# Patient Record
Sex: Female | Born: 1955 | Race: White | Hispanic: No | Marital: Married | Smoking: Never smoker
Health system: Southern US, Community
[De-identification: ages and names within clinical notes are randomized; demographics above are authoritative.]

## PROBLEM LIST (undated history)

## (undated) DIAGNOSIS — D333 Benign neoplasm of cranial nerves: Secondary | ICD-10-CM

## (undated) DIAGNOSIS — K219 Gastro-esophageal reflux disease without esophagitis: Secondary | ICD-10-CM

## (undated) DIAGNOSIS — F32A Depression, unspecified: Secondary | ICD-10-CM

## (undated) DIAGNOSIS — K5900 Constipation, unspecified: Secondary | ICD-10-CM

## (undated) DIAGNOSIS — I10 Essential (primary) hypertension: Secondary | ICD-10-CM

## (undated) DIAGNOSIS — F329 Major depressive disorder, single episode, unspecified: Secondary | ICD-10-CM

## (undated) DIAGNOSIS — R1310 Dysphagia, unspecified: Secondary | ICD-10-CM

## (undated) DIAGNOSIS — T7840XA Allergy, unspecified, initial encounter: Secondary | ICD-10-CM

## (undated) DIAGNOSIS — N979 Female infertility, unspecified: Secondary | ICD-10-CM

## (undated) DIAGNOSIS — D352 Benign neoplasm of pituitary gland: Secondary | ICD-10-CM

## (undated) DIAGNOSIS — M549 Dorsalgia, unspecified: Secondary | ICD-10-CM

## (undated) DIAGNOSIS — E785 Hyperlipidemia, unspecified: Secondary | ICD-10-CM

## (undated) DIAGNOSIS — E559 Vitamin D deficiency, unspecified: Secondary | ICD-10-CM

## (undated) DIAGNOSIS — F419 Anxiety disorder, unspecified: Secondary | ICD-10-CM

## (undated) DIAGNOSIS — M2550 Pain in unspecified joint: Secondary | ICD-10-CM

## (undated) HISTORY — DX: Hyperlipidemia, unspecified: E78.5

## (undated) HISTORY — DX: Essential (primary) hypertension: I10

## (undated) HISTORY — DX: Depression, unspecified: F32.A

## (undated) HISTORY — DX: Gastro-esophageal reflux disease without esophagitis: K21.9

## (undated) HISTORY — DX: Dysphagia, unspecified: R13.10

## (undated) HISTORY — DX: Major depressive disorder, single episode, unspecified: F32.9

## (undated) HISTORY — DX: Benign neoplasm of cranial nerves: D33.3

## (undated) HISTORY — DX: Female infertility, unspecified: N97.9

## (undated) HISTORY — DX: Anxiety disorder, unspecified: F41.9

## (undated) HISTORY — DX: Dorsalgia, unspecified: M54.9

## (undated) HISTORY — DX: Constipation, unspecified: K59.00

## (undated) HISTORY — DX: Allergy, unspecified, initial encounter: T78.40XA

## (undated) HISTORY — DX: Pain in unspecified joint: M25.50

## (undated) HISTORY — DX: Vitamin D deficiency, unspecified: E55.9

## (undated) HISTORY — DX: Benign neoplasm of pituitary gland: D35.2

---

## 1983-11-17 HISTORY — PX: BRAIN SURGERY: SHX531

## 2003-05-02 ENCOUNTER — Other Ambulatory Visit: Admission: RE | Admit: 2003-05-02 | Discharge: 2003-05-02 | Payer: Self-pay | Admitting: Family Medicine

## 2003-05-04 ENCOUNTER — Encounter: Payer: Self-pay | Admitting: Family Medicine

## 2003-05-04 ENCOUNTER — Encounter: Admission: RE | Admit: 2003-05-04 | Discharge: 2003-05-04 | Payer: Self-pay | Admitting: Family Medicine

## 2003-05-29 ENCOUNTER — Encounter: Payer: Self-pay | Admitting: Family Medicine

## 2003-05-29 ENCOUNTER — Ambulatory Visit (HOSPITAL_COMMUNITY): Admission: RE | Admit: 2003-05-29 | Discharge: 2003-05-29 | Payer: Self-pay | Admitting: Family Medicine

## 2005-03-23 ENCOUNTER — Ambulatory Visit: Payer: Self-pay | Admitting: Family Medicine

## 2005-03-25 ENCOUNTER — Ambulatory Visit: Payer: Self-pay | Admitting: Family Medicine

## 2005-04-03 ENCOUNTER — Ambulatory Visit: Payer: Self-pay | Admitting: *Deleted

## 2005-04-03 ENCOUNTER — Observation Stay (HOSPITAL_COMMUNITY): Admission: AD | Admit: 2005-04-03 | Discharge: 2005-04-04 | Payer: Self-pay | Admitting: Family Medicine

## 2005-04-03 ENCOUNTER — Ambulatory Visit: Payer: Self-pay | Admitting: Internal Medicine

## 2005-04-03 ENCOUNTER — Ambulatory Visit: Payer: Self-pay | Admitting: Family Medicine

## 2005-04-07 ENCOUNTER — Emergency Department (HOSPITAL_COMMUNITY): Admission: EM | Admit: 2005-04-07 | Discharge: 2005-04-07 | Payer: Self-pay | Admitting: Emergency Medicine

## 2005-04-09 ENCOUNTER — Ambulatory Visit: Payer: Self-pay

## 2005-04-14 ENCOUNTER — Ambulatory Visit: Payer: Self-pay | Admitting: Family Medicine

## 2005-04-28 ENCOUNTER — Ambulatory Visit: Payer: Self-pay

## 2005-07-29 ENCOUNTER — Ambulatory Visit: Payer: Self-pay | Admitting: Family Medicine

## 2005-10-26 ENCOUNTER — Ambulatory Visit: Payer: Self-pay | Admitting: Family Medicine

## 2005-12-23 ENCOUNTER — Ambulatory Visit: Payer: Self-pay | Admitting: Family Medicine

## 2006-07-20 ENCOUNTER — Ambulatory Visit: Payer: Self-pay | Admitting: Family Medicine

## 2007-03-08 DIAGNOSIS — D352 Benign neoplasm of pituitary gland: Secondary | ICD-10-CM

## 2007-03-08 DIAGNOSIS — I10 Essential (primary) hypertension: Secondary | ICD-10-CM | POA: Insufficient documentation

## 2007-03-08 DIAGNOSIS — D353 Benign neoplasm of craniopharyngeal duct: Secondary | ICD-10-CM

## 2007-05-11 ENCOUNTER — Ambulatory Visit: Payer: Self-pay | Admitting: Family Medicine

## 2007-05-11 ENCOUNTER — Ambulatory Visit: Payer: Self-pay | Admitting: Internal Medicine

## 2007-05-11 DIAGNOSIS — R109 Unspecified abdominal pain: Secondary | ICD-10-CM | POA: Insufficient documentation

## 2007-05-13 ENCOUNTER — Encounter (INDEPENDENT_AMBULATORY_CARE_PROVIDER_SITE_OTHER): Payer: Self-pay | Admitting: *Deleted

## 2007-05-13 LAB — CONVERTED CEMR LAB
ALT: 16 units/L (ref 0–35)
AST: 19 units/L (ref 0–37)
Basophils Relative: 0.1 % (ref 0.0–1.0)
Bilirubin, Direct: 0.1 mg/dL (ref 0.0–0.3)
CO2: 29 meq/L (ref 19–32)
Calcium: 9 mg/dL (ref 8.4–10.5)
Chloride: 100 meq/L (ref 96–112)
Eosinophils Relative: 1.2 % (ref 0.0–5.0)
GFR calc non Af Amer: 70 mL/min
Glucose, Bld: 86 mg/dL (ref 70–99)
Platelets: 221 10*3/uL (ref 150–400)
RBC: 4.45 M/uL (ref 3.87–5.11)
Total Protein: 6.5 g/dL (ref 6.0–8.3)
WBC: 5.5 10*3/uL (ref 4.5–10.5)

## 2007-06-07 ENCOUNTER — Telehealth: Payer: Self-pay | Admitting: Family Medicine

## 2007-12-12 ENCOUNTER — Ambulatory Visit: Payer: Self-pay | Admitting: Internal Medicine

## 2007-12-13 ENCOUNTER — Encounter: Payer: Self-pay | Admitting: Internal Medicine

## 2007-12-16 ENCOUNTER — Telehealth (INDEPENDENT_AMBULATORY_CARE_PROVIDER_SITE_OTHER): Payer: Self-pay | Admitting: *Deleted

## 2007-12-16 LAB — CONVERTED CEMR LAB
BUN: 12 mg/dL (ref 6–23)
Basophils Absolute: 0 10*3/uL (ref 0.0–0.1)
Creatinine, Ser: 0.9 mg/dL (ref 0.4–1.2)
Eosinophils Absolute: 0 10*3/uL (ref 0.0–0.6)
GFR calc Af Amer: 84 mL/min
GFR calc non Af Amer: 70 mL/min
Hemoglobin: 11.9 g/dL — ABNORMAL LOW (ref 12.0–15.0)
Iron: 52 ug/dL (ref 42–145)
MCHC: 32.2 g/dL (ref 30.0–36.0)
MCV: 78 fL (ref 78.0–100.0)
Monocytes Absolute: 0.4 10*3/uL (ref 0.2–0.7)
Monocytes Relative: 5.1 % (ref 3.0–11.0)
Potassium: 3.4 meq/L — ABNORMAL LOW (ref 3.5–5.1)
RDW: 14.3 % (ref 11.5–14.6)
Transferrin: 253.9 mg/dL (ref >=212.0)

## 2007-12-29 ENCOUNTER — Ambulatory Visit: Payer: Self-pay | Admitting: Family Medicine

## 2008-01-02 ENCOUNTER — Encounter (INDEPENDENT_AMBULATORY_CARE_PROVIDER_SITE_OTHER): Payer: Self-pay | Admitting: *Deleted

## 2008-01-02 LAB — CONVERTED CEMR LAB
CO2: 32 meq/L (ref 19–32)
Chloride: 101 meq/L (ref 96–112)
Creatinine, Ser: 0.8 mg/dL (ref 0.4–1.2)
Glucose, Bld: 93 mg/dL (ref 70–99)
Potassium: 4.1 meq/L (ref 3.5–5.1)
Sodium: 139 meq/L (ref 135–145)

## 2008-01-12 ENCOUNTER — Ambulatory Visit: Payer: Self-pay | Admitting: Family Medicine

## 2008-01-19 ENCOUNTER — Telehealth (INDEPENDENT_AMBULATORY_CARE_PROVIDER_SITE_OTHER): Payer: Self-pay | Admitting: *Deleted

## 2008-01-19 LAB — CONVERTED CEMR LAB
CO2: 30 meq/L (ref 19–32)
Chloride: 103 meq/L (ref 96–112)
Creatinine, Ser: 0.8 mg/dL (ref 0.4–1.2)
Potassium: 3.4 meq/L — ABNORMAL LOW (ref 3.5–5.1)
Sodium: 138 meq/L (ref 135–145)

## 2008-03-21 ENCOUNTER — Encounter: Payer: Self-pay | Admitting: Family Medicine

## 2008-06-13 ENCOUNTER — Ambulatory Visit: Payer: Self-pay | Admitting: Family Medicine

## 2008-06-13 DIAGNOSIS — H1045 Other chronic allergic conjunctivitis: Secondary | ICD-10-CM | POA: Insufficient documentation

## 2008-06-14 ENCOUNTER — Encounter (INDEPENDENT_AMBULATORY_CARE_PROVIDER_SITE_OTHER): Payer: Self-pay | Admitting: *Deleted

## 2008-06-14 LAB — CONVERTED CEMR LAB
BUN: 14 mg/dL (ref 6–23)
CO2: 30 meq/L (ref 19–32)
Calcium: 9.5 mg/dL (ref 8.4–10.5)
GFR calc Af Amer: 84 mL/min
Glucose, Bld: 91 mg/dL (ref 70–99)

## 2009-06-17 ENCOUNTER — Telehealth (INDEPENDENT_AMBULATORY_CARE_PROVIDER_SITE_OTHER): Payer: Self-pay | Admitting: *Deleted

## 2009-08-13 ENCOUNTER — Ambulatory Visit: Payer: Self-pay | Admitting: Family Medicine

## 2009-08-13 DIAGNOSIS — R42 Dizziness and giddiness: Secondary | ICD-10-CM

## 2009-08-13 DIAGNOSIS — J069 Acute upper respiratory infection, unspecified: Secondary | ICD-10-CM | POA: Insufficient documentation

## 2009-08-13 DIAGNOSIS — L723 Sebaceous cyst: Secondary | ICD-10-CM

## 2009-08-14 ENCOUNTER — Telehealth (INDEPENDENT_AMBULATORY_CARE_PROVIDER_SITE_OTHER): Payer: Self-pay | Admitting: *Deleted

## 2009-08-15 ENCOUNTER — Encounter: Admission: RE | Admit: 2009-08-15 | Discharge: 2009-08-15 | Payer: Self-pay | Admitting: Family Medicine

## 2009-08-19 ENCOUNTER — Encounter: Payer: Self-pay | Admitting: Family Medicine

## 2009-08-19 ENCOUNTER — Telehealth (INDEPENDENT_AMBULATORY_CARE_PROVIDER_SITE_OTHER): Payer: Self-pay | Admitting: *Deleted

## 2009-08-29 ENCOUNTER — Encounter: Payer: Self-pay | Admitting: Family Medicine

## 2009-09-17 ENCOUNTER — Encounter: Payer: Self-pay | Admitting: Family Medicine

## 2009-09-26 ENCOUNTER — Encounter: Payer: Self-pay | Admitting: Family Medicine

## 2009-10-15 ENCOUNTER — Encounter: Payer: Self-pay | Admitting: Family Medicine

## 2010-06-19 ENCOUNTER — Telehealth: Payer: Self-pay | Admitting: Family Medicine

## 2010-06-19 ENCOUNTER — Encounter: Payer: Self-pay | Admitting: Family Medicine

## 2010-06-25 ENCOUNTER — Telehealth (INDEPENDENT_AMBULATORY_CARE_PROVIDER_SITE_OTHER): Payer: Self-pay | Admitting: *Deleted

## 2010-06-25 ENCOUNTER — Ambulatory Visit: Payer: Self-pay | Admitting: Family Medicine

## 2010-06-25 DIAGNOSIS — M549 Dorsalgia, unspecified: Secondary | ICD-10-CM | POA: Insufficient documentation

## 2010-07-16 ENCOUNTER — Ambulatory Visit: Payer: Self-pay | Admitting: Family Medicine

## 2010-07-16 DIAGNOSIS — H6500 Acute serous otitis media, unspecified ear: Secondary | ICD-10-CM | POA: Insufficient documentation

## 2010-07-25 ENCOUNTER — Encounter: Payer: Self-pay | Admitting: Family Medicine

## 2010-08-13 ENCOUNTER — Emergency Department (HOSPITAL_COMMUNITY): Admission: EM | Admit: 2010-08-13 | Discharge: 2010-08-13 | Payer: Self-pay | Admitting: Emergency Medicine

## 2010-08-13 ENCOUNTER — Telehealth: Payer: Self-pay | Admitting: Family Medicine

## 2010-10-27 ENCOUNTER — Encounter: Payer: Self-pay | Admitting: Family Medicine

## 2010-12-14 LAB — CONVERTED CEMR LAB
ALT: 14 units/L (ref 0–35)
AST: 16 units/L (ref 0–37)
Alkaline Phosphatase: 61 units/L (ref 39–117)
BUN: 17 mg/dL (ref 6–23)
Basophils Absolute: 0 10*3/uL (ref 0.0–0.1)
Bilirubin, Direct: 0 mg/dL (ref 0.0–0.3)
Calcium: 9.3 mg/dL (ref 8.4–10.5)
Eosinophils Relative: 1.2 % (ref 0.0–5.0)
Folate: 10.2 ng/mL
GFR calc non Af Amer: 79.2 mL/min (ref >60)
Glucose, Bld: 105 mg/dL — ABNORMAL HIGH (ref 70–99)
HCT: 36.5 % (ref 36.0–46.0)
HDL: 35.1 mg/dL — ABNORMAL LOW (ref >39.00)
Lymphocytes Relative: 20.1 % (ref 12.0–46.0)
Monocytes Relative: 4.9 % (ref 3.0–12.0)
Platelets: 188 10*3/uL (ref 150.0–400.0)
Potassium: 3.2 meq/L — ABNORMAL LOW (ref 3.5–5.1)
RDW: 14.1 % (ref 11.5–14.6)
Sodium: 143 meq/L (ref 135–145)
Total Bilirubin: 0.5 mg/dL (ref 0.3–1.2)
Triglycerides: 285 mg/dL — ABNORMAL HIGH (ref 0.0–149.0)
VLDL: 57 mg/dL — ABNORMAL HIGH (ref 0.0–40.0)
Vitamin B-12: 218 pg/mL (ref 211–911)
WBC: 7.2 10*3/uL (ref 4.5–10.5)

## 2010-12-16 NOTE — Progress Notes (Signed)
Summary: REFILL REQUEST  Phone Note Refill Request Message from:  Pharmacy on June 25, 2010 9:51 AM  Refills Requested: Medication #1:  ULTRAM 50 MG TABS 1 by mouth q 6 hrn as needed.   Dosage confirmed as above?Dosage Confirmed   Supply Requested: 1 month   Last Refilled: 06/19/[facility] [address]  Next Appointment Scheduled: 07/01/10 Initial call taken by: Lavell Islam,  June 25, 2010 9:52 AM  Follow-up for Phone Call        LAST FILLED 06-19-10 #20, LAST ov 08-13-09................Marland KitchenFelecia Deloach CMA  June 25, 2010 12:15 PM   Additional Follow-up for Phone Call Additional follow up Details #1::        last refilled on 8/4 Additional Follow-up by: Loreen Freud DO,  June 25, 2010 12:19 PM    Additional Follow-up for Phone Call Additional follow up Details #2::    PER DR LOWNE PT WILL NEED TO BE SEEN FOR ANY ADDITIONAL REFILLS OF MED. PT AWARE.............Marland KitchenFelecia Deloach CMA  June 25, 2010 1:41 PM

## 2010-12-16 NOTE — Assessment & Plan Note (Signed)
Summary: BACK PAIN   Vital Signs:  Patient profile:   55 year old female Weight:      197 pounds Pulse rate:   84 / minute BP sitting:   130 / 86  (left arm)  Vitals Entered By: Almeta Monas CMA Duncan Dull) (June 25, 2010 2:59 PM) CC: pt c/o 7/10 sharp lbp that radiates down the (r) leg x1week   History of Present Illness: 55 yo woman here today for LBP.  sxs started 1 week ago.  'it feels like i have a herniated disc.  like somebody is taking my R leg and trying to rip it out of my hip joint'.  had MRI through chiropracter last week.  MRI results now in- L4/L5, S1/S2 degenerative changes w/ some disc involvement (pt forgets exact wording and did not bring report).  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  has appt w/ neurosurg pending.  pain is radiating down R buttock and upper thigh.  Current Medications (verified): 1)  Bromocriptine Mesylate 2.5 Mg Tabs (Bromocriptine Mesylate) .[name].. 1 By Mouth Qd 2)  Hydrochlorothiazide 25 Mg  Tabs (Hydrochlorothiazide) .[name].. 1 Once Daily 3)  Pepcid Otc .[name].. 1 Qd 4)  Lisinopril 10 Mg  Tabs (Lisinopril) .... 1/2  By Mouth Once Daily 5)  Klor-Con 20 Meq Pack (Potassium Chloride) .[name].. 1 By Mouth Once Daily 6)  Ultram 50 Mg Tabs (Tramadol Hcl) .[name].. 1 By Mouth Q 6 Hrn As Needed  Allergies (verified): No Known Drug Allergies  Review of Systems      See HPI  Physical Exam  General:  obviously uncomfortable Msk:  + TTP over lumbar spine good flexion, difficulty w/ extension (-) SLR bilaterally Extremities:  no C/C/E Neurologic:  shuffling, ataxic gait.   Impression & Recommendations:  Problem # 1:  BACK PAIN (ICD-724.5) Assessment New  pt's pain is likely disc related.  MRI done but no results available.  continue Ultram, add prednisone to decrease inflammation.  pt has upcoming appt w/ neurosurg.  offered vicodin, pt declined.  reviewed supportive care and red flags that should prompt return.  Pt  expresses understanding and is in agreement w/ this plan. Her updated medication list for this problem includes:    Ultram 50 Mg Tabs (Tramadol hcl) .[name]... 1 by mouth q 6 hrn as needed  Orders: Prescription Created Electronically [phone])  Complete Medication List: 1)  Bromocriptine Mesylate 2.5 Mg Tabs (Bromocriptine mesylate) .[name].. 1 by mouth qd 2)  Hydrochlorothiazide 25 Mg Tabs (Hydrochlorothiazide) .[name].. 1 once daily 3)  Pepcid Otc  .[name].. 1 qd 4)  Lisinopril 10 Mg Tabs (Lisinopril) .... 1/2  by mouth once daily 5)  Klor-con 20 Meq Pack (Potassium chloride) .[name].. 1 by mouth once daily 6)  Ultram 50 Mg Tabs (Tramadol hcl) .[name].. 1 by mouth q 6 hrn as needed 7)  Prednisone (pak) 10 Mg Tabs (Prednisone) .... Take as directed.  12 day pack  Patient Instructions: 1)  Please have a copy of the MRI and neurosurgery reports sent so we can follow along 2)  Take the Prednisone as directed- take w/ food to avoid upset stomach.  Avoid any additional ibuprofen, motrin, aleve.  You can add tylenol as needed. 3)  Use the tramadol as needed. 4)  Call with any questions or concerns 5)  Hang in there!! Prescriptions: ULTRAM 50 MG TABS (TRAMADOL HCL) 1 by mouth q 6 hrn as needed  #45 x 1   Entered and Authorized  by:   Neena Rhymes MD   Signed by:   Neena Rhymes MD on 06/25/2010   Method used:   [facility] to        [facility] [phone]* (retail)       [address]       [place], [place]  [address]       Ph: [phone] or [phone]       Fax: [phone]   RxID:   [id number] PREDNISONE (PAK) 10 MG TABS (PREDNISONE) take as directed.  12 day pack  #1 pack x 0   Entered and Authorized by:   Neena Rhymes MD   Signed by:   Neena Rhymes MD on 06/25/2010   Method used:   Electronically to        [facility] [phone]* (retail)       [address]       [place], [place]  [address]       Ph: [phone] or [phone]       Fax: [phone]   RxID:   [id number]

## 2010-12-16 NOTE — Progress Notes (Signed)
Summary: FYI-concerned about stroke-going to er  Phone Note Call from Patient   Caller: Patient Summary of Call: Spoke w/ patient husband says that she woke up this morning w/ L side facial weakness and that he was concerned about either Bell's Palsy or mini stroke informed husband that wife needs to go to emergency room asap to be evaluated to r/o stroke he agreed and will take patient to emergency room. Initial call taken by: Doristine Devoid CMA,  August 13, 2010 9:09 AM  Follow-up for Phone Call        please check on patient later today  Follow-up by: Loreen Freud DO,  August 13, 2010 11:32 AM  Additional Follow-up for Phone Call Additional follow up Details #1::        spoke w/ patient says she is doing fine she went to emergency room and they r/o stroke so Bell's palsy was what they suspect...Marland KitchenMarland KitchenDoristine Devoid CMA  August 13, 2010 4:40 PM

## 2010-12-16 NOTE — Assessment & Plan Note (Signed)
Summary: hearing loss/cbs   Vital Signs:  Patient profile:   55 year old female Height:      64 inches Weight:      194.4 pounds BMI:     33.49 Temp:     99.2 degrees F oral Pulse rate:   84 / minute Pulse rhythm:   regular BP sitting:   140 / 88  (left arm)  Vitals Entered By: Almeta Monas CMA Duncan Dull) (July 16, 2010 11:04 AM) CC: c/o ringing and hearing loss in the right ear   History of Present Illness: Pt here c/o decreased hearing in L ear.  It started in May and she thought it was related to congestion but it has not gotten better.  She still c/o congestion in L nostril and scratchy throat.  Pt was given pred pack for back pain and it helped a lot but when school started pain acted up again.  She has appointment with N/S next week but would like to repeat pred to see if it helps.    Current Medications (verified): 1)  Bromocriptine Mesylate 2.5 Mg Tabs (Bromocriptine Mesylate) .[name].. 1 By Mouth Qd 2)  Hydrochlorothiazide 25 Mg  Tabs (Hydrochlorothiazide) .[name].. 1 Once Daily 3)  Pepcid Otc .[name].. 1 Qd 4)  Lisinopril 10 Mg  Tabs (Lisinopril) .... 1/2  By Mouth Once Daily 5)  Klor-Con 20 Meq Pack (Potassium Chloride) .[name].. 1 By Mouth Once Daily 6)  Ultram 50 Mg Tabs (Tramadol Hcl) .[name].. 1 By Mouth Q 6 Hrn As Needed 7)  Prednisone (Pak) 10 Mg Tabs (Prednisone) .... Take As Directed.  12 Day Pack  Allergies (verified): No Known Drug Allergies  Physical Exam  General:  Well-developed,well-nourished,in no acute distress; alert,appropriate and cooperative throughout examination Ears:  L ear--+ fluid, dull TM Nose:  External nasal examination shows no deformity or inflammation. Nasal mucosa are pink and moist without lesions or exudates. Mouth:  pharyngeal erythema and postnasal drip.   Neck:  No deformities, masses, or tenderness noted. Psych:  Cognition and judgment appear intact. Alert and cooperative with normal attention span and concentration. No apparent delusions, illusions,  hallucinations   Impression & Recommendations:  Problem # 1:  OTITIS MEDIA, SEROUS, ACUTE, LEFT (ICD-381.01) prednisone taper veramyst  claritin refer to ENT if no better  Problem # 2:  BACK PAIN (ICD-724.5) repeat prednisone taper f/u neurosurgery Her updated medication list for this problem includes:    Ultram 50 Mg Tabs (Tramadol hcl) .[name]... 1 by mouth q 6 hrn as needed  Complete Medication List: 1)  Bromocriptine Mesylate 2.5 Mg Tabs (Bromocriptine mesylate) .[name].. 1 by mouth qd 2)  Hydrochlorothiazide 25 Mg Tabs (Hydrochlorothiazide) .[name].. 1 once daily 3)  Pepcid Otc  .[name].. 1 qd 4)  Lisinopril 10 Mg Tabs (Lisinopril) .... 1/2  by mouth once daily 5)  Klor-con 20 Meq Pack (Potassium chloride) .[name].. 1 by mouth once daily 6)  Ultram 50 Mg Tabs (Tramadol hcl) .[name].. 1 by mouth q 6 hrn as needed 7)  Prednisone (pak) 10 Mg Tabs (Prednisone) .... Take as directed.  12 day pack 8)  Veramyst 27.5 Mcg/spray Susp (Fluticasone furoate) .... 2 sprays each nostril once daily 9)  Claritin 10 Mg Tabs (Loratadine) .[name].. 1 by mouth once daily as needed  Patient Instructions: 1)  use veramyst and claritin for at least 2 weeks---call us if symptoms do not resolve and we will refer for ENT evaluation Prescriptions: CLARITIN 10 MG TABS (LORATADINE) 1 by mouth once daily as needed  #  30 x 5   Entered and Authorized by:   Loreen Freud DO   Signed by:   Loreen Freud DO on 07/16/2010   Method used:   Electronically to        [facility] [phone]* (retail)       [address]       [place], [place]  [address]       Ph: [phone] or [phone]       Fax: [phone]   RxID:   [id number] PREDNISONE (PAK) 10 MG TABS (PREDNISONE) take as directed.  12 day pack  #1 pack x 0   Entered and Authorized by:   Loreen Freud DO   Signed by:   Loreen Freud DO on 07/16/2010   Method used:   Electronically to        [facility] [phone]* (retail)       [address]       [place], [place]  [address]       Ph:  [phone] or [phone]       Fax: [phone]   RxID:   [id number]

## 2010-12-16 NOTE — Progress Notes (Signed)
Summary: RX request--back pain  Phone Note Call from Patient Call back at [phone]   Summary of Call: Patient called stating that she is seeing a chiropractor and was sent for an MRI and could not complete it b/c she could not lay still due to pain. Patient r/s the MRI to 2pm today and would like [facility]. Please advise. Initial call taken by: Lucious Groves CMA,  June 19, 2010 8:49 AM  Follow-up for Phone Call        ultram 50 mg 1 by mouth every 6 hours as needed  #20 Follow-up by: Loreen Freud DO,  June 19, 2010 10:52 AM  Additional Follow-up for Phone Call Additional follow up Details #1::        Patient notified. Additional Follow-up by: Lucious Groves CMA,  June 19, 2010 11:53 AM    New/Updated Medications: ULTRAM 50 MG TABS (TRAMADOL HCL) 1 by mouth q 6 hrn as needed Prescriptions: ULTRAM 50 MG TABS (TRAMADOL HCL) 1 by mouth q 6 hrn as needed  #20 x 0   Entered by:   Lucious Groves CMA   Authorized by:   Loreen Freud DO   Signed by:   Lucious Groves CMA on 06/19/2010   Method used:   Telephoned to ...       [facility] [phone]* (retail)       [address]       [place], [place]  [address]       Ph: [phone] or [phone]       Fax: [phone]   RxID:   [id number] ULTRAM 50 MG TABS (TRAMADOL HCL) 1 by mouth q 6 hrn as needed  #20 x 0   Entered by:   Lucious Groves CMA   Authorized by:   Loreen Freud DO   Signed by:   Lucious Groves CMA on 06/19/2010   Method used:   Historical   RxID:   [id number]

## 2010-12-16 NOTE — Letter (Signed)
Summary: [facility]  [facility]   Imported By: Lanelle Bal 08/08/2010 14:21:36  _____________________________________________________________________  External Attachment:    Type:   Image     Comment:   External Document

## 2010-12-18 NOTE — Letter (Signed)
Summary: Primary Care Appointment Letter  [facility]  [address] [place], [place] [address]   Phone: [phone]  Fax: [phone]       10/27/2010 MRN: [id number]  [facility] [address] [place], [place]  [address]       Dear Ms. Noon,   Your Primary Care Physician Loreen Freud DO has indicated that:    ___X____it is time to schedule an appointment for you annual physical.    _______you missed your appointment on______ and need to call and          reschedule.    __X_____you need to have lab work done/Fasting.    _______you need to schedule an appointment discuss lab or test results.    _______you need to call to reschedule your appointment that is                       scheduled on _________.     Please call our office as soon as possible. Our phone number is [phone]          _________. Please press option 1. Our office is open 8a-12noon and 1p-5p, Monday through Friday.     Thank you,    [facility] Scheduler

## 2011-02-10 ENCOUNTER — Encounter: Payer: Self-pay | Admitting: Family Medicine

## 2011-02-12 ENCOUNTER — Ambulatory Visit (INDEPENDENT_AMBULATORY_CARE_PROVIDER_SITE_OTHER): Payer: Medicare Other | Admitting: Family Medicine

## 2011-02-12 ENCOUNTER — Encounter: Payer: Self-pay | Admitting: Family Medicine

## 2011-02-12 DIAGNOSIS — F341 Dysthymic disorder: Secondary | ICD-10-CM

## 2011-02-12 DIAGNOSIS — H9192 Unspecified hearing loss, left ear: Secondary | ICD-10-CM | POA: Insufficient documentation

## 2011-02-12 DIAGNOSIS — R21 Rash and other nonspecific skin eruption: Secondary | ICD-10-CM

## 2011-02-12 DIAGNOSIS — H9190 Unspecified hearing loss, unspecified ear: Secondary | ICD-10-CM

## 2011-02-12 DIAGNOSIS — F418 Other specified anxiety disorders: Secondary | ICD-10-CM | POA: Insufficient documentation

## 2011-02-12 MED ORDER — BROMOCRIPTINE MESYLATE 2.5 MG PO TABS: 2.5000 mg | ORAL_TABLET | Freq: Every day | ORAL | Status: DC

## 2011-02-12 MED ORDER — HYDROCHLOROTHIAZIDE 25 MG PO TABS: 25.0000 mg | ORAL_TABLET | Freq: Every day | ORAL | Status: DC

## 2011-02-12 MED ORDER — LISINOPRIL 10 MG PO TABS: 5.0000 mg | ORAL_TABLET | ORAL | Status: DC

## 2011-02-12 NOTE — Assessment & Plan Note (Signed)
Pt obviously under a lot of stress.  Tearful throughout most of visit.  Not interested in starting meds but open to idea of counseling.  Admits this has been a problem for 'years' but has never talked about it w/ anyone.  States 'it's getting harder to ignore'.  List of counseling providers and #s given.  Strongly encouraged her to call for appt

## 2011-02-12 NOTE — Assessment & Plan Note (Signed)
Appearance consistent w/ erythema nodosum but areas are not painful.  As the 'spots' are not bothersome to pt will not treat at this time.  Reviewed supportive care and red flags that should prompt return.  Pt expressed understanding and is in agreement w/ plan.[name]

## 2011-02-12 NOTE — Progress Notes (Signed)
  Subjective:    Patient ID: Kristin Delgado, female    DOB: [birth year], 55 y.o.   MRN: [id number]  HPI Hearing loss- L sided, first noticed last summer.  Initially thought she had water in her ear but sxs never improved.  There was free hearing test offered at [facility] on 1/31 which showed marked hearing loss on L- w/ hand written note indicating they were unable to get true response at low frequency.  Has been using ear buds but has to turn volume the 'whole way up' and often hears 'static and squeaks'.  Red bumps- forearms and lower legs.  First appeared 2-3 weeks ago.  Not painful/itching.  Will 'come and go'.  No one at home w/ similar sxs.  No lesions on trunk.  'can you have hives if they don't itch?'  No recent travel or exposure to new or different materials.  Nothing makes sxs better or worse.  Depression/anxiety- pt tearful in office today, reports most of her stress stems from financial issues 'which i can't do anything about'.  Reports this has been going on 'for years'.  Had ruptured disc in Aug 2011- has had difficult time w/ this.  Has never talked about depression previously.  Not open to idea of meds.  Open to idea of counseling.   Review of Systems For ROS see HPI     Objective:   Physical Exam  Constitutional: She appears well-developed and well-nourished.  HENT:  Head: Normocephalic and atraumatic.  Right Ear: Tympanic membrane and external ear normal.  Left Ear: Tympanic membrane and external ear normal. Decreased hearing is noted.  Nose: Nose normal.  Mouth/Throat: Oropharynx is clear and moist.  Eyes: Conjunctivae and EOM are normal. Pupils are equal, round, and reactive to light.  Neck: Normal range of motion. Neck supple.  Lymphadenopathy:    She has no cervical adenopathy.  Skin: Skin is warm and dry.       Faintly erythematous areas on forearms and lower legs bilaterally.  ? Nodularity.  Some blanching but not completely.  Non tender, non pruritic.    Psychiatric: Her speech is normal. Thought content normal. Her mood appears anxious. She is withdrawn. Cognition and memory are normal. She exhibits a depressed mood.       tearful          Assessment & Plan:

## 2011-02-12 NOTE — Patient Instructions (Signed)
Please schedule your complete physical w/ Dr Laury Axon in the next 6-12 weeks (do not eat before this appt) Someone will call you with your Audiology appt The red spots on your skin may be stress related- since they aren't bothering you we won't treat them, but call if they worsen or start to be symptomatic Consider calling and starting counseling- this would help the depression and anxiety Call with any questions or concerns Hang in there!

## 2011-02-12 NOTE — Assessment & Plan Note (Signed)
Given results obtained at [facility] agree that pt needs formal/extension audiologic testing.  Will refer.

## 2011-03-09 ENCOUNTER — Other Ambulatory Visit: Payer: Self-pay | Admitting: Otolaryngology

## 2011-03-12 ENCOUNTER — Ambulatory Visit
Admission: RE | Admit: 2011-03-12 | Discharge: 2011-03-12 | Disposition: A | Discharge status: Home / Self Care | Payer: Medicare Other | Source: Ambulatory Visit | Attending: Otolaryngology | Admitting: Otolaryngology

## 2011-03-12 MED ORDER — GADOBENATE DIMEGLUMINE 529 MG/ML IV SOLN
20.0000 mL | Freq: Once | INTRAVENOUS | Status: AC | PRN
  Administered 2011-03-12: 20 mL via INTRAVENOUS

## 2011-04-03 NOTE — H&P (Signed)
NAME:  Kristin Delgado, Kristin Delgado NO.:  [id number]   MEDICAL RECORD NO.:  [id number]          PATIENT TYPE:  INP   LOCATION:  4730                         FACILITY:  [facility]   PHYSICIAN:  Lelon Perla, M.D.  DATE OF BIRTH:  01-02-1956   DATE OF ADMISSION:  04/03/2005  DATE OF DISCHARGE:                                HISTORY & PHYSICAL   ADMITTING DIAGNOSES:  1.  Chest pain.  2.  Uncontrolled hypertension.  3.  Rule out myocardial infarction.   Patient is a 55 year old white female who presented to the office with  atypical chest pain and left arm and neck numbness since this morning.  States she was checking her blood pressure at home and has been very high.  She also complains of nausea.  No shortness of breath.  No diaphoresis.  Now  in the office pain seems to have subsided and she has had no headache or  mental status changes.   PAST MEDICAL HISTORY:  1.  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  2.  Hypertension that was diagnosed in May of this year.   FAMILY HISTORY:  Denies.   SOCIAL HISTORY:  Denies any smoking or alcohol.  She is married.   ALLERGIES:  No known drug allergies.   MEDICATIONS:  Only taking Parlodel 2.5 mg and HCTZ 25 mg once a day that was  started on Mar 26, 2005.   PHYSICAL EXAMINATION:  VITAL SIGNS:  Blood pressure left arm 200/100, right  arm 190/110, pulse 86, respirations 16.  She is afebrile.  GENERAL:  She is awake, alert, oriented, in no acute distress, but very  anxious.  HEENT:  Head is normocephalic, atraumatic.  Eyes:  Pupils are equal, round,  and reactive to light.  Tympanic membranes are intact.  Oropharynx is clear.  NECK:  Supple.  No JVD or bruits.  HEART:  Positive S1, S2 with a 1-2/6 systolic ejection murmur.  LUNGS:  Clear to auscultation bilaterally.  No rales, rhonchi, or wheezing.  ABDOMEN:  Soft, nontender.  EXTREMITIES:  No edema.  NEUROLOGIC:  No focal deficits.  Cranial nerves II-XII are  intact.  EKG  showed no acute changes and sinus rhythm.  Patient had echocardiogram May 15, 2003 showed normal LV function, mild AI, MR, and TR.   ASSESSMENT/PLAN:  1.  Chest pain.  Uncontrolled hypertension.  Rule out myocardial infarction.      Patient is being admitted for 24-hour      observation to rule out myocardial infarction.  Will check CPK x3.  Add      Toprol XL 50 mg a day and consult cardiology.  2.  History of pituitary tumor.  Patient is on Parlodel.  Will check a      prolactin level while she is in hospital.      YRL/MEDQ  D:  04/03/2005  T:  04/03/2005  Job:  [id number]

## 2011-04-03 NOTE — Consult Note (Signed)
NAME:  Kristin Delgado, Kristin Delgado NO.:  [id number]   MEDICAL RECORD NO.:  [id number]          PATIENT TYPE:  INP   LOCATION:  4730                         FACILITY:  [facility]   PHYSICIAN:  Rollene Rotunda, M.D.   DATE OF BIRTH:  03-24-1956   DATE OF CONSULTATION:  DATE OF DISCHARGE:                                   CONSULTATION   Audio too short to transcribe (less than 5 seconds)      JH/MEDQ  D:  04/03/2005  T:  04/03/2005  Job:  [id number]

## 2011-04-03 NOTE — Discharge Summary (Signed)
NAME:  Kristin Delgado, Kristin Delgado                ACCOUNT NO.:  [id number]   MEDICAL RECORD NO.:  [id number]          PATIENT TYPE:  INP   LOCATION:  4730                         FACILITY:  [facility]   PHYSICIAN:  Bruce Rexene Edison. Swords, M.D. [facility] OF BIRTH:  [birth year]   DATE OF ADMISSION:  04/03/2005  DATE OF DISCHARGE:  04/04/2005                                 DISCHARGE SUMMARY   DISCHARGE DIAGNOSES:  1.  Atypical chest pain.  2.  Pituitary tumor (__________).  3.  Hypertension.   DISCHARGE MEDICATIONS:  1.  _______________2.5 mg p.o. daily.  2.  Hydrochlorothiazide 25 mg p.o. daily.   CONDITION ON DISCHARGE:  Improved.   FOLLOWUP:  He will see Dr. Laury Axon in one to two weeks.  She will be scheduled  for a stress Myoview.   HOSPITAL COURSE:  The patient was admitted to the hospitalist service with  atypical chest pain.  The patient was seen in consultation by cardiology.  All of her cardiac enzymes were negative, including a troponin on the day of  discharge.  The patient was seen by cardiologist who recommended a stress  Myoview that will be done as an outpatient.       BHS/MEDQ  D:  04/04/2005  T:  04/04/2005  Job:  [id number]   cc:   Salvadore Farber, M.D. [facility]  [address]  [address]  [place]  [place] [address]   Loreen Freud, M.D.

## 2011-04-03 NOTE — Consult Note (Signed)
NAME:  Kristin Delgado, BIN NO.:  [id number]   MEDICAL RECORD NO.:  [id number]          PATIENT TYPE:  INP   LOCATION:  4730                         FACILITY:  [facility]   PHYSICIAN:  Rollene Rotunda, M.D.   DATE OF BIRTH:  [birth year]   DATE OF CONSULTATION:  DATE OF DISCHARGE:                                   CONSULTATION   DATE OF CONSULTATION:  Apr 03, 2005.   REASON FOR CONSULTATION:  Evaluate patient with neck discomfort.   HISTORY OF PRESENT ILLNESS:  The patient is a pleasant 55 year old without  history of coronary disease.  She does report a stress perfusion study a  couple of years ago, which she reports as normal.  She has recently  diagnosed hypertension.  Today, after running some errands, she was lying on  the couch.  She developed some discomfort in her throat that she thought  might have been a sore throat.  However, she then developed some tingling in  her jaw.  This was moderate and annoying.  She said she felt like a lump  in her throat.  She did have some bilateral shoulder aching, but says this  is a chronic problem.  She also had some left elbow discomfort, but again  says this is a chronic problem.  Her face felt hot.  She had some numbness  in the palm of her hand.  She presented to Dr. Laury Axon after a couple of hours  of this.  She was noted to have a blood pressure of 200/100 and was now  admitted.   The patient reports that she is active doing household chores,z so she does  not exercise.  With it, she denies any chest discomfort, neck discomfort,  arm discomfort, activity, nausea, vomiting, or excessive diaphoresis.  She  does not have any palpitations, presyncope, or syncope.  She denies any PND  or orthopnea.   PAST MEDICAL HISTORY:  1.  Hypertension recently diagnosed.  2.  Pituitary adenoma.   PAST SURGICAL HISTORY:  Resection of adenoma.   ALLERGIES:  None.   MEDICATIONS:  1.  Hydrochlorothiazide 20 mg daily.  2.  Parlodel 2.5 mg  daily taken with Tums.   SOCIAL HISTORY:  The patient lives in [place] with her husband.  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  She has three children.  She has never smoked cigarettes and  does not drink alcohol.   FAMILY HISTORY:  Noncontributory for early coronary artery disease.   REVIEW OF SYSTEMS:  Positive for mild lower extremity swelling,  gastroesophageal reflux symptoms.  Otherwise, negative for all other  systems.   PHYSICAL EXAMINATION:  GENERAL:  The patient is in no distress.  VITAL SIGNS:  Blood pressure 143/115, heart rate 72, afebrile.  HEENT:  Eyelids unremarkable.  Pupils equal, round, and reactive  to light.  Fundi not visualized.  Oral mucosa unremarkable.  NECK:  No jugulovenous distention.  Wave form within normal limits.  Carotid  upstroke brisk and symmetric with no bruits, no thyromegaly.  LYMPHATICS:  No cervical, axillary, or inguinal adenopathy.  LUNGS:  Clear to  auscultation bilaterally.  BACK:  No costovertebral angle tenderness.  CHEST:  Unremarkable.  HEART:  S1 and S2 within normal limits.  No S3, no S4, no murmurs.  ABDOMEN:  Obese, positive bowel sounds normal in frequency and pitch, no  bruits, rebound, guarding, no midline pulsatile masses, no hepatomegaly, no  splenomegaly.  SKIN:  No rashes, no nodules.  EXTREMITIES:  2+ pulses throughout, no edema, no cyanosis, no clubbing.  NEURO:  Oriented to person, place, and time, cranial nerves II-XII grossly  intact, motor grossly intact.   ELECTROCARDIOGRAM:  Sinus rhythm, rate 75, axis within normal limits,  intervals within normal limits, poor anterior R-wave progression, no acute  ST T-wave changes.   ASSESSMENT AND PLAN:  1.  Throat/chin discomfort:  This sounds atypical for angina.  She reports a      negative stress perfusion study a couple of years ago.  She does have      hypertension as a risk factor, but no others.  Her physical exam is      unremarkable.  Her EKG is unremarkable.  Labs are  pending.  Based on all      of this, I think the pre-test probability of obstructive coronary      disease causing these symptoms is low.  I think she could be ruled out      for myocardial infarction.  I think an outpatient stress test would be      indicated.  2.  Risk reduction:  She will have a lipid profile.  3.  Hypertension:  The patient will be started on Toprol and get her first      dose today.  She will continue on her hydrochlorothiazide.  Medications      will be adjusted based on her response to this.  4.  Obesity:  She will be counseled about the need to lose weight with diet      and exercise.      JH/MEDQ  D:  04/03/2005  T:  04/04/2005  Job:  [id number]

## 2011-04-29 ENCOUNTER — Other Ambulatory Visit: Payer: Self-pay | Admitting: Family Medicine

## 2011-06-10 ENCOUNTER — Ambulatory Visit (INDEPENDENT_AMBULATORY_CARE_PROVIDER_SITE_OTHER): Payer: Medicare Other | Admitting: Family Medicine

## 2011-06-10 ENCOUNTER — Encounter: Payer: Self-pay | Admitting: Family Medicine

## 2011-06-10 VITALS — BP 122/70 | HR 58 | Temp 98.6°F | Ht 64.0 in | Wt 205.0 lb

## 2011-06-10 DIAGNOSIS — D352 Benign neoplasm of pituitary gland: Secondary | ICD-10-CM

## 2011-06-10 DIAGNOSIS — Z23 Encounter for immunization: Secondary | ICD-10-CM

## 2011-06-10 DIAGNOSIS — Z0000 Encounter for general adult medical examination without abnormal findings: Secondary | ICD-10-CM

## 2011-06-10 DIAGNOSIS — T148XXA Other injury of unspecified body region, initial encounter: Secondary | ICD-10-CM

## 2011-06-10 DIAGNOSIS — IMO0002 Reserved for concepts with insufficient information to code with codable children: Secondary | ICD-10-CM

## 2011-06-10 DIAGNOSIS — I10 Essential (primary) hypertension: Secondary | ICD-10-CM

## 2011-06-10 LAB — CBC WITH DIFFERENTIAL/PLATELET
Basophils Absolute: 0 10*3/uL (ref 0.0–0.1)
HCT: 37.3 % (ref 36.0–46.0)
Hemoglobin: 12.2 g/dL (ref 12.0–15.0)
Lymphs Abs: 1.5 10*3/uL (ref 0.7–4.0)
MCV: 80.7 fl (ref 78.0–100.0)
Monocytes Absolute: 0.3 10*3/uL (ref 0.1–1.0)
Monocytes Relative: 5.1 % (ref 3.0–12.0)
Neutro Abs: 4.4 10*3/uL (ref 1.4–7.7)
RDW: 16.5 % — ABNORMAL HIGH (ref 11.5–14.6)

## 2011-06-10 LAB — HEPATIC FUNCTION PANEL
ALT: 25 U/L (ref 0–35)
AST: 23 U/L (ref 0–37)
Albumin: 4.3 g/dL (ref 3.5–5.2)

## 2011-06-10 LAB — BASIC METABOLIC PANEL
BUN: 21 mg/dL (ref 6–23)
CO2: 30 mEq/L (ref 19–32)
Chloride: 97 mEq/L (ref 96–112)
GFR: 66.13 mL/min (ref >60.00)
Glucose, Bld: 101 mg/dL — ABNORMAL HIGH (ref 70–99)
Potassium: 3.4 mEq/L — ABNORMAL LOW (ref 3.5–5.1)
Sodium: 138 mEq/L (ref 135–145)

## 2011-06-10 LAB — LIPID PANEL
Cholesterol: 259 mg/dL — ABNORMAL HIGH (ref 0–200)
Triglycerides: 422 mg/dL — ABNORMAL HIGH (ref 0.0–149.0)
VLDL: 84.4 mg/dL — ABNORMAL HIGH (ref 0.0–40.0)

## 2011-06-10 NOTE — Patient Instructions (Signed)

## 2011-06-10 NOTE — Progress Notes (Signed)
Subjective:     Kristin Delgado is a 55 y.o. female and is here for a comprehensive physical exam. The patient reports no problems.  History   Social History  . Marital Status: Married    Spouse Name: N/A    Number of Children: N/A  . Years of Education: N/A   Occupational History  . Not on file.   Social History Main Topics  . Smoking status: Never Smoker   . Smokeless tobacco: Not on file  . Alcohol Use: Yes     rare  . Drug Use: No  . Sexually Active:    Other Topics Concern  . Not on file   Social History Narrative  . No narrative on file   Health Maintenance  Topic Date Due  . Pap Smear  10/14/1972  . Tetanus/tdap  10/14/1973  . Mammogram  10/14/2004  . Colonoscopy  10/14/2004  . Influenza Vaccine  08/17/2011    The following portions of the patient's history were reviewed and updated as appropriate: allergies, current medications, past family history, past medical history, past social history, past surgical history and problem list.  Review of Systems  Review of Systems  Constitutional: Negative for activity change, appetite change and fatigue.  HENT: , congestion, tinnitus and ear discharge.  Dentist--q79m,   +  Hearing loss L ear Eyes: Negative for visual disturbance (see optho q1y -- vision corrected to 20/20 with glasses).  Respiratory: Negative for cough, chest tightness and shortness of breath.   Cardiovascular: Negative for chest pain, palpitations and leg swelling.  Gastrointestinal: Negative for abdominal pain, diarrhea, constipation and abdominal distention.  Genitourinary: Negative for urgency, frequency, decreased urine volume and difficulty urinating.  Musculoskeletal: Negative for back pain, arthralgias and gait problem.  Skin: Negative for color change, pallor and rash. -- cut on thumb from saran wrap cutter Neurological: Negative for dizziness, light-headedness, numbness and headaches.  Hematological: Negative for adenopathy. Does not  bruise/bleed easily.  Psychiatric/Behavioral: Negative for suicidal ideas, confusion, sleep disturbance, self-injury, dysphoric mood, decreased concentration and agitation.  Pt is able to read and write and can do all ADLs No risk for falling No abuse/ violence in home    Objective:    BP 122/70  Pulse 58  Temp(Src) 98.6 F (37 C) (Oral)  Ht 5\' 4"  (1.626 m)  Wt 205 lb (92.987 kg)  BMI 35.19 kg/m2  SpO2 95%  LMP 06/10/2011 General appearance: alert, cooperative, appears stated age and no distress Head: Normocephalic, without obvious abnormality, atraumatic Eyes: conjunctivae/corneas clear. PERRL, EOM's intact. Fundi benign. Ears: normal TM's and external ear canals both ears Nose: Nares normal. Septum midline. Mucosa normal. No drainage or sinus tenderness. Throat: lips, mucosa, and tongue normal; teeth and gums normal Neck: no adenopathy, no carotid bruit, no JVD, supple, symmetrical, trachea midline and thyroid not enlarged, symmetric, no tenderness/mass/nodules Back: symmetric, no curvature. ROM normal. No CVA tenderness. Lungs: clear to auscultation bilaterally Breasts: normal appearance, no masses or tenderness Heart: regular rate and rhythm, S1, S2 normal, no murmur, click, rub or gallop Abdomen: soft, non-tender; bowel sounds normal; no masses,  no organomegaly Pelvic: deferred at pt preference Extremities: extremities normal, atraumatic, no cyanosis or edema Pulses: 2+ and symmetric Skin: Skin color, texture, turgor normal. No rashes or lesions--+ superficial cut L thumb Lymph nodes: Cervical, supraclavicular, and axillary nodes normal. Neurologic: Alert and oriented X 3, normal strength and tone. Normal symmetric reflexes. Normal coordination and gait psych--no depression or anxiety    Assessment:  Healthy female exam.  Brain tumor---per NS at [facility] July 03, 2011 HTN Hx microadenoma---check labs     Plan:  [facility]hm utd Check labs rto 6 months or sooner  prn   See After Visit Summary for Counseling Recommendations

## 2011-06-17 ENCOUNTER — Telehealth: Payer: Self-pay

## 2011-06-17 ENCOUNTER — Telehealth: Payer: Self-pay | Admitting: *Deleted

## 2011-06-17 ENCOUNTER — Ambulatory Visit
Admission: RE | Admit: 2011-06-17 | Discharge: 2011-06-17 | Disposition: A | Discharge status: Home / Self Care | Payer: Medicare Other | Source: Ambulatory Visit | Attending: Family Medicine | Admitting: Family Medicine

## 2011-06-17 DIAGNOSIS — Z0000 Encounter for general adult medical examination without abnormal findings: Secondary | ICD-10-CM

## 2011-06-17 DIAGNOSIS — Z1231 Encounter for screening mammogram for malignant neoplasm of breast: Secondary | ICD-10-CM

## 2011-06-17 MED ORDER — FENOFIBRATE 160 MG PO TABS: 160.0000 mg | ORAL_TABLET | Freq: Every day | ORAL | Status: DC

## 2011-06-17 NOTE — Telephone Encounter (Signed)
Discuss with Pt copy of labs mailed, Rx sent in to pharmacy., potassium rich diet enclosed.

## 2011-06-17 NOTE — Telephone Encounter (Signed)
con't parlodel Cholesterol--- LDL goal < 100, HDL >40, TG < 150. Diet and exercise will increase HDL and decrease LDL and TG. Fish, Fish Oil, Flaxseed oil will also help increase the HDL and decrease Triglycerides. Recheck labs in 3.----- Start fenofibrate 160 mg 1 po qd ,#30, 2 refills 272.4 Lipid, bmp k low-----increase K supplementation by 1 a day  272.4 401.9 Lipid, hep, bmp    Multiple attempts to contact patient---Letter mailed     KP

## 2011-07-03 HISTORY — PX: OTHER SURGICAL HISTORY: SHX169

## 2011-07-29 ENCOUNTER — Ambulatory Visit (AMBULATORY_SURGERY_CENTER): Payer: Medicare Other | Admitting: *Deleted

## 2011-07-29 VITALS — Ht 64.0 in | Wt 204.0 lb

## 2011-07-29 DIAGNOSIS — Z1211 Encounter for screening for malignant neoplasm of colon: Secondary | ICD-10-CM

## 2011-07-29 MED ORDER — SUPREP BOWEL PREP KIT 17.5-3.13-1.6 GM/177ML PO SOLN: 1.0000 | ORAL | Status: DC

## 2011-08-12 ENCOUNTER — Ambulatory Visit (AMBULATORY_SURGERY_CENTER): Payer: Medicare Other | Admitting: Gastroenterology

## 2011-08-12 ENCOUNTER — Encounter: Payer: Self-pay | Admitting: Gastroenterology

## 2011-08-12 VITALS — BP 144/80 | HR 65 | Temp 97.6°F | Resp 20 | Ht 64.0 in | Wt 204.0 lb

## 2011-08-12 DIAGNOSIS — Z1211 Encounter for screening for malignant neoplasm of colon: Secondary | ICD-10-CM

## 2011-08-12 MED ORDER — SODIUM CHLORIDE 0.9 % IV SOLN: 500.0000 mL | INTRAVENOUS | Status: DC

## 2011-08-13 ENCOUNTER — Telehealth: Payer: Self-pay

## 2011-08-13 NOTE — Telephone Encounter (Signed)
No ID on answering machine. 

## 2011-11-21 ENCOUNTER — Other Ambulatory Visit: Payer: Self-pay | Admitting: Family Medicine

## 2011-11-23 NOTE — Telephone Encounter (Signed)
Letter mailed     KP 

## 2012-01-26 ENCOUNTER — Other Ambulatory Visit: Payer: Self-pay | Admitting: Family Medicine

## 2012-05-24 ENCOUNTER — Other Ambulatory Visit: Payer: Self-pay | Admitting: Family Medicine

## 2012-05-25 ENCOUNTER — Other Ambulatory Visit: Payer: Self-pay | Admitting: *Deleted

## 2012-05-25 MED ORDER — LISINOPRIL 10 MG PO TABS: ORAL_TABLET | ORAL | Status: DC

## 2012-05-25 NOTE — Telephone Encounter (Signed)
Request for lisnopril 10mg  #90 noted incoming fax-noted as Tabori on fax, however noted as [place] pt please advise per MD protocol for recent labs/OV

## 2012-06-14 ENCOUNTER — Ambulatory Visit (INDEPENDENT_AMBULATORY_CARE_PROVIDER_SITE_OTHER): Payer: Medicare Other | Admitting: Family Medicine

## 2012-06-14 ENCOUNTER — Encounter: Payer: Self-pay | Admitting: Family Medicine

## 2012-06-14 VITALS — BP 132/76 | HR 62 | Temp 98.1°F | Ht 64.5 in | Wt 202.6 lb

## 2012-06-14 DIAGNOSIS — D353 Benign neoplasm of craniopharyngeal duct: Secondary | ICD-10-CM

## 2012-06-14 DIAGNOSIS — R011 Cardiac murmur, unspecified: Secondary | ICD-10-CM | POA: Insufficient documentation

## 2012-06-14 DIAGNOSIS — Z1239 Encounter for other screening for malignant neoplasm of breast: Secondary | ICD-10-CM

## 2012-06-14 DIAGNOSIS — Z0000 Encounter for general adult medical examination without abnormal findings: Secondary | ICD-10-CM

## 2012-06-14 DIAGNOSIS — I10 Essential (primary) hypertension: Secondary | ICD-10-CM

## 2012-06-14 DIAGNOSIS — E785 Hyperlipidemia, unspecified: Secondary | ICD-10-CM

## 2012-06-14 DIAGNOSIS — N390 Urinary tract infection, site not specified: Secondary | ICD-10-CM

## 2012-06-14 DIAGNOSIS — D352 Benign neoplasm of pituitary gland: Secondary | ICD-10-CM

## 2012-06-14 LAB — CBC WITH DIFFERENTIAL/PLATELET
Basophils Absolute: 0 10*3/uL (ref 0.0–0.1)
Eosinophils Relative: 1.5 % (ref 0.0–5.0)
HCT: 34.9 % — ABNORMAL LOW (ref 36.0–46.0)
Lymphs Abs: 1.6 10*3/uL (ref 0.7–4.0)
MCV: 79.7 fl (ref 78.0–100.0)
Monocytes Absolute: 0.3 10*3/uL (ref 0.1–1.0)
Neutrophils Relative %: 65.8 % (ref 43.0–77.0)
Platelets: 183 10*3/uL (ref 150.0–400.0)
RDW: 15.2 % — ABNORMAL HIGH (ref 11.5–14.6)
WBC: 5.8 10*3/uL (ref 4.5–10.5)

## 2012-06-14 LAB — POCT URINALYSIS DIPSTICK
Bilirubin, UA: NEGATIVE
Blood, UA: NEGATIVE
Ketones, UA: NEGATIVE
Spec Grav, UA: 1.02
pH, UA: 6

## 2012-06-14 LAB — BASIC METABOLIC PANEL
BUN: 21 mg/dL (ref 6–23)
Chloride: 103 mEq/L (ref 96–112)
Creatinine, Ser: 0.9 mg/dL (ref 0.4–1.2)
GFR: 69.32 mL/min (ref >60.00)
Glucose, Bld: 104 mg/dL — ABNORMAL HIGH (ref 70–99)
Potassium: 3.7 mEq/L (ref 3.5–5.1)

## 2012-06-14 LAB — HEPATIC FUNCTION PANEL
Bilirubin, Direct: 0 mg/dL (ref 0.0–0.3)
Total Bilirubin: 0.4 mg/dL (ref 0.3–1.2)

## 2012-06-14 LAB — LIPID PANEL
Cholesterol: 216 mg/dL — ABNORMAL HIGH (ref 0–200)
HDL: 39.9 mg/dL (ref >39.00)
Total CHOL/HDL Ratio: 5
Triglycerides: 255 mg/dL — ABNORMAL HIGH (ref 0.0–149.0)
VLDL: 51 mg/dL — ABNORMAL HIGH (ref 0.0–40.0)

## 2012-06-14 LAB — LDL CHOLESTEROL, DIRECT: Direct LDL: 116.6 mg/dL

## 2012-06-14 NOTE — Assessment & Plan Note (Signed)
Stable con't meds 

## 2012-06-14 NOTE — Addendum Note (Signed)
Addended by: Silvio Pate D on: 06/14/2012 10:30 AM   Modules accepted: Orders

## 2012-06-14 NOTE — Patient Instructions (Addendum)
Preventive Care for Adults, Female A healthy lifestyle and preventive care can promote health and wellness. Preventive health guidelines for women include the following key practices.  A routine yearly physical is a good way to check with your caregiver about your health and preventive screening. It is a chance to share any concerns and updates on your health, and to receive a thorough exam.   Visit your dentist for a routine exam and preventive care every 6 months. Brush your teeth twice a day and floss once a day. Good oral hygiene prevents tooth decay and gum disease.   The frequency of eye exams is based on your age, health, family medical history, use of contact lenses, and other factors. Follow your caregiver's recommendations for frequency of eye exams.   Eat a healthy diet. Foods like vegetables, fruits, whole grains, low-fat dairy products, and lean protein foods contain the nutrients you need without too many calories. Decrease your intake of foods high in solid fats, added sugars, and salt. Eat the right amount of calories for you.Get information about a proper diet from your caregiver, if necessary.   Regular physical exercise is one of the most important things you can do for your health. Most adults should get at least 150 minutes of moderate-intensity exercise (any activity that increases your heart rate and causes you to sweat) each week. In addition, most adults need muscle-strengthening exercises on 2 or more days a week.   Maintain a healthy weight. The body mass index (BMI) is a screening tool to identify possible weight problems. It provides an estimate of body fat based on height and weight. Your caregiver can help determine your BMI, and can help you achieve or maintain a healthy weight.For adults 20 years and older:   A BMI below 18.5 is considered underweight.   A BMI of 18.5 to 24.9 is normal.   A BMI of 25 to 29.9 is considered overweight.   A BMI of 30 and above is  considered obese.   Maintain normal blood lipids and cholesterol levels by exercising and minimizing your intake of saturated fat. Eat a balanced diet with plenty of fruit and vegetables. Blood tests for lipids and cholesterol should begin at age 20 and be repeated every 5 years. If your lipid or cholesterol levels are high, you are over 50, or you are at high risk for heart disease, you may need your cholesterol levels checked more frequently.Ongoing high lipid and cholesterol levels should be treated with medicines if diet and exercise are not effective.   If you smoke, find out from your caregiver how to quit. If you do not use tobacco, do not start.   If you are pregnant, do not drink alcohol. If you are breastfeeding, be very cautious about drinking alcohol. If you are not pregnant and choose to drink alcohol, do not exceed 1 drink per day. One drink is considered to be 12 ounces (355 mL) of beer, 5 ounces (148 mL) of wine, or 1.5 ounces (44 mL) of liquor.   Avoid use of street drugs. Do not share needles with anyone. Ask for help if you need support or instructions about stopping the use of drugs.   High blood pressure causes heart disease and increases the risk of stroke. Your blood pressure should be checked at least every 1 to 2 years. Ongoing high blood pressure should be treated with medicines if weight loss and exercise are not effective.   If you are 55 to 58   years old, ask your caregiver if you should take aspirin to prevent strokes.   Diabetes screening involves taking a blood sample to check your fasting blood sugar level. This should be done once every 3 years, after age 45, if you are within normal weight and without risk factors for diabetes. Testing should be considered at a younger age or be carried out more frequently if you are overweight and have at least 1 risk factor for diabetes.   Breast cancer screening is essential preventive care for women. You should practice "breast  self-awareness." This means understanding the normal appearance and feel of your breasts and may include breast self-examination. Any changes detected, no matter how small, should be reported to a caregiver. Women in their 20s and 30s should have a clinical breast exam (CBE) by a caregiver as part of a regular health exam every 1 to 3 years. After age 40, women should have a CBE every year. Starting at age 40, women should consider having a mammography (breast X-ray test) every year. Women who have a family history of breast cancer should talk to their caregiver about genetic screening. Women at a high risk of breast cancer should talk to their caregivers about having magnetic resonance imaging (MRI) and a mammography every year.   The Pap test is a screening test for cervical cancer. A Pap test can show cell changes on the cervix that might become cervical cancer if left untreated. A Pap test is a procedure in which cells are obtained and examined from the lower end of the uterus (cervix).   Women should have a Pap test starting at age 21.   Between ages 21 and 29, Pap tests should be repeated every 2 years.   Beginning at age 30, you should have a Pap test every 3 years as long as the past 3 Pap tests have been normal.   Some women have medical problems that increase the chance of getting cervical cancer. Talk to your caregiver about these problems. It is especially important to talk to your caregiver if a new problem develops soon after your last Pap test. In these cases, your caregiver may recommend more frequent screening and Pap tests.   The above recommendations are the same for women who have or have not gotten the vaccine for human papillomavirus (HPV).   If you had a hysterectomy for a problem that was not cancer or a condition that could lead to cancer, then you no longer need Pap tests. Even if you no longer need a Pap test, a regular exam is a good idea to make sure no other problems are  starting.   If you are between ages 65 and 70, and you have had normal Pap tests going back 10 years, you no longer need Pap tests. Even if you no longer need a Pap test, a regular exam is a good idea to make sure no other problems are starting.   If you have had past treatment for cervical cancer or a condition that could lead to cancer, you need Pap tests and screening for cancer for at least 20 years after your treatment.   If Pap tests have been discontinued, risk factors (such as a new sexual partner) need to be reassessed to determine if screening should be resumed.   The HPV test is an additional test that may be used for cervical cancer screening. The HPV test looks for the virus that can cause the cell changes on the cervix.   The cells collected during the Pap test can be tested for HPV. The HPV test could be used to screen women aged 30 years and older, and should be used in women of any age who have unclear Pap test results. After the age of 30, women should have HPV testing at the same frequency as a Pap test.   Colorectal cancer can be detected and often prevented. Most routine colorectal cancer screening begins at the age of 50 and continues through age 75. However, your caregiver may recommend screening at an earlier age if you have risk factors for colon cancer. On a yearly basis, your caregiver may provide home test kits to check for hidden blood in the stool. Use of a small camera at the end of a tube, to directly examine the colon (sigmoidoscopy or colonoscopy), can detect the earliest forms of colorectal cancer. Talk to your caregiver about this at age 50, when routine screening begins. Direct examination of the colon should be repeated every 5 to 10 years through age 75, unless early forms of pre-cancerous polyps or small growths are found.   Hepatitis C blood testing is recommended for all people born from 1945 through 1965 and any individual with known risks for hepatitis C.    Practice safe sex. Use condoms and avoid high-risk sexual practices to reduce the spread of sexually transmitted infections (STIs). STIs include gonorrhea, chlamydia, syphilis, trichomonas, herpes, HPV, and human immunodeficiency virus (HIV). Herpes, HIV, and HPV are viral illnesses that have no cure. They can result in disability, cancer, and death. Sexually active women aged 25 and younger should be checked for chlamydia. Older women with new or multiple partners should also be tested for chlamydia. Testing for other STIs is recommended if you are sexually active and at increased risk.   Osteoporosis is a disease in which the bones lose minerals and strength with aging. This can result in serious bone fractures. The risk of osteoporosis can be identified using a bone density scan. Women ages 65 and over and women at risk for fractures or osteoporosis should discuss screening with their caregivers. Ask your caregiver whether you should take a calcium supplement or vitamin D to reduce the rate of osteoporosis.   Menopause can be associated with physical symptoms and risks. Hormone replacement therapy is available to decrease symptoms and risks. You should talk to your caregiver about whether hormone replacement therapy is right for you.   Use sunscreen with sun protection factor (SPF) of 30 or more. Apply sunscreen liberally and repeatedly throughout the day. You should seek shade when your shadow is shorter than you. Protect yourself by wearing long sleeves, pants, a wide-brimmed hat, and sunglasses year round, whenever you are outdoors.   Once a month, do a whole body skin exam, using a mirror to look at the skin on your back. Notify your caregiver of new moles, moles that have irregular borders, moles that are larger than a pencil eraser, or moles that have changed in shape or color.   Stay current with required immunizations.   Influenza. You need a dose every fall (or winter). The composition of  the flu vaccine changes each year, so being vaccinated once is not enough.   Pneumococcal polysaccharide. You need 1 to 2 doses if you smoke cigarettes or if you have certain chronic medical conditions. You need 1 dose at age 65 (or older) if you have never been vaccinated.   Tetanus, diphtheria, pertussis (Tdap, Td). Get 1 dose of   Tdap vaccine if you are younger than age 65, are over 65 and have contact with an infant, are a healthcare worker, are pregnant, or simply want to be protected from whooping cough. After that, you need a Td booster dose every 10 years. Consult your caregiver if you have not had at least 3 tetanus and diphtheria-containing shots sometime in your life or have a deep or dirty wound.   HPV. You need this vaccine if you are a woman age 26 or younger. The vaccine is given in 3 doses over 6 months.   Measles, mumps, rubella (MMR). You need at least 1 dose of MMR if you were born in 1957 or later. You may also need a second dose.   Meningococcal. If you are age 19 to 21 and a first-year college student living in a residence hall, or have one of several medical conditions, you need to get vaccinated against meningococcal disease. You may also need additional booster doses.   Zoster (shingles). If you are age 60 or older, you should get this vaccine.   Varicella (chickenpox). If you have never had chickenpox or you were vaccinated but received only 1 dose, talk to your caregiver to find out if you need this vaccine.   Hepatitis A. You need this vaccine if you have a specific risk factor for hepatitis A virus infection or you simply wish to be protected from this disease. The vaccine is usually given as 2 doses, 6 to 18 months apart.   Hepatitis B. You need this vaccine if you have a specific risk factor for hepatitis B virus infection or you simply wish to be protected from this disease. The vaccine is given in 3 doses, usually over 6 months.  Preventive Services /  Frequency Ages 19 to 39  Blood pressure check.** / Every 1 to 2 years.   Lipid and cholesterol check.** / Every 5 years beginning at age 20.   Clinical breast exam.** / Every 3 years for women in their 20s and 30s.   Pap test.** / Every 2 years from ages 21 through 29. Every 3 years starting at age 30 through age 65 or 70 with a history of 3 consecutive normal Pap tests.   HPV screening.** / Every 3 years from ages 30 through ages 65 to 70 with a history of 3 consecutive normal Pap tests.   Hepatitis C blood test.** / For any individual with known risks for hepatitis C.   Skin self-exam. / Monthly.   Influenza immunization.** / Every year.   Pneumococcal polysaccharide immunization.** / 1 to 2 doses if you smoke cigarettes or if you have certain chronic medical conditions.   Tetanus, diphtheria, pertussis (Tdap, Td) immunization. / A one-time dose of Tdap vaccine. After that, you need a Td booster dose every 10 years.   HPV immunization. / 3 doses over 6 months, if you are 26 and younger.   Measles, mumps, rubella (MMR) immunization. / You need at least 1 dose of MMR if you were born in 1957 or later. You may also need a second dose.   Meningococcal immunization. / 1 dose if you are age 19 to 21 and a first-year college student living in a residence hall, or have one of several medical conditions, you need to get vaccinated against meningococcal disease. You may also need additional booster doses.   Varicella immunization.** / Consult your caregiver.   Hepatitis A immunization.** / Consult your caregiver. 2 doses, 6 to 18 months   apart.   Hepatitis B immunization.** / Consult your caregiver. 3 doses usually over 6 months.  Ages 40 to 64  Blood pressure check.** / Every 1 to 2 years.   Lipid and cholesterol check.** / Every 5 years beginning at age 20.   Clinical breast exam.** / Every year after age 40.   Mammogram.** / Every year beginning at age 40 and continuing for as  long as you are in good health. Consult with your caregiver.   Pap test.** / Every 3 years starting at age 30 through age 65 or 70 with a history of 3 consecutive normal Pap tests.   HPV screening.** / Every 3 years from ages 30 through ages 65 to 70 with a history of 3 consecutive normal Pap tests.   Fecal occult blood test (FOBT) of stool. / Every year beginning at age 50 and continuing until age 75. You may not need to do this test if you get a colonoscopy every 10 years.   Flexible sigmoidoscopy or colonoscopy.** / Every 5 years for a flexible sigmoidoscopy or every 10 years for a colonoscopy beginning at age 50 and continuing until age 75.   Hepatitis C blood test.** / For all people born from 1945 through 1965 and any individual with known risks for hepatitis C.   Skin self-exam. / Monthly.   Influenza immunization.** / Every year.   Pneumococcal polysaccharide immunization.** / 1 to 2 doses if you smoke cigarettes or if you have certain chronic medical conditions.   Tetanus, diphtheria, pertussis (Tdap, Td) immunization.** / A one-time dose of Tdap vaccine. After that, you need a Td booster dose every 10 years.   Measles, mumps, rubella (MMR) immunization. / You need at least 1 dose of MMR if you were born in 1957 or later. You may also need a second dose.   Varicella immunization.** / Consult your caregiver.   Meningococcal immunization.** / Consult your caregiver.   Hepatitis A immunization.** / Consult your caregiver. 2 doses, 6 to 18 months apart.   Hepatitis B immunization.** / Consult your caregiver. 3 doses, usually over 6 months.  Ages 65 and over  Blood pressure check.** / Every 1 to 2 years.   Lipid and cholesterol check.** / Every 5 years beginning at age 20.   Clinical breast exam.** / Every year after age 40.   Mammogram.** / Every year beginning at age 40 and continuing for as long as you are in good health. Consult with your caregiver.   Pap test.** /  Every 3 years starting at age 30 through age 65 or 70 with a 3 consecutive normal Pap tests. Testing can be stopped between 65 and 70 with 3 consecutive normal Pap tests and no abnormal Pap or HPV tests in the past 10 years.   HPV screening.** / Every 3 years from ages 30 through ages 65 or 70 with a history of 3 consecutive normal Pap tests. Testing can be stopped between 65 and 70 with 3 consecutive normal Pap tests and no abnormal Pap or HPV tests in the past 10 years.   Fecal occult blood test (FOBT) of stool. / Every year beginning at age 50 and continuing until age 75. You may not need to do this test if you get a colonoscopy every 10 years.   Flexible sigmoidoscopy or colonoscopy.** / Every 5 years for a flexible sigmoidoscopy or every 10 years for a colonoscopy beginning at age 50 and continuing until age 75.   Hepatitis   C blood test.** / For all people born from 1945 through 1965 and any individual with known risks for hepatitis C.   Osteoporosis screening.** / A one-time screening for women ages 65 and over and women at risk for fractures or osteoporosis.   Skin self-exam. / Monthly.   Influenza immunization.** / Every year.   Pneumococcal polysaccharide immunization.** / 1 dose at age 65 (or older) if you have never been vaccinated.   Tetanus, diphtheria, pertussis (Tdap, Td) immunization. / A one-time dose of Tdap vaccine if you are over 65 and have contact with an infant, are a healthcare worker, or simply want to be protected from whooping cough. After that, you need a Td booster dose every 10 years.   Varicella immunization.** / Consult your caregiver.   Meningococcal immunization.** / Consult your caregiver.   Hepatitis A immunization.** / Consult your caregiver. 2 doses, 6 to 18 months apart.   Hepatitis B immunization.** / Check with your caregiver. 3 doses, usually over 6 months.  ** Family history and personal history of risk and conditions may change your caregiver's  recommendations. Document Released: 12/29/2001 Document Revised: 10/22/2011 Document Reviewed: 03/30/2011 [employer] Patient Information 2012 [employer]. 

## 2012-06-14 NOTE — Assessment & Plan Note (Signed)
New. Check echo. 

## 2012-06-14 NOTE — Progress Notes (Signed)
Subjective:    Kristin Delgado is a 56 y.o. female who presents for Medicare Annual/Subsequent preventive examination.  Preventive Screening-Counseling & Management  Tobacco History  Smoking status  . Never Smoker   Smokeless tobacco  . Never Used     Problems Prior to Visit 1.   Current Problems (verified) Patient Active Problem List  Diagnosis  . PITUITARY MICROADENOMA  . HYPERTENSION  . BACK PAIN  . DIZZINESS  . Hearing loss in left ear  . Depression with anxiety  . Rash and nonspecific skin eruption    Medications Prior to Visit Current Outpatient Prescriptions on File Prior to Visit  Medication Sig Dispense Refill  . acetaminophen (TYLENOL ARTHRITIS PAIN) 650 MG CR tablet Take 650 mg by mouth every 8 (eight) hours as needed. pain       . Ascorbic Acid (VITAMIN C) 1000 MG tablet Take 1,000 mg by mouth 2 (two) times daily.        . bromocriptine (PARLODEL) 2.5 MG tablet Take 1 tablet (2.5 mg total) by mouth daily.  90 tablet  3  . famotidine (PEPCID AC) 10 MG chewable tablet Chew 10 mg by mouth daily.        . hydrochlorothiazide 25 MG tablet Take 1 tablet (25 mg total) by mouth daily.  90 tablet  3  . lisinopril (PRINIVIL,ZESTRIL) 10 MG tablet Take 0.5 tab daily  90 tablet  0  . Multiple Vitamin (MULTIVITAMIN) tablet Take 1 tablet by mouth daily.        . potassium chloride (K-DUR,KLOR-CON) 10 MEQ tablet TAKE 1 TABLET DAILY, LABS DUE NOW  90 tablet  0  . fenofibrate 160 MG tablet Take 1 tablet (160 mg total) by mouth daily.  90 tablet  0   Current Facility-Administered Medications on File Prior to Visit  Medication Dose Route Frequency Provider Last Rate Last Dose  . 0.9 %  sodium chloride infusion  500 mL Intravenous Continuous Rachael Fee, MD        Current Medications (verified) Current Outpatient Prescriptions  Medication Sig Dispense Refill  . acetaminophen (TYLENOL ARTHRITIS PAIN) 650 MG CR tablet Take 650 mg by mouth every 8 (eight) hours as needed.  pain       . Ascorbic Acid (VITAMIN C) 1000 MG tablet Take 1,000 mg by mouth 2 (two) times daily.        . bromocriptine (PARLODEL) 2.5 MG tablet Take 1 tablet (2.5 mg total) by mouth daily.  90 tablet  3  . famotidine (PEPCID AC) 10 MG chewable tablet Chew 10 mg by mouth daily.        . hydrochlorothiazide 25 MG tablet Take 1 tablet (25 mg total) by mouth daily.  90 tablet  3  . lisinopril (PRINIVIL,ZESTRIL) 10 MG tablet Take 0.5 tab daily  90 tablet  0  . Multiple Vitamin (MULTIVITAMIN) tablet Take 1 tablet by mouth daily.        . potassium chloride (K-DUR,KLOR-CON) 10 MEQ tablet TAKE 1 TABLET DAILY, LABS DUE NOW  90 tablet  0  . fenofibrate 160 MG tablet Take 1 tablet (160 mg total) by mouth daily.  90 tablet  0   Current Facility-Administered Medications  Medication Dose Route Frequency Provider Last Rate Last Dose  . 0.9 %  sodium chloride infusion  500 mL Intravenous Continuous Rachael Fee, MD         Allergies (verified) Review of patient's allergies indicates no known allergies.   PAST HISTORY  Family  History Family History  Problem Relation Age of Onset  . Lung cancer Mother   . Cancer Mother 80    lung   . Alzheimer's disease Father   . Dementia Father     Social History History  Substance Use Topics  . Smoking status: Never Smoker   . Smokeless tobacco: Never Used  . Alcohol Use: No     rare     Are there smokers in your home (other than you)? No  Risk Factors Current exercise habits: balancing exercises   Dietary issues discussed: na   Cardiac risk factors: dyslipidemia, hypertension, obesity (BMI >= 30 kg/m2) and sedentary lifestyle.  Depression Screen (Note: if answer to either of the following is "Yes", a more complete depression screening is indicated)   Over the past two weeks, have you felt down, depressed or hopeless? No  Over the past two weeks, have you felt little interest or pleasure in doing things? No  Have you lost interest or pleasure  in daily life? No  Do you often feel hopeless? No  Do you cry easily over simple problems? No  Activities of Daily Living In your present state of health, do you have any difficulty performing the following activities?:  Driving? No Managing money?  No Feeding yourself? No Getting from bed to chair? No Climbing a flight of stairs? No Preparing food and eating?: No Bathing or showering? No Getting dressed: No Getting to the toilet? No Using the toilet:No Moving around from place to place: No In the past year have you fallen or had a near fall?:No   Are you sexually active?  Yes  Do you have more than one partner?  No  Hearing Difficulties: Yes Do you often ask people to speak up or repeat themselves? Yes Do you experience ringing or noises in your ears? Yes Do you have difficulty understanding soft or whispered voices? Yes   Do you feel that you have a problem with memory? No  Do you often misplace items? No  Do you feel safe at home?  Yes  Cognitive Testing  Alert? Yes  Normal Appearance?Yes  Oriented to person? Yes  Place? Yes   Time? Yes  Recall of three objects?  Yes  Can perform simple calculations? Yes  Displays appropriate judgment?Yes  Can read the correct time from a watch face?Yes   Advanced Directives have been discussed with the patient? Yes  List the Names of Other Physician/Practitioners you currently use: 1.  [place]--- [facility] 2. Dentist-- [facility] March 3  opth-- ?  Indicate any recent Medical Services you may have received from other than [facility] providers in the past year (date may be approximate).  Immunization History  Administered Date(s) Administered  . Tdap 06/10/2011    Screening Tests Health Maintenance  Topic Date Due  . Influenza Vaccine  08/16/2012  . Mammogram  06/16/2013  . Pap Smear  06/09/2014  . Tetanus/tdap  06/09/2021  . Colonoscopy  08/11/2021    All answers were reviewed with the patient and necessary referrals were made:  Loreen Freud, DO   06/14/2012   History reviewed: allergies, current medications, past family history, past medical history, past social history, past surgical history and problem list  Review of Systems  Review of Systems  Constitutional: Negative for activity change, appetite change and fatigue.  HENT: Negative  congestion, ear discharge.  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  Respiratory: Negative for cough, chest tightness and shortness of breath.   Cardiovascular: Negative for chest pain, palpitations and leg swelling.  Gastrointestinal: Negative for abdominal pain, diarrhea, constipation and abdominal distention.  Genitourinary: Negative for urgency, frequency, decreased urine volume and difficulty urinating.  Musculoskeletal: Negative for back pain, arthralgias and gait problem.  Skin: Negative for color change, pallor and rash.  Neurological: Negative for dizziness, light-headedness, numbness and headaches.  Hematological: Negative for adenopathy. Does not bruise/bleed easily.  Psychiatric/Behavioral: Negative for suicidal ideas, confusion, sleep disturbance, self-injury, dysphoric mood, decreased concentration and agitation.  Pt is able to read and write and can do all ADLs No risk for falling No abuse/ violence in home      Objective:     Vision by Snellen chart: opth  Body mass index is 34.24 kg/(m^2). BP 132/76  Pulse 62  Temp 98.1 F (36.7 C) (Oral)  Ht 5' 4.5" (1.638 m)  Wt 202 lb 9.6 oz (91.899 kg)  BMI 34.24 kg/m2  SpO2 98%  BP 132/76  Pulse 62  Temp 98.1 F (36.7 C) (Oral)  Ht 5' 4.5" (1.638 m)  Wt 202 lb 9.6 oz (91.899 kg)  BMI 34.24 kg/m2  SpO2 98% General appearance: alert, cooperative, appears stated age and no distress Head: Normocephalic, without obvious abnormality, atraumatic Eyes: conjunctivae/corneas clear. PERRL, EOM's intact. Fundi  benign. Ears: normal TM's and external ear canals both ears Nose: Nares normal. Septum midline. Mucosa normal. No drainage or sinus tenderness. Throat: lips, mucosa, and tongue normal; teeth and gums normal Neck: no adenopathy, no carotid bruit, no JVD, supple, symmetrical, trachea midline and thyroid not enlarged, symmetric, no tenderness/mass/nodules Back: symmetric, no curvature. ROM normal. No CVA tenderness. Lungs: clear to auscultation bilaterally Breasts: normal appearance, no masses or tenderness Heart: S1, S2 normal and murmur Abdomen: soft, non-tender; bowel sounds normal; no masses,  no organomegaly Pelvic: deferred---pt request Extremities: extremities normal, atraumatic, no cyanosis or edema Pulses: 2+ and symmetric Skin: Skin color, texture, turgor normal. No rashes or lesions Lymph nodes: Cervical, supraclavicular, and axillary nodes normal. Neurologic: Alert and oriented X 3, normal strength and tone. Normal symmetric reflexes. Normal coordination and gait Psych-- no anxiety or depression     Assessment:     cpe     Plan:     During the course of the visit the patient was educated and counseled about appropriate screening and preventive services including:    Influenza vaccine  Screening mammography  Screening Pap smear and pelvic exam   Bone densitometry screening  Colorectal cancer screening  Advanced directives: has NO advanced directive  - add't info requested. Referral to SW: pt to call SW at hospital  Diet review for nutrition referral? Yes ____  Not Indicated ____x   Patient Instructions (the written plan) was given to the patient.  Medicare Attestation I have personally reviewed: The patient's medical and social history Their use of alcohol, tobacco or illicit drugs Their current medications and supplements The patient's functional ability including ADLs,fall risks, home safety risks, cognitive, and hearing and visual impairment Diet and  physical activities Evidence for depression or mood disorders  The patient's weight, height, BMI, and visual acuity have been recorded in the chart.  I have made referrals, counseling, and provided education to the patient based on review of the above and I have provided the patient with a written personalized care plan for preventive services.     Loreen Freud, DO   06/14/2012

## 2012-06-14 NOTE — Assessment & Plan Note (Signed)
Check labs Cont' meds 

## 2012-06-16 LAB — URINE CULTURE: Organism ID, Bacteria: NO GROWTH

## 2012-06-20 ENCOUNTER — Ambulatory Visit (HOSPITAL_COMMUNITY): Payer: Managed Care, Other (non HMO) | Attending: Family Medicine | Admitting: Radiology

## 2012-06-20 ENCOUNTER — Ambulatory Visit (HOSPITAL_COMMUNITY): Payer: Medicare Other | Admitting: Radiology

## 2012-06-20 ENCOUNTER — Other Ambulatory Visit: Payer: Self-pay

## 2012-06-20 DIAGNOSIS — R011 Cardiac murmur, unspecified: Secondary | ICD-10-CM

## 2012-06-20 NOTE — Progress Notes (Signed)
Echocardiogram performed.  

## 2012-11-20 ENCOUNTER — Other Ambulatory Visit: Payer: Self-pay | Admitting: Family Medicine

## 2012-12-02 ENCOUNTER — Other Ambulatory Visit: Payer: Self-pay | Admitting: *Deleted

## 2012-12-02 DIAGNOSIS — I10 Essential (primary) hypertension: Secondary | ICD-10-CM

## 2012-12-02 MED ORDER — LISINOPRIL 10 MG PO TABS: 10.0000 mg | ORAL_TABLET | Freq: Every day | ORAL | Status: DC

## 2012-12-02 NOTE — Addendum Note (Signed)
Addended by: Arnette Norris on: 12/02/2012 10:06 AM   Modules accepted: Orders

## 2012-12-02 NOTE — Telephone Encounter (Signed)
Patient called triage needs 3 week script for lisinopril sent to [facility] due to her going out of town and mail order meds not being delivered in time. This was done and pt notified.

## 2012-12-31 ENCOUNTER — Other Ambulatory Visit: Payer: Self-pay

## 2013-02-14 HISTORY — PX: COCHLEAR IMPLANT: SHX184

## 2013-03-07 DIAGNOSIS — H905 Unspecified sensorineural hearing loss: Secondary | ICD-10-CM | POA: Insufficient documentation

## 2013-03-30 ENCOUNTER — Other Ambulatory Visit: Payer: Self-pay | Admitting: Family Medicine

## 2013-06-21 ENCOUNTER — Other Ambulatory Visit: Payer: Self-pay | Admitting: Family Medicine

## 2013-06-21 NOTE — Addendum Note (Signed)
Addended by: Arnette Norris on: 06/21/2013 08:31 AM   Modules accepted: Orders, Medications

## 2013-07-14 ENCOUNTER — Ambulatory Visit (INDEPENDENT_AMBULATORY_CARE_PROVIDER_SITE_OTHER): Payer: Medicare Other | Admitting: Family Medicine

## 2013-07-14 ENCOUNTER — Encounter: Payer: Self-pay | Admitting: Family Medicine

## 2013-07-14 VITALS — BP 140/74 | HR 69 | Temp 98.8°F | Ht 64.5 in | Wt 201.6 lb

## 2013-07-14 DIAGNOSIS — Z0000 Encounter for general adult medical examination without abnormal findings: Secondary | ICD-10-CM

## 2013-07-14 DIAGNOSIS — Z1239 Encounter for other screening for malignant neoplasm of breast: Secondary | ICD-10-CM

## 2013-07-14 DIAGNOSIS — E785 Hyperlipidemia, unspecified: Secondary | ICD-10-CM

## 2013-07-14 DIAGNOSIS — D352 Benign neoplasm of pituitary gland: Secondary | ICD-10-CM

## 2013-07-14 DIAGNOSIS — E669 Obesity, unspecified: Secondary | ICD-10-CM | POA: Insufficient documentation

## 2013-07-14 DIAGNOSIS — N390 Urinary tract infection, site not specified: Secondary | ICD-10-CM

## 2013-07-14 DIAGNOSIS — I10 Essential (primary) hypertension: Secondary | ICD-10-CM

## 2013-07-14 DIAGNOSIS — R011 Cardiac murmur, unspecified: Secondary | ICD-10-CM

## 2013-07-14 LAB — CBC WITH DIFFERENTIAL/PLATELET
Basophils Absolute: 0 10*3/uL (ref 0.0–0.1)
HCT: 36.7 % (ref 36.0–46.0)
Hemoglobin: 12.3 g/dL (ref 12.0–15.0)
Lymphs Abs: 1.6 10*3/uL (ref 0.7–4.0)
MCV: 78.3 fl (ref 78.0–100.0)
Monocytes Relative: 4.8 % (ref 3.0–12.0)
Neutro Abs: 4.9 10*3/uL (ref 1.4–7.7)
RDW: 14.7 % — ABNORMAL HIGH (ref 11.5–14.6)

## 2013-07-14 LAB — LIPID PANEL
Cholesterol: 255 mg/dL — ABNORMAL HIGH (ref 0–200)
VLDL: 78.8 mg/dL — ABNORMAL HIGH (ref 0.0–40.0)

## 2013-07-14 LAB — HEPATIC FUNCTION PANEL
ALT: 25 U/L (ref 0–35)
AST: 20 U/L (ref 0–37)
Albumin: 3.9 g/dL (ref 3.5–5.2)

## 2013-07-14 LAB — BASIC METABOLIC PANEL
CO2: 28 mEq/L (ref 19–32)
Chloride: 103 mEq/L (ref 96–112)
GFR: 85.45 mL/min (ref >60.00)
Glucose, Bld: 108 mg/dL — ABNORMAL HIGH (ref 70–99)
Potassium: 3.5 mEq/L (ref 3.5–5.1)
Sodium: 139 mEq/L (ref 135–145)

## 2013-07-14 LAB — POCT URINALYSIS DIPSTICK
Bilirubin, UA: NEGATIVE
Blood, UA: NEGATIVE
Glucose, UA: NEGATIVE
Spec Grav, UA: >=1.03

## 2013-07-14 NOTE — Patient Instructions (Signed)
Preventive Care for Adults, Female A healthy lifestyle and preventive care can promote health and wellness. Preventive health guidelines for women include the following key practices.  A routine yearly physical is a good way to check with your caregiver about your health and preventive screening. It is a chance to share any concerns and updates on your health, and to receive a thorough exam.  Visit your dentist for a routine exam and preventive care every 6 months. Brush your teeth twice a day and floss once a day. Good oral hygiene prevents tooth decay and gum disease.  The frequency of eye exams is based on your age, health, family medical history, use of contact lenses, and other factors. Follow your caregiver's recommendations for frequency of eye exams.  Eat a healthy diet. Foods like vegetables, fruits, whole grains, low-fat dairy products, and lean protein foods contain the nutrients you need without too many calories. Decrease your intake of foods high in solid fats, added sugars, and salt. Eat the right amount of calories for you.Get information about a proper diet from your caregiver, if necessary.  Regular physical exercise is one of the most important things you can do for your health. Most adults should get at least 150 minutes of moderate-intensity exercise (any activity that increases your heart rate and causes you to sweat) each week. In addition, most adults need muscle-strengthening exercises on 2 or more days a week.  Maintain a healthy weight. The body mass index (BMI) is a screening tool to identify possible weight problems. It provides an estimate of body fat based on height and weight. Your caregiver can help determine your BMI, and can help you achieve or maintain a healthy weight.For adults 20 years and older:  A BMI below 18.5 is considered underweight.  A BMI of 18.5 to 24.9 is normal.  A BMI of 25 to 29.9 is considered overweight.  A BMI of 30 and above is  considered obese.  Maintain normal blood lipids and cholesterol levels by exercising and minimizing your intake of saturated fat. Eat a balanced diet with plenty of fruit and vegetables. Blood tests for lipids and cholesterol should begin at age 20 and be repeated every 5 years. If your lipid or cholesterol levels are high, you are over 50, or you are at high risk for heart disease, you may need your cholesterol levels checked more frequently.Ongoing high lipid and cholesterol levels should be treated with medicines if diet and exercise are not effective.  If you smoke, find out from your caregiver how to quit. If you do not use tobacco, do not start.  If you are pregnant, do not drink alcohol. If you are breastfeeding, be very cautious about drinking alcohol. If you are not pregnant and choose to drink alcohol, do not exceed 1 drink per day. One drink is considered to be 12 ounces (355 mL) of beer, 5 ounces (148 mL) of wine, or 1.5 ounces (44 mL) of liquor.  Avoid use of street drugs. Do not share needles with anyone. Ask for help if you need support or instructions about stopping the use of drugs.  High blood pressure causes heart disease and increases the risk of stroke. Your blood pressure should be checked at least every 1 to 2 years. Ongoing high blood pressure should be treated with medicines if weight loss and exercise are not effective.  If you are 55 to 59 years old, ask your caregiver if you should take aspirin to prevent strokes.  Diabetes   screening involves taking a blood sample to check your fasting blood sugar level. This should be done once every 3 years, after age 45, if you are within normal weight and without risk factors for diabetes. Testing should be considered at a younger age or be carried out more frequently if you are overweight and have at least 1 risk factor for diabetes.  Breast cancer screening is essential preventive care for women. You should practice "breast  self-awareness." This means understanding the normal appearance and feel of your breasts and may include breast self-examination. Any changes detected, no matter how small, should be reported to a caregiver. Women in their 20s and 30s should have a clinical breast exam (CBE) by a caregiver as part of a regular health exam every 1 to 3 years. After age 40, women should have a CBE every year. Starting at age 40, women should consider having a mammography (breast X-ray test) every year. Women who have a family history of breast cancer should talk to their caregiver about genetic screening. Women at a high risk of breast cancer should talk to their caregivers about having magnetic resonance imaging (MRI) and a mammography every year.  The Pap test is a screening test for cervical cancer. A Pap test can show cell changes on the cervix that might become cervical cancer if left untreated. A Pap test is a procedure in which cells are obtained and examined from the lower end of the uterus (cervix).  Women should have a Pap test starting at age 21.  Between ages 21 and 29, Pap tests should be repeated every 2 years.  Beginning at age 30, you should have a Pap test every 3 years as long as the past 3 Pap tests have been normal.  Some women have medical problems that increase the chance of getting cervical cancer. Talk to your caregiver about these problems. It is especially important to talk to your caregiver if a new problem develops soon after your last Pap test. In these cases, your caregiver may recommend more frequent screening and Pap tests.  The above recommendations are the same for women who have or have not gotten the vaccine for human papillomavirus (HPV).  If you had a hysterectomy for a problem that was not cancer or a condition that could lead to cancer, then you no longer need Pap tests. Even if you no longer need a Pap test, a regular exam is a good idea to make sure no other problems are  starting.  If you are between ages 65 and 70, and you have had normal Pap tests going back 10 years, you no longer need Pap tests. Even if you no longer need a Pap test, a regular exam is a good idea to make sure no other problems are starting.  If you have had past treatment for cervical cancer or a condition that could lead to cancer, you need Pap tests and screening for cancer for at least 20 years after your treatment.  If Pap tests have been discontinued, risk factors (such as a new sexual partner) need to be reassessed to determine if screening should be resumed.  The HPV test is an additional test that may be used for cervical cancer screening. The HPV test looks for the virus that can cause the cell changes on the cervix. The cells collected during the Pap test can be tested for HPV. The HPV test could be used to screen women aged 30 years and older, and should   be used in women of any age who have unclear Pap test results. After the age of 30, women should have HPV testing at the same frequency as a Pap test.  Colorectal cancer can be detected and often prevented. Most routine colorectal cancer screening begins at the age of 50 and continues through age 75. However, your caregiver may recommend screening at an earlier age if you have risk factors for colon cancer. On a yearly basis, your caregiver may provide home test kits to check for hidden blood in the stool. Use of a small camera at the end of a tube, to directly examine the colon (sigmoidoscopy or colonoscopy), can detect the earliest forms of colorectal cancer. Talk to your caregiver about this at age 50, when routine screening begins. Direct examination of the colon should be repeated every 5 to 10 years through age 75, unless early forms of pre-cancerous polyps or small growths are found.  Hepatitis C blood testing is recommended for all people born from 1945 through 1965 and any individual with known risks for hepatitis C.  Practice  safe sex. Use condoms and avoid high-risk sexual practices to reduce the spread of sexually transmitted infections (STIs). STIs include gonorrhea, chlamydia, syphilis, trichomonas, herpes, HPV, and human immunodeficiency virus (HIV). Herpes, HIV, and HPV are viral illnesses that have no cure. They can result in disability, cancer, and death. Sexually active women aged 25 and younger should be checked for chlamydia. Older women with new or multiple partners should also be tested for chlamydia. Testing for other STIs is recommended if you are sexually active and at increased risk.  Osteoporosis is a disease in which the bones lose minerals and strength with aging. This can result in serious bone fractures. The risk of osteoporosis can be identified using a bone density scan. Women ages 65 and over and women at risk for fractures or osteoporosis should discuss screening with their caregivers. Ask your caregiver whether you should take a calcium supplement or vitamin D to reduce the rate of osteoporosis.  Menopause can be associated with physical symptoms and risks. Hormone replacement therapy is available to decrease symptoms and risks. You should talk to your caregiver about whether hormone replacement therapy is right for you.  Use sunscreen with sun protection factor (SPF) of 30 or more. Apply sunscreen liberally and repeatedly throughout the day. You should seek shade when your shadow is shorter than you. Protect yourself by wearing long sleeves, pants, a wide-brimmed hat, and sunglasses year round, whenever you are outdoors.  Once a month, do a whole body skin exam, using a mirror to look at the skin on your back. Notify your caregiver of new moles, moles that have irregular borders, moles that are larger than a pencil eraser, or moles that have changed in shape or color.  Stay current with required immunizations.  Influenza. You need a dose every fall (or winter). The composition of the flu vaccine  changes each year, so being vaccinated once is not enough.  Pneumococcal polysaccharide. You need 1 to 2 doses if you smoke cigarettes or if you have certain chronic medical conditions. You need 1 dose at age 65 (or older) if you have never been vaccinated.  Tetanus, diphtheria, pertussis (Tdap, Td). Get 1 dose of Tdap vaccine if you are younger than age 65, are over 65 and have contact with an infant, are a healthcare worker, are pregnant, or simply want to be protected from whooping cough. After that, you need a Td   booster dose every 10 years. Consult your caregiver if you have not had at least 3 tetanus and diphtheria-containing shots sometime in your life or have a deep or dirty wound.  HPV. You need this vaccine if you are a woman age 26 or younger. The vaccine is given in 3 doses over 6 months.  Measles, mumps, rubella (MMR). You need at least 1 dose of MMR if you were born in 1957 or later. You may also need a second dose.  Meningococcal. If you are age 19 to 21 and a first-year college student living in a residence hall, or have one of several medical conditions, you need to get vaccinated against meningococcal disease. You may also need additional booster doses.  Zoster (shingles). If you are age 60 or older, you should get this vaccine.  Varicella (chickenpox). If you have never had chickenpox or you were vaccinated but received only 1 dose, talk to your caregiver to find out if you need this vaccine.  Hepatitis A. You need this vaccine if you have a specific risk factor for hepatitis A virus infection or you simply wish to be protected from this disease. The vaccine is usually given as 2 doses, 6 to 18 months apart.  Hepatitis B. You need this vaccine if you have a specific risk factor for hepatitis B virus infection or you simply wish to be protected from this disease. The vaccine is given in 3 doses, usually over 6 months. Preventive Services / Frequency Ages 19 to 39  Blood  pressure check.** / Every 1 to 2 years.  Lipid and cholesterol check.** / Every 5 years beginning at age 20.  Clinical breast exam.** / Every 3 years for women in their 20s and 30s.  Pap test.** / Every 2 years from ages 21 through 29. Every 3 years starting at age 30 through age 65 or 70 with a history of 3 consecutive normal Pap tests.  HPV screening.** / Every 3 years from ages 30 through ages 65 to 70 with a history of 3 consecutive normal Pap tests.  Hepatitis C blood test.** / For any individual with known risks for hepatitis C.  Skin self-exam. / Monthly.  Influenza immunization.** / Every year.  Pneumococcal polysaccharide immunization.** / 1 to 2 doses if you smoke cigarettes or if you have certain chronic medical conditions.  Tetanus, diphtheria, pertussis (Tdap, Td) immunization. / A one-time dose of Tdap vaccine. After that, you need a Td booster dose every 10 years.  HPV immunization. / 3 doses over 6 months, if you are 26 and younger.  Measles, mumps, rubella (MMR) immunization. / You need at least 1 dose of MMR if you were born in 1957 or later. You may also need a second dose.  Meningococcal immunization. / 1 dose if you are age 19 to 21 and a first-year college student living in a residence hall, or have one of several medical conditions, you need to get vaccinated against meningococcal disease. You may also need additional booster doses.  Varicella immunization.** / Consult your caregiver.  Hepatitis A immunization.** / Consult your caregiver. 2 doses, 6 to 18 months apart.  Hepatitis B immunization.** / Consult your caregiver. 3 doses usually over 6 months. Ages 40 to 64  Blood pressure check.** / Every 1 to 2 years.  Lipid and cholesterol check.** / Every 5 years beginning at age 20.  Clinical breast exam.** / Every year after age 40.  Mammogram.** / Every year beginning at age 40   and continuing for as long as you are in good health. Consult with your  caregiver.  Pap test.** / Every 3 years starting at age 30 through age 65 or 70 with a history of 3 consecutive normal Pap tests.  HPV screening.** / Every 3 years from ages 30 through ages 65 to 70 with a history of 3 consecutive normal Pap tests.  Fecal occult blood test (FOBT) of stool. / Every year beginning at age 50 and continuing until age 75. You may not need to do this test if you get a colonoscopy every 10 years.  Flexible sigmoidoscopy or colonoscopy.** / Every 5 years for a flexible sigmoidoscopy or every 10 years for a colonoscopy beginning at age 50 and continuing until age 75.  Hepatitis C blood test.** / For all people born from 1945 through 1965 and any individual with known risks for hepatitis C.  Skin self-exam. / Monthly.  Influenza immunization.** / Every year.  Pneumococcal polysaccharide immunization.** / 1 to 2 doses if you smoke cigarettes or if you have certain chronic medical conditions.  Tetanus, diphtheria, pertussis (Tdap, Td) immunization.** / A one-time dose of Tdap vaccine. After that, you need a Td booster dose every 10 years.  Measles, mumps, rubella (MMR) immunization. / You need at least 1 dose of MMR if you were born in 1957 or later. You may also need a second dose.  Varicella immunization.** / Consult your caregiver.  Meningococcal immunization.** / Consult your caregiver.  Hepatitis A immunization.** / Consult your caregiver. 2 doses, 6 to 18 months apart.  Hepatitis B immunization.** / Consult your caregiver. 3 doses, usually over 6 months. Ages 65 and over  Blood pressure check.** / Every 1 to 2 years.  Lipid and cholesterol check.** / Every 5 years beginning at age 20.  Clinical breast exam.** / Every year after age 40.  Mammogram.** / Every year beginning at age 40 and continuing for as long as you are in good health. Consult with your caregiver.  Pap test.** / Every 3 years starting at age 30 through age 65 or 70 with a 3  consecutive normal Pap tests. Testing can be stopped between 65 and 70 with 3 consecutive normal Pap tests and no abnormal Pap or HPV tests in the past 10 years.  HPV screening.** / Every 3 years from ages 30 through ages 65 or 70 with a history of 3 consecutive normal Pap tests. Testing can be stopped between 65 and 70 with 3 consecutive normal Pap tests and no abnormal Pap or HPV tests in the past 10 years.  Fecal occult blood test (FOBT) of stool. / Every year beginning at age 50 and continuing until age 75. You may not need to do this test if you get a colonoscopy every 10 years.  Flexible sigmoidoscopy or colonoscopy.** / Every 5 years for a flexible sigmoidoscopy or every 10 years for a colonoscopy beginning at age 50 and continuing until age 75.  Hepatitis C blood test.** / For all people born from 1945 through 1965 and any individual with known risks for hepatitis C.  Osteoporosis screening.** / A one-time screening for women ages 65 and over and women at risk for fractures or osteoporosis.  Skin self-exam. / Monthly.  Influenza immunization.** / Every year.  Pneumococcal polysaccharide immunization.** / 1 dose at age 65 (or older) if you have never been vaccinated.  Tetanus, diphtheria, pertussis (Tdap, Td) immunization. / A one-time dose of Tdap vaccine if you are over   65 and have contact with an infant, are a healthcare worker, or simply want to be protected from whooping cough. After that, you need a Td booster dose every 10 years.  Varicella immunization.** / Consult your caregiver.  Meningococcal immunization.** / Consult your caregiver.  Hepatitis A immunization.** / Consult your caregiver. 2 doses, 6 to 18 months apart.  Hepatitis B immunization.** / Check with your caregiver. 3 doses, usually over 6 months. ** Family history and personal history of risk and conditions may change your caregiver's recommendations. Document Released: 12/29/2001 Document Revised: 01/25/2012  Document Reviewed: 03/30/2011 [employer] Patient Information 2014 [employer].  

## 2013-07-14 NOTE — Progress Notes (Signed)
Subjective:     Kristin Delgado is a 57 y.o. female and is here for a comprehensive physical exam. The patient reports no problems.  History   Social History  . Marital Status: Married    Spouse Name: N/A    Number of Children: 3  . Years of Education: N/A   Occupational History  . stay at home mom    Social History Main Topics  . Smoking status: Never Smoker   . Smokeless tobacco: Never Used  . Alcohol Use: No     Comment: rare  . Drug Use: No  . Sexual Activity: Yes    Partners: Male   Other Topics Concern  . Not on file   Social History Narrative   Exercise-- Wii   Health Maintenance  Topic Date Due  . Influenza Vaccine  06/16/2013  . Mammogram  06/16/2013  . Pap Smear  06/09/2014  . Tetanus/tdap  06/09/2021  . Colonoscopy  08/11/2021    The following portions of the patient's history were reviewed and updated as appropriate:  She  has a past medical history of Hypertension; Chest pain (2006); Pituitary microadenoma; Hyperlipidemia; Allergy; Anxiety; and GERD (gastroesophageal reflux disease). She  does not have any pertinent problems on file. 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 Her family history includes Alzheimer's disease in her father; Cancer (age of onset: 54) in her mother; Dementia in her father; Lung cancer in her mother. She  reports that she has never smoked. She has never used smokeless tobacco. She reports that she does not drink alcohol or use illicit drugs. She has a current medication list which includes the following prescription(s): acetaminophen, vitamin c, bromocriptine, famotidine, hydrochlorothiazide, lisinopril, multivitamin, and potassium chloride. Current Outpatient Prescriptions on File Prior to Visit  Medication Sig Dispense Refill  . acetaminophen (TYLENOL ARTHRITIS PAIN) 650 MG CR tablet Take 650 mg by mouth every 8 (eight) hours as needed.  pain       . Ascorbic Acid (VITAMIN C) 1000 MG tablet Take 1,000 mg by mouth 2 (two) times daily.        . bromocriptine (PARLODEL) 2.5 MG tablet TAKE 1 TABLET DAILY  90 tablet  0  . famotidine (PEPCID AC) 10 MG chewable tablet Chew 10 mg by mouth daily.        . hydrochlorothiazide (HYDRODIURIL) 25 MG tablet TAKE 1 TABLET DAILY  90 tablet  0  . lisinopril (PRINIVIL,ZESTRIL) 10 MG tablet TAKE ONE-HALF (1/2) TABLET DAILY  90 tablet  0  . Multiple Vitamin (MULTIVITAMIN) tablet Take 1 tablet by mouth daily.        . potassium chloride (K-DUR,KLOR-CON) 10 MEQ tablet TAKE 1 TABLET DAILY, LABS DUE NOW  90 tablet  0   No current facility-administered medications on file prior to visit.   She has No Known Allergies..  Review of Systems Review of Systems  Constitutional: Negative for activity change, appetite change and fatigue.  HENT: Negative for hearing loss, congestion, tinnitus and ear discharge.  dentist q72m Eyes: Negative for visual disturbance (see optho q1y -- vision corrected to 20/20 with glasses).  Respiratory: Negative for cough, chest tightness and shortness of breath.   Cardiovascular: Negative for chest pain, palpitations and leg swelling.  Gastrointestinal: Negative for abdominal pain, diarrhea, constipation and abdominal distention.  Genitourinary: Negative for urgency, frequency, decreased urine volume and difficulty urinating.  Musculoskeletal: Negative for back pain, arthralgias and gait problem.  Skin: Negative for color change, pallor and rash.  Neurological: Negative for dizziness, light-headedness, numbness and headaches.  Hematological: Negative for adenopathy. Does not bruise/bleed easily.  Psychiatric/Behavioral: Negative for suicidal ideas, confusion, sleep disturbance, self-injury, dysphoric mood, decreased concentration and agitation.       Objective:    BP 140/74  Pulse 69  Temp(Src) 98.8 F (37.1 C) (Oral)  Ht 5' 4.5" (1.638 m)  Wt 201 lb 9.6 oz (91.445  kg)  BMI 34.08 kg/m2  SpO2 97% General appearance: alert, cooperative, appears stated age and no distress Head: Normocephalic, without obvious abnormality, atraumatic Eyes: conjunctivae/corneas clear. PERRL, EOM's intact. Fundi benign. Ears: normal TM's and external ear canals both ears Nose: Nares normal. Septum midline. Mucosa normal. No drainage or sinus tenderness. Throat: lips, mucosa, and tongue normal; teeth and gums normal Neck: no adenopathy, no carotid bruit, no JVD, supple, symmetrical, trachea midline and thyroid not enlarged, symmetric, no tenderness/mass/nodules Back: symmetric, no curvature. ROM normal. No CVA tenderness. Lungs: clear to auscultation bilaterally Breasts: normal appearance, no masses or tenderness Heart: S1, S2 normal and murmur Abdomen: soft, non-tender; bowel sounds normal; no masses,  no organomegaly Pelvic: pt refuses Extremities: extremities normal, atraumatic, no cyanosis or edema Pulses: 2+ and symmetric Skin: Skin color, texture, turgor normal. No rashes or lesions Lymph nodes: Cervical, supraclavicular, and axillary nodes normal. Neurologic: Alert and oriented X 3, normal strength and tone. Normal symmetric reflexes. Normal coordination and gait Psych-- no depression, no anxiety      Assessment:    Healthy female exam.      Plan:  ghm utd Check labs    See After Visit Summary for Counseling Recommendations

## 2013-07-15 DIAGNOSIS — E785 Hyperlipidemia, unspecified: Secondary | ICD-10-CM | POA: Insufficient documentation

## 2013-07-15 LAB — PROLACTIN: Prolactin: 9 ng/mL

## 2013-07-15 NOTE — Assessment & Plan Note (Signed)
Check labs 

## 2013-07-15 NOTE — Assessment & Plan Note (Signed)
Check prolactin level con't meds

## 2013-07-15 NOTE — Assessment & Plan Note (Signed)
same

## 2013-07-15 NOTE — Assessment & Plan Note (Signed)
Stable con't meds 

## 2013-07-20 ENCOUNTER — Other Ambulatory Visit: Payer: Self-pay

## 2013-07-20 MED ORDER — FENOFIBRATE 160 MG PO TABS: 160.0000 mg | ORAL_TABLET | Freq: Every day | ORAL | Status: DC

## 2013-08-15 ENCOUNTER — Ambulatory Visit
Admission: RE | Admit: 2013-08-15 | Discharge: 2013-08-15 | Disposition: A | Discharge status: Home / Self Care | Payer: 59 | Source: Ambulatory Visit | Attending: Family Medicine | Admitting: Family Medicine

## 2013-08-15 DIAGNOSIS — Z1239 Encounter for other screening for malignant neoplasm of breast: Secondary | ICD-10-CM

## 2013-08-23 ENCOUNTER — Other Ambulatory Visit: Payer: Self-pay

## 2013-08-23 MED ORDER — FENOFIBRATE 160 MG PO TABS: 160.0000 mg | ORAL_TABLET | Freq: Every day | ORAL | Status: DC

## 2013-09-21 ENCOUNTER — Other Ambulatory Visit: Payer: Self-pay

## 2013-10-23 ENCOUNTER — Other Ambulatory Visit: Payer: Self-pay | Admitting: Family Medicine

## 2013-12-25 ENCOUNTER — Other Ambulatory Visit: Payer: Self-pay | Admitting: Family Medicine

## 2014-04-22 ENCOUNTER — Other Ambulatory Visit: Payer: Self-pay | Admitting: Family Medicine

## 2014-07-16 ENCOUNTER — Ambulatory Visit (INDEPENDENT_AMBULATORY_CARE_PROVIDER_SITE_OTHER): Payer: 59 | Admitting: Family Medicine

## 2014-07-16 ENCOUNTER — Encounter: Payer: Self-pay | Admitting: Family Medicine

## 2014-07-16 VITALS — BP 130/68 | HR 64 | Temp 98.1°F | Ht 64.0 in | Wt 195.3 lb

## 2014-07-16 DIAGNOSIS — R82998 Other abnormal findings in urine: Secondary | ICD-10-CM

## 2014-07-16 DIAGNOSIS — I10 Essential (primary) hypertension: Secondary | ICD-10-CM | POA: Diagnosis not present

## 2014-07-16 DIAGNOSIS — D353 Benign neoplasm of craniopharyngeal duct: Secondary | ICD-10-CM | POA: Diagnosis not present

## 2014-07-16 DIAGNOSIS — D352 Benign neoplasm of pituitary gland: Secondary | ICD-10-CM

## 2014-07-16 DIAGNOSIS — E785 Hyperlipidemia, unspecified: Secondary | ICD-10-CM | POA: Diagnosis not present

## 2014-07-16 DIAGNOSIS — Z1239 Encounter for other screening for malignant neoplasm of breast: Secondary | ICD-10-CM

## 2014-07-16 DIAGNOSIS — Z0000 Encounter for general adult medical examination without abnormal findings: Secondary | ICD-10-CM

## 2014-07-16 DIAGNOSIS — R8290 Unspecified abnormal findings in urine: Secondary | ICD-10-CM

## 2014-07-16 LAB — CBC WITH DIFFERENTIAL/PLATELET
Basophils Absolute: 0 10*3/uL (ref 0.0–0.1)
Basophils Relative: 0.6 % (ref 0.0–3.0)
EOS ABS: 0.1 10*3/uL (ref 0.0–0.7)
Eosinophils Relative: 1.4 % (ref 0.0–5.0)
HEMATOCRIT: 37.5 % (ref 36.0–46.0)
Hemoglobin: 12.3 g/dL (ref 12.0–15.0)
Lymphocytes Relative: 27.9 % (ref 12.0–46.0)
Lymphs Abs: 1.9 10*3/uL (ref 0.7–4.0)
MCHC: 32.7 g/dL (ref 30.0–36.0)
MCV: 79.1 fl (ref 78.0–100.0)
Monocytes Absolute: 0.3 10*3/uL (ref 0.1–1.0)
Monocytes Relative: 5 % (ref 3.0–12.0)
NEUTROS ABS: 4.4 10*3/uL (ref 1.4–7.7)
Neutrophils Relative %: 65.1 % (ref 43.0–77.0)
Platelets: 198 10*3/uL (ref 150.0–400.0)
RBC: 4.74 Mil/uL (ref 3.87–5.11)
RDW: 15.1 % (ref 11.5–15.5)
WBC: 6.7 10*3/uL (ref 4.0–10.5)

## 2014-07-16 LAB — BASIC METABOLIC PANEL
BUN: 22 mg/dL (ref 6–23)
CHLORIDE: 102 meq/L (ref 96–112)
CO2: 26 meq/L (ref 19–32)
Calcium: 9.7 mg/dL (ref 8.4–10.5)
Creatinine, Ser: 0.9 mg/dL (ref 0.4–1.2)
GFR: 65.42 mL/min (ref >60.00)
Glucose, Bld: 106 mg/dL — ABNORMAL HIGH (ref 70–99)
Potassium: 3.4 mEq/L — ABNORMAL LOW (ref 3.5–5.1)
SODIUM: 140 meq/L (ref 135–145)

## 2014-07-16 LAB — HEPATIC FUNCTION PANEL
ALBUMIN: 3.9 g/dL (ref 3.5–5.2)
ALT: 20 U/L (ref 0–35)
AST: 20 U/L (ref 0–37)
Alkaline Phosphatase: 47 U/L (ref 39–117)
Bilirubin, Direct: 0 mg/dL (ref 0.0–0.3)
TOTAL PROTEIN: 7.3 g/dL (ref 6.0–8.3)
Total Bilirubin: 0.6 mg/dL (ref 0.2–1.2)

## 2014-07-16 LAB — TSH: TSH: 2.86 u[IU]/mL (ref 0.35–4.50)

## 2014-07-16 LAB — LIPID PANEL
Cholesterol: 229 mg/dL — ABNORMAL HIGH (ref 0–200)
HDL: 33.1 mg/dL — AB (ref >39.00)
NonHDL: 195.9
TRIGLYCERIDES: 241 mg/dL — AB (ref 0.0–149.0)
Total CHOL/HDL Ratio: 7
VLDL: 48.2 mg/dL — ABNORMAL HIGH (ref 0.0–40.0)

## 2014-07-16 LAB — LDL CHOLESTEROL, DIRECT: Direct LDL: 147.6 mg/dL

## 2014-07-16 MED ORDER — FENOFIBRATE 160 MG PO TABS: 160.0000 mg | ORAL_TABLET | Freq: Every day | ORAL | Status: DC

## 2014-07-16 NOTE — Progress Notes (Signed)
Pre visit review using our clinic review tool, if applicable. No additional management support is needed unless otherwise documented below in the visit note. 

## 2014-07-16 NOTE — Progress Notes (Signed)
Subjective:     Kristin Delgado is a 58 y.o. female and is here for a comprehensive physical exam. The patient reports no problems.  History   Social History  . Marital Status: Married    Spouse Name: N/A    Number of Children: 3  . Years of Education: N/A   Occupational History  . stay at home mom    Social History Main Topics  . Smoking status: Never Smoker   . Smokeless tobacco: Never Used  . Alcohol Use: No     Comment: rare  . Drug Use: No  . Sexual Activity: Yes    Partners: Male   Other Topics Concern  . Not on file   Social History Narrative   Exercise-- daily --- walking, 3x a week DVD   Health Maintenance  Topic Date Due  . Influenza Vaccine  09/15/2014 (Originally 06/16/2014)  . Pap Smear  07/17/2015 (Originally 06/09/2014)  . Mammogram  08/16/2015  . Tetanus/tdap  06/09/2021  . Colonoscopy  08/11/2021    The following portions of the patient's history were reviewed and updated as appropriate:  She  has a past medical history of Hypertension; Chest pain (2006); Pituitary microadenoma; Hyperlipidemia; Allergy; Anxiety; and GERD (gastroesophageal reflux disease). She  does not have any pertinent problems on file. 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 Her family history includes Alzheimer's disease in her father; Cancer (age of onset: 9) in her mother; Dementia in her father; Lung cancer in her mother. She  reports that she has never smoked. She has never used smokeless tobacco. She reports that she does not drink alcohol or use illicit drugs. She has a current medication list which includes the following prescription(s): acetaminophen, vitamin c, bromocriptine, famotidine, fenofibrate, hydrochlorothiazide, lisinopril, multivitamin, and potassium chloride. Current Outpatient Prescriptions on File Prior to Visit  Medication Sig Dispense Refill  . acetaminophen (TYLENOL  ARTHRITIS PAIN) 650 MG CR tablet Take 650 mg by mouth every 8 (eight) hours as needed. pain       . Ascorbic Acid (VITAMIN C) 1000 MG tablet Take 1,000 mg by mouth 2 (two) times daily.        . bromocriptine (PARLODEL) 2.5 MG tablet TAKE 1 TABLET DAILY  90 tablet  0  . famotidine (PEPCID AC) 10 MG chewable tablet Chew 10 mg by mouth daily.        . fenofibrate 160 MG tablet Take 1 tablet (160 mg total) by mouth daily.  90 tablet  1  . hydrochlorothiazide (HYDRODIURIL) 25 MG tablet TAKE 1 TABLET DAILY  90 tablet  0  . lisinopril (PRINIVIL,ZESTRIL) 10 MG tablet TAKE ONE-HALF (1/2) TABLET DAILY  90 tablet  0  . Multiple Vitamin (MULTIVITAMIN) tablet Take 1 tablet by mouth daily.        . potassium chloride (K-DUR,KLOR-CON) 10 MEQ tablet TAKE 1 TABLET DAILY, LABS DUE NOW  90 tablet  0   No current facility-administered medications on file prior to visit.   She has No Known Allergies..  Review of Systems Review of Systems  Constitutional: Negative for activity change, appetite change and fatigue.  HENT: Negative for hearing loss, congestion, tinnitus and ear discharge.  dentist q74m Eyes: Negative for visual disturbance (see optho q1y -- vision corrected to 20/20 with glasses).  Respiratory: Negative for cough, chest tightness and shortness of breath.   Cardiovascular: Negative for chest pain, palpitations and leg swelling.  Gastrointestinal:  Negative for abdominal pain, diarrhea, constipation and abdominal distention.  Genitourinary: Negative for urgency, frequency, decreased urine volume and difficulty urinating.  Musculoskeletal: Negative for back pain, arthralgias and gait problem.  Skin: Negative for color change, pallor and rash.  Neurological: Negative for dizziness, light-headedness, numbness and headaches.  Hematological: Negative for adenopathy. Does not bruise/bleed easily.  Psychiatric/Behavioral: Negative for suicidal ideas, confusion, sleep disturbance, self-injury, dysphoric mood,  decreased concentration and agitation.       Objective:    BP 130/68  Pulse 64  Temp(Src) 98.1 F (36.7 C) (Oral)  Ht 5\' 4"  (1.626 m)  Wt 195 lb 5.2 oz (88.6 kg)  BMI 33.51 kg/m2  SpO2 96% General appearance: alert, cooperative, appears stated age and no distress Head: Normocephalic, without obvious abnormality, atraumatic Eyes: negative findings: lids and lashes normal, conjunctivae and sclerae normal and pupils equal, round, reactive to light and accomodation Ears: normal TM's and external ear canals both ears  + cochlear implant Nose: Nares normal. Septum midline. Mucosa normal. No drainage or sinus tenderness. Throat: lips, mucosa, and tongue normal; teeth and gums normal Neck: no adenopathy, supple, symmetrical, trachea midline and thyroid not enlarged, symmetric, no tenderness/mass/nodules Back: symmetric, no curvature. ROM normal. No CVA tenderness. Lungs: clear to auscultation bilaterally Breasts: thickening b/l breast --- ? fibrocystic Heart: regular rate and rhythm, S1, S2 normal, no murmur, click, rub or gallop Abdomen: soft, non-tender; bowel sounds normal; no masses,  no organomegaly Pelvic: deferred -- pt preference Extremities: extremities normal, atraumatic, no cyanosis or edema Pulses: 2+ and symmetric Skin: Skin color, texture, turgor normal. No rashes or lesions Lymph nodes: Cervical, supraclavicular, and axillary nodes normal. Neurologic: Alert and oriented X 3, normal strength and tone. Normal symmetric reflexes. Normal coordination and gait Psych--no depression, no anxiety      Assessment:    Healthy female exam.      Plan:    ghm utd  Check labs See After Visit Summary for Counseling Recommendations    1. Essential hypertension Stable, con't meds - Basic metabolic panel - CBC with Differential - POCT urinalysis dipstick - TSH  2. Other and unspecified hyperlipidemia con't meds Check labs - Hepatic function panel - Lipid panel - POCT  urinalysis dipstick - TSH - fenofibrate 160 MG tablet; Take 1 tablet (160 mg total) by mouth daily.  Dispense: 90 tablet; Refill: 1  3. Preventative health care   4. Other screening breast examination  - MM DIGITAL SCREENING BILATERAL; Future  5. Pituitary adenoma  - Prolactin

## 2014-07-16 NOTE — Patient Instructions (Addendum)
Preventive Care for Adults A healthy lifestyle and preventive care can promote health and wellness. Preventive health guidelines for women include the following key practices.  A routine yearly physical is a good way to check with your health care provider about your health and preventive screening. It is a chance to share any concerns and updates on your health and to receive a thorough exam.  Visit your dentist for a routine exam and preventive care every 6 months. Brush your teeth twice a day and floss once a day. Good oral hygiene prevents tooth decay and gum disease.  The frequency of eye exams is based on your age, health, family medical history, use of contact lenses, and other factors. Follow your health care provider's recommendations for frequency of eye exams.  Eat a healthy diet. Foods like vegetables, fruits, whole grains, low-fat dairy products, and lean protein foods contain the nutrients you need without too many calories. Decrease your intake of foods high in solid fats, added sugars, and salt. Eat the right amount of calories for you.Get information about a proper diet from your health care provider, if necessary.  Regular physical exercise is one of the most important things you can do for your health. Most adults should get at least 150 minutes of moderate-intensity exercise (any activity that increases your heart rate and causes you to sweat) each week. In addition, most adults need muscle-strengthening exercises on 2 or more days a week.  Maintain a healthy weight. The body mass index (BMI) is a screening tool to identify possible weight problems. It provides an estimate of body fat based on height and weight. Your health care provider can find your BMI and can help you achieve or maintain a healthy weight.For adults 20 years and older:  A BMI below 18.5 is considered underweight.  A BMI of 18.5 to 24.9 is normal.  A BMI of 25 to 29.9 is considered overweight.  A BMI of  30 and above is considered obese.  Maintain normal blood lipids and cholesterol levels by exercising and minimizing your intake of saturated fat. Eat a balanced diet with plenty of fruit and vegetables. Blood tests for lipids and cholesterol should begin at age 76 and be repeated every 5 years. If your lipid or cholesterol levels are high, you are over 50, or you are at high risk for heart disease, you may need your cholesterol levels checked more frequently.Ongoing high lipid and cholesterol levels should be treated with medicines if diet and exercise are not working.  If you smoke, find out from your health care provider how to quit. If you do not use tobacco, do not start.  Lung cancer screening is recommended for adults aged 22-80 years who are at high risk for developing lung cancer because of a history of smoking. A yearly low-dose CT scan of the lungs is recommended for people who have at least a 30-pack-year history of smoking and are a current smoker or have quit within the past 15 years. A pack year of smoking is smoking an average of 1 pack of cigarettes a day for 1 year (for example: 1 pack a day for 30 years or 2 packs a day for 15 years). Yearly screening should continue until the smoker has stopped smoking for at least 15 years. Yearly screening should be stopped for people who develop a health problem that would prevent them from having lung cancer treatment.  If you are pregnant, do not drink alcohol. If you are breastfeeding,  be very cautious about drinking alcohol. If you are not pregnant and choose to drink alcohol, do not have more than 1 drink per day. One drink is considered to be 12 ounces (355 mL) of beer, 5 ounces (148 mL) of wine, or 1.5 ounces (44 mL) of liquor.  Avoid use of street drugs. Do not share needles with anyone. Ask for help if you need support or instructions about stopping the use of drugs.  High blood pressure causes heart disease and increases the risk of  stroke. Your blood pressure should be checked at least every 1 to 2 years. Ongoing high blood pressure should be treated with medicines if weight loss and exercise do not work.  If you are 3-86 years old, ask your health care provider if you should take aspirin to prevent strokes.  Diabetes screening involves taking a blood sample to check your fasting blood sugar level. This should be done once every 3 years, after age 67, if you are within normal weight and without risk factors for diabetes. Testing should be considered at a younger age or be carried out more frequently if you are overweight and have at least 1 risk factor for diabetes.  Breast cancer screening is essential preventive care for women. You should practice "breast self-awareness." This means understanding the normal appearance and feel of your breasts and may include breast self-examination. Any changes detected, no matter how small, should be reported to a health care provider. Women in their 8s and 30s should have a clinical breast exam (CBE) by a health care provider as part of a regular health exam every 1 to 3 years. After age 70, women should have a CBE every year. Starting at age 25, women should consider having a mammogram (breast X-ray test) every year. Women who have a family history of breast cancer should talk to their health care provider about genetic screening. Women at a high risk of breast cancer should talk to their health care providers about having an MRI and a mammogram every year.  Breast cancer gene (BRCA)-related cancer risk assessment is recommended for women who have family members with BRCA-related cancers. BRCA-related cancers include breast, ovarian, tubal, and peritoneal cancers. Having family members with these cancers may be associated with an increased risk for harmful changes (mutations) in the breast cancer genes BRCA1 and BRCA2. Results of the assessment will determine the need for genetic counseling and  BRCA1 and BRCA2 testing.  Routine pelvic exams to screen for cancer are no longer recommended for nonpregnant women who are considered low risk for cancer of the pelvic organs (ovaries, uterus, and vagina) and who do not have symptoms. Ask your health care provider if a screening pelvic exam is right for you.  If you have had past treatment for cervical cancer or a condition that could lead to cancer, you need Pap tests and screening for cancer for at least 20 years after your treatment. If Pap tests have been discontinued, your risk factors (such as having a new sexual partner) need to be reassessed to determine if screening should be resumed. Some women have medical problems that increase the chance of getting cervical cancer. In these cases, your health care provider may recommend more frequent screening and Pap tests.  The HPV test is an additional test that may be used for cervical cancer screening. The HPV test looks for the virus that can cause the cell changes on the cervix. The cells collected during the Pap test can be  tested for HPV. The HPV test could be used to screen women aged 30 years and older, and should be used in women of any age who have unclear Pap test results. After the age of 30, women should have HPV testing at the same frequency as a Pap test.  Colorectal cancer can be detected and often prevented. Most routine colorectal cancer screening begins at the age of 50 years and continues through age 75 years. However, your health care provider may recommend screening at an earlier age if you have risk factors for colon cancer. On a yearly basis, your health care provider may provide home test kits to check for hidden blood in the stool. Use of a small camera at the end of a tube, to directly examine the colon (sigmoidoscopy or colonoscopy), can detect the earliest forms of colorectal cancer. Talk to your health care provider about this at age 50, when routine screening begins. Direct  exam of the colon should be repeated every 5-10 years through age 75 years, unless early forms of pre-cancerous polyps or small growths are found.  People who are at an increased risk for hepatitis B should be screened for this virus. You are considered at high risk for hepatitis B if:  You were born in a country where hepatitis B occurs often. Talk with your health care provider about which countries are considered high risk.  Your parents were born in a high-risk country and you have not received a shot to protect against hepatitis B (hepatitis B vaccine).  You have HIV or AIDS.  You use needles to inject street drugs.  You live with, or have sex with, someone who has hepatitis B.  You get hemodialysis treatment.  You take certain medicines for conditions like cancer, organ transplantation, and autoimmune conditions.  Hepatitis C blood testing is recommended for all people born from 1945 through 1965 and any individual with known risks for hepatitis C.  Practice safe sex. Use condoms and avoid high-risk sexual practices to reduce the spread of sexually transmitted infections (STIs). STIs include gonorrhea, chlamydia, syphilis, trichomonas, herpes, HPV, and human immunodeficiency virus (HIV). Herpes, HIV, and HPV are viral illnesses that have no cure. They can result in disability, cancer, and death.  You should be screened for sexually transmitted illnesses (STIs) including gonorrhea and chlamydia if:  You are sexually active and are younger than 24 years.  You are older than 24 years and your health care provider tells you that you are at risk for this type of infection.  Your sexual activity has changed since you were last screened and you are at an increased risk for chlamydia or gonorrhea. Ask your health care provider if you are at risk.  If you are at risk of being infected with HIV, it is recommended that you take a prescription medicine daily to prevent HIV infection. This is  called preexposure prophylaxis (PrEP). You are considered at risk if:  You are a heterosexual woman, are sexually active, and are at increased risk for HIV infection.  You take drugs by injection.  You are sexually active with a partner who has HIV.  Talk with your health care provider about whether you are at high risk of being infected with HIV. If you choose to begin PrEP, you should first be tested for HIV. You should then be tested every 3 months for as long as you are taking PrEP.  Osteoporosis is a disease in which the bones lose minerals and strength   with aging. This can result in serious bone fractures or breaks. The risk of osteoporosis can be identified using a bone density scan. Women ages 79 years and over and women at risk for fractures or osteoporosis should discuss screening with their health care providers. Ask your health care provider whether you should take a calcium supplement or vitamin D to reduce the rate of osteoporosis.  Menopause can be associated with physical symptoms and risks. Hormone replacement therapy is available to decrease symptoms and risks. You should talk to your health care provider about whether hormone replacement therapy is right for you.  Use sunscreen. Apply sunscreen liberally and repeatedly throughout the day. You should seek shade when your shadow is shorter than you. Protect yourself by wearing long sleeves, pants, a wide-brimmed hat, and sunglasses year round, whenever you are outdoors.  Once a month, do a whole body skin exam, using a mirror to look at the skin on your back. Tell your health care provider of new moles, moles that have irregular borders, moles that are larger than a pencil eraser, or moles that have changed in shape or color.  Stay current with required vaccines (immunizations).  Influenza vaccine. All adults should be immunized every year.  Tetanus, diphtheria, and acellular pertussis (Td, Tdap) vaccine. Pregnant women should  receive 1 dose of Tdap vaccine during each pregnancy. The dose should be obtained regardless of the length of time since the last dose. Immunization is preferred during the 27th-36th week of gestation. An adult who has not previously received Tdap or who does not know her vaccine status should receive 1 dose of Tdap. This initial dose should be followed by tetanus and diphtheria toxoids (Td) booster doses every 10 years. Adults with an unknown or incomplete history of completing a 3-dose immunization series with Td-containing vaccines should begin or complete a primary immunization series including a Tdap dose. Adults should receive a Td booster every 10 years.  Varicella vaccine. An adult without evidence of immunity to varicella should receive 2 doses or a second dose if she has previously received 1 dose. Pregnant females who do not have evidence of immunity should receive the first dose after pregnancy. This first dose should be obtained before leaving the health care facility. The second dose should be obtained 4-8 weeks after the first dose.  Human papillomavirus (HPV) vaccine. Females aged 13-26 years who have not received the vaccine previously should obtain the 3-dose series. The vaccine is not recommended for use in pregnant females. However, pregnancy testing is not needed before receiving a dose. If a female is found to be pregnant after receiving a dose, no treatment is needed. In that case, the remaining doses should be delayed until after the pregnancy. Immunization is recommended for any person with an immunocompromised condition through the age of 68 years if she did not get any or all doses earlier. During the 3-dose series, the second dose should be obtained 4-8 weeks after the first dose. The third dose should be obtained 24 weeks after the first dose and 16 weeks after the second dose.  Zoster vaccine. One dose is recommended for adults aged 65 years or older unless certain conditions are  present.  Measles, mumps, and rubella (MMR) vaccine. Adults born before [birth year] generally are considered immune to measles and mumps. Adults born in [birth year] or later should have 1 or more doses of MMR vaccine unless there is a contraindication to the vaccine or there is laboratory evidence of immunity to  each of the three diseases. A routine second dose of MMR vaccine should be obtained at least 28 days after the first dose for students attending postsecondary schools, health care workers, or international travelers. People who received inactivated measles vaccine or an unknown type of measles vaccine during 1963-1967 should receive 2 doses of MMR vaccine. People who received inactivated mumps vaccine or an unknown type of mumps vaccine before 1979 and are at high risk for mumps infection should consider immunization with 2 doses of MMR vaccine. For females of childbearing age, rubella immunity should be determined. If there is no evidence of immunity, females who are not pregnant should be vaccinated. If there is no evidence of immunity, females who are pregnant should delay immunization until after pregnancy. Unvaccinated health care workers born before [birth year] who lack laboratory evidence of measles, mumps, or rubella immunity or laboratory confirmation of disease should consider measles and mumps immunization with 2 doses of MMR vaccine or rubella immunization with 1 dose of MMR vaccine.  Pneumococcal 13-valent conjugate (PCV13) vaccine. When indicated, a person who is uncertain of her immunization history and has no record of immunization should receive the PCV13 vaccine. An adult aged 69 years or older who has certain medical conditions and has not been previously immunized should receive 1 dose of PCV13 vaccine. This PCV13 should be followed with a dose of pneumococcal polysaccharide (PPSV23) vaccine. The PPSV23 vaccine dose should be obtained at least 8 weeks after the dose of PCV13 vaccine. An adult aged 54  years or older who has certain medical conditions and previously received 1 or more doses of PPSV23 vaccine should receive 1 dose of PCV13. The PCV13 vaccine dose should be obtained 1 or more years after the last PPSV23 vaccine dose.  Pneumococcal polysaccharide (PPSV23) vaccine. When PCV13 is also indicated, PCV13 should be obtained first. All adults aged 61 years and older should be immunized. An adult younger than age 46 years who has certain medical conditions should be immunized. Any person who resides in a nursing home or long-term care facility should be immunized. An adult smoker should be immunized. People with an immunocompromised condition and certain other conditions should receive both PCV13 and PPSV23 vaccines. People with human immunodeficiency virus (HIV) infection should be immunized as soon as possible after diagnosis. Immunization during chemotherapy or radiation therapy should be avoided. Routine use of PPSV23 vaccine is not recommended for American Indians, [place] Natives, or people younger than 65 years unless there are medical conditions that require PPSV23 vaccine. When indicated, people who have unknown immunization and have no record of immunization should receive PPSV23 vaccine. One-time revaccination 5 years after the first dose of PPSV23 is recommended for people aged 19-64 years who have chronic kidney failure, nephrotic syndrome, asplenia, or immunocompromised conditions. People who received 1-2 doses of PPSV23 before age 29 years should receive another dose of PPSV23 vaccine at age 8 years or later if at least 5 years have passed since the previous dose. Doses of PPSV23 are not needed for people immunized with PPSV23 at or after age 71 years.  Meningococcal vaccine. Adults with asplenia or persistent complement component deficiencies should receive 2 doses of quadrivalent meningococcal conjugate (MenACWY-D) vaccine. The doses should be obtained at least 2 months apart.  Microbiologists working with certain meningococcal bacteria, [employer] recruits, people at risk during an outbreak, and people who travel to or live in countries with a high rate of meningitis should be immunized. A first-year college student up through age  21 years who is living in a residence hall should receive a dose if she did not receive a dose on or after her 16th birthday. Adults who have certain high-risk conditions should receive one or more doses of vaccine.  Hepatitis A vaccine. Adults who wish to be protected from this disease, have certain high-risk conditions, work with hepatitis A-infected animals, work in hepatitis A research labs, or travel to or work in countries with a high rate of hepatitis A should be immunized. Adults who were previously unvaccinated and who anticipate close contact with an international adoptee during the first 60 days after arrival in the [place] from a country with a high rate of hepatitis A should be immunized.  Hepatitis B vaccine. Adults who wish to be protected from this disease, have certain high-risk conditions, may be exposed to blood or other infectious body fluids, are household contacts or sex partners of hepatitis B positive people, are clients or workers in certain care facilities, or travel to or work in countries with a high rate of hepatitis B should be immunized.  Haemophilus influenzae type b (Hib) vaccine. A previously unvaccinated person with asplenia or sickle cell disease or having a scheduled splenectomy should receive 1 dose of Hib vaccine. Regardless of previous immunization, a recipient of a hematopoietic stem cell transplant should receive a 3-dose series 6-12 months after her successful transplant. Hib vaccine is not recommended for adults with HIV infection. Preventive Services / Frequency Ages 91 to 40 years  Blood pressure check.** / Every 1 to 2 years.  Lipid and cholesterol check.** / Every 5 years beginning at age  30.  Clinical breast exam.** / Every 3 years for women in their 66s and 60s.  BRCA-related cancer risk assessment.** / For women who have family members with a BRCA-related cancer (breast, ovarian, tubal, or peritoneal cancers).  Pap test.** / Every 2 years from ages 62 through 2. Every 3 years starting at age 15 through age 73 or 75 with a history of 3 consecutive normal Pap tests.  HPV screening.** / Every 3 years from ages 92 through ages 52 to 50 with a history of 3 consecutive normal Pap tests.  Hepatitis C blood test.** / For any individual with known risks for hepatitis C.  Skin self-exam. / Monthly.  Influenza vaccine. / Every year.  Tetanus, diphtheria, and acellular pertussis (Tdap, Td) vaccine.** / Consult your health care provider. Pregnant women should receive 1 dose of Tdap vaccine during each pregnancy. 1 dose of Td every 10 years.  Varicella vaccine.** / Consult your health care provider. Pregnant females who do not have evidence of immunity should receive the first dose after pregnancy.  HPV vaccine. / 3 doses over 6 months, if 51 and younger. The vaccine is not recommended for use in pregnant females. However, pregnancy testing is not needed before receiving a dose.  Measles, mumps, rubella (MMR) vaccine.** / You need at least 1 dose of MMR if you were born in 1957 or later. You may also need a 2nd dose. For females of childbearing age, rubella immunity should be determined. If there is no evidence of immunity, females who are not pregnant should be vaccinated. If there is no evidence of immunity, females who are pregnant should delay immunization until after pregnancy.  Pneumococcal 13-valent conjugate (PCV13) vaccine.** / Consult your health care provider.  Pneumococcal polysaccharide (PPSV23) vaccine.** / 1 to 2 doses if you smoke cigarettes or if you have certain conditions.  Meningococcal vaccine.** /  1 dose if you are age 39 to 52 years and a [place] living in a residence hall, or have one of several medical conditions, you need to get vaccinated against meningococcal disease. You may also need additional booster doses.  Hepatitis A vaccine.** / Consult your health care provider.  Hepatitis B vaccine.** / Consult your health care provider.  Haemophilus influenzae type b (Hib) vaccine.** / Consult your health care provider. Ages 87 to 57 years  Blood pressure check.** / Every 1 to 2 years.  Lipid and cholesterol check.** / Every 5 years beginning at age 3 years.  Lung cancer screening. / Every year if you are aged 64-80 years and have a 30-pack-year history of smoking and currently smoke or have quit within the past 15 years. Yearly screening is stopped once you have quit smoking for at least 15 years or develop a health problem that would prevent you from having lung cancer treatment.  Clinical breast exam.** / Every year after age 46 years.  BRCA-related cancer risk assessment.** / For women who have family members with a BRCA-related cancer (breast, ovarian, tubal, or peritoneal cancers).  Mammogram.** / Every year beginning at age 35 years and continuing for as long as you are in good health. Consult with your health care provider.  Pap test.** / Every 3 years starting at age 18 years through age 52 or 8 years with a history of 3 consecutive normal Pap tests.  HPV screening.** / Every 3 years from ages 61 years through ages 33 to 3 years with a history of 3 consecutive normal Pap tests.  Fecal occult blood test (FOBT) of stool. / Every year beginning at age 89 years and continuing until age 29 years. You may not need to do this test if you get a colonoscopy every 10 years.  Flexible sigmoidoscopy or colonoscopy.** / Every 5 years for a flexible sigmoidoscopy or every 10 years for a colonoscopy beginning at age 73 years and continuing until age 37 years.  Hepatitis C blood test.** / For all people born from [birth year] through  1965 and any individual with known risks for hepatitis C.  Skin self-exam. / Monthly.  Influenza vaccine. / Every year.  Tetanus, diphtheria, and acellular pertussis (Tdap/Td) vaccine.** / Consult your health care provider. Pregnant women should receive 1 dose of Tdap vaccine during each pregnancy. 1 dose of Td every 10 years.  Varicella vaccine.** / Consult your health care provider. Pregnant females who do not have evidence of immunity should receive the first dose after pregnancy.  Zoster vaccine.** / 1 dose for adults aged 5 years or older.  Measles, mumps, rubella (MMR) vaccine.** / You need at least 1 dose of MMR if you were born in 1957 or later. You may also need a 2nd dose. For females of childbearing age, rubella immunity should be determined. If there is no evidence of immunity, females who are not pregnant should be vaccinated. If there is no evidence of immunity, females who are pregnant should delay immunization until after pregnancy.  Pneumococcal 13-valent conjugate (PCV13) vaccine.** / Consult your health care provider.  Pneumococcal polysaccharide (PPSV23) vaccine.** / 1 to 2 doses if you smoke cigarettes or if you have certain conditions.  Meningococcal vaccine.** / Consult your health care provider.  Hepatitis A vaccine.** / Consult your health care provider.  Hepatitis B vaccine.** / Consult your health care provider.  Haemophilus influenzae type b (Hib) vaccine.** / Consult your health care provider. Ages 16  years and over  Blood pressure check.** / Every 1 to 2 years.  Lipid and cholesterol check.** / Every 5 years beginning at age 20 years.  Lung cancer screening. / Every year if you are aged 55-80 years and have a 30-pack-year history of smoking and currently smoke or have quit within the past 15 years. Yearly screening is stopped once you have quit smoking for at least 15 years or develop a health problem that would prevent you from having lung cancer  treatment.  Clinical breast exam.** / Every year after age 40 years.  BRCA-related cancer risk assessment.** / For women who have family members with a BRCA-related cancer (breast, ovarian, tubal, or peritoneal cancers).  Mammogram.** / Every year beginning at age 40 years and continuing for as long as you are in good health. Consult with your health care provider.  Pap test.** / Every 3 years starting at age 30 years through age 65 or 70 years with 3 consecutive normal Pap tests. Testing can be stopped between 65 and 70 years with 3 consecutive normal Pap tests and no abnormal Pap or HPV tests in the past 10 years.  HPV screening.** / Every 3 years from ages 30 years through ages 65 or 70 years with a history of 3 consecutive normal Pap tests. Testing can be stopped between 65 and 70 years with 3 consecutive normal Pap tests and no abnormal Pap or HPV tests in the past 10 years.  Fecal occult blood test (FOBT) of stool. / Every year beginning at age 50 years and continuing until age 75 years. You may not need to do this test if you get a colonoscopy every 10 years.  Flexible sigmoidoscopy or colonoscopy.** / Every 5 years for a flexible sigmoidoscopy or every 10 years for a colonoscopy beginning at age 50 years and continuing until age 75 years.  Hepatitis C blood test.** / For all people born from 1945 through 1965 and any individual with known risks for hepatitis C.  Osteoporosis screening.** / A one-time screening for women ages 65 years and over and women at risk for fractures or osteoporosis.  Skin self-exam. / Monthly.  Influenza vaccine. / Every year.  Tetanus, diphtheria, and acellular pertussis (Tdap/Td) vaccine.** / 1 dose of Td every 10 years.  Varicella vaccine.** / Consult your health care provider.  Zoster vaccine.** / 1 dose for adults aged 60 years or older.  Pneumococcal 13-valent conjugate (PCV13) vaccine.** / Consult your health care provider.  Pneumococcal  polysaccharide (PPSV23) vaccine.** / 1 dose for all adults aged 65 years and older.  Meningococcal vaccine.** / Consult your health care provider.  Hepatitis A vaccine.** / Consult your health care provider.  Hepatitis B vaccine.** / Consult your health care provider.  Haemophilus influenzae type b (Hib) vaccine.** / Consult your health care provider. ** Family history and personal history of risk and conditions may change your health care provider's recommendations. Document Released: 12/29/2001 Document Revised: 03/19/2014 Document Reviewed: 03/30/2011 [employer] Patient Information 2015 [employer]. This information is not intended to replace advice given to you by your health care provider. Make sure you discuss any questions you have with your health care provider.   

## 2014-07-17 ENCOUNTER — Other Ambulatory Visit: Payer: Self-pay | Admitting: Family Medicine

## 2014-07-17 DIAGNOSIS — E785 Hyperlipidemia, unspecified: Secondary | ICD-10-CM

## 2014-07-17 DIAGNOSIS — R82998 Other abnormal findings in urine: Secondary | ICD-10-CM | POA: Diagnosis not present

## 2014-07-17 LAB — POCT URINALYSIS DIPSTICK
Bilirubin, UA: NEGATIVE
Glucose, UA: NEGATIVE
KETONES UA: NEGATIVE
Nitrite, UA: NEGATIVE
Protein, UA: NEGATIVE
RBC UA: NEGATIVE
Spec Grav, UA: 1.02
Urobilinogen, UA: 0.2
pH, UA: 5

## 2014-07-17 NOTE — Addendum Note (Signed)
Addended by: Harl Bowie on: 07/17/2014 12:14 PM   Modules accepted: Orders

## 2014-07-18 ENCOUNTER — Ambulatory Visit: Payer: 59 | Admitting: Pharmacist

## 2014-07-18 LAB — URINE CULTURE
Colony Count: NO GROWTH
Organism ID, Bacteria: NO GROWTH

## 2014-07-25 ENCOUNTER — Other Ambulatory Visit: Payer: Self-pay | Admitting: Family Medicine

## 2014-08-22 ENCOUNTER — Ambulatory Visit
Admission: RE | Admit: 2014-08-22 | Discharge: 2014-08-22 | Disposition: A | Discharge status: Home / Self Care | Payer: Medicare Other | Source: Ambulatory Visit | Attending: Family Medicine | Admitting: Family Medicine

## 2014-08-22 DIAGNOSIS — Z1239 Encounter for other screening for malignant neoplasm of breast: Secondary | ICD-10-CM

## 2014-09-17 ENCOUNTER — Encounter: Payer: Self-pay | Admitting: Family Medicine

## 2015-01-14 ENCOUNTER — Ambulatory Visit: Payer: 59 | Admitting: Family Medicine

## 2015-01-22 ENCOUNTER — Ambulatory Visit: Payer: 59 | Admitting: Family Medicine

## 2015-01-22 ENCOUNTER — Encounter: Payer: Self-pay | Admitting: Family Medicine

## 2015-01-22 ENCOUNTER — Ambulatory Visit (INDEPENDENT_AMBULATORY_CARE_PROVIDER_SITE_OTHER): Payer: 59 | Admitting: Family Medicine

## 2015-01-22 VITALS — BP 138/82 | HR 72 | Temp 98.3°F | Wt 208.0 lb

## 2015-01-22 DIAGNOSIS — E785 Hyperlipidemia, unspecified: Secondary | ICD-10-CM

## 2015-01-22 DIAGNOSIS — D353 Benign neoplasm of craniopharyngeal duct: Secondary | ICD-10-CM

## 2015-01-22 DIAGNOSIS — D352 Benign neoplasm of pituitary gland: Secondary | ICD-10-CM | POA: Diagnosis not present

## 2015-01-22 DIAGNOSIS — I10 Essential (primary) hypertension: Secondary | ICD-10-CM

## 2015-01-22 DIAGNOSIS — D369 Benign neoplasm, unspecified site: Secondary | ICD-10-CM

## 2015-01-22 NOTE — Assessment & Plan Note (Signed)
Con' t fenofibrate Check labs

## 2015-01-22 NOTE — Progress Notes (Signed)
Pre visit review using our clinic review tool, if applicable. No additional management support is needed unless otherwise documented below in the visit note. 

## 2015-01-22 NOTE — Assessment & Plan Note (Addendum)
Stable con't lisinopril and hct  

## 2015-01-22 NOTE — Assessment & Plan Note (Signed)
Check prolactin level

## 2015-01-22 NOTE — Progress Notes (Signed)
Patient ID: Kristin Delgado, female    DOB: 1956/04/04  Age: 59 y.o. MRN: [id number]    Subjective:  Subjective HPI [name] presents for f/u bp and cholesterol  Review of Systems  Constitutional: Negative for activity change, appetite change, fatigue and unexpected weight change.  Respiratory: Negative for cough and shortness of breath.   Cardiovascular: Negative for chest pain and palpitations.  Psychiatric/Behavioral: Negative for behavioral problems and dysphoric mood. The patient is not nervous/anxious.     History Past Medical History  Diagnosis Date  . Hypertension   . Chest pain 2006    admitted, essentially negative  . Pituitary microadenoma   . Hyperlipidemia   . Allergy     seasonal  . Anxiety     closed space like MRI  . 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   Her family history includes Alzheimer's disease in her father; Cancer (age of onset: 84) in her mother; Dementia in her father; Lung cancer in her mother.She reports that she has never smoked. She has never used smokeless tobacco. She reports that she does not drink alcohol or use illicit drugs.  Current Outpatient Prescriptions on File Prior to Visit  Medication Sig Dispense Refill  . acetaminophen (TYLENOL ARTHRITIS PAIN) 650 MG CR tablet Take 650 mg by mouth every 8 (eight) hours as needed. pain     . Ascorbic Acid (VITAMIN C) 1000 MG tablet Take 1,000 mg by mouth 2 (two) times daily.      . bromocriptine (PARLODEL) 2.5 MG tablet TAKE 1 TABLET DAILY 90 tablet 1  . famotidine (PEPCID AC) 10 MG chewable tablet Chew 10 mg by mouth daily.      . hydrochlorothiazide (HYDRODIURIL) 25 MG tablet TAKE 1 TABLET DAILY 90 tablet 1  . lisinopril (PRINIVIL,ZESTRIL) 10 MG tablet TAKE ONE-HALF (1/2) TABLET DAILY 45 tablet 1  . Multiple Vitamin (MULTIVITAMIN) tablet Take 1 tablet by  mouth daily.      . potassium chloride (K-DUR,KLOR-CON) 10 MEQ tablet TAKE 1 TABLET DAILY, LABS DUE NOW 90 tablet 0  . fenofibrate 160 MG tablet Take 1 tablet (160 mg total) by mouth daily. (Patient not taking: Reported on 01/22/2015) 90 tablet 1   No current facility-administered medications on file prior to visit.     Objective:  Objective Physical Exam  Constitutional: She is oriented to person, place, and time. She appears well-developed and well-nourished. No distress.  HENT:  Right Ear: External ear normal.  Left Ear: External ear normal.  Nose: Nose normal.  Mouth/Throat: Oropharynx is clear and moist.  Eyes: EOM are normal. Pupils are equal, round, and reactive to light.  Neck: Normal range of motion. Neck supple.  Cardiovascular: Normal rate, regular rhythm and normal heart sounds.   No murmur heard. Pulmonary/Chest: Effort normal and breath sounds normal. No respiratory distress. She has no wheezes. She has no rales. She exhibits no tenderness.  Neurological: She is alert and oriented to person, place, and time.  Psychiatric: She has a normal mood and affect. Her behavior is normal. Judgment and thought content normal.   BP 138/82 mmHg  Pulse 72  Temp(Src) 98.3 F (36.8 C) (Oral)  Wt 208 lb (94.348 kg)  SpO2 95% Wt Readings from Last 3 Encounters:  01/22/15 208 lb (94.348 kg)  07/16/14 195 lb 5.2 oz (88.6 kg)  07/14/13 201 lb 9.6 oz (91.445 kg)  Lab Results  Component Value Date   WBC 6.7 07/16/2014   HGB 12.3 07/16/2014   HCT 37.5 07/16/2014   PLT 198.0 07/16/2014   GLUCOSE 106* 07/16/2014   CHOL 229* 07/16/2014   TRIG 241.0* 07/16/2014   HDL 33.10* 07/16/2014   LDLDIRECT 147.6 07/16/2014   ALT 20 07/16/2014   AST 20 07/16/2014   NA 140 07/16/2014   K 3.4* 07/16/2014   CL 102 07/16/2014   CREATININE 0.9 07/16/2014   BUN 22 07/16/2014   CO2 26 07/16/2014   TSH 2.86 07/16/2014    Mm Digital Screening Bilateral  08/24/2014   CLINICAL DATA:   Screening.  EXAM: DIGITAL SCREENING BILATERAL MAMMOGRAM WITH CAD  COMPARISON:  Previous exam(s).  ACR Breast Density Category b: There are scattered areas of fibroglandular density.  FINDINGS: There are no findings suspicious for malignancy. Images were processed with CAD.  IMPRESSION: No mammographic evidence of malignancy. A result letter of this screening mammogram will be mailed directly to the patient.  RECOMMENDATION: Screening mammogram in one year. (Code:SM-B-01Y)  BI-RADS CATEGORY  1: Negative.   Electronically Signed   By: Curlene Dolphin M.D.   On: 08/24/2014 11:22     Assessment & Plan:  Plan I am having Ms. Langer maintain her famotidine, multivitamin, vitamin C, acetaminophen, potassium chloride, fenofibrate, lisinopril, hydrochlorothiazide, and bromocriptine.  No orders of the defined types were placed in this encounter.    Problem List Items Addressed This Visit    None    Visit Diagnoses    Essential hypertension    -  Primary    Relevant Orders    Basic metabolic panel    Hyperlipidemia        Relevant Orders    Hepatic function panel    Lipid panel    Microadenoma        Relevant Orders    Prolactin       Follow-up: Return in about 6 months (around 07/25/2015), or if symptoms worsen or fail to improve, for f/u and labs.  Kristin Koyanagi, DO

## 2015-01-22 NOTE — Patient Instructions (Signed)

## 2015-01-23 LAB — BASIC METABOLIC PANEL
BUN: 18 mg/dL (ref 6–23)
CO2: 27 meq/L (ref 19–32)
Calcium: 10.1 mg/dL (ref 8.4–10.5)
Chloride: 103 mEq/L (ref 96–112)
Creatinine, Ser: 0.87 mg/dL (ref 0.40–1.20)
GFR: 70.52 mL/min (ref >60.00)
GLUCOSE: 100 mg/dL — AB (ref 70–99)
POTASSIUM: 3.7 meq/L (ref 3.5–5.1)
Sodium: 138 mEq/L (ref 135–145)

## 2015-01-23 LAB — HEPATIC FUNCTION PANEL
ALK PHOS: 53 U/L (ref 39–117)
ALT: 29 U/L (ref 0–35)
AST: 21 U/L (ref 0–37)
Albumin: 4.3 g/dL (ref 3.5–5.2)
Bilirubin, Direct: 0 mg/dL (ref 0.0–0.3)
Total Bilirubin: 0.2 mg/dL (ref 0.2–1.2)
Total Protein: 7.3 g/dL (ref 6.0–8.3)

## 2015-01-23 LAB — LIPID PANEL
CHOL/HDL RATIO: 7
Cholesterol: 255 mg/dL — ABNORMAL HIGH (ref 0–200)
HDL: 37.5 mg/dL — AB (ref >39.00)
Triglycerides: 485 mg/dL — ABNORMAL HIGH (ref 0.0–149.0)

## 2015-01-23 LAB — LDL CHOLESTEROL, DIRECT: Direct LDL: 160 mg/dL

## 2015-01-23 LAB — PROLACTIN: Prolactin: 8.6 ng/mL

## 2015-01-24 ENCOUNTER — Ambulatory Visit: Payer: 59 | Admitting: Family Medicine

## 2015-01-25 ENCOUNTER — Other Ambulatory Visit: Payer: Self-pay | Admitting: Family Medicine

## 2015-01-31 ENCOUNTER — Other Ambulatory Visit: Payer: Self-pay

## 2015-01-31 DIAGNOSIS — E785 Hyperlipidemia, unspecified: Secondary | ICD-10-CM

## 2015-02-26 ENCOUNTER — Ambulatory Visit (INDEPENDENT_AMBULATORY_CARE_PROVIDER_SITE_OTHER): Payer: Medicare Other | Admitting: Pharmacist

## 2015-02-26 DIAGNOSIS — E785 Hyperlipidemia, unspecified: Secondary | ICD-10-CM

## 2015-02-26 MED ORDER — FENOFIBRATE 160 MG PO TABS: 160.0000 mg | ORAL_TABLET | Freq: Every day | ORAL | Status: DC

## 2015-02-26 NOTE — Progress Notes (Signed)
HPI  Kristin Delgado is a 58 yo F pt of Dr. Etter Sjogren who was referred to the lipid clinic for elevated TG.  Pt has a PMH significant for HTN.  She has no history of CAD or DM.  She has no family history of cardiac disease.  She is not a smoker.  Her 10-year ASCVD risk is 8.0%. At her last visit with Dr. Etter Sjogren, she reported not taking her fenofibrate because she was out of it from the pharmacy.  She has never had any problems taking it in the past.  She has never tried any other cholesterol medications.  She has also added fish oil 1200mg  BID since that time.   RF: HTN Goals: LDL <160 (<130 optimal) Current Meds: fenofibrate 160mg  daily  Intolerances: none  Labs:  01/2015- TC 255, TG 485, HDL 38, LDL 160 (not fasting and not on any medications)  Current Outpatient Prescriptions  Medication Sig Dispense Refill  . acetaminophen (TYLENOL ARTHRITIS PAIN) 650 MG CR tablet Take 650 mg by mouth every 8 (eight) hours as needed. pain     . Ascorbic Acid (VITAMIN C) 1000 MG tablet Take 1,000 mg by mouth 2 (two) times daily.      . bromocriptine (PARLODEL) 2.5 MG tablet TAKE 1 TABLET DAILY 90 tablet 1  . famotidine (PEPCID AC) 10 MG chewable tablet Chew 10 mg by mouth daily.      . fenofibrate 160 MG tablet Take 1 tablet (160 mg total) by mouth daily. 90 tablet 1  . hydrochlorothiazide (HYDRODIURIL) 25 MG tablet TAKE 1 TABLET DAILY 90 tablet 1  . lisinopril (PRINIVIL,ZESTRIL) 10 MG tablet TAKE ONE-HALF (1/2) TABLET DAILY 45 tablet 1  . Multiple Vitamin (MULTIVITAMIN) tablet Take 1 tablet by mouth daily.      . potassium chloride (K-DUR,KLOR-CON) 10 MEQ tablet TAKE 1 TABLET DAILY, LABS DUE NOW 90 tablet 0   No current facility-administered medications for this visit.   No Known Allergies

## 2015-02-26 NOTE — Assessment & Plan Note (Signed)
Pt's TG likely elevated due to not fasting and being off fenofibrate.  She has since restarted her fenofibrate.  In the past, this has controlled her LDL and non-HDL.  Given her risk, she does qualify for statin.  Discussed fenofibrate versus statin with patient including the outcomes related to primary prevention.  She would prefer to stay on fenofibrate at this time.  Will plan for her to check labs in June as previously scheduled with Dr. Etter Sjogren.  If she requires any additional therapy, will see her again otherwise would continue with fenofibrate and fish oil.

## 2015-02-26 NOTE — Patient Instructions (Signed)
Continue taking your fenofibrate 160mg  once daily with food.  Will check your cholesterol in June as previously planned with Dr. Etter Sjogren.   If you have any problems, please call Gay Filler at [phone]

## 2015-05-02 ENCOUNTER — Other Ambulatory Visit (INDEPENDENT_AMBULATORY_CARE_PROVIDER_SITE_OTHER): Payer: Medicare Other

## 2015-05-02 DIAGNOSIS — E785 Hyperlipidemia, unspecified: Secondary | ICD-10-CM

## 2015-05-02 LAB — LIPID PANEL
Cholesterol: 247 mg/dL — ABNORMAL HIGH (ref 0–200)
HDL: 38.1 mg/dL — ABNORMAL LOW (ref >39.00)
NONHDL: 208.9
TRIGLYCERIDES: 275 mg/dL — AB (ref 0.0–149.0)
Total CHOL/HDL Ratio: 6
VLDL: 55 mg/dL — AB (ref 0.0–40.0)

## 2015-05-02 LAB — HEPATIC FUNCTION PANEL
ALK PHOS: 35 U/L — AB (ref 39–117)
ALT: 27 U/L (ref 0–35)
AST: 19 U/L (ref 0–37)
Albumin: 4.2 g/dL (ref 3.5–5.2)
Bilirubin, Direct: 0 mg/dL (ref 0.0–0.3)
TOTAL PROTEIN: 6.9 g/dL (ref 6.0–8.3)
Total Bilirubin: 0.3 mg/dL (ref 0.2–1.2)

## 2015-05-02 LAB — LDL CHOLESTEROL, DIRECT: Direct LDL: 162 mg/dL

## 2015-05-13 MED ORDER — ATORVASTATIN CALCIUM 20 MG PO TABS: 20.0000 mg | ORAL_TABLET | Freq: Every day | ORAL | Status: DC

## 2015-06-12 ENCOUNTER — Other Ambulatory Visit: Payer: Self-pay

## 2015-06-12 ENCOUNTER — Other Ambulatory Visit: Payer: Self-pay | Admitting: Family Medicine

## 2015-06-12 MED ORDER — ATORVASTATIN CALCIUM 20 MG PO TABS: 20.0000 mg | ORAL_TABLET | Freq: Every day | ORAL | Status: DC

## 2015-07-25 ENCOUNTER — Encounter: Payer: Self-pay | Admitting: Family Medicine

## 2015-07-25 ENCOUNTER — Ambulatory Visit (INDEPENDENT_AMBULATORY_CARE_PROVIDER_SITE_OTHER): Payer: 59 | Admitting: Family Medicine

## 2015-07-25 VITALS — BP 138/72 | HR 65 | Temp 98.3°F | Wt 207.8 lb

## 2015-07-25 DIAGNOSIS — I10 Essential (primary) hypertension: Secondary | ICD-10-CM

## 2015-07-25 DIAGNOSIS — Z23 Encounter for immunization: Secondary | ICD-10-CM

## 2015-07-25 DIAGNOSIS — E785 Hyperlipidemia, unspecified: Secondary | ICD-10-CM | POA: Diagnosis not present

## 2015-07-25 LAB — COMPREHENSIVE METABOLIC PANEL
ALT: 36 U/L — ABNORMAL HIGH (ref 0–35)
AST: 28 U/L (ref 0–37)
Albumin: 4.4 g/dL (ref 3.5–5.2)
Alkaline Phosphatase: 32 U/L — ABNORMAL LOW (ref 39–117)
BUN: 24 mg/dL — AB (ref 6–23)
CHLORIDE: 102 meq/L (ref 96–112)
CO2: 30 meq/L (ref 19–32)
CREATININE: 0.99 mg/dL (ref 0.40–1.20)
Calcium: 10.1 mg/dL (ref 8.4–10.5)
GFR: 60.65 mL/min (ref >60.00)
GLUCOSE: 108 mg/dL — AB (ref 70–99)
POTASSIUM: 3.5 meq/L (ref 3.5–5.1)
Sodium: 141 mEq/L (ref 135–145)
Total Bilirubin: 0.4 mg/dL (ref 0.2–1.2)
Total Protein: 7.2 g/dL (ref 6.0–8.3)

## 2015-07-25 LAB — LIPID PANEL
CHOL/HDL RATIO: 5
CHOLESTEROL: 168 mg/dL (ref 0–200)
HDL: 36.5 mg/dL — ABNORMAL LOW (ref >39.00)
LDL CALC: 94 mg/dL (ref 0–99)
NonHDL: 131.77
Triglycerides: 189 mg/dL — ABNORMAL HIGH (ref 0.0–149.0)
VLDL: 37.8 mg/dL (ref 0.0–40.0)

## 2015-07-25 NOTE — Progress Notes (Signed)
Pre visit review using our clinic review tool, if applicable. No additional management support is needed unless otherwise documented below in the visit note. 

## 2015-07-25 NOTE — Assessment & Plan Note (Signed)
con't hctz and lisinopril  Stable rto 6 months

## 2015-07-25 NOTE — Patient Instructions (Signed)

## 2015-07-25 NOTE — Progress Notes (Signed)
Patient ID: Kristin Delgado, female    DOB: 12-25-1955  Age: 59 y.o. MRN: [id number]    Subjective:  Subjective HPI Kristin Delgado presents for cholesterol and bp.  No cp, sob and palpitations.    Review of Systems  Constitutional: Negative for diaphoresis, appetite change, fatigue and unexpected weight change.  Eyes: Negative for pain, redness and visual disturbance.  Respiratory: Negative for cough, chest tightness, shortness of breath and wheezing.   Cardiovascular: Negative for chest pain, palpitations and leg swelling.  Endocrine: Negative for cold intolerance, heat intolerance, polydipsia, polyphagia and polyuria.  Genitourinary: Negative for dysuria, frequency and difficulty urinating.  Neurological: Negative for dizziness, light-headedness, numbness and headaches.  Psychiatric/Behavioral: Negative for decreased concentration. The patient is not nervous/anxious.     History Past Medical History  Diagnosis Date  . Hypertension   . Chest pain 2006    admitted, essentially negative  . Pituitary microadenoma   . Hyperlipidemia   . Allergy     seasonal  . Anxiety     closed space like MRI  . 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   Her family history includes Alzheimer's disease in her father; Cancer (age of onset: 41) in her mother; Dementia in her father; Lung cancer in her mother.She reports that she has never smoked. She has never used smokeless tobacco. She reports that she does not drink alcohol or use illicit drugs.  Current Outpatient Prescriptions on File Prior to Visit  Medication Sig Dispense Refill  . acetaminophen (TYLENOL ARTHRITIS PAIN) 650 MG CR tablet Take 650 mg by mouth every 8 (eight) hours as needed. pain     . Ascorbic Acid (VITAMIN C) 1000 MG tablet Take 1,000 mg by mouth 2 (two) times daily.      [name] atorvastatin (LIPITOR)  20 MG tablet Take 1 tablet (20 mg total) by mouth daily. 90 tablet 1  . bromocriptine (PARLODEL) 2.5 MG tablet TAKE 1 TABLET DAILY 90 tablet 1  . famotidine (PEPCID AC) 10 MG chewable tablet Chew 10 mg by mouth daily.      . fenofibrate 160 MG tablet Take 1 tablet (160 mg total) by mouth daily. 90 tablet 1  . hydrochlorothiazide (HYDRODIURIL) 25 MG tablet TAKE 1 TABLET DAILY 90 tablet 1  . lisinopril (PRINIVIL,ZESTRIL) 10 MG tablet TAKE ONE-HALF (1/2) TABLET DAILY 45 tablet 1  . Multiple Vitamin (MULTIVITAMIN) tablet Take 1 tablet by mouth daily.      . Omega-3 Fatty Acids (FISH OIL) 1200 MG CAPS Take 1 capsule by mouth 2 (two) times daily.    . potassium chloride (K-DUR,KLOR-CON) 10 MEQ tablet TAKE 1 TABLET DAILY, LABS DUE NOW 90 tablet 0   No current facility-administered medications on file prior to visit.     Objective:  Objective Physical Exam  Constitutional: She is oriented to person, place, and time. She appears well-developed and well-nourished.  HENT:  Head: Normocephalic and atraumatic.  Eyes: Conjunctivae and EOM are normal.  Neck: Normal range of motion. Neck supple. No JVD present. Carotid bruit is not present. No thyromegaly present.  Cardiovascular: Normal rate and regular rhythm.   Murmur heard. Pulmonary/Chest: Effort normal and breath sounds normal. No respiratory distress. She has no wheezes. She has no rales. She exhibits no tenderness.  Musculoskeletal: She exhibits no edema.  Neurological: She is alert and oriented to person, place, and time.  Psychiatric: She  has a normal mood and affect. Her behavior is normal.   BP 138/72 mmHg  Pulse 65  Temp(Src) 98.3 F (36.8 C) (Oral)  Wt 207 lb 12.8 oz (94.257 kg)  SpO2 98% Wt Readings from Last 3 Encounters:  07/25/15 207 lb 12.8 oz (94.257 kg)  01/22/15 208 lb (94.348 kg)  07/16/14 195 lb 5.2 oz (88.6 kg)     Lab Results  Component Value Date   WBC 6.7 07/16/2014   HGB 12.3 07/16/2014   HCT 37.5 07/16/2014     PLT 198.0 07/16/2014   GLUCOSE 108* 07/25/2015   CHOL 168 07/25/2015   TRIG 189.0* 07/25/2015   HDL 36.50* 07/25/2015   LDLDIRECT 162.0 05/02/2015   LDLCALC 94 07/25/2015   ALT 36* 07/25/2015   AST 28 07/25/2015   NA 141 07/25/2015   K 3.5 07/25/2015   CL 102 07/25/2015   CREATININE 0.99 07/25/2015   BUN 24* 07/25/2015   CO2 30 07/25/2015   TSH 2.86 07/16/2014    Mm Digital Screening Bilateral  08/24/2014   CLINICAL DATA:  Screening.  EXAM: DIGITAL SCREENING BILATERAL MAMMOGRAM WITH CAD  COMPARISON:  Previous exam(s).  ACR Breast Density Category b: There are scattered areas of fibroglandular density.  FINDINGS: There are no findings suspicious for malignancy. Images were processed with CAD.  IMPRESSION: No mammographic evidence of malignancy. A result letter of this screening mammogram will be mailed directly to the patient.  RECOMMENDATION: Screening mammogram in one year. (Code:[id number]-[id number])  BI-RADS CATEGORY  1: Negative.   Electronically Signed   By: Curlene Dolphin M.D.   On: 08/24/2014 11:22     Assessment & Plan:  Plan I am having Ms. Guerrieri maintain her famotidine, multivitamin, vitamin C, acetaminophen, potassium chloride, lisinopril, hydrochlorothiazide, fenofibrate, Fish Oil, atorvastatin, and bromocriptine.  No orders of the defined types were placed in this encounter.    Problem List Items Addressed This Visit      Unprioritized   Hyperlipidemia LDL goal <100    con't statin Check labs      Essential hypertension - Primary    con't hctz and lisinopril  Stable rto 6 months      Relevant Orders   Comp Met (CMET) (Completed)    Other Visit Diagnoses    Hyperlipidemia        Relevant Orders    Comp Met (CMET) (Completed)    Lipid panel (Completed)    Need for shingles vaccine        Relevant Orders    Varicella-zoster vaccine subcutaneous (Completed)       Follow-up: Return in about 6 months (around 01/22/2016), or if symptoms worsen or fail to improve, for  annual exam, fasting.  Garnet Koyanagi, DO

## 2015-07-25 NOTE — Assessment & Plan Note (Signed)
con't statin Check labs 

## 2015-08-14 ENCOUNTER — Other Ambulatory Visit: Payer: Self-pay | Admitting: Internal Medicine

## 2015-08-14 NOTE — Telephone Encounter (Signed)
Last OV 07/25/15 Last refill 01/25/15  Please advise on refill

## 2015-10-01 ENCOUNTER — Other Ambulatory Visit: Payer: Self-pay | Admitting: Family Medicine

## 2015-10-01 ENCOUNTER — Other Ambulatory Visit: Payer: Self-pay | Admitting: Internal Medicine

## 2015-10-30 ENCOUNTER — Other Ambulatory Visit: Payer: Self-pay | Admitting: Family Medicine

## 2016-01-06 ENCOUNTER — Other Ambulatory Visit: Payer: Self-pay | Admitting: Family Medicine

## 2016-01-24 ENCOUNTER — Encounter: Payer: Medicare Other | Admitting: Family Medicine

## 2016-01-24 ENCOUNTER — Telehealth: Payer: Self-pay | Admitting: *Deleted

## 2016-01-24 NOTE — Telephone Encounter (Signed)
Unable to reach patient at time of pre-visit call. Phone rang several times and then went to busy signal, no voicemail.

## 2016-01-27 ENCOUNTER — Ambulatory Visit (INDEPENDENT_AMBULATORY_CARE_PROVIDER_SITE_OTHER): Payer: Medicare Other | Admitting: Family Medicine

## 2016-01-27 ENCOUNTER — Other Ambulatory Visit (HOSPITAL_COMMUNITY)
Admission: RE | Admit: 2016-01-27 | Discharge: 2016-01-27 | Disposition: A | Discharge status: Home / Self Care | Payer: Medicare Other | Source: Ambulatory Visit | Attending: Family Medicine | Admitting: Family Medicine

## 2016-01-27 ENCOUNTER — Encounter: Payer: Self-pay | Admitting: Family Medicine

## 2016-01-27 VITALS — BP 120/85 | HR 61 | Temp 98.3°F | Ht 64.5 in | Wt 208.0 lb

## 2016-01-27 DIAGNOSIS — Z1231 Encounter for screening mammogram for malignant neoplasm of breast: Secondary | ICD-10-CM

## 2016-01-27 DIAGNOSIS — E785 Hyperlipidemia, unspecified: Secondary | ICD-10-CM

## 2016-01-27 DIAGNOSIS — Z124 Encounter for screening for malignant neoplasm of cervix: Secondary | ICD-10-CM

## 2016-01-27 DIAGNOSIS — Z0000 Encounter for general adult medical examination without abnormal findings: Secondary | ICD-10-CM | POA: Diagnosis not present

## 2016-01-27 DIAGNOSIS — Z1151 Encounter for screening for human papillomavirus (HPV): Secondary | ICD-10-CM | POA: Insufficient documentation

## 2016-01-27 DIAGNOSIS — Z01419 Encounter for gynecological examination (general) (routine) without abnormal findings: Secondary | ICD-10-CM | POA: Diagnosis not present

## 2016-01-27 DIAGNOSIS — Z1159 Encounter for screening for other viral diseases: Secondary | ICD-10-CM | POA: Diagnosis not present

## 2016-01-27 DIAGNOSIS — Z1239 Encounter for other screening for malignant neoplasm of breast: Secondary | ICD-10-CM

## 2016-01-27 DIAGNOSIS — D352 Benign neoplasm of pituitary gland: Secondary | ICD-10-CM

## 2016-01-27 DIAGNOSIS — I10 Essential (primary) hypertension: Secondary | ICD-10-CM

## 2016-01-27 DIAGNOSIS — Z114 Encounter for screening for human immunodeficiency virus [HIV]: Secondary | ICD-10-CM

## 2016-01-27 LAB — POC URINALSYSI DIPSTICK (AUTOMATED)
Bilirubin, UA: NEGATIVE
Blood, UA: NEGATIVE
Glucose, UA: NEGATIVE
KETONES UA: NEGATIVE
Leukocytes, UA: NEGATIVE
Nitrite, UA: NEGATIVE
PH UA: 5.5
PROTEIN UA: NEGATIVE
Spec Grav, UA: >=1.03
Urobilinogen, UA: 0.2

## 2016-01-27 LAB — COMPREHENSIVE METABOLIC PANEL
ALBUMIN: 4.9 g/dL (ref 3.5–5.2)
ALK PHOS: 32 U/L — AB (ref 39–117)
ALT: 34 U/L (ref 0–35)
AST: 24 U/L (ref 0–37)
BILIRUBIN TOTAL: 0.3 mg/dL (ref 0.2–1.2)
BUN: 24 mg/dL — AB (ref 6–23)
CO2: 29 mEq/L (ref 19–32)
CREATININE: 0.97 mg/dL (ref 0.40–1.20)
Calcium: 10.1 mg/dL (ref 8.4–10.5)
Chloride: 102 mEq/L (ref 96–112)
GFR: 61.99 mL/min (ref >60.00)
Glucose, Bld: 122 mg/dL — ABNORMAL HIGH (ref 70–99)
Potassium: 3.2 mEq/L — ABNORMAL LOW (ref 3.5–5.1)
SODIUM: 141 meq/L (ref 135–145)
TOTAL PROTEIN: 8 g/dL (ref 6.0–8.3)

## 2016-01-27 LAB — CBC WITH DIFFERENTIAL/PLATELET
Basophils Absolute: 0 10*3/uL (ref 0.0–0.1)
Basophils Relative: 0.4 % (ref 0.0–3.0)
EOS PCT: 0.8 % (ref 0.0–5.0)
Eosinophils Absolute: 0.1 10*3/uL (ref 0.0–0.7)
HCT: 39.1 % (ref 36.0–46.0)
Hemoglobin: 12.9 g/dL (ref 12.0–15.0)
LYMPHS ABS: 2.2 10*3/uL (ref 0.7–4.0)
Lymphocytes Relative: 24 % (ref 12.0–46.0)
MCHC: 33 g/dL (ref 30.0–36.0)
MCV: 79.9 fl (ref 78.0–100.0)
MONO ABS: 0.4 10*3/uL (ref 0.1–1.0)
Monocytes Relative: 4.9 % (ref 3.0–12.0)
Neutro Abs: 6.3 10*3/uL (ref 1.4–7.7)
Neutrophils Relative %: 69.9 % (ref 43.0–77.0)
Platelets: 265 10*3/uL (ref 150.0–400.0)
RBC: 4.9 Mil/uL (ref 3.87–5.11)
RDW: 14.9 % (ref 11.5–15.5)
WBC: 9 10*3/uL (ref 4.0–10.5)

## 2016-01-27 LAB — LIPID PANEL
CHOLESTEROL: 183 mg/dL (ref 0–200)
HDL: 43.6 mg/dL (ref >39.00)
LDL Cholesterol: 107 mg/dL — ABNORMAL HIGH (ref 0–99)
NonHDL: 139.86
Total CHOL/HDL Ratio: 4
Triglycerides: 162 mg/dL — ABNORMAL HIGH (ref 0.0–149.0)
VLDL: 32.4 mg/dL (ref 0.0–40.0)

## 2016-01-27 MED ORDER — BROMOCRIPTINE MESYLATE 2.5 MG PO TABS: 2.5000 mg | ORAL_TABLET | Freq: Every day | ORAL | Status: DC

## 2016-01-27 NOTE — Progress Notes (Signed)
Subjective:   Kristin Delgado is a 60 y.o. female who presents for Medicare Annual (Subsequent) preventive examination.  Review of Systems:   Review of Systems  Constitutional: Negative for activity change, appetite change and fatigue.  HENT: Negative for hearing loss, congestion, tinnitus and ear discharge.   Eyes: Negative for visual disturbance (see optho q1y -- + glasses  Respiratory: Negative for cough, chest tightness and shortness of breath.   Cardiovascular: Negative for chest pain, palpitations and leg swelling.  Gastrointestinal: Negative for abdominal pain, diarrhea, constipation and abdominal distention.  Genitourinary: Negative for urgency, frequency, decreased urine volume and difficulty urinating.  Musculoskeletal: Negative for back pain, arthralgias and gait problem.  Skin: Negative for color change, pallor and rash.  Neurological: Negative for dizziness, light-headedness, numbness and headaches.  Hematological: Negative for adenopathy. Does not bruise/bleed easily.  Psychiatric/Behavioral: Negative for suicidal ideas, confusion, sleep disturbance, self-injury, dysphoric mood, decreased concentration and agitation.  Pt is able to read and write and can do all ADLs No risk for falling No abuse/ violence in home           Objective:     Vitals: BP 120/85 mmHg  Pulse 61  Temp(Src) 98.3 F (36.8 C) (Oral)  Ht 5' 4.5" (1.638 m)  Wt 208 lb (94.348 kg)  BMI 35.16 kg/m2  SpO2 97% BP 120/85 mmHg  Pulse 61  Temp(Src) 98.3 F (36.8 C) (Oral)  Ht 5' 4.5" (1.638 m)  Wt 208 lb (94.348 kg)  BMI 35.16 kg/m2  SpO2 97% General appearance: alert, cooperative, appears stated age and no distress Head: Normocephalic, without obvious abnormality, atraumatic Eyes: conjunctivae/corneas clear. PERRL, EOM's intact. Fundi benign. Ears: normal TM's and external ear canals both ears Nose: Nares normal. Septum midline. Mucosa normal. No drainage or sinus tenderness. Throat:  lips, mucosa, and tongue normal; teeth and gums normal Neck: no adenopathy, no carotid bruit, no JVD, supple, symmetrical, trachea midline and thyroid not enlarged, symmetric, no tenderness/mass/nodules Back: symmetric, no curvature. ROM normal. No CVA tenderness. Lungs: clear to auscultation bilaterally Breasts: normal appearance, no masses or tenderness Heart: regular rate and rhythm, S1, S2 normal, no murmur, click, rub or gallop Abdomen: soft, non-tender; bowel sounds normal; no masses,  no organomegaly Pelvic: cervix normal in appearance, external genitalia normal, no adnexal masses or tenderness, no cervical motion tenderness, rectovaginal septum normal, uterus normal size, shape, and consistency, vagina normal without discharge and pap done Extremities: no edema, redness or tenderness in the calves or thighs, no ulcers, gangrene or trophic changes and venous stasis dermatitis noted Pulses: 2+ and symmetric Skin: Skin color, texture, turgor normal. No rashes or lesions Lymph nodes: Cervical, supraclavicular, and axillary nodes normal. Neurologic: Alert and oriented X 3, normal strength and tone. Normal symmetric reflexes. Normal coordination and gait Psych- no depression, no anxiety Tobacco History  Smoking status  . Never Smoker   Smokeless tobacco  . Never Used     Counseling given: Not Answered   Past Medical History  Diagnosis Date  . Hypertension   . Chest pain 2006    admitted, essentially negative  . Pituitary microadenoma ([place])   . Hyperlipidemia   . Allergy     seasonal  . Anxiety     closed space like MRI  . GERD (gastroesophageal reflux disease)    Past Surgical History  Procedure Laterality Date  . 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 Brain surgery  1 / 1985    microadenoma  .  Cochlear implant  02/2013    Left side   Family History  Problem Relation Age of Onset  . Lung cancer Mother   . Cancer Mother 33    lung   . Alzheimer's disease Father   .  Dementia Father    History  Sexual Activity  . Sexual Activity:  . Partners: Male    Outpatient Encounter Prescriptions as of 01/27/2016  Medication Sig  . acetaminophen (TYLENOL ARTHRITIS PAIN) 650 MG CR tablet Take 650 mg by mouth every 8 (eight) hours as needed. pain   . Ascorbic Acid (VITAMIN C) 1000 MG tablet Take 1,000 mg by mouth 2 (two) times daily.    [name] atorvastatin (LIPITOR) 20 MG tablet TAKE 1 TABLET DAILY  . bromocriptine (PARLODEL) 2.5 MG tablet Take 1 tablet (2.5 mg total) by mouth daily.  . famotidine (PEPCID AC) 10 MG chewable tablet Chew 10 mg by mouth daily.    . fenofibrate 160 MG tablet TAKE 1 TABLET DAILY  . hydrochlorothiazide (HYDRODIURIL) 25 MG tablet TAKE 1 TABLET DAILY  . lisinopril (PRINIVIL,ZESTRIL) 10 MG tablet TAKE ONE-HALF (1/2) TABLET DAILY  . Multiple Vitamin (MULTIVITAMIN) tablet Take 1 tablet by mouth daily.    . Omega-3 Fatty Acids (FISH OIL) 1200 MG CAPS Take 1 capsule by mouth 2 (two) times daily.  . [DISCONTINUED] bromocriptine (PARLODEL) 2.5 MG tablet TAKE 1 TABLET DAILY  . [DISCONTINUED] bromocriptine (PARLODEL) 2.5 MG tablet Take 1 tablet (2.5 mg total) by mouth daily.  . [DISCONTINUED] potassium chloride (K-DUR,KLOR-CON) 10 MEQ tablet TAKE 1 TABLET DAILY, LABS DUE NOW  . Potassium Gluconate ([employer] POTASSIUM GLUCONATE) 2 MEQ TABS Take 1 tablet by mouth daily.   No facility-administered encounter medications on file as of 01/27/2016.    Activities of Daily Living In your present state of health, do you have any difficulty performing the following activities: 01/27/2016  Hearing? Y  Vision? N  Difficulty concentrating or making decisions? N  Walking or climbing stairs? N  Dressing or bathing? N  Doing errands, shopping? N    Patient Care Team: Rosalita Chessman, DO as PCP - General    Assessment:    CPE Exercise Activities and Dietary recommendations-- walking when weather gets better     Goals    None     Fall Risk No flowsheet data  found. Depression Screen PHQ 2/9 Scores 07/14/2013  PHQ - 2 Score 0     Cognitive Testing MMSE 30/30  Immunization History  Administered Date(s) Administered  . Tdap 06/10/2011  . Zoster 07/25/2015   Screening Tests Health Maintenance  Topic Date Due  . Hepatitis C Screening  [birth year]  . HIV Screening  10/[birth year]/1969  . PAP SMEAR  06/09/2014  . INFLUENZA VACCINE  07/24/2016 (Originally 06/17/2015)  . MAMMOGRAM  08/22/2016  . TETANUS/TDAP  06/09/2021  . COLONOSCOPY  08/11/2021  . ZOSTAVAX  Completed      Plan:    see AVS During the course of the visit the patient was educated and counseled about the following appropriate screening and preventive services:   Vaccines to include Pneumoccal, Influenza, Hepatitis B, Td, Zostavax, HCV  Electrocardiogram  Cardiovascular Disease  Colorectal cancer screening  Bone density screening  Diabetes screening  Glaucoma screening  Mammography/PAP  Nutrition counseling   Patient Instructions (the written plan) was given to the patient.  1. Pituitary microadenoma ([facility]) Check labs - bromocriptine (PARLODEL) 2.5 MG tablet; Take 1 tablet (2.5 mg total) by mouth daily.  Dispense: 90 tablet; Refill:  3  2. Hyperlipidemia con't lipitor - Lipid panel - Comp Met (CMET) - POCT Urinalysis Dipstick (Automated) - CBC w/Diff  3. Essential hypertension con't lisinopril  - Comp Met (CMET) - POCT Urinalysis Dipstick (Automated) - CBC w/Diff  4. Screening for HIV (human immunodeficiency virus)   5. Need for hepatitis C screening test  - Hepatitis C antibody  Garnet Koyanagi, DO  01/27/2016

## 2016-01-27 NOTE — Progress Notes (Signed)
Pre visit review using our clinic review tool, if applicable. No additional management support is needed unless otherwise documented below in the visit note. 

## 2016-01-27 NOTE — Patient Instructions (Signed)
Preventive Care for Adults, Female A healthy lifestyle and preventive care can promote health and wellness. Preventive health guidelines for women include the following key practices.  A routine yearly physical is a good way to check with your health care provider about your health and preventive screening. It is a chance to share any concerns and updates on your health and to receive a thorough exam.  Visit your dentist for a routine exam and preventive care every 6 months. Brush your teeth twice a day and floss once a day. Good oral hygiene prevents tooth decay and gum disease.  The frequency of eye exams is based on your age, health, family medical history, use of contact lenses, and other factors. Follow your health care provider's recommendations for frequency of eye exams.  Eat a healthy diet. Foods like vegetables, fruits, whole grains, low-fat dairy products, and lean protein foods contain the nutrients you need without too many calories. Decrease your intake of foods high in solid fats, added sugars, and salt. Eat the right amount of calories for you.Get information about a proper diet from your health care provider, if necessary.  Regular physical exercise is one of the most important things you can do for your health. Most adults should get at least 150 minutes of moderate-intensity exercise (any activity that increases your heart rate and causes you to sweat) each week. In addition, most adults need muscle-strengthening exercises on 2 or more days a week.  Maintain a healthy weight. The body mass index (BMI) is a screening tool to identify possible weight problems. It provides an estimate of body fat based on height and weight. Your health care provider can find your BMI and can help you achieve or maintain a healthy weight.For adults 20 years and older:  A BMI below 18.5 is considered underweight.  A BMI of 18.5 to 24.9 is normal.  A BMI of 25 to 29.9 is considered overweight.  A  BMI of 30 and above is considered obese.  Maintain normal blood lipids and cholesterol levels by exercising and minimizing your intake of saturated fat. Eat a balanced diet with plenty of fruit and vegetables. Blood tests for lipids and cholesterol should begin at age 45 and be repeated every 5 years. If your lipid or cholesterol levels are high, you are over 50, or you are at high risk for heart disease, you may need your cholesterol levels checked more frequently.Ongoing high lipid and cholesterol levels should be treated with medicines if diet and exercise are not working.  If you smoke, find out from your health care provider how to quit. If you do not use tobacco, do not start.  Lung cancer screening is recommended for adults aged 45-80 years who are at high risk for developing lung cancer because of a history of smoking. A yearly low-dose CT scan of the lungs is recommended for people who have at least a 30-pack-year history of smoking and are a current smoker or have quit within the past 15 years. A pack year of smoking is smoking an average of 1 pack of cigarettes a day for 1 year (for example: 1 pack a day for 30 years or 2 packs a day for 15 years). Yearly screening should continue until the smoker has stopped smoking for at least 15 years. Yearly screening should be stopped for people who develop a health problem that would prevent them from having lung cancer treatment.  If you are pregnant, do not drink alcohol. If you are  breastfeeding, be very cautious about drinking alcohol. If you are not pregnant and choose to drink alcohol, do not have more than 1 drink per day. One drink is considered to be 12 ounces (355 mL) of beer, 5 ounces (148 mL) of wine, or 1.5 ounces (44 mL) of liquor.  Avoid use of street drugs. Do not share needles with anyone. Ask for help if you need support or instructions about stopping the use of drugs.  High blood pressure causes heart disease and increases the risk  of stroke. Your blood pressure should be checked at least every 1 to 2 years. Ongoing high blood pressure should be treated with medicines if weight loss and exercise do not work.  If you are 55-79 years old, ask your health care provider if you should take aspirin to prevent strokes.  Diabetes screening is done by taking a blood sample to check your blood glucose level after you have not eaten for a certain period of time (fasting). If you are not overweight and you do not have risk factors for diabetes, you should be screened once every 3 years starting at age 45. If you are overweight or obese and you are 40-70 years of age, you should be screened for diabetes every year as part of your cardiovascular risk assessment.  Breast cancer screening is essential preventive care for women. You should practice "breast self-awareness." This means understanding the normal appearance and feel of your breasts and may include breast self-examination. Any changes detected, no matter how small, should be reported to a health care provider. Women in their 20s and 30s should have a clinical breast exam (CBE) by a health care provider as part of a regular health exam every 1 to 3 years. After age 40, women should have a CBE every year. Starting at age 40, women should consider having a mammogram (breast X-ray test) every year. Women who have a family history of breast cancer should talk to their health care provider about genetic screening. Women at a high risk of breast cancer should talk to their health care providers about having an MRI and a mammogram every year.  Breast cancer gene (BRCA)-related cancer risk assessment is recommended for women who have family members with BRCA-related cancers. BRCA-related cancers include breast, ovarian, tubal, and peritoneal cancers. Having family members with these cancers may be associated with an increased risk for harmful changes (mutations) in the breast cancer genes BRCA1 and  BRCA2. Results of the assessment will determine the need for genetic counseling and BRCA1 and BRCA2 testing.  Your health care provider may recommend that you be screened regularly for cancer of the pelvic organs (ovaries, uterus, and vagina). This screening involves a pelvic examination, including checking for microscopic changes to the surface of your cervix (Pap test). You may be encouraged to have this screening done every 3 years, beginning at age 21.  For women ages 30-65, health care providers may recommend pelvic exams and Pap testing every 3 years, or they may recommend the Pap and pelvic exam, combined with testing for human papilloma virus (HPV), every 5 years. Some types of HPV increase your risk of cervical cancer. Testing for HPV may also be done on women of any age with unclear Pap test results.  Other health care providers may not recommend any screening for nonpregnant women who are considered low risk for pelvic cancer and who do not have symptoms. Ask your health care provider if a screening pelvic exam is right for   you.  If you have had past treatment for cervical cancer or a condition that could lead to cancer, you need Pap tests and screening for cancer for at least 20 years after your treatment. If Pap tests have been discontinued, your risk factors (such as having a new sexual partner) need to be reassessed to determine if screening should resume. Some women have medical problems that increase the chance of getting cervical cancer. In these cases, your health care provider may recommend more frequent screening and Pap tests.  Colorectal cancer can be detected and often prevented. Most routine colorectal cancer screening begins at the age of 50 years and continues through age 75 years. However, your health care provider may recommend screening at an earlier age if you have risk factors for colon cancer. On a yearly basis, your health care provider may provide home test kits to check  for hidden blood in the stool. Use of a small camera at the end of a tube, to directly examine the colon (sigmoidoscopy or colonoscopy), can detect the earliest forms of colorectal cancer. Talk to your health care provider about this at age 50, when routine screening begins. Direct exam of the colon should be repeated every 5-10 years through age 75 years, unless early forms of precancerous polyps or small growths are found.  People who are at an increased risk for hepatitis B should be screened for this virus. You are considered at high risk for hepatitis B if:  You were born in a country where hepatitis B occurs often. Talk with your health care provider about which countries are considered high risk.  Your parents were born in a high-risk country and you have not received a shot to protect against hepatitis B (hepatitis B vaccine).  You have HIV or AIDS.  You use needles to inject street drugs.  You live with, or have sex with, someone who has hepatitis B.  You get hemodialysis treatment.  You take certain medicines for conditions like cancer, organ transplantation, and autoimmune conditions.  Hepatitis C blood testing is recommended for all people born from 1945 through 1965 and any individual with known risks for hepatitis C.  Practice safe sex. Use condoms and avoid high-risk sexual practices to reduce the spread of sexually transmitted infections (STIs). STIs include gonorrhea, chlamydia, syphilis, trichomonas, herpes, HPV, and human immunodeficiency virus (HIV). Herpes, HIV, and HPV are viral illnesses that have no cure. They can result in disability, cancer, and death.  You should be screened for sexually transmitted illnesses (STIs) including gonorrhea and chlamydia if:  You are sexually active and are younger than 24 years.  You are older than 24 years and your health care provider tells you that you are at risk for this type of infection.  Your sexual activity has changed  since you were last screened and you are at an increased risk for chlamydia or gonorrhea. Ask your health care provider if you are at risk.  If you are at risk of being infected with HIV, it is recommended that you take a prescription medicine daily to prevent HIV infection. This is called preexposure prophylaxis (PrEP). You are considered at risk if:  You are sexually active and do not regularly use condoms or know the HIV status of your partner(s).  You take drugs by injection.  You are sexually active with a partner who has HIV.  Talk with your health care provider about whether you are at high risk of being infected with HIV. If   you choose to begin PrEP, you should first be tested for HIV. You should then be tested every 3 months for as long as you are taking PrEP.  Osteoporosis is a disease in which the bones lose minerals and strength with aging. This can result in serious bone fractures or breaks. The risk of osteoporosis can be identified using a bone density scan. Women ages 67 years and over and women at risk for fractures or osteoporosis should discuss screening with their health care providers. Ask your health care provider whether you should take a calcium supplement or vitamin D to reduce the rate of osteoporosis.  Menopause can be associated with physical symptoms and risks. Hormone replacement therapy is available to decrease symptoms and risks. You should talk to your health care provider about whether hormone replacement therapy is right for you.  Use sunscreen. Apply sunscreen liberally and repeatedly throughout the day. You should seek shade when your shadow is shorter than you. Protect yourself by wearing long sleeves, pants, a wide-brimmed hat, and sunglasses year round, whenever you are outdoors.  Once a month, do a whole body skin exam, using a mirror to look at the skin on your back. Tell your health care provider of new moles, moles that have irregular borders, moles that  are larger than a pencil eraser, or moles that have changed in shape or color.  Stay current with required vaccines (immunizations).  Influenza vaccine. All adults should be immunized every year.  Tetanus, diphtheria, and acellular pertussis (Td, Tdap) vaccine. Pregnant women should receive 1 dose of Tdap vaccine during each pregnancy. The dose should be obtained regardless of the length of time since the last dose. Immunization is preferred during the 27th-36th week of gestation. An adult who has not previously received Tdap or who does not know her vaccine status should receive 1 dose of Tdap. This initial dose should be followed by tetanus and diphtheria toxoids (Td) booster doses every 10 years. Adults with an unknown or incomplete history of completing a 3-dose immunization series with Td-containing vaccines should begin or complete a primary immunization series including a Tdap dose. Adults should receive a Td booster every 10 years.  Varicella vaccine. An adult without evidence of immunity to varicella should receive 2 doses or a second dose if she has previously received 1 dose. Pregnant females who do not have evidence of immunity should receive the first dose after pregnancy. This first dose should be obtained before leaving the health care facility. The second dose should be obtained 4-8 weeks after the first dose.  Human papillomavirus (HPV) vaccine. Females aged 13-26 years who have not received the vaccine previously should obtain the 3-dose series. The vaccine is not recommended for use in pregnant females. However, pregnancy testing is not needed before receiving a dose. If a female is found to be pregnant after receiving a dose, no treatment is needed. In that case, the remaining doses should be delayed until after the pregnancy. Immunization is recommended for any person with an immunocompromised condition through the age of 61 years if she did not get any or all doses earlier. During the  3-dose series, the second dose should be obtained 4-8 weeks after the first dose. The third dose should be obtained 24 weeks after the first dose and 16 weeks after the second dose.  Zoster vaccine. One dose is recommended for adults aged 30 years or older unless certain conditions are present.  Measles, mumps, and rubella (MMR) vaccine. Adults born  before 1957 generally are considered immune to measles and mumps. Adults born in 1957 or later should have 1 or more doses of MMR vaccine unless there is a contraindication to the vaccine or there is laboratory evidence of immunity to each of the three diseases. A routine second dose of MMR vaccine should be obtained at least 28 days after the first dose for students attending postsecondary schools, health care workers, or international travelers. People who received inactivated measles vaccine or an unknown type of measles vaccine during 1963-1967 should receive 2 doses of MMR vaccine. People who received inactivated mumps vaccine or an unknown type of mumps vaccine before 1979 and are at high risk for mumps infection should consider immunization with 2 doses of MMR vaccine. For females of childbearing age, rubella immunity should be determined. If there is no evidence of immunity, females who are not pregnant should be vaccinated. If there is no evidence of immunity, females who are pregnant should delay immunization until after pregnancy. Unvaccinated health care workers born before 1957 who lack laboratory evidence of measles, mumps, or rubella immunity or laboratory confirmation of disease should consider measles and mumps immunization with 2 doses of MMR vaccine or rubella immunization with 1 dose of MMR vaccine.  Pneumococcal 13-valent conjugate (PCV13) vaccine. When indicated, a person who is uncertain of his immunization history and has no record of immunization should receive the PCV13 vaccine. All adults 65 years of age and older should receive this  vaccine. An adult aged 19 years or older who has certain medical conditions and has not been previously immunized should receive 1 dose of PCV13 vaccine. This PCV13 should be followed with a dose of pneumococcal polysaccharide (PPSV23) vaccine. Adults who are at high risk for pneumococcal disease should obtain the PPSV23 vaccine at least 8 weeks after the dose of PCV13 vaccine. Adults older than 62 years of age who have normal immune system function should obtain the PPSV23 vaccine dose at least 1 year after the dose of PCV13 vaccine.  Pneumococcal polysaccharide (PPSV23) vaccine. When PCV13 is also indicated, PCV13 should be obtained first. All adults aged 65 years and older should be immunized. An adult younger than age 65 years who has certain medical conditions should be immunized. Any person who resides in a nursing home or long-term care facility should be immunized. An adult smoker should be immunized. People with an immunocompromised condition and certain other conditions should receive both PCV13 and PPSV23 vaccines. People with human immunodeficiency virus (HIV) infection should be immunized as soon as possible after diagnosis. Immunization during chemotherapy or radiation therapy should be avoided. Routine use of PPSV23 vaccine is not recommended for American Indians, [place] Natives, or people younger than 65 years unless there are medical conditions that require PPSV23 vaccine. When indicated, people who have unknown immunization and have no record of immunization should receive PPSV23 vaccine. One-time revaccination 5 years after the first dose of PPSV23 is recommended for people aged 19-64 years who have chronic kidney failure, nephrotic syndrome, asplenia, or immunocompromised conditions. People who received 1-2 doses of PPSV23 before age 65 years should receive another dose of PPSV23 vaccine at age 65 years or later if at least 5 years have passed since the previous dose. Doses of PPSV23 are not  needed for people immunized with PPSV23 at or after age 65 years.  Meningococcal vaccine. Adults with asplenia or persistent complement component deficiencies should receive 2 doses of quadrivalent meningococcal conjugate (MenACWY-D) vaccine. The doses should be obtained   at least 2 months apart. Microbiologists working with certain meningococcal bacteria, [place] recruits, people at risk during an outbreak, and people who travel to or live in countries with a high rate of meningitis should be immunized. A first-year college student up through age 34 years who is living in a residence hall should receive a dose if she did not receive a dose on or after her 16th birthday. Adults who have certain high-risk conditions should receive one or more doses of vaccine.  Hepatitis A vaccine. Adults who wish to be protected from this disease, have certain high-risk conditions, work with hepatitis A-infected animals, work in hepatitis A research labs, or travel to or work in countries with a high rate of hepatitis A should be immunized. Adults who were previously unvaccinated and who anticipate close contact with an international adoptee during the first 60 days after arrival in the [place] from a country with a high rate of hepatitis A should be immunized.  Hepatitis B vaccine. Adults who wish to be protected from this disease, have certain high-risk conditions, may be exposed to blood or other infectious body fluids, are household contacts or sex partners of hepatitis B positive people, are clients or workers in certain care facilities, or travel to or work in countries with a high rate of hepatitis B should be immunized.  Haemophilus influenzae type b (Hib) vaccine. A previously unvaccinated person with asplenia or sickle cell disease or having a scheduled splenectomy should receive 1 dose of Hib vaccine. Regardless of previous immunization, a recipient of a hematopoietic stem cell transplant should receive a  3-dose series 6-12 months after her successful transplant. Hib vaccine is not recommended for adults with HIV infection. Preventive Services / Frequency Ages 35 to 4 years  Blood pressure check.** / Every 3-5 years.  Lipid and cholesterol check.** / Every 5 years beginning at age 60.  Clinical breast exam.** / Every 3 years for women in their 71s and 10s.  BRCA-related cancer risk assessment.** / For women who have family members with a BRCA-related cancer (breast, ovarian, tubal, or peritoneal cancers).  Pap test.** / Every 2 years from ages 76 through 26. Every 3 years starting at age 61 through age 76 or 93 with a history of 3 consecutive normal Pap tests.  HPV screening.** / Every 3 years from ages 37 through ages 60 to 51 with a history of 3 consecutive normal Pap tests.  Hepatitis C blood test.** / For any individual with known risks for hepatitis C.  Skin self-exam. / Monthly.  Influenza vaccine. / Every year.  Tetanus, diphtheria, and acellular pertussis (Tdap, Td) vaccine.** / Consult your health care provider. Pregnant women should receive 1 dose of Tdap vaccine during each pregnancy. 1 dose of Td every 10 years.  Varicella vaccine.** / Consult your health care provider. Pregnant females who do not have evidence of immunity should receive the first dose after pregnancy.  HPV vaccine. / 3 doses over 6 months, if 93 and younger. The vaccine is not recommended for use in pregnant females. However, pregnancy testing is not needed before receiving a dose.  Measles, mumps, rubella (MMR) vaccine.** / You need at least 1 dose of MMR if you were born in 1957 or later. You may also need a 2nd dose. For females of childbearing age, rubella immunity should be determined. If there is no evidence of immunity, females who are not pregnant should be vaccinated. If there is no evidence of immunity, females who are  pregnant should delay immunization until after pregnancy.  Pneumococcal  13-valent conjugate (PCV13) vaccine.** / Consult your health care provider.  Pneumococcal polysaccharide (PPSV23) vaccine.** / 1 to 2 doses if you smoke cigarettes or if you have certain conditions.  Meningococcal vaccine.** / 1 dose if you are age 68 to 8 years and a Market researcher living in a residence hall, or have one of several medical conditions, you need to get vaccinated against meningococcal disease. You may also need additional booster doses.  Hepatitis A vaccine.** / Consult your health care provider.  Hepatitis B vaccine.** / Consult your health care provider.  Haemophilus influenzae type b (Hib) vaccine.** / Consult your health care provider. Ages 7 to 53 years  Blood pressure check.** / Every year.  Lipid and cholesterol check.** / Every 5 years beginning at age 25 years.  Lung cancer screening. / Every year if you are aged 11-80 years and have a 30-pack-year history of smoking and currently smoke or have quit within the past 15 years. Yearly screening is stopped once you have quit smoking for at least 15 years or develop a health problem that would prevent you from having lung cancer treatment.  Clinical breast exam.** / Every year after age 48 years.  BRCA-related cancer risk assessment.** / For women who have family members with a BRCA-related cancer (breast, ovarian, tubal, or peritoneal cancers).  Mammogram.** / Every year beginning at age 41 years and continuing for as long as you are in good health. Consult with your health care provider.  Pap test.** / Every 3 years starting at age 65 years through age 37 or 70 years with a history of 3 consecutive normal Pap tests.  HPV screening.** / Every 3 years from ages 72 years through ages 60 to 40 years with a history of 3 consecutive normal Pap tests.  Fecal occult blood test (FOBT) of stool. / Every year beginning at age 21 years and continuing until age 5 years. You may not need to do this test if you get  a colonoscopy every 10 years.  Flexible sigmoidoscopy or colonoscopy.** / Every 5 years for a flexible sigmoidoscopy or every 10 years for a colonoscopy beginning at age 35 years and continuing until age 48 years.  Hepatitis C blood test.** / For all people born from [birth year] through 1965 and any individual with known risks for hepatitis C.  Skin self-exam. / Monthly.  Influenza vaccine. / Every year.  Tetanus, diphtheria, and acellular pertussis (Tdap/Td) vaccine.** / Consult your health care provider. Pregnant women should receive 1 dose of Tdap vaccine during each pregnancy. 1 dose of Td every 10 years.  Varicella vaccine.** / Consult your health care provider. Pregnant females who do not have evidence of immunity should receive the first dose after pregnancy.  Zoster vaccine.** / 1 dose for adults aged 30 years or older.  Measles, mumps, rubella (MMR) vaccine.** / You need at least 1 dose of MMR if you were born in 1957 or later. You may also need a second dose. For females of childbearing age, rubella immunity should be determined. If there is no evidence of immunity, females who are not pregnant should be vaccinated. If there is no evidence of immunity, females who are pregnant should delay immunization until after pregnancy.  Pneumococcal 13-valent conjugate (PCV13) vaccine.** / Consult your health care provider.  Pneumococcal polysaccharide (PPSV23) vaccine.** / 1 to 2 doses if you smoke cigarettes or if you have certain conditions.  Meningococcal vaccine.** /  Consult your health care provider.  Hepatitis A vaccine.** / Consult your health care provider.  Hepatitis B vaccine.** / Consult your health care provider.  Haemophilus influenzae type b (Hib) vaccine.** / Consult your health care provider. Ages 64 years and over  Blood pressure check.** / Every year.  Lipid and cholesterol check.** / Every 5 years beginning at age 23 years.  Lung cancer screening. / Every year if you  are aged 16-80 years and have a 30-pack-year history of smoking and currently smoke or have quit within the past 15 years. Yearly screening is stopped once you have quit smoking for at least 15 years or develop a health problem that would prevent you from having lung cancer treatment.  Clinical breast exam.** / Every year after age 74 years.  BRCA-related cancer risk assessment.** / For women who have family members with a BRCA-related cancer (breast, ovarian, tubal, or peritoneal cancers).  Mammogram.** / Every year beginning at age 44 years and continuing for as long as you are in good health. Consult with your health care provider.  Pap test.** / Every 3 years starting at age 58 years through age 22 or 39 years with 3 consecutive normal Pap tests. Testing can be stopped between 65 and 70 years with 3 consecutive normal Pap tests and no abnormal Pap or HPV tests in the past 10 years.  HPV screening.** / Every 3 years from ages 64 years through ages 70 or 61 years with a history of 3 consecutive normal Pap tests. Testing can be stopped between 65 and 70 years with 3 consecutive normal Pap tests and no abnormal Pap or HPV tests in the past 10 years.  Fecal occult blood test (FOBT) of stool. / Every year beginning at age 40 years and continuing until age 27 years. You may not need to do this test if you get a colonoscopy every 10 years.  Flexible sigmoidoscopy or colonoscopy.** / Every 5 years for a flexible sigmoidoscopy or every 10 years for a colonoscopy beginning at age 7 years and continuing until age 32 years.  Hepatitis C blood test.** / For all people born from [birth year] through 1967 and any individual with known risks for hepatitis C.  Osteoporosis screening.** / A one-time screening for women ages 30 years and over and women at risk for fractures or osteoporosis.  Skin self-exam. / Monthly.  Influenza vaccine. / Every year.  Tetanus, diphtheria, and acellular pertussis (Tdap/Td)  vaccine.** / 1 dose of Td every 10 years.  Varicella vaccine.** / Consult your health care provider.  Zoster vaccine.** / 1 dose for adults aged 35 years or older.  Pneumococcal 13-valent conjugate (PCV13) vaccine.** / Consult your health care provider.  Pneumococcal polysaccharide (PPSV23) vaccine.** / 1 dose for all adults aged 46 years and older.  Meningococcal vaccine.** / Consult your health care provider.  Hepatitis A vaccine.** / Consult your health care provider.  Hepatitis B vaccine.** / Consult your health care provider.  Haemophilus influenzae type b (Hib) vaccine.** / Consult your health care provider. ** Family history and personal history of risk and conditions may change your health care provider's recommendations.   This information is not intended to replace advice given to you by your health care provider. Make sure you discuss any questions you have with your health care provider.   Document Released: 12/29/2001 Document Revised: 11/23/2014 Document Reviewed: 03/30/2011 Elsevier Interactive Patient Education [employer].

## 2016-01-28 LAB — HEPATITIS C ANTIBODY: HCV Ab: NEGATIVE

## 2016-01-29 LAB — CYTOLOGY - PAP

## 2016-03-23 ENCOUNTER — Other Ambulatory Visit: Payer: Self-pay | Admitting: Family Medicine

## 2016-04-06 ENCOUNTER — Ambulatory Visit (INDEPENDENT_AMBULATORY_CARE_PROVIDER_SITE_OTHER): Payer: Medicare Other | Admitting: Family Medicine

## 2016-04-06 ENCOUNTER — Encounter: Payer: Self-pay | Admitting: Family Medicine

## 2016-04-06 VITALS — BP 150/78 | HR 72 | Temp 98.1°F | Ht 65.0 in | Wt 207.0 lb

## 2016-04-06 DIAGNOSIS — R5381 Other malaise: Secondary | ICD-10-CM | POA: Diagnosis not present

## 2016-04-06 DIAGNOSIS — R635 Abnormal weight gain: Secondary | ICD-10-CM | POA: Diagnosis not present

## 2016-04-06 DIAGNOSIS — R5383 Other fatigue: Secondary | ICD-10-CM | POA: Diagnosis not present

## 2016-04-06 DIAGNOSIS — D352 Benign neoplasm of pituitary gland: Secondary | ICD-10-CM | POA: Diagnosis not present

## 2016-04-06 NOTE — Patient Instructions (Signed)
Pituitary Tumors  Pituitary tumors are abnormal growths found in the pituitary gland. The pituitary gland is a small organ--about the size of a dime--located in the center of the brain. It makes hormones that affect growth and the functions of other glands in the body. Most pituitary tumors are benign. This means they are noncancerous. They grow slowly and do not spread to other parts of the body.  A pituitary tumor may make the pituitary gland produce too many hormones. Tumors that make hormones are called functioning tumors (those that do not make hormones are called nonfunctioning tumors). Problems that can be caused by pituitary tumors include:  · Cushing disease. This disease causes fat to build up in the face, back, and chest while the arms and legs become thin.  · Acromegaly. This is a condition in which the hands, feet, and face are larger than normal.  · Breast milk production even though there is no pregnancy.  CAUSES   The cause of most pituitary tumors is not known. In some cases, these kinds of tumors run in a family.  RISK FACTORS  Some cases of pituitary tumors are due to genetic factors that a person inherits that increase the likelihood of developing certain tumors, including pituitary tumors.   SIGNS AND SYMPTOMS   · Headaches.    · Vision problems.    · Weakness or low energy.  · Clear fluid draining from the nose.  · Changes in the sense of smell.  · Feeling sick to your stomach (nauseous) and vomiting.    · Problems caused by the production of too many hormones, such as:      Infertility.    Loss of menstrual periods in women.      Abnormal growth.      High blood pressure (hypertension).      Heat or cold intolerance.      Other skin and body changes.      Nipple discharge.    Decreased sexual function.  DIAGNOSIS   If you develop symptoms, you will be sent for a CT scan or MRI to look for pituitary tumors. If you know that these kinds of tumors run in your family, you may need to have your  blood tested regularly to monitor pituitary hormone levels.  TREATMENT   These tumors are best treated when they are found and diagnosed early. Treatments include:   · Surgical removal of the tumor. This is the most common treatment.  · Radiation therapy. During this treatment, high doses of X-rays are used to kill tumor cells.  · Drug therapy. This involves using certain medicines to block the pituitary gland from producing too many hormones.  HOME CARE INSTRUCTIONS  · Drink plenty of fluids.  · Measure your urine output if directed to do so by your health care provider.  · Do not pick your nose or remove any crusting.  · Do not do any activities that require straining.  · Take all medicines as directed by your health care provider.  · Keep follow-up appointments as directed by your health care provider.  SEEK MEDICAL CARE IF:  · You have sudden, unusual thirst.  · You are urinating frequently.  · You have a headache that will not go away.  · You have new vision changes.  · You notice clear fluid leaking from your nose or ears, a sensation of fluid trickling down the back of your throat, or a salty taste in your mouth.  · You are having trouble concentrating.  SEEK IMMEDIATE MEDICAL CARE IF:  ·   Your symptoms suddenly become severe.  · You have a nosebleed that does not stop after a few minutes.  · You have a fever over 101°F (38.3°C).  · You have a severe headache or a stiff neck.  · You are confused or not as alert as usual.  · You have chest pain or shortness of breath.     This information is not intended to replace advice given to you by your health care provider. Make sure you discuss any questions you have with your health care provider.     Document Released: 10/23/2002 Document Revised: 08/23/2013 Document Reviewed: 05/05/2013  Elsevier Interactive Patient Education ©2016 Elsevier Inc.

## 2016-04-06 NOTE — Progress Notes (Signed)
Pre visit review using our clinic review tool, if applicable. No additional management support is needed unless otherwise documented below in the visit note. 

## 2016-04-06 NOTE — Progress Notes (Signed)
Patient ID: Kristin Delgado, female    DOB: [birth year]  Age: 60 y.o. MRN: [id number]    Subjective:  Subjective HPI [name] presents for f/u and c/o weight gain especially in midsection and she is concerned about cushinings and reoccurance of her tumor.   No other symptoms Review of Systems  Constitutional: Positive for unexpected weight change. Negative for diaphoresis, appetite change and fatigue.  Eyes: Negative for pain, redness and visual disturbance.  Respiratory: Negative for cough, chest tightness, shortness of breath and wheezing.   Cardiovascular: Negative for chest pain, palpitations and leg swelling.  Endocrine: Negative for cold intolerance, heat intolerance, polydipsia, polyphagia and polyuria.  Genitourinary: Negative for dysuria, frequency and difficulty urinating.  Neurological: Negative for dizziness, light-headedness, numbness and headaches.    History Past Medical History  Diagnosis Date  . Hypertension   . Chest pain 2006    admitted, essentially negative  . Pituitary microadenoma ([place])   . Hyperlipidemia   . Allergy     seasonal  . Anxiety     closed space like MRI  . 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   Her family history includes Alzheimer's disease in her father; Cancer (age of onset: 62) in her mother; Dementia in her father; Lung cancer in her mother.She reports that she has never smoked. She has never used smokeless tobacco. She reports that she does not drink alcohol or use illicit drugs.  Current Outpatient Prescriptions on File Prior to Visit  Medication Sig Dispense Refill  . acetaminophen (TYLENOL ARTHRITIS PAIN) 650 MG CR tablet Take 650 mg by mouth every 8 (eight) hours as needed. pain     . Ascorbic Acid (VITAMIN C) 1000 MG tablet Take 1,000 mg by mouth 2 (two) times daily.      [name] atorvastatin  (LIPITOR) 20 MG tablet TAKE 1 TABLET DAILY 90 tablet 1  . bromocriptine (PARLODEL) 2.5 MG tablet Take 1 tablet (2.5 mg total) by mouth daily. 90 tablet 3  . famotidine (PEPCID AC) 10 MG chewable tablet Chew 10 mg by mouth daily.      . fenofibrate 160 MG tablet TAKE 1 TABLET DAILY 90 tablet 1  . hydrochlorothiazide (HYDRODIURIL) 25 MG tablet TAKE 1 TABLET DAILY 90 tablet 1  . lisinopril (PRINIVIL,ZESTRIL) 10 MG tablet TAKE ONE-HALF (1/2) TABLET DAILY 45 tablet 1  . Multiple Vitamin (MULTIVITAMIN) tablet Take 1 tablet by mouth daily.      . Omega-3 Fatty Acids (FISH OIL) 1200 MG CAPS Take 1 capsule by mouth 2 (two) times daily.    . Potassium Gluconate ([employer] POTASSIUM GLUCONATE) 2 MEQ TABS Take 1 tablet by mouth daily.     No current facility-administered medications on file prior to visit.     Objective:  Objective Physical Exam  Constitutional: She is oriented to person, place, and time. She appears well-developed and well-nourished.  HENT:  Head: Normocephalic and atraumatic.  Eyes: Conjunctivae and EOM are normal.  Neck: Normal range of motion. Neck supple. No JVD present. Carotid bruit is not present. No thyromegaly present.  Cardiovascular: Normal rate, regular rhythm and normal heart sounds.   No murmur heard. Pulmonary/Chest: Effort normal and breath sounds normal. No respiratory distress. She has no wheezes. She has no rales. She exhibits no tenderness.  Musculoskeletal: She exhibits no edema.  Neurological: She is alert and oriented to person, place, and time.  Psychiatric: She has a normal mood and affect. Her behavior is normal. Thought content normal.   BP 150/78 mmHg  Pulse 72  Temp(Src) 98.1 F (36.7 C) (Oral)  Ht 5\' 5"  (1.651 m)  Wt 207 lb (93.895 kg)  BMI 34.45 kg/m2  SpO2 100% Wt Readings from Last 3 Encounters:  04/06/16 207 lb (93.895 kg)  01/27/16 208 lb (94.348 kg)  07/25/15 207 lb 12.8 oz (94.257 kg)     Lab Results  Component Value Date   WBC 7.3  04/06/2016   HGB 12.3 04/06/2016   HCT 37.5 04/06/2016   PLT 221.0 04/06/2016   GLUCOSE 99 04/06/2016   CHOL 183 01/27/2016   TRIG 162.0* 01/27/2016   HDL 43.60 01/27/2016   LDLDIRECT 162.0 05/02/2015   LDLCALC 107* 01/27/2016   ALT 36* 04/06/2016   AST 28 04/06/2016   NA 142 04/06/2016   K 4.2 04/06/2016   CL 104 04/06/2016   CREATININE 0.97 04/06/2016   BUN 20 04/06/2016   CO2 30 04/06/2016   TSH 2.58 04/06/2016    No results found.   Assessment & Plan:  Plan I am having [name] maintain her famotidine, multivitamin, vitamin C, acetaminophen, Fish Oil, lisinopril, hydrochlorothiazide, Potassium Gluconate, bromocriptine, fenofibrate, and atorvastatin.  No orders of the defined types were placed in this encounter.    Problem List Items Addressed This Visit    None    Visit Diagnoses    Abnormal weight gain    -  Primary    Relevant Orders    Cortisol, urine, 24 hour (Completed)    TSH (Completed)    CBC with Differential/Platelet (Completed)    Comprehensive metabolic panel (Completed)    Pituitary adenoma ([place])        Relevant Orders    Prolactin (Completed)    TSH (Completed)    CBC with Differential/Platelet (Completed)    Comprehensive metabolic panel (Completed)    Malaise and fatigue        Relevant Orders    TSH (Completed)    CBC with Differential/Platelet (Completed)    Comprehensive metabolic panel (Completed)       Follow-up: Return in about 6 months (around 10/07/2016), or if symptoms worsen or fail to improve.  [name], DO

## 2016-04-07 LAB — COMPREHENSIVE METABOLIC PANEL
ALBUMIN: 4.6 g/dL (ref 3.5–5.2)
ALT: 36 U/L — AB (ref 0–35)
AST: 28 U/L (ref 0–37)
Alkaline Phosphatase: 30 U/L — ABNORMAL LOW (ref 39–117)
BUN: 20 mg/dL (ref 6–23)
CALCIUM: 10.6 mg/dL — AB (ref 8.4–10.5)
CHLORIDE: 104 meq/L (ref 96–112)
CO2: 30 meq/L (ref 19–32)
CREATININE: 0.97 mg/dL (ref 0.40–1.20)
GFR: 61.95 mL/min (ref >60.00)
Glucose, Bld: 99 mg/dL (ref 70–99)
Potassium: 4.2 mEq/L (ref 3.5–5.1)
Sodium: 142 mEq/L (ref 135–145)
TOTAL PROTEIN: 7.3 g/dL (ref 6.0–8.3)
Total Bilirubin: 0.4 mg/dL (ref 0.2–1.2)

## 2016-04-07 LAB — TSH: TSH: 2.58 u[IU]/mL (ref 0.35–4.50)

## 2016-04-07 LAB — CBC WITH DIFFERENTIAL/PLATELET
Basophils Absolute: 0 10*3/uL (ref 0.0–0.1)
Basophils Relative: 0.3 % (ref 0.0–3.0)
EOS PCT: 1.2 % (ref 0.0–5.0)
Eosinophils Absolute: 0.1 10*3/uL (ref 0.0–0.7)
HEMATOCRIT: 37.5 % (ref 36.0–46.0)
Hemoglobin: 12.3 g/dL (ref 12.0–15.0)
LYMPHS ABS: 1.8 10*3/uL (ref 0.7–4.0)
Lymphocytes Relative: 24.9 % (ref 12.0–46.0)
MCHC: 32.8 g/dL (ref 30.0–36.0)
MCV: 80.1 fl (ref 78.0–100.0)
MONOS PCT: 5.4 % (ref 3.0–12.0)
Monocytes Absolute: 0.4 10*3/uL (ref 0.1–1.0)
NEUTROS PCT: 68.2 % (ref 43.0–77.0)
Neutro Abs: 5 10*3/uL (ref 1.4–7.7)
Platelets: 221 10*3/uL (ref 150.0–400.0)
RBC: 4.69 Mil/uL (ref 3.87–5.11)
RDW: 14.6 % (ref 11.5–15.5)
WBC: 7.3 10*3/uL (ref 4.0–10.5)

## 2016-04-07 LAB — PROLACTIN: Prolactin: 8.4 ng/mL

## 2016-04-08 ENCOUNTER — Other Ambulatory Visit (INDEPENDENT_AMBULATORY_CARE_PROVIDER_SITE_OTHER): Payer: Medicare Other

## 2016-04-08 DIAGNOSIS — R635 Abnormal weight gain: Secondary | ICD-10-CM

## 2016-04-14 LAB — CORTISOL, URINE, 24 HOUR
CORTISOL (UR), FREE: 9.4 ug/(24.h) (ref 4.0–50.0)
RESULTS RECEIVED: 1.55 g/(24.h) (ref 0.63–2.50)

## 2016-04-15 ENCOUNTER — Telehealth: Payer: Self-pay

## 2016-04-15 NOTE — Telephone Encounter (Signed)
Notes Recorded by [name], DO on 04/13/2016 at 11:10 AM Calcium is high---- check fasting PTH  Patient has been made aware and will come in tomorrow for the fasting PTH, she will hold off on the MRI until she has results back.              KP

## 2016-04-15 NOTE — Telephone Encounter (Signed)
-----   Message from [name], DO sent at 04/15/2016 10:18 AM EDT ----- Labs good but we may need MRI brain if she is concerned about reoccurance of tumor

## 2016-04-15 NOTE — Telephone Encounter (Signed)
noted 

## 2016-04-16 ENCOUNTER — Other Ambulatory Visit (INDEPENDENT_AMBULATORY_CARE_PROVIDER_SITE_OTHER): Payer: 59

## 2016-04-17 LAB — PTH, INTACT AND CALCIUM
CALCIUM: 10.1 mg/dL (ref 8.4–10.5)
PTH: 17 pg/mL (ref 14–64)

## 2016-06-11 ENCOUNTER — Other Ambulatory Visit: Payer: Self-pay | Admitting: Family Medicine

## 2016-06-12 ENCOUNTER — Other Ambulatory Visit: Payer: Self-pay | Admitting: Family Medicine

## 2016-07-30 ENCOUNTER — Encounter: Payer: Self-pay | Admitting: Family Medicine

## 2016-07-30 ENCOUNTER — Ambulatory Visit (INDEPENDENT_AMBULATORY_CARE_PROVIDER_SITE_OTHER): Payer: Medicare Other | Admitting: Family Medicine

## 2016-07-30 VITALS — BP 138/72 | HR 61 | Temp 98.0°F | Resp 16 | Ht 65.0 in | Wt 209.9 lb

## 2016-07-30 DIAGNOSIS — E785 Hyperlipidemia, unspecified: Secondary | ICD-10-CM | POA: Diagnosis not present

## 2016-07-30 DIAGNOSIS — I10 Essential (primary) hypertension: Secondary | ICD-10-CM

## 2016-07-30 DIAGNOSIS — Z8639 Personal history of other endocrine, nutritional and metabolic disease: Secondary | ICD-10-CM | POA: Diagnosis not present

## 2016-07-30 DIAGNOSIS — E559 Vitamin D deficiency, unspecified: Secondary | ICD-10-CM | POA: Diagnosis not present

## 2016-07-30 LAB — COMPREHENSIVE METABOLIC PANEL
ALBUMIN: 4.4 g/dL (ref 3.5–5.2)
ALK PHOS: 36 U/L — AB (ref 39–117)
ALT: 31 U/L (ref 0–35)
AST: 21 U/L (ref 0–37)
BUN: 17 mg/dL (ref 6–23)
CALCIUM: 10.1 mg/dL (ref 8.4–10.5)
CHLORIDE: 104 meq/L (ref 96–112)
CO2: 33 mEq/L — ABNORMAL HIGH (ref 19–32)
CREATININE: 0.89 mg/dL (ref 0.40–1.20)
GFR: 68.35 mL/min (ref >60.00)
Glucose, Bld: 126 mg/dL — ABNORMAL HIGH (ref 70–99)
POTASSIUM: 4.4 meq/L (ref 3.5–5.1)
Sodium: 142 mEq/L (ref 135–145)
TOTAL PROTEIN: 7.2 g/dL (ref 6.0–8.3)
Total Bilirubin: 0.3 mg/dL (ref 0.2–1.2)

## 2016-07-30 LAB — CBC
HEMATOCRIT: 37 % (ref 36.0–46.0)
Hemoglobin: 12.3 g/dL (ref 12.0–15.0)
MCHC: 33.3 g/dL (ref 30.0–36.0)
MCV: 79.5 fl (ref 78.0–100.0)
Platelets: 233 10*3/uL (ref 150.0–400.0)
RBC: 4.65 Mil/uL (ref 3.87–5.11)
RDW: 14.8 % (ref 11.5–15.5)
WBC: 6.3 10*3/uL (ref 4.0–10.5)

## 2016-07-30 LAB — LIPID PANEL
CHOL/HDL RATIO: 5
CHOLESTEROL: 174 mg/dL (ref 0–200)
HDL: 35.3 mg/dL — AB (ref >39.00)
NonHDL: 138.21
Triglycerides: 214 mg/dL — ABNORMAL HIGH (ref 0.0–149.0)
VLDL: 42.8 mg/dL — AB (ref 0.0–40.0)

## 2016-07-30 LAB — POCT URINALYSIS DIPSTICK
BILIRUBIN UA: NEGATIVE
GLUCOSE UA: NEGATIVE
KETONES UA: NEGATIVE
Leukocytes, UA: NEGATIVE
Nitrite, UA: NEGATIVE
Protein, UA: NEGATIVE
RBC UA: NEGATIVE
UROBILINOGEN UA: 0.2
pH, UA: 6

## 2016-07-30 LAB — VITAMIN D 25 HYDROXY (VIT D DEFICIENCY, FRACTURES): VITD: 40.77 ng/mL (ref 30.00–100.00)

## 2016-07-30 LAB — LDL CHOLESTEROL, DIRECT: Direct LDL: 107 mg/dL

## 2016-07-30 NOTE — Patient Instructions (Signed)

## 2016-07-30 NOTE — Progress Notes (Signed)
Pre visit review using our clinic review tool, if applicable. No additional management support is needed unless otherwise documented below in the visit note. 

## 2016-07-30 NOTE — Progress Notes (Signed)
Patient ID: Kristin Delgado, female    DOB: 07/17/1956  Age: 60 y.o. MRN: [id number]    Subjective:  Subjective  HPI Kristin Delgado presents for bp and cholesterol f/u  Review of Systems  Constitutional: Negative for appetite change, diaphoresis, fatigue and unexpected weight change.  Eyes: Negative for pain, redness and visual disturbance.  Respiratory: Negative for cough, chest tightness, shortness of breath and wheezing.   Cardiovascular: Negative for chest pain, palpitations and leg swelling.  Endocrine: Negative for cold intolerance, heat intolerance, polydipsia, polyphagia and polyuria.  Genitourinary: Negative for difficulty urinating, dysuria and frequency.  Neurological: Negative for dizziness, light-headedness, numbness and headaches.    History Past Medical History:  Diagnosis Date  . Allergy    seasonal  . Anxiety    closed space like MRI  . Chest pain 2006   admitted, essentially negative  . GERD (gastroesophageal reflux disease)   . Hyperlipidemia   . Hypertension   . 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   Her family history includes Alzheimer's disease in her father; Cancer (age of onset: 32) in her mother; Dementia in her father; Lung cancer in her mother.She reports that she has never smoked. She has never used smokeless tobacco. She reports that she does not drink alcohol or use drugs.  Current Outpatient Prescriptions on File Prior to Visit  Medication Sig Dispense Refill  . acetaminophen (TYLENOL ARTHRITIS PAIN) 650 MG CR tablet Take 650 mg by mouth every 8 (eight) hours as needed. pain     . Ascorbic Acid (VITAMIN C) 1000 MG tablet Take 1,000 mg by mouth 2 (two) times daily.      [name] atorvastatin (LIPITOR) 20 MG tablet TAKE 1 TABLET DAILY 90 tablet 1  . bromocriptine (PARLODEL) 2.5 MG tablet Take 1 tablet (2.5 mg total) by mouth  daily. 90 tablet 3  . famotidine (PEPCID AC) 10 MG chewable tablet Chew 10 mg by mouth daily.      . fenofibrate 160 MG tablet TAKE 1 TABLET DAILY 90 tablet 1  . hydrochlorothiazide (HYDRODIURIL) 25 MG tablet TAKE 1 TABLET DAILY 90 tablet 1  . lisinopril (PRINIVIL,ZESTRIL) 10 MG tablet TAKE ONE-HALF (1/2) TABLET DAILY 45 tablet 1  . Multiple Vitamin (MULTIVITAMIN) tablet Take 1 tablet by mouth daily.      . Omega-3 Fatty Acids (FISH OIL) 1200 MG CAPS Take 1 capsule by mouth 2 (two) times daily.    . Potassium Gluconate ([employer] POTASSIUM GLUCONATE) 2 MEQ TABS Take 1 tablet by mouth daily.     No current facility-administered medications on file prior to visit.      Objective:  Objective  Physical Exam  Constitutional: She is oriented to person, place, and time. She appears well-developed and well-nourished.  HENT:  Head: Normocephalic and atraumatic.  Eyes: Conjunctivae and EOM are normal.  Neck: Normal range of motion. Neck supple. No JVD present. Carotid bruit is not present. No thyromegaly present.  Cardiovascular: Normal rate, regular rhythm and normal heart sounds.   No murmur heard. Pulmonary/Chest: Effort normal and breath sounds normal. No respiratory distress. She has no wheezes. She has no rales. She exhibits no tenderness.  Musculoskeletal: She exhibits no edema.  Neurological: She is alert and oriented to person, place, and time.  Psychiatric: She has a normal mood and affect. Her behavior is normal. Judgment and thought content normal.  Nursing note and  vitals reviewed.  BP 138/72 (BP Location: Left Arm, Patient Position: Sitting, Cuff Size: Large)   Pulse 61   Temp 98 F (36.7 C) (Oral)   Resp 16   Ht 5\' 5"  (1.651 m)   Wt 209 lb 14.4 oz (95.2 kg)   SpO2 97%   BMI 34.93 kg/m  Wt Readings from Last 3 Encounters:  07/30/16 209 lb 14.4 oz (95.2 kg)  04/06/16 207 lb (93.9 kg)  01/27/16 208 lb (94.3 kg)     Lab Results  Component Value Date   WBC 6.3 07/30/2016    HGB 12.3 07/30/2016   HCT 37.0 07/30/2016   PLT 233.0 07/30/2016   GLUCOSE 126 (H) 07/30/2016   CHOL 174 07/30/2016   TRIG 214.0 (H) 07/30/2016   HDL 35.30 (L) 07/30/2016   LDLDIRECT 107.0 07/30/2016   LDLCALC 107 (H) 01/27/2016   ALT 31 07/30/2016   AST 21 07/30/2016   NA 142 07/30/2016   K 4.4 07/30/2016   CL 104 07/30/2016   CREATININE 0.89 07/30/2016   BUN 17 07/30/2016   CO2 33 (H) 07/30/2016   TSH 2.58 04/06/2016    No results found.   Assessment & Plan:  Plan  I am having Ms. Mcmahill maintain her famotidine, multivitamin, vitamin C, acetaminophen, Fish Oil, Potassium Gluconate, bromocriptine, fenofibrate, atorvastatin, hydrochlorothiazide, and lisinopril.  No orders of the defined types were placed in this encounter.   Problem List Items Addressed This Visit      Unprioritized   Essential hypertension - Primary   Relevant Orders   Comprehensive metabolic panel (Completed)   CBC (Completed)   Lipid panel (Completed)   POCT urinalysis dipstick (Completed)   Vitamin D (25 hydroxy) (Completed)   Hyperlipidemia LDL goal <100   Relevant Orders   Comprehensive metabolic panel (Completed)   Lipid panel (Completed)    Other Visit Diagnoses    History of hypercalcemia       Relevant Orders   Comprehensive metabolic panel (Completed)   Vitamin D (25 hydroxy) (Completed)   Vitamin D deficiency       Relevant Orders   Vitamin D (25 hydroxy) (Completed)      Follow-up: Return in about 6 months (around 01/27/2017) for hypertension, hyperlipidemia.  [name], DO

## 2016-09-01 ENCOUNTER — Other Ambulatory Visit: Payer: Self-pay

## 2016-09-01 DIAGNOSIS — E785 Hyperlipidemia, unspecified: Secondary | ICD-10-CM

## 2016-09-01 DIAGNOSIS — E119 Type 2 diabetes mellitus without complications: Secondary | ICD-10-CM

## 2016-09-02 ENCOUNTER — Other Ambulatory Visit: Payer: Self-pay | Admitting: Family Medicine

## 2016-11-13 ENCOUNTER — Other Ambulatory Visit: Payer: Self-pay | Admitting: Family Medicine

## 2016-11-17 ENCOUNTER — Other Ambulatory Visit: Payer: Medicare Other

## 2016-12-29 ENCOUNTER — Other Ambulatory Visit: Payer: Self-pay | Admitting: Family Medicine

## 2016-12-29 DIAGNOSIS — D352 Benign neoplasm of pituitary gland: Secondary | ICD-10-CM

## 2016-12-29 MED ORDER — BROMOCRIPTINE MESYLATE 2.5 MG PO TABS: 2.5000 mg | ORAL_TABLET | Freq: Every day | ORAL | 0 refills | Status: DC

## 2016-12-29 MED ORDER — LISINOPRIL 10 MG PO TABS: 5.0000 mg | ORAL_TABLET | Freq: Every day | ORAL | 1 refills | Status: DC

## 2016-12-29 MED ORDER — HYDROCHLOROTHIAZIDE 25 MG PO TABS: 25.0000 mg | ORAL_TABLET | Freq: Every day | ORAL | 1 refills | Status: DC

## 2016-12-29 MED ORDER — ATORVASTATIN CALCIUM 20 MG PO TABS: 20.0000 mg | ORAL_TABLET | Freq: Every day | ORAL | 1 refills | Status: DC

## 2016-12-29 MED ORDER — FENOFIBRATE 160 MG PO TABS: 160.0000 mg | ORAL_TABLET | Freq: Every day | ORAL | 1 refills | Status: DC

## 2017-01-19 ENCOUNTER — Telehealth: Payer: Self-pay | Admitting: Family Medicine

## 2017-01-19 NOTE — Telephone Encounter (Signed)
Error

## 2017-01-28 ENCOUNTER — Encounter: Payer: Self-pay | Admitting: Family Medicine

## 2017-01-28 ENCOUNTER — Ambulatory Visit (INDEPENDENT_AMBULATORY_CARE_PROVIDER_SITE_OTHER): Payer: Medicare Other | Admitting: Family Medicine

## 2017-01-28 VITALS — BP 122/70 | HR 60 | Temp 98.1°F | Resp 16 | Ht 64.0 in | Wt 202.4 lb

## 2017-01-28 DIAGNOSIS — R739 Hyperglycemia, unspecified: Secondary | ICD-10-CM

## 2017-01-28 DIAGNOSIS — I10 Essential (primary) hypertension: Secondary | ICD-10-CM

## 2017-01-28 DIAGNOSIS — D353 Benign neoplasm of craniopharyngeal duct: Secondary | ICD-10-CM | POA: Diagnosis not present

## 2017-01-28 DIAGNOSIS — D352 Benign neoplasm of pituitary gland: Secondary | ICD-10-CM

## 2017-01-28 DIAGNOSIS — E785 Hyperlipidemia, unspecified: Secondary | ICD-10-CM | POA: Diagnosis not present

## 2017-01-28 LAB — LIPID PANEL
CHOL/HDL RATIO: 4
CHOLESTEROL: 160 mg/dL (ref 0–200)
HDL: 37.1 mg/dL — ABNORMAL LOW (ref >39.00)
NonHDL: 122.9
Triglycerides: 206 mg/dL — ABNORMAL HIGH (ref 0.0–149.0)
VLDL: 41.2 mg/dL — ABNORMAL HIGH (ref 0.0–40.0)

## 2017-01-28 LAB — COMPREHENSIVE METABOLIC PANEL
ALBUMIN: 4.3 g/dL (ref 3.5–5.2)
ALT: 34 U/L (ref 0–35)
AST: 29 U/L (ref 0–37)
Alkaline Phosphatase: 36 U/L — ABNORMAL LOW (ref 39–117)
BUN: 19 mg/dL (ref 6–23)
CHLORIDE: 103 meq/L (ref 96–112)
CO2: 27 mEq/L (ref 19–32)
CREATININE: 0.88 mg/dL (ref 0.40–1.20)
Calcium: 10.1 mg/dL (ref 8.4–10.5)
GFR: 69.14 mL/min (ref >60.00)
GLUCOSE: 117 mg/dL — AB (ref 70–99)
Potassium: 3.3 mEq/L — ABNORMAL LOW (ref 3.5–5.1)
SODIUM: 140 meq/L (ref 135–145)
TOTAL PROTEIN: 7.4 g/dL (ref 6.0–8.3)
Total Bilirubin: 0.3 mg/dL (ref 0.2–1.2)

## 2017-01-28 LAB — HEMOGLOBIN A1C: HEMOGLOBIN A1C: 6.1 % (ref 4.6–6.5)

## 2017-01-28 LAB — LDL CHOLESTEROL, DIRECT: Direct LDL: 83 mg/dL

## 2017-01-28 MED ORDER — ZOSTER VAC RECOMB ADJUVANTED 50 MCG/0.5ML IM SUSR: 0.5000 mL | Freq: Once | INTRAMUSCULAR | 1 refills | Status: AC

## 2017-01-28 NOTE — Assessment & Plan Note (Signed)
Tolerating statin, encouraged heart healthy diet, avoid trans fats, minimize simple carbs and saturated fats. Increase exercise as tolerated 

## 2017-01-28 NOTE — Assessment & Plan Note (Signed)
Well controlled, no changes to meds. Encouraged heart healthy diet such as the DASH diet and exercise as tolerated.  °

## 2017-01-28 NOTE — Progress Notes (Signed)
Pre visit review using our clinic review tool, if applicable. No additional management support is needed unless otherwise documented below in the visit note. 

## 2017-01-28 NOTE — Progress Notes (Signed)
Patient ID: Kristin Delgado, female   DOB: 08-30-1956, 61 y.o.   MRN: [id number]     Subjective:  I acted as a Education administrator for Dr. Carollee Herter.  Guerry Bruin, [place]   Patient ID: Kristin Delgado, female    DOB: 08/17/1956, 61 y.o.   MRN: [id number]  Chief Complaint  Patient presents with  . Hypertension  . Hyperlipidemia    HPI  Patient is in today for follow up blood pressure and cholesterol. No other complaints.  Patient states that everything is going well.  Patient Care Team: [name], DO as PCP - [place], OD (Optometry) Sanjuana Kava, MD as Referring Physician (Otolaryngology) [facility] as Consulting Physician   Past Medical History:  Diagnosis Date  . Allergy    seasonal  . Anxiety    closed space like MRI  . Chest pain 2006   admitted, essentially negative  . GERD (gastroesophageal reflux disease)   . Hyperlipidemia   . Hypertension   . 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 BRAIN SURGERY  1 / 1985   microadenoma  . COCHLEAR IMPLANT  02/2013   Left side    Family History  Problem Relation Age of Onset  . Lung cancer Mother   . Cancer Mother 48    lung   . Alzheimer's disease Father   . Dementia Father     Social History   Social History  . Marital status: Married    Spouse name: N/A  . Number of children: 3  . Years of education: N/A   Occupational History  . stay at home mom    Social History Main Topics  . Smoking status: Never Smoker  . Smokeless tobacco: Never Used  . Alcohol use No     Comment: rare  . Drug use: No  . Sexual activity: Yes    Partners: Male   Other Topics Concern  . Not on file   Social History Narrative   Exercise-- daily --- walking, 3x a week DVD    Outpatient Medications Prior to Visit  Medication Sig Dispense Refill  . acetaminophen (TYLENOL ARTHRITIS PAIN) 650 MG CR tablet Take  650 mg by mouth every 8 (eight) hours as needed. pain     . Ascorbic Acid (VITAMIN C) 1000 MG tablet Take 1,000 mg by mouth 2 (two) times daily.      [name] atorvastatin (LIPITOR) 20 MG tablet Take 1 tablet (20 mg total) by mouth daily. 90 tablet 1  . bromocriptine (PARLODEL) 2.5 MG tablet Take 1 tablet (2.5 mg total) by mouth daily. 90 tablet 0  . famotidine (PEPCID AC) 10 MG chewable tablet Chew 10 mg by mouth daily.      . fenofibrate 160 MG tablet Take 1 tablet (160 mg total) by mouth daily. 90 tablet 1  . hydrochlorothiazide (HYDRODIURIL) 25 MG tablet Take 1 tablet (25 mg total) by mouth daily. 90 tablet 1  . lisinopril (PRINIVIL,ZESTRIL) 10 MG tablet Take 0.5 tablets (5 mg total) by mouth daily. 45 tablet 1  . Multiple Vitamin (MULTIVITAMIN) tablet Take 1 tablet by mouth daily.      . Omega-3 Fatty Acids (FISH OIL) 1200 MG CAPS Take 1 capsule by mouth 2 (two) times daily.    . Potassium Gluconate (CVS POTASSIUM GLUCONATE) 2 MEQ TABS Take 1 tablet by mouth daily.     No  facility-administered medications prior to visit.     No Known Allergies  Review of Systems  Constitutional: Negative for fever and malaise/fatigue.  HENT: Negative for congestion.   Eyes: Negative for blurred vision.  Respiratory: Negative for cough and shortness of breath.   Cardiovascular: Negative for chest pain, palpitations and leg swelling.  Gastrointestinal: Negative for vomiting.  Musculoskeletal: Negative for back pain.  Skin: Negative for rash.  Neurological: Negative for loss of consciousness and headaches.       Objective:    Physical Exam  Constitutional: She is oriented to person, place, and time. She appears well-developed and well-nourished. No distress.  HENT:  Head: Normocephalic and atraumatic.  Eyes: Conjunctivae are normal.  Neck: Normal range of motion. No thyromegaly present.  Cardiovascular: Normal rate and regular rhythm.   Murmur heard. 2 out 6  Pulmonary/Chest: Effort normal and  breath sounds normal. She has no wheezes.  Abdominal: Soft. Bowel sounds are normal. There is no tenderness.  Musculoskeletal: Normal range of motion. She exhibits no edema or deformity.  Neurological: She is alert and oriented to person, place, and time.  Skin: Skin is warm and dry. She is not diaphoretic.  Psychiatric: She has a normal mood and affect.    BP 122/70 (BP Location: Left Arm, Cuff Size: Large)   Pulse 60   Temp 98.1 F (36.7 C) (Oral)   Resp 16   Ht 5\' 4"  (1.626 m)   Wt 202 lb 6.4 oz (91.8 kg)   SpO2 97%   BMI 34.74 kg/m  Wt Readings from Last 3 Encounters:  01/28/17 202 lb 6.4 oz (91.8 kg)  07/30/16 209 lb 14.4 oz (95.2 kg)  04/06/16 207 lb (93.9 kg)      Immunization History  Administered Date(s) Administered  . Tdap 06/10/2011  . Zoster 07/25/2015    Health Maintenance  Topic Date Due  . MAMMOGRAM  08/23/2015  . INFLUENZA VACCINE  07/30/2017 (Originally 06/16/2016)  . HIV Screening  07/30/2017 (Originally 10/14/1969)  . PAP SMEAR  01/27/2019  . TETANUS/TDAP  06/09/2021  . COLONOSCOPY  08/11/2021  . Hepatitis C Screening  Completed    Lab Results  Component Value Date   WBC 6.3 07/30/2016   HGB 12.3 07/30/2016   HCT 37.0 07/30/2016   PLT 233.0 07/30/2016   GLUCOSE 126 (H) 07/30/2016   CHOL 174 07/30/2016   TRIG 214.0 (H) 07/30/2016   HDL 35.30 (L) 07/30/2016   LDLDIRECT 107.0 07/30/2016   LDLCALC 107 (H) 01/27/2016   ALT 31 07/30/2016   AST 21 07/30/2016   NA 142 07/30/2016   K 4.4 07/30/2016   CL 104 07/30/2016   CREATININE 0.89 07/30/2016   BUN 17 07/30/2016   CO2 33 (H) 07/30/2016   TSH 2.58 04/06/2016    Lab Results  Component Value Date   TSH 2.58 04/06/2016   Lab Results  Component Value Date   WBC 6.3 07/30/2016   HGB 12.3 07/30/2016   HCT 37.0 07/30/2016   MCV 79.5 07/30/2016   PLT 233.0 07/30/2016   Lab Results  Component Value Date   NA 142 07/30/2016   K 4.4 07/30/2016   CO2 33 (H) 07/30/2016   GLUCOSE 126 (H)  07/30/2016   BUN 17 07/30/2016   CREATININE 0.89 07/30/2016   BILITOT 0.3 07/30/2016   ALKPHOS 36 (L) 07/30/2016   AST 21 07/30/2016   ALT 31 07/30/2016   PROT 7.2 07/30/2016   ALBUMIN 4.4 07/30/2016   CALCIUM 10.1 07/30/2016  GFR 68.35 07/30/2016   Lab Results  Component Value Date   CHOL 174 07/30/2016   Lab Results  Component Value Date   HDL 35.30 (L) 07/30/2016   Lab Results  Component Value Date   LDLCALC 107 (H) 01/27/2016   Lab Results  Component Value Date   TRIG 214.0 (H) 07/30/2016   Lab Results  Component Value Date   CHOLHDL 5 07/30/2016   No results found for: HGBA1C       Assessment & Plan:   Problem List Items Addressed This Visit      Unprioritized   PITUITARY MICROADENOMA   Relevant Orders   Prolactin   Essential hypertension - Primary    Well controlled, no changes to meds. Encouraged heart healthy diet such as the DASH diet and exercise as tolerated.       Relevant Orders   Comprehensive metabolic panel   Hyperlipidemia LDL goal <100    Tolerating statin, encouraged heart healthy diet, avoid trans fats, minimize simple carbs and saturated fats. Increase exercise as tolerated      Relevant Orders   Lipid panel    Other Visit Diagnoses    Hyperglycemia       Relevant Orders   Hemoglobin A1c      I am having Ms. Droke start on Zoster Vac Recomb Adjuvanted. I am also having her maintain her famotidine, multivitamin, vitamin C, acetaminophen, Fish Oil, Potassium Gluconate, bromocriptine, lisinopril, atorvastatin, hydrochlorothiazide, and fenofibrate.  Meds ordered this encounter  Medications  . Zoster Vac Recomb Adjuvanted (SHINGRIX) 50 MCG SUSR    Sig: Inject 0.5 mLs into the muscle once.    Dispense:  1 each    Refill:  1    CMA served as scribe during this visit. History, Physical and Plan performed by medical provider. Documentation and orders reviewed and attested to.  [name], DO

## 2017-01-28 NOTE — Patient Instructions (Addendum)
Please do not forget to call and schedule your mammogram.  Hypertension Hypertension, commonly called high blood pressure, is when the force of blood pumping through the arteries is too strong. The arteries are the blood vessels that carry blood from the heart throughout the body. Hypertension forces the heart to work harder to pump blood and may cause arteries to become narrow or stiff. Having untreated or uncontrolled hypertension can cause heart attacks, strokes, kidney disease, and other problems. A blood pressure reading consists of a higher number over a lower number. Ideally, your blood pressure should be below 120/80. The first ("top") number is called the systolic pressure. It is a measure of the pressure in your arteries as your heart beats. The second ("bottom") number is called the diastolic pressure. It is a measure of the pressure in your arteries as the heart relaxes. What are the causes? The cause of this condition is not known. What increases the risk? Some risk factors for high blood pressure are under your control. Others are not. Factors you can change   Smoking.  Having type 2 diabetes mellitus, high cholesterol, or both.  Not getting enough exercise or physical activity.  Being overweight.  Having too much fat, sugar, calories, or salt (sodium) in your diet.  Drinking too much alcohol. Factors that are difficult or impossible to change   Having chronic kidney disease.  Having a family history of high blood pressure.  Age. Risk increases with age.  Race. You may be at higher risk if you are African-American.  Gender. Men are at higher risk than women before age 40. After age 83, women are at higher risk than men.  Having obstructive sleep apnea.  Stress. What are the signs or symptoms? Extremely high blood pressure (hypertensive crisis) may cause:  Headache.  Anxiety.  Shortness of breath.  Nosebleed.  Nausea and vomiting.  Severe chest  pain.  Jerky movements you cannot control (seizures). How is this diagnosed? This condition is diagnosed by measuring your blood pressure while you are seated, with your arm resting on a surface. The cuff of the blood pressure monitor will be placed directly against the skin of your upper arm at the level of your heart. It should be measured at least twice using the same arm. Certain conditions can cause a difference in blood pressure between your right and left arms. Certain factors can cause blood pressure readings to be lower or higher than normal (elevated) for a short period of time:  When your blood pressure is higher when you are in a health care provider's office than when you are at home, this is called white coat hypertension. Most people with this condition do not need medicines.  When your blood pressure is higher at home than when you are in a health care provider's office, this is called masked hypertension. Most people with this condition may need medicines to control blood pressure. If you have a high blood pressure reading during one visit or you have normal blood pressure with other risk factors:  You may be asked to return on a different day to have your blood pressure checked again.  You may be asked to monitor your blood pressure at home for 1 week or longer. If you are diagnosed with hypertension, you may have other blood or imaging tests to help your health care provider understand your overall risk for other conditions. How is this treated? This condition is treated by making healthy lifestyle changes, such as eating  healthy foods, exercising more, and reducing your alcohol intake. Your health care provider may prescribe medicine if lifestyle changes are not enough to get your blood pressure under control, and if:  Your systolic blood pressure is above 130.  Your diastolic blood pressure is above 80. Your personal target blood pressure may vary depending on your medical  conditions, your age, and other factors. Follow these instructions at home: Eating and drinking   Eat a diet that is high in fiber and potassium, and low in sodium, added sugar, and fat. An example eating plan is called the DASH (Dietary Approaches to Stop Hypertension) diet. To eat this way:  Eat plenty of fresh fruits and vegetables. Try to fill half of your plate at each meal with fruits and vegetables.  Eat whole grains, such as whole wheat pasta, brown rice, or whole grain bread. Fill about one quarter of your plate with whole grains.  Eat or drink low-fat dairy products, such as skim milk or low-fat yogurt.  Avoid fatty cuts of meat, processed or cured meats, and poultry with skin. Fill about one quarter of your plate with lean proteins, such as fish, chicken without skin, beans, eggs, and tofu.  Avoid premade and processed foods. These tend to be higher in sodium, added sugar, and fat.  Reduce your daily sodium intake. Most people with hypertension should eat less than 1,500 mg of sodium a day.  Limit alcohol intake to no more than 1 drink a day for nonpregnant women and 2 drinks a day for men. One drink equals 12 oz of beer, 5 oz of wine, or 1 oz of hard liquor. Lifestyle   Work with your health care provider to maintain a healthy body weight or to lose weight. Ask what an ideal weight is for you.  Get at least 30 minutes of exercise that causes your heart to beat faster (aerobic exercise) most days of the week. Activities may include walking, swimming, or biking.  Include exercise to strengthen your muscles (resistance exercise), such as pilates or lifting weights, as part of your weekly exercise routine. Try to do these types of exercises for 30 minutes at least 3 days a week.  Do not use any products that contain nicotine or tobacco, such as cigarettes and e-cigarettes. If you need help quitting, ask your health care provider.  Monitor your blood pressure at home as told by  your health care provider.  Keep all follow-up visits as told by your health care provider. This is important. Medicines   Take over-the-counter and prescription medicines only as told by your health care provider. Follow directions carefully. Blood pressure medicines must be taken as prescribed.  Do not skip doses of blood pressure medicine. Doing this puts you at risk for problems and can make the medicine less effective.  Ask your health care provider about side effects or reactions to medicines that you should watch for. Contact a health care provider if:  You think you are having a reaction to a medicine you are taking.  You have headaches that keep coming back (recurring).  You feel dizzy.  You have swelling in your ankles.  You have trouble with your vision. Get help right away if:  You develop a severe headache or confusion.  You have unusual weakness or numbness.  You feel faint.  You have severe pain in your chest or abdomen.  You vomit repeatedly.  You have trouble breathing. Summary  Hypertension is when the force of blood  pumping through your arteries is too strong. If this condition is not controlled, it may put you at risk for serious complications.  Your personal target blood pressure may vary depending on your medical conditions, your age, and other factors. For most people, a normal blood pressure is less than 120/80.  Hypertension is treated with lifestyle changes, medicines, or a combination of both. Lifestyle changes include weight loss, eating a healthy, low-sodium diet, exercising more, and limiting alcohol. This information is not intended to replace advice given to you by your health care provider. Make sure you discuss any questions you have with your health care provider. Document Released: 11/02/2005 Document Revised: 09/30/2016 Document Reviewed: 09/30/2016 Elsevier Interactive Patient Education  2017 [employer].

## 2017-01-29 LAB — PROLACTIN: Prolactin: 8.5 ng/mL

## 2017-04-16 ENCOUNTER — Other Ambulatory Visit: Payer: Self-pay | Admitting: Family Medicine

## 2017-04-16 DIAGNOSIS — D352 Benign neoplasm of pituitary gland: Secondary | ICD-10-CM

## 2017-07-05 ENCOUNTER — Other Ambulatory Visit: Payer: Self-pay | Admitting: Family Medicine

## 2017-07-26 ENCOUNTER — Encounter: Payer: Self-pay | Admitting: Family Medicine

## 2017-07-26 ENCOUNTER — Ambulatory Visit (INDEPENDENT_AMBULATORY_CARE_PROVIDER_SITE_OTHER): Payer: Medicare Other | Admitting: Family Medicine

## 2017-07-26 VITALS — BP 124/72 | HR 65 | Temp 98.1°F | Ht 64.0 in | Wt 193.6 lb

## 2017-07-26 DIAGNOSIS — E785 Hyperlipidemia, unspecified: Secondary | ICD-10-CM | POA: Diagnosis not present

## 2017-07-26 DIAGNOSIS — I10 Essential (primary) hypertension: Secondary | ICD-10-CM

## 2017-07-26 DIAGNOSIS — D352 Benign neoplasm of pituitary gland: Secondary | ICD-10-CM

## 2017-07-26 DIAGNOSIS — D369 Benign neoplasm, unspecified site: Secondary | ICD-10-CM

## 2017-07-26 LAB — LIPID PANEL
Cholesterol: 174 mg/dL (ref 0–200)
HDL: 42.4 mg/dL (ref >39.00)
LDL Cholesterol: 105 mg/dL — ABNORMAL HIGH (ref 0–99)
NonHDL: 131.79
Total CHOL/HDL Ratio: 4
Triglycerides: 133 mg/dL (ref 0.0–149.0)
VLDL: 26.6 mg/dL (ref 0.0–40.0)

## 2017-07-26 LAB — CBC WITH DIFFERENTIAL/PLATELET
BASOS PCT: 0.5 % (ref 0.0–3.0)
Basophils Absolute: 0 10*3/uL (ref 0.0–0.1)
EOS PCT: 0.7 % (ref 0.0–5.0)
Eosinophils Absolute: 0 10*3/uL (ref 0.0–0.7)
HCT: 35.9 % — ABNORMAL LOW (ref 36.0–46.0)
HEMOGLOBIN: 11.5 g/dL — AB (ref 12.0–15.0)
LYMPHS ABS: 1.9 10*3/uL (ref 0.7–4.0)
Lymphocytes Relative: 27.3 % (ref 12.0–46.0)
MCHC: 32.1 g/dL (ref 30.0–36.0)
MCV: 80.9 fl (ref 78.0–100.0)
MONO ABS: 0.4 10*3/uL (ref 0.1–1.0)
Monocytes Relative: 5.1 % (ref 3.0–12.0)
NEUTROS PCT: 66.4 % (ref 43.0–77.0)
Neutro Abs: 4.6 10*3/uL (ref 1.4–7.7)
Platelets: 234 10*3/uL (ref 150.0–400.0)
RBC: 4.44 Mil/uL (ref 3.87–5.11)
RDW: 15.1 % (ref 11.5–15.5)
WBC: 7 10*3/uL (ref 4.0–10.5)

## 2017-07-26 LAB — COMPREHENSIVE METABOLIC PANEL
ALK PHOS: 27 U/L — AB (ref 39–117)
ALT: 26 U/L (ref 0–35)
AST: 23 U/L (ref 0–37)
Albumin: 4.5 g/dL (ref 3.5–5.2)
BUN: 24 mg/dL — ABNORMAL HIGH (ref 6–23)
CALCIUM: 10.5 mg/dL (ref 8.4–10.5)
CO2: 29 mEq/L (ref 19–32)
Chloride: 103 mEq/L (ref 96–112)
Creatinine, Ser: 0.93 mg/dL (ref 0.40–1.20)
GFR: 64.76 mL/min (ref >60.00)
Glucose, Bld: 112 mg/dL — ABNORMAL HIGH (ref 70–99)
POTASSIUM: 3.7 meq/L (ref 3.5–5.1)
Sodium: 142 mEq/L (ref 135–145)
TOTAL PROTEIN: 7.1 g/dL (ref 6.0–8.3)
Total Bilirubin: 0.3 mg/dL (ref 0.2–1.2)

## 2017-07-26 NOTE — Progress Notes (Signed)
Patient ID: Kristin Delgado, female    DOB: 1956/08/22  Age: 61 y.o. MRN: [id number]    Subjective:  Subjective  HPI Kristin Delgado presents for f/u bp and cholesterol.  No complaints.   Review of Systems  Constitutional: Negative for activity change, appetite change, chills, fatigue, fever and unexpected weight change.  HENT: Negative for congestion and hearing loss.   Eyes: Negative for discharge.  Respiratory: Negative for cough and shortness of breath.   Cardiovascular: Negative for chest pain, palpitations and leg swelling.  Gastrointestinal: Negative for abdominal pain, blood in stool, constipation, diarrhea, nausea and vomiting.  Genitourinary: Negative for dysuria, frequency, hematuria and urgency.  Musculoskeletal: Negative for back pain and myalgias.  Skin: Negative for rash.  Allergic/Immunologic: Negative for environmental allergies.  Neurological: Negative for dizziness, weakness and headaches.  Hematological: Does not bruise/bleed easily.  Psychiatric/Behavioral: Negative for behavioral problems, dysphoric mood and suicidal ideas. The patient is not nervous/anxious.     History Past Medical History:  Diagnosis Date  . Allergy    seasonal  . Anxiety    closed space like MRI  . Chest pain 2006   admitted, essentially negative  . GERD (gastroesophageal reflux disease)   . Hyperlipidemia   . Hypertension   . Pituitary microadenoma [facility])     She has a past surgical history that includes acoustic neuroma (07/03/2011); Brain surgery (1 / 1985); and Cochlear implant (02/2013).   Her family history includes Alzheimer's disease in her father; Cancer (age of onset: 78) in her mother; Dementia in her father; Lung cancer in her mother.She reports that she has never smoked. She has never used smokeless tobacco. She reports that she does not drink alcohol or use drugs.  Current Outpatient Prescriptions on File Prior to Visit  Medication Sig Dispense Refill  . acetaminophen  (TYLENOL ARTHRITIS PAIN) 650 MG CR tablet Take 650 mg by mouth every 8 (eight) hours as needed. pain     . Ascorbic Acid (VITAMIN C) 1000 MG tablet Take 1,000 mg by mouth 2 (two) times daily.      [name] atorvastatin (LIPITOR) 20 MG tablet TAKE 1 TABLET DAILY 90 tablet 1  . bromocriptine (PARLODEL) 2.5 MG tablet TAKE 1 TABLET DAILY 90 tablet 0  . famotidine (PEPCID AC) 10 MG chewable tablet Chew 10 mg by mouth daily.      . fenofibrate 160 MG tablet TAKE 1 TABLET DAILY 90 tablet 1  . hydrochlorothiazide (HYDRODIURIL) 25 MG tablet TAKE 1 TABLET DAILY 90 tablet 1  . lisinopril (PRINIVIL,ZESTRIL) 10 MG tablet Take 0.5 tablets (5 mg total) by mouth daily. 45 tablet 1  . Multiple Vitamin (MULTIVITAMIN) tablet Take 1 tablet by mouth daily.      . Omega-3 Fatty Acids (FISH OIL) 1200 MG CAPS Take 1 capsule by mouth 2 (two) times daily.    . Potassium Gluconate (CVS POTASSIUM GLUCONATE) 2 MEQ TABS Take 1 tablet by mouth daily.     No current facility-administered medications on file prior to visit.      Objective:  Objective  Physical Exam  Constitutional: She is oriented to person, place, and time. She appears well-developed and well-nourished. No distress.  HENT:  Head: Normocephalic and atraumatic.  Right Ear: External ear normal.  Left Ear: External ear normal.  Nose: Nose normal.  Mouth/Throat: Oropharynx is clear and moist.  Eyes: Pupils are equal, round, and reactive to light. Conjunctivae and EOM are normal. Right eye exhibits no discharge. Left eye exhibits no discharge.  Neck:  Normal range of motion. Neck supple. No JVD present. No thyromegaly present.  Cardiovascular: Normal rate, regular rhythm and intact distal pulses.   Murmur heard. Pulmonary/Chest: Effort normal and breath sounds normal. No respiratory distress. She has no wheezes. She has no rales. She exhibits no tenderness.  Abdominal: Soft. Bowel sounds are normal. She exhibits no distension and no mass. There is no tenderness.  There is no rebound and no guarding.  Musculoskeletal: Normal range of motion. She exhibits no edema or tenderness.  Lymphadenopathy:    She has no cervical adenopathy.  Neurological: She is alert and oriented to person, place, and time. She has normal reflexes. No cranial nerve deficit.  Skin: Skin is warm and dry. No rash noted. She is not diaphoretic. No erythema.  Psychiatric: She has a normal mood and affect. Her behavior is normal. Judgment and thought content normal.  Nursing note and vitals reviewed.  BP 124/72 (BP Location: Right Arm, Patient Position: Sitting, Cuff Size: Normal)   Pulse 65   Temp 98.1 F (36.7 C) (Oral)   Ht 5\' 4"  (1.626 m)   Wt 193 lb 9.6 oz (87.8 kg)   SpO2 98%   BMI 33.23 kg/m  Wt Readings from Last 3 Encounters:  07/26/17 193 lb 9.6 oz (87.8 kg)  01/28/17 202 lb 6.4 oz (91.8 kg)  07/30/16 209 lb 14.4 oz (95.2 kg)     Lab Results  Component Value Date   WBC 6.3 07/30/2016   HGB 12.3 07/30/2016   HCT 37.0 07/30/2016   PLT 233.0 07/30/2016   GLUCOSE 117 (H) 01/28/2017   CHOL 160 01/28/2017   TRIG 206.0 (H) 01/28/2017   HDL 37.10 (L) 01/28/2017   LDLDIRECT 83.0 01/28/2017   LDLCALC 107 (H) 01/27/2016   ALT 34 01/28/2017   AST 29 01/28/2017   NA 140 01/28/2017   K 3.3 (L) 01/28/2017   CL 103 01/28/2017   CREATININE 0.88 01/28/2017   BUN 19 01/28/2017   CO2 27 01/28/2017   TSH 2.58 04/06/2016   HGBA1C 6.1 01/28/2017    No results found.   Assessment & Plan:  Plan  I am having Ms. Halling maintain her famotidine, multivitamin, vitamin C, acetaminophen, Fish Oil, Potassium Gluconate, lisinopril, bromocriptine, atorvastatin, hydrochlorothiazide, and fenofibrate.  No orders of the defined types were placed in this encounter.   Problem List Items Addressed This Visit      Unprioritized   Essential hypertension    Well controlled, no changes to meds. Encouraged heart healthy diet such as the DASH diet and exercise as tolerated.         Relevant Orders   Comprehensive metabolic panel   Lipid panel   CBC with Differential/Platelet   Hyperlipidemia LDL goal <100    Tolerating statin, encouraged heart healthy diet, avoid trans fats, minimize simple carbs and saturated fats. Increase exercise as tolerated      Relevant Orders   Comprehensive metabolic panel   Lipid panel   CBC with Differential/Platelet    Other Visit Diagnoses    Microadenoma ([place])    -  Primary   Relevant Orders   Prolactin      Follow-up: Return in about 6 months (around 01/23/2018) for hypertension, hyperlipidemia.  [name], DO

## 2017-07-26 NOTE — Assessment & Plan Note (Signed)
Tolerating statin, encouraged heart healthy diet, avoid trans fats, minimize simple carbs and saturated fats. Increase exercise as tolerated 

## 2017-07-26 NOTE — Patient Instructions (Signed)

## 2017-07-26 NOTE — Assessment & Plan Note (Signed)
Well controlled, no changes to meds. Encouraged heart healthy diet such as the DASH diet and exercise as tolerated.  °

## 2017-07-27 LAB — PROLACTIN: PROLACTIN: 8.7 ng/mL

## 2017-07-28 ENCOUNTER — Telehealth: Payer: Self-pay

## 2017-07-28 DIAGNOSIS — E785 Hyperlipidemia, unspecified: Secondary | ICD-10-CM

## 2017-07-28 DIAGNOSIS — I10 Essential (primary) hypertension: Secondary | ICD-10-CM

## 2017-07-28 DIAGNOSIS — D649 Anemia, unspecified: Secondary | ICD-10-CM

## 2017-07-28 NOTE — Telephone Encounter (Signed)
Patient aware of lab results/agrees to take MTV w Fe and recheck fasting labs in 6 months/she states that she will call back to schedule visit for March and will have it done then/created future order for CMP;CBC w/Kamila Broda; Lipid/thx dmf

## 2017-07-28 NOTE — Telephone Encounter (Signed)
-----   Message from [name], [place] sent at 07/28/2017 11:21 AM EDT ----- Doristine Devoid!  Your are slightly anemic--- take multivitamin with iron daily  Recheck 6 months Lipid, cmp, cbcd

## 2017-09-21 ENCOUNTER — Other Ambulatory Visit: Payer: Self-pay | Admitting: Family Medicine

## 2017-09-21 MED ORDER — LISINOPRIL 10 MG PO TABS: 5.0000 mg | ORAL_TABLET | Freq: Every day | ORAL | 1 refills | Status: DC

## 2017-09-21 NOTE — Telephone Encounter (Signed)
Caller name: Relation to TM:AUQJ Call back number: [phone] Pharmacy: [facility] mail [facility]  Reason for call: pt is needing refills on lisinopril (PRINIVIL,ZESTRIL) 10 MG tablet, pt is out of meds, and states that [facility] that she is not due for a refill until feb 2019, pt not sure what is going on she states she has tried refilling it 3 times and unable to .

## 2017-09-21 NOTE — Telephone Encounter (Signed)
[facility] notified [facility]

## 2017-09-29 MED ORDER — LISINOPRIL 10 MG PO TABS: 5.0000 mg | ORAL_TABLET | Freq: Every day | ORAL | 0 refills | Status: DC

## 2017-09-29 MED ORDER — LISINOPRIL 10 MG PO TABS: 5.0000 mg | ORAL_TABLET | Freq: Every day | ORAL | 1 refills | Status: DC

## 2017-09-29 NOTE — Telephone Encounter (Signed)
[facility] Also resent to [facility] as well. Patient notified.

## 2017-09-29 NOTE — Addendum Note (Signed)
Addended by: Sharon Seller B on: 09/29/2017 04:21 PM   Modules accepted: Orders

## 2017-09-29 NOTE — Telephone Encounter (Addendum)
Relation to pt: self  Call back [phone]   Reason for call:  Patient states lisinopril (PRINIVIL,ZESTRIL) 10 MG tablet was suppose to be sent to   [id number] [place], [place] [phone] (Phone) [phone] (Fax)   Patient states medication has not been received from mail order and would like Rx sent to retail today please  [id number] [place] [place]

## 2017-10-23 ENCOUNTER — Other Ambulatory Visit: Payer: Self-pay | Admitting: Family Medicine

## 2017-10-23 DIAGNOSIS — D352 Benign neoplasm of pituitary gland: Secondary | ICD-10-CM

## 2017-11-26 ENCOUNTER — Other Ambulatory Visit: Payer: Self-pay | Admitting: Family Medicine

## 2017-11-26 DIAGNOSIS — D352 Benign neoplasm of pituitary gland: Secondary | ICD-10-CM

## 2017-12-23 DIAGNOSIS — H903 Sensorineural hearing loss, bilateral: Secondary | ICD-10-CM | POA: Diagnosis not present

## 2017-12-26 ENCOUNTER — Other Ambulatory Visit: Payer: Self-pay | Admitting: Family Medicine

## 2018-01-20 NOTE — Progress Notes (Signed)
Subjective:   Kristin Delgado is a 62 y.o. female who presents for Medicare Annual (Subsequent) preventive examination. Works at [employer] 30hrs/ week.  Review of Systems: No ROS.  Medicare Wellness Visit. Additional risk factors are reflected in the social history. Cardiac Risk Factors include: advanced age (>60men, >72 women);dyslipidemia;hypertension;obesity (BMI >30kg/m2) Sleep patterns: Wakes twice to urinate. Only sleeps about 5 hrs per night. Naps occasionally.     Home Safety/Smoke Alarms: Feels safe in home. Smoke alarms in place.  Living environment; residence and Firearm Safety: 1 story home. Lives with husband and 2 teen kids. Seat Belt Safety/Bike Helmet: Wears seat belt.   Female:   Pap- last 01/27/16-normal      Mammo-  declines     Dexa scan-  declines     CCS- 08/12/11. Normal. Recall 10 yrs. Dr.Jacobs. Eye- yearly.     Objective:     Vitals: BP 122/74 (BP Location: Left Arm, Patient Position: Sitting, Cuff Size: Normal)   Pulse 75   Ht 5\' 4"  (1.626 m)   Wt 193 lb 3.2 oz (87.6 kg)   SpO2 96%   BMI 33.16 kg/m   Body mass index is 33.16 kg/m.  Advanced Directives 01/24/2018  Does Patient Have a Medical Advance Directive? No  Would patient like information on creating a medical advance directive? Yes (MAU/Ambulatory/Procedural Areas - Information given)    Tobacco Social History   Tobacco Use  Smoking Status Never Smoker  Smokeless Tobacco Never Used     Counseling given: Not Answered   Clinical Intake: Pain : No/denies pain     Past Medical History:  Diagnosis Date  . Allergy    seasonal  . Anxiety    closed space like MRI  . Chest pain 2006   admitted, essentially negative  . GERD (gastroesophageal reflux disease)   . Hyperlipidemia   . Hypertension   . 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)    Past Surgical History:  Procedure Laterality Date  . 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 BRAIN SURGERY  1 / 1985   microadenoma  . COCHLEAR IMPLANT   02/2013   Left side   Family History  Problem Relation Age of Onset  . Lung cancer Mother   . Cancer Mother 52       lung   . Alzheimer's disease Father   . Dementia Father    Social History   Socioeconomic History  . Marital status: Married    Spouse name: None  . Number of children: 3  . Years of education: None  . Highest education level: None  Social Needs  . Financial resource strain: None  . Food insecurity - worry: None  . Food insecurity - inability: None  . Transportation needs - medical: None  . Transportation needs - non-medical: None  Occupational History  . Occupation: stay at home mom  Tobacco Use  . Smoking status: Never Smoker  . Smokeless tobacco: Never Used  Substance and Sexual Activity  . Alcohol use: No    Comment: rare  . Drug use: No  . Sexual activity: Not Currently    Partners: Male  Other Topics Concern  . None  Social History Narrative   Exercise-- daily --- walking, 3x a week DVD    Outpatient Encounter Medications as of 01/24/2018  Medication Sig  . acetaminophen (TYLENOL ARTHRITIS PAIN) 650 MG CR tablet Take 650 mg by mouth every 8 (eight) hours as needed. pain   . Ascorbic Acid (VITAMIN C)  1000 MG tablet Take 1,000 mg by mouth 2 (two) times daily.    [name] atorvastatin (LIPITOR) 20 MG tablet TAKE 1 TABLET DAILY  . bromocriptine (PARLODEL) 2.5 MG tablet TAKE 1 TABLET DAILY  . Calcium Polycarbophil (FIBER-CAPS PO) Take by mouth.  [name] CALCIUM-MAGNESIUM-ZINC PO Take by mouth.  . Cholecalciferol ([employer] D3) 5000 units capsule Take 5,000 Units by mouth daily.  . famotidine (PEPCID AC) 10 MG chewable tablet Chew 10 mg by mouth daily.    . fenofibrate 160 MG tablet TAKE 1 TABLET DAILY  . hydrochlorothiazide (HYDRODIURIL) 25 MG tablet TAKE 1 TABLET DAILY  . lisinopril (PRINIVIL,ZESTRIL) 10 MG tablet Take 0.5 tablets (5 mg total) daily by mouth.  . Multiple Vitamin (MULTIVITAMIN) tablet Take 1 tablet by mouth daily.    . Omega 3-6-9 Fatty Acids (OMEGA  3-6-9 COMPLEX PO) Take by mouth.  . Omega-3 Fatty Acids (FISH OIL) 1200 MG CAPS Take 1 capsule by mouth 2 (two) times daily.  . Probiotic Product (PROBIOTIC ADVANCED PO) Take by mouth.  . [place] WORT PO Take by mouth.  . Potassium Gluconate ([employer]S POTASSIUM GLUCONATE) 2 MEQ TABS Take 1 tablet by mouth daily.  . [DISCONTINUED] lisinopril (PRINIVIL,ZESTRIL) 10 MG tablet TAKE 0.5 TABLETS (5 MG TOTAL) DAILY BY MOUTH.   No facility-administered encounter medications on file as of 01/24/2018.     Activities of Daily Living In your present state of health, do you have any difficulty performing the following activities: 01/24/2018  Hearing? 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 N  Comment wears glasses. eye doctor yearly.   Difficulty concentrating or making decisions? N  Walking or climbing stairs? N  Dressing or bathing? N  Doing errands, shopping? N  Preparing Food and eating ? N  Using the Toilet? N  In the past six months, have you accidently leaked urine? N  Comment at night only. wears pads.   Do you have problems with loss of bowel control? N  Managing your Medications? N  Managing your Finances? N  Housekeeping or managing your Housekeeping? N  Some recent data might be hidden    Patient Care Team: Carollee Herter, Alferd Apa, DO as PCP - General Poudyal, Ritesh, OD (Optometry) Sanjuana Kava, MD as Referring Physician (Otolaryngology) [facility] as Consulting Physician    Assessment:   This is a routine wellness examination for Kristin Delgado. Physical assessment deferred to PCP.  Exercise Activities and Dietary recommendations Current Exercise Habits: The patient does not participate in regular exercise at present(still works 30 hrs per week), Exercise limited by: None identified Diet (meal preparation, eat out, water intake, caffeinated beverages, dairy products, fruits and vegetables): well balanced, on average, 2  meals per day   Goals    . Weight (lb) < 175 lb (79.4 kg) (pt-stated)     Exercise and drink more water.       Fall Risk Fall Risk  01/24/2018  Falls in the past year? No    Depression Screen PHQ 2/9 Scores 01/24/2018 07/14/2013  PHQ - 2 Score 0 0     Cognitive Function MMSE - Mini Mental State Exam 01/24/2018  Orientation to time 5  Orientation to Place 5  Registration 3  Attention/ Calculation 5  Recall 3  Language- name 2 objects 2  Language- repeat 1  Language- follow 3 step command 3  Language- read & follow direction 1  Write a sentence 1  Copy  design 1  Total score 30        Immunization History  Administered Date(s) Administered  . Tdap 06/10/2011  . Zoster 07/25/2015    Screening Tests Health Maintenance  Topic Date Due  . HIV Screening  10[birth year]/1969  . MAMMOGRAM  08/23/2015  . INFLUENZA VACCINE  09/26/2018 (Originally 06/16/2017)  . PAP SMEAR  01/27/2019  . TETANUS/TDAP  06/09/2021  . COLONOSCOPY  08/11/2021  . Hepatitis C Screening  Completed     Plan:   Follow up with Dr.Lowne today as scheduled.  Continue to eat heart healthy diet (full of fruits, vegetables, whole grains, lean protein, water--limit salt, fat, and sugar intake) and increase physical activity as tolerated.  Continue doing brain stimulating activities (puzzles, reading, adult coloring books, staying active) to keep memory sharp.   Bring a copy of your living will and/or healthcare power of attorney to your next office visit.   I have personally reviewed and noted the following in the patient's chart:   . Medical and social history . Use of alcohol, tobacco or illicit drugs  . Current medications and supplements . Functional ability and status . Nutritional status . Physical activity . Advanced directives . List of other physicians . Hospitalizations, surgeries, and ER visits in previous 12 months . Vitals . Screenings to include cognitive, depression, and  falls . Referrals and appointments  In addition, I have reviewed and discussed with patient certain preventive protocols, quality metrics, and best practice recommendations. A written personalized care plan for preventive services as well as general preventive health recommendations were provided to patient.     Shela Nevin, [place]  01/24/2018

## 2018-01-24 ENCOUNTER — Ambulatory Visit (INDEPENDENT_AMBULATORY_CARE_PROVIDER_SITE_OTHER): Payer: Medicare Other | Admitting: *Deleted

## 2018-01-24 ENCOUNTER — Encounter: Payer: Self-pay | Admitting: *Deleted

## 2018-01-24 ENCOUNTER — Ambulatory Visit (INDEPENDENT_AMBULATORY_CARE_PROVIDER_SITE_OTHER): Payer: Medicare Other | Admitting: Family Medicine

## 2018-01-24 ENCOUNTER — Encounter: Payer: Self-pay | Admitting: Family Medicine

## 2018-01-24 VITALS — BP 122/74 | HR 75 | Ht 65.0 in | Wt 193.0 lb

## 2018-01-24 VITALS — BP 122/74 | HR 75 | Ht 64.0 in | Wt 193.2 lb

## 2018-01-24 DIAGNOSIS — I10 Essential (primary) hypertension: Secondary | ICD-10-CM | POA: Diagnosis not present

## 2018-01-24 DIAGNOSIS — Z0000 Encounter for general adult medical examination without abnormal findings: Secondary | ICD-10-CM | POA: Diagnosis not present

## 2018-01-24 DIAGNOSIS — D369 Benign neoplasm, unspecified site: Secondary | ICD-10-CM

## 2018-01-24 DIAGNOSIS — D352 Benign neoplasm of pituitary gland: Secondary | ICD-10-CM

## 2018-01-24 DIAGNOSIS — E785 Hyperlipidemia, unspecified: Secondary | ICD-10-CM | POA: Diagnosis not present

## 2018-01-24 LAB — LIPID PANEL
CHOLESTEROL: 160 mg/dL (ref 0–200)
HDL: 51.7 mg/dL (ref >39.00)
LDL Cholesterol: 93 mg/dL (ref 0–99)
NONHDL: 108.14
Total CHOL/HDL Ratio: 3
Triglycerides: 77 mg/dL (ref 0.0–149.0)
VLDL: 15.4 mg/dL (ref 0.0–40.0)

## 2018-01-24 LAB — COMPREHENSIVE METABOLIC PANEL
ALBUMIN: 4.4 g/dL (ref 3.5–5.2)
ALK PHOS: 30 U/L — AB (ref 39–117)
ALT: 22 U/L (ref 0–35)
AST: 20 U/L (ref 0–37)
BUN: 21 mg/dL (ref 6–23)
CO2: 32 mEq/L (ref 19–32)
CREATININE: 0.85 mg/dL (ref 0.40–1.20)
Calcium: 10.6 mg/dL — ABNORMAL HIGH (ref 8.4–10.5)
Chloride: 102 mEq/L (ref 96–112)
GFR: 71.73 mL/min (ref >60.00)
GLUCOSE: 100 mg/dL — AB (ref 70–99)
POTASSIUM: 3.8 meq/L (ref 3.5–5.1)
SODIUM: 139 meq/L (ref 135–145)
TOTAL PROTEIN: 7.1 g/dL (ref 6.0–8.3)
Total Bilirubin: 0.3 mg/dL (ref 0.2–1.2)

## 2018-01-24 MED ORDER — LISINOPRIL 10 MG PO TABS: 5.0000 mg | ORAL_TABLET | Freq: Every day | ORAL | 1 refills | Status: DC

## 2018-01-24 NOTE — Patient Instructions (Signed)

## 2018-01-24 NOTE — Assessment & Plan Note (Signed)
Tolerating statin, encouraged heart healthy diet, avoid trans fats, minimize simple carbs and saturated fats. Increase exercise as tolerated 

## 2018-01-24 NOTE — Patient Instructions (Signed)
Continue to eat heart healthy diet (full of fruits, vegetables, whole grains, lean protein, water--limit salt, fat, and sugar intake) and increase physical activity as tolerated.  Continue doing brain stimulating activities (puzzles, reading, adult coloring books, staying active) to keep memory sharp.   Bring a copy of your living will and/or healthcare power of attorney to your next office visit.   Kristin Delgado , Thank you for taking time to come for your Medicare Wellness Visit. I appreciate your ongoing commitment to your health goals. Please review the following plan we discussed and let me know if I can assist you in the future.   These are the goals we discussed: Goals    . Weight (lb) < 175 lb (79.4 kg) (pt-stated)     Exercise and drink more water.       This is a list of the screening recommended for you and due dates:  Health Maintenance  Topic Date Due  . HIV Screening  10/14/1971  . Flu Shot  09/26/2018*  . Mammogram  01/25/2019*  . Pap Smear  01/27/2019  . Tetanus Vaccine  06/09/2021  . Colon Cancer Screening  08/11/2021  .  Hepatitis C: One time screening is recommended by Center for Disease Control  (CDC) for  adults born from [birth year] through 1967.   Completed  *Topic was postponed. The date shown is not the original due date.    Health Maintenance for Postmenopausal Women Menopause is a normal process in which your reproductive ability comes to an end. This process happens gradually over a span of months to years, usually between the ages of 20 and 49. Menopause is complete when you have missed 12 consecutive menstrual periods. It is important to talk with your health care provider about some of the most common conditions that affect postmenopausal women, such as heart disease, cancer, and bone loss (osteoporosis). Adopting a healthy lifestyle and getting preventive care can help to promote your health and wellness. Those actions can also lower your chances of developing some  of these common conditions. What should I know about menopause? During menopause, you may experience a number of symptoms, such as:  Moderate-to-severe hot flashes.  Night sweats.  Decrease in sex drive.  Mood swings.  Headaches.  Tiredness.  Irritability.  Memory problems.  Insomnia.  Choosing to treat or not to treat menopausal changes is an individual decision that you make with your health care provider. What should I know about hormone replacement therapy and supplements? Hormone therapy products are effective for treating symptoms that are associated with menopause, such as hot flashes and night sweats. Hormone replacement carries certain risks, especially as you become older. If you are thinking about using estrogen or estrogen with progestin treatments, discuss the benefits and risks with your health care provider. What should I know about heart disease and stroke? Heart disease, heart attack, and stroke become more likely as you age. This may be due, in part, to the hormonal changes that your body experiences during menopause. These can affect how your body processes dietary fats, triglycerides, and cholesterol. Heart attack and stroke are both medical emergencies. There are many things that you can do to help prevent heart disease and stroke:  Have your blood pressure checked at least every 1-2 years. High blood pressure causes heart disease and increases the risk of stroke.  If you are 19-18 years old, ask your health care provider if you should take aspirin to prevent a heart attack or a stroke.  Do not use any tobacco products, including cigarettes, chewing tobacco, or electronic cigarettes. If you need help quitting, ask your health care provider.  It is important to eat a healthy diet and maintain a healthy weight. ? Be sure to include plenty of vegetables, fruits, low-fat dairy products, and lean protein. ? Avoid eating foods that are high in solid fats, added  sugars, or salt (sodium).  Get regular exercise. This is one of the most important things that you can do for your health. ? Try to exercise for at least 150 minutes each week. The type of exercise that you do should increase your heart rate and make you sweat. This is known as moderate-intensity exercise. ? Try to do strengthening exercises at least twice each week. Do these in addition to the moderate-intensity exercise.  Know your numbers.Ask your health care provider to check your cholesterol and your blood glucose. Continue to have your blood tested as directed by your health care provider.  What should I know about cancer screening? There are several types of cancer. Take the following steps to reduce your risk and to catch any cancer development as early as possible. Breast Cancer  Practice breast self-awareness. ? This means understanding how your breasts normally appear and feel. ? It also means doing regular breast self-exams. Let your health care provider know about any changes, no matter how small.  If you are 47 or older, have a clinician do a breast exam (clinical breast exam or CBE) every year. Depending on your age, family history, and medical history, it may be recommended that you also have a yearly breast X-ray (mammogram).  If you have a family history of breast cancer, talk with your health care provider about genetic screening.  If you are at high risk for breast cancer, talk with your health care provider about having an MRI and a mammogram every year.  Breast cancer (BRCA) gene test is recommended for women who have family members with BRCA-related cancers. Results of the assessment will determine the need for genetic counseling and BRCA1 and for BRCA2 testing. BRCA-related cancers include these types: ? Breast. This occurs in males or females. ? Ovarian. ? Tubal. This may also be called fallopian tube cancer. ? Cancer of the abdominal or pelvic lining (peritoneal  cancer). ? Prostate. ? Pancreatic.  Cervical, Uterine, and Ovarian Cancer Your health care provider may recommend that you be screened regularly for cancer of the pelvic organs. These include your ovaries, uterus, and vagina. This screening involves a pelvic exam, which includes checking for microscopic changes to the surface of your cervix (Pap test).  For women ages 21-65, health care providers may recommend a pelvic exam and a Pap test every three years. For women ages 61-65, they may recommend the Pap test and pelvic exam, combined with testing for human papilloma virus (HPV), every five years. Some types of HPV increase your risk of cervical cancer. Testing for HPV may also be done on women of any age who have unclear Pap test results.  Other health care providers may not recommend any screening for nonpregnant women who are considered low risk for pelvic cancer and have no symptoms. Ask your health care provider if a screening pelvic exam is right for you.  If you have had past treatment for cervical cancer or a condition that could lead to cancer, you need Pap tests and screening for cancer for at least 20 years after your treatment. If Pap tests have been discontinued  for you, your risk factors (such as having a new sexual partner) need to be reassessed to determine if you should start having screenings again. Some women have medical problems that increase the chance of getting cervical cancer. In these cases, your health care provider may recommend that you have screening and Pap tests more often.  If you have a family history of uterine cancer or ovarian cancer, talk with your health care provider about genetic screening.  If you have vaginal bleeding after reaching menopause, tell your health care provider.  There are currently no reliable tests available to screen for ovarian cancer.  Lung Cancer Lung cancer screening is recommended for adults 44-73 years old who are at high risk for  lung cancer because of a history of smoking. A yearly low-dose CT scan of the lungs is recommended if you:  Currently smoke.  Have a history of at least 30 pack-years of smoking and you currently smoke or have quit within the past 15 years. A pack-year is smoking an average of one pack of cigarettes per day for one year.  Yearly screening should:  Continue until it has been 15 years since you quit.  Stop if you develop a health problem that would prevent you from having lung cancer treatment.  Colorectal Cancer  This type of cancer can be detected and can often be prevented.  Routine colorectal cancer screening usually begins at age 60 and continues through age 77.  If you have risk factors for colon cancer, your health care provider may recommend that you be screened at an earlier age.  If you have a family history of colorectal cancer, talk with your health care provider about genetic screening.  Your health care provider may also recommend using home test kits to check for hidden blood in your stool.  A small camera at the end of a tube can be used to examine your colon directly (sigmoidoscopy or colonoscopy). This is done to check for the earliest forms of colorectal cancer.  Direct examination of the colon should be repeated every 5-10 years until age 3. However, if early forms of precancerous polyps or small growths are found or if you have a family history or genetic risk for colorectal cancer, you may need to be screened more often.  Skin Cancer  Check your skin from head to toe regularly.  Monitor any moles. Be sure to tell your health care provider: ? About any new moles or changes in moles, especially if there is a change in a mole's shape or color. ? If you have a mole that is larger than the size of a pencil eraser.  If any of your family members has a history of skin cancer, especially at a young age, talk with your health care provider about genetic  screening.  Always use sunscreen. Apply sunscreen liberally and repeatedly throughout the day.  Whenever you are outside, protect yourself by wearing long sleeves, pants, a wide-brimmed hat, and sunglasses.  What should I know about osteoporosis? Osteoporosis is a condition in which bone destruction happens more quickly than new bone creation. After menopause, you may be at an increased risk for osteoporosis. To help prevent osteoporosis or the bone fractures that can happen because of osteoporosis, the following is recommended:  If you are 71-70 years old, get at least 1,000 mg of calcium and at least 600 mg of vitamin D per day.  If you are older than age 6 but younger than age 24, get  at least 1,200 mg of calcium and at least 600 mg of vitamin D per day.  If you are older than age 70, get at least 1,200 mg of calcium and at least 800 mg of vitamin D per day.  Smoking and excessive alcohol intake increase the risk of osteoporosis. Eat foods that are rich in calcium and vitamin D, and do weight-bearing exercises several times each week as directed by your health care provider. What should I know about how menopause affects my mental health? Depression may occur at any age, but it is more common as you become older. Common symptoms of depression include:  Low or sad mood.  Changes in sleep patterns.  Changes in appetite or eating patterns.  Feeling an overall lack of motivation or enjoyment of activities that you previously enjoyed.  Frequent crying spells.  Talk with your health care provider if you think that you are experiencing depression. What should I know about immunizations? It is important that you get and maintain your immunizations. These include:  Tetanus, diphtheria, and pertussis (Tdap) booster vaccine.  Influenza every year before the flu season begins.  Pneumonia vaccine.  Shingles vaccine.  Your health care provider may also recommend other  immunizations. This information is not intended to replace advice given to you by your health care provider. Make sure you discuss any questions you have with your health care provider. Document Released: 12/25/2005 Document Revised: 05/22/2016 Document Reviewed: 08/06/2015 [employer] Interactive Patient Education  2018 [employer]  

## 2018-01-24 NOTE — Progress Notes (Signed)
Patient ID: Kristin Delgado, female    DOB: 28-Dec-1955  Age: 62 y.o. MRN: [id number]    Subjective:  Subjective  HPI Kristin Delgado presents for f/u bp and cholesterol   We will check her prolactin as well.  No complaints.   Review of Systems  Constitutional: Negative for activity change, appetite change, fatigue and unexpected weight change.  Respiratory: Negative for cough and shortness of breath.   Cardiovascular: Negative for chest pain and palpitations.  Psychiatric/Behavioral: Negative for behavioral problems and dysphoric mood. The patient is not nervous/anxious.     History Past Medical History:  Diagnosis Date  . Allergy    seasonal  . Anxiety    closed space like MRI  . Chest pain 2006   admitted, essentially negative  . GERD (gastroesophageal reflux disease)   . Hyperlipidemia   . Hypertension   . 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   Her family history includes Alzheimer's disease in her father; Cancer (age of onset: 16) in her mother; Dementia in her father; Lung cancer in her mother.She reports that  has never smoked. she has never used smokeless tobacco. She reports that she does not drink alcohol or use drugs.  Current Outpatient Medications on File Prior to Visit  Medication Sig Dispense Refill  . acetaminophen (TYLENOL ARTHRITIS PAIN) 650 MG CR tablet Take 650 mg by mouth every 8 (eight) hours as needed. pain     . Ascorbic Acid (VITAMIN C) 1000 MG tablet Take 1,000 mg by mouth 2 (two) times daily.      [name] atorvastatin (LIPITOR) 20 MG tablet TAKE 1 TABLET DAILY 90 tablet 1  . bromocriptine (PARLODEL) 2.5 MG tablet TAKE 1 TABLET DAILY 90 tablet 0  . famotidine (PEPCID AC) 10 MG chewable tablet Chew 10 mg by mouth daily.      . fenofibrate 160 MG tablet TAKE 1 TABLET DAILY 90 tablet 1  . hydrochlorothiazide (HYDRODIURIL) 25 MG tablet  TAKE 1 TABLET DAILY 90 tablet 1  . Multiple Vitamin (MULTIVITAMIN) tablet Take 1 tablet by mouth daily.      . Omega-3 Fatty Acids (FISH OIL) 1200 MG CAPS Take 1 capsule by mouth 2 (two) times daily.    . Potassium Gluconate (CVS POTASSIUM GLUCONATE) 2 MEQ TABS Take 1 tablet by mouth daily.     No current facility-administered medications on file prior to visit.      Objective:  Objective  Physical Exam  Constitutional: She is oriented to person, place, and time. She appears well-developed and well-nourished.  HENT:  Head: Normocephalic and atraumatic.  Eyes: Conjunctivae and EOM are normal.  Neck: Normal range of motion. Neck supple. No JVD present. Carotid bruit is not present. No thyromegaly present.  Cardiovascular: Normal rate, regular rhythm and normal heart sounds.  No murmur heard. Pulmonary/Chest: Effort normal and breath sounds normal. No respiratory distress. She has no wheezes. She has no rales. She exhibits no tenderness.  Musculoskeletal: She exhibits no edema.  Neurological: She is alert and oriented to person, place, and time.  Psychiatric: She has a normal mood and affect.  Nursing note and vitals reviewed.  BP 122/74   Pulse 75   Ht 5\' 5"  (1.651 m)   Wt 193 lb (87.5 kg)   SpO2 96%   BMI 32.12 kg/m  Wt Readings from Last 3 Encounters:  01/24/18 193 lb (87.5 kg)  01/24/18 193 lb 3.2 oz (87.6 kg)  07/26/17 193 lb 9.6 oz (87.8 kg)     Lab Results  Component Value Date   WBC 7.0 07/26/2017   HGB 11.5 (L) 07/26/2017   HCT 35.9 (L) 07/26/2017   PLT 234.0 07/26/2017   GLUCOSE 112 (H) 07/26/2017   CHOL 174 07/26/2017   TRIG 133.0 07/26/2017   HDL 42.40 07/26/2017   LDLDIRECT 83.0 01/28/2017   LDLCALC 105 (H) 07/26/2017   ALT 26 07/26/2017   AST 23 07/26/2017   NA 142 07/26/2017   K 3.7 07/26/2017   CL 103 07/26/2017   CREATININE 0.93 07/26/2017   BUN 24 (H) 07/26/2017   CO2 29 07/26/2017   TSH 2.58 04/06/2016   HGBA1C 6.1 01/28/2017    No results  found.   Assessment & Plan:  Plan  I have changed [name]'s lisinopril. I am also having her maintain her famotidine, multivitamin, vitamin C, acetaminophen, Fish Oil, Potassium Gluconate, atorvastatin, fenofibrate, hydrochlorothiazide, and bromocriptine.  Meds ordered this encounter  Medications  . lisinopril (PRINIVIL,ZESTRIL) 10 MG tablet    Sig: Take 0.5 tablets (5 mg total) by mouth daily.    Dispense:  45 tablet    Refill:  1    Problem List Items Addressed This Visit      Unprioritized   Essential hypertension - Primary    Well controlled, no changes to meds. Encouraged heart healthy diet such as the DASH diet and exercise as tolerated.       Relevant Medications   lisinopril (PRINIVIL,ZESTRIL) 10 MG tablet   Other Relevant Orders   Lipid panel   Comprehensive metabolic panel   Hyperlipidemia LDL goal <100    Tolerating statin, encouraged heart healthy diet, avoid trans fats, minimize simple carbs and saturated fats. Increase exercise as tolerated      Relevant Medications   lisinopril (PRINIVIL,ZESTRIL) 10 MG tablet   Other Relevant Orders   Lipid panel   Comprehensive metabolic panel    Other Visit Diagnoses    Microadenoma ([place])       Relevant Orders   Prolactin      Follow-up: Return in about 6 months (around 07/27/2018), or if symptoms worsen or fail to improve, for hypertension, hyperlipidemia.  [name], DO

## 2018-01-24 NOTE — Assessment & Plan Note (Signed)
Well controlled, no changes to meds. Encouraged heart healthy diet such as the DASH diet and exercise as tolerated.  °

## 2018-01-25 LAB — PROLACTIN: Prolactin: 8.4 ng/mL

## 2018-01-26 ENCOUNTER — Telehealth: Payer: Self-pay | Admitting: Family Medicine

## 2018-01-26 NOTE — Telephone Encounter (Signed)
Copied from [place] [id number]. Topic: Quick Communication - Lab Results >> Jan 26, 2018 10:50 AM Doylene Canning, CMA wrote: Called patient to inform them of 01/24/18 lab results. When patient returns call, triage nurse may disclose results. PT returned call for lab results,  Please call her back at [phone]

## 2018-01-27 ENCOUNTER — Other Ambulatory Visit: Payer: Self-pay | Admitting: *Deleted

## 2018-02-08 ENCOUNTER — Other Ambulatory Visit (INDEPENDENT_AMBULATORY_CARE_PROVIDER_SITE_OTHER): Payer: Medicare Other

## 2018-02-08 DIAGNOSIS — D649 Anemia, unspecified: Secondary | ICD-10-CM | POA: Diagnosis not present

## 2018-02-08 LAB — CBC WITH DIFFERENTIAL/PLATELET
Basophils Absolute: 32 cells/uL (ref 0–200)
Basophils Relative: 0.5 %
EOS PCT: 0.8 %
Eosinophils Absolute: 51 cells/uL (ref 15–500)
HEMATOCRIT: 33.4 % — AB (ref 35.0–45.0)
HEMOGLOBIN: 11.1 g/dL — AB (ref 11.7–15.5)
LYMPHS ABS: 2182 {cells}/uL (ref 850–3900)
MCH: 26.4 pg — ABNORMAL LOW (ref 27.0–33.0)
MCHC: 33.2 g/dL (ref 32.0–36.0)
MCV: 79.3 fL — AB (ref 80.0–100.0)
MPV: 12.4 fL (ref 7.5–12.5)
Monocytes Relative: 5.5 %
NEUTROS ABS: 3782 {cells}/uL (ref 1500–7800)
NEUTROS PCT: 59.1 %
Platelets: 228 10*3/uL (ref 140–400)
RBC: 4.21 10*6/uL (ref 3.80–5.10)
RDW: 13.5 % (ref 11.0–15.0)
Total Lymphocyte: 34.1 %
WBC: 6.4 10*3/uL (ref 3.8–10.8)
WBCMIX: 352 {cells}/uL (ref 200–950)

## 2018-02-08 NOTE — Addendum Note (Signed)
Addended by: Harl Bowie on: 02/08/2018 11:24 AM   Modules accepted: Orders

## 2018-02-09 LAB — PTH, INTACT AND CALCIUM
CALCIUM: 9.9 mg/dL (ref 8.6–10.4)
PTH: 28 pg/mL (ref 14–64)

## 2018-03-17 ENCOUNTER — Other Ambulatory Visit: Payer: Self-pay | Admitting: Family Medicine

## 2018-04-04 ENCOUNTER — Other Ambulatory Visit: Payer: Self-pay | Admitting: Family Medicine

## 2018-07-15 ENCOUNTER — Other Ambulatory Visit: Payer: Self-pay | Admitting: *Deleted

## 2018-07-15 DIAGNOSIS — I10 Essential (primary) hypertension: Secondary | ICD-10-CM

## 2018-07-15 DIAGNOSIS — D352 Benign neoplasm of pituitary gland: Secondary | ICD-10-CM

## 2018-07-15 MED ORDER — LISINOPRIL 10 MG PO TABS: 5.0000 mg | ORAL_TABLET | Freq: Every day | ORAL | 1 refills | Status: DC

## 2018-07-15 MED ORDER — BROMOCRIPTINE MESYLATE 2.5 MG PO TABS: 2.5000 mg | ORAL_TABLET | Freq: Every day | ORAL | 0 refills | Status: DC

## 2018-07-15 NOTE — Addendum Note (Signed)
Addended by: Kem Boroughs D on: 07/15/2018 02:09 PM   Modules accepted: Orders

## 2018-08-01 ENCOUNTER — Ambulatory Visit (INDEPENDENT_AMBULATORY_CARE_PROVIDER_SITE_OTHER): Payer: Medicare Other | Admitting: Family Medicine

## 2018-08-01 ENCOUNTER — Encounter: Payer: Self-pay | Admitting: Family Medicine

## 2018-08-01 VITALS — BP 147/65 | HR 60 | Temp 98.3°F | Resp 16 | Ht 64.0 in | Wt 194.8 lb

## 2018-08-01 DIAGNOSIS — E785 Hyperlipidemia, unspecified: Secondary | ICD-10-CM

## 2018-08-01 DIAGNOSIS — D353 Benign neoplasm of craniopharyngeal duct: Secondary | ICD-10-CM | POA: Diagnosis not present

## 2018-08-01 DIAGNOSIS — D352 Benign neoplasm of pituitary gland: Secondary | ICD-10-CM | POA: Diagnosis not present

## 2018-08-01 DIAGNOSIS — E669 Obesity, unspecified: Secondary | ICD-10-CM | POA: Diagnosis not present

## 2018-08-01 DIAGNOSIS — M19041 Primary osteoarthritis, right hand: Secondary | ICD-10-CM

## 2018-08-01 DIAGNOSIS — M19042 Primary osteoarthritis, left hand: Secondary | ICD-10-CM

## 2018-08-01 DIAGNOSIS — I10 Essential (primary) hypertension: Secondary | ICD-10-CM | POA: Diagnosis not present

## 2018-08-01 LAB — LIPID PANEL
Cholesterol: 163 mg/dL (ref 0–200)
HDL: 43.1 mg/dL (ref >39.00)
LDL CALC: 94 mg/dL (ref 0–99)
NONHDL: 119.42
Total CHOL/HDL Ratio: 4
Triglycerides: 125 mg/dL (ref 0.0–149.0)
VLDL: 25 mg/dL (ref 0.0–40.0)

## 2018-08-01 LAB — COMPREHENSIVE METABOLIC PANEL
ALK PHOS: 34 U/L — AB (ref 39–117)
ALT: 23 U/L (ref 0–35)
AST: 20 U/L (ref 0–37)
Albumin: 4.4 g/dL (ref 3.5–5.2)
BUN: 25 mg/dL — AB (ref 6–23)
CHLORIDE: 103 meq/L (ref 96–112)
CO2: 32 meq/L (ref 19–32)
Calcium: 10.1 mg/dL (ref 8.4–10.5)
Creatinine, Ser: 0.81 mg/dL (ref 0.40–1.20)
GFR: 75.71 mL/min (ref >60.00)
GLUCOSE: 110 mg/dL — AB (ref 70–99)
POTASSIUM: 4 meq/L (ref 3.5–5.1)
SODIUM: 140 meq/L (ref 135–145)
TOTAL PROTEIN: 6.7 g/dL (ref 6.0–8.3)
Total Bilirubin: 0.3 mg/dL (ref 0.2–1.2)

## 2018-08-01 NOTE — Progress Notes (Signed)
Patient ID: Kristin Delgado, female    DOB: October 24, 1956  Age: 62 y.o. MRN: [id number]    Subjective:  Subjective  HPI Kristin Delgado presents for f/u bp , cholesterol and prolactin.   Pt is refusing flu vaccine.  She is c/o arthritis pain in her hands and is wanting to know what to do about that.   No other complaints.   Review of Systems  Constitutional: Negative for chills and fever.  HENT: Negative for congestion and hearing loss.   Eyes: Negative for discharge.  Respiratory: Negative for cough and shortness of breath.   Cardiovascular: Negative for chest pain, palpitations and leg swelling.  Gastrointestinal: Negative for abdominal pain, blood in stool, constipation, diarrhea, nausea and vomiting.  Genitourinary: Negative for dysuria, frequency, hematuria and urgency.  Musculoskeletal: Positive for arthralgias. Negative for back pain and myalgias.  Skin: Negative for rash.  Allergic/Immunologic: Negative for environmental allergies.  Neurological: Negative for dizziness, weakness and headaches.  Hematological: Does not bruise/bleed easily.  Psychiatric/Behavioral: Negative for suicidal ideas. The patient is not nervous/anxious.     History Past Medical History:  Diagnosis Date  . Allergy    seasonal  . Anxiety    closed space like MRI  . Chest pain 2006   admitted, essentially negative  . GERD (gastroesophageal reflux disease)   . Hyperlipidemia   . Hypertension   . 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   Her family history includes Alzheimer's disease in her father; Cancer (age of onset: 92) in her mother; Dementia in her father; Lung cancer in her mother.She reports that she has never smoked. She has never used smokeless tobacco. She reports that she does not drink alcohol or use drugs.  Current Outpatient Medications on File Prior to Visit    Medication Sig Dispense Refill  . acetaminophen (TYLENOL ARTHRITIS PAIN) 650 MG CR tablet Take 650 mg by mouth every 8 (eight) hours as needed. pain     . Ascorbic Acid (VITAMIN C) 1000 MG tablet Take 1,000 mg by mouth 2 (two) times daily.      [name] atorvastatin (LIPITOR) 20 MG tablet TAKE 1 TABLET DAILY 90 tablet 1  . bromocriptine (PARLODEL) 2.5 MG tablet Take 1 tablet (2.5 mg total) by mouth daily. 90 tablet 0  . Calcium Polycarbophil (FIBER-CAPS PO) Take by mouth.    [name] CALCIUM-MAGNESIUM-ZINC PO Take by mouth.    . Cholecalciferol (CVS D3) 5000 units capsule Take 5,000 Units by mouth daily.    . famotidine (PEPCID AC) 10 MG chewable tablet Chew 10 mg by mouth daily.      . fenofibrate 160 MG tablet TAKE 1 TABLET DAILY 90 tablet 1  . hydrochlorothiazide (HYDRODIURIL) 25 MG tablet TAKE 1 TABLET DAILY 90 tablet 1  . lisinopril (PRINIVIL,ZESTRIL) 10 MG tablet Take 0.5 tablets (5 mg total) by mouth daily. 45 tablet 1  . Multiple Vitamin (MULTIVITAMIN) tablet Take 1 tablet by mouth daily.      . Omega 3-6-9 Fatty Acids (OMEGA 3-6-9 COMPLEX PO) Take by mouth.    . Omega-3 Fatty Acids (FISH OIL) 1200 MG CAPS Take 1 capsule by mouth 2 (two) times daily.    . Potassium Gluconate (CVS POTASSIUM GLUCONATE) 2 MEQ TABS Take 1 tablet by mouth daily.    . Probiotic Product (PROBIOTIC ADVANCED PO) Take by mouth.    . [employer] JOHNS WORT PO Take  by mouth.     No current facility-administered medications on file prior to visit.      Objective:  Objective  Physical Exam  Constitutional: She is oriented to person, place, and time. She appears well-developed and well-nourished.  HENT:  Head: Normocephalic and atraumatic.  Eyes: Conjunctivae and EOM are normal.  Neck: Normal range of motion. Neck supple. No JVD present. Carotid bruit is not present. No thyromegaly present.  Cardiovascular: Normal rate, regular rhythm and normal heart sounds.  No murmur heard. Pulmonary/Chest: Effort normal and breath sounds  normal. No respiratory distress. She has no wheezes. She has no rales. She exhibits no tenderness.  Musculoskeletal: She exhibits no edema.  Neurological: She is alert and oriented to person, place, and time.  Psychiatric: She has a normal mood and affect.  Nursing note and vitals reviewed.  BP (!) 147/65   Pulse 60   Temp 98.3 F (36.8 C) (Oral)   Resp 16   Ht 5\' 4"  (1.626 m)   Wt 194 lb 12.8 oz (88.4 kg)   SpO2 100%   BMI 33.44 kg/m  Wt Readings from Last 3 Encounters:  08/01/18 194 lb 12.8 oz (88.4 kg)  01/24/18 193 lb (87.5 kg)  01/24/18 193 lb 3.2 oz (87.6 kg)     Lab Results  Component Value Date   WBC 6.4 02/08/2018   HGB 11.1 (L) 02/08/2018   HCT 33.4 (L) 02/08/2018   PLT 228 02/08/2018   GLUCOSE 100 (H) 01/24/2018   CHOL 160 01/24/2018   TRIG 77.0 01/24/2018   HDL 51.70 01/24/2018   LDLDIRECT 83.0 01/28/2017   LDLCALC 93 01/24/2018   ALT 22 01/24/2018   AST 20 01/24/2018   NA 139 01/24/2018   K 3.8 01/24/2018   CL 102 01/24/2018   CREATININE 0.85 01/24/2018   BUN 21 01/24/2018   CO2 32 01/24/2018   TSH 2.58 04/06/2016   HGBA1C 6.1 01/28/2017    No results found.   Assessment & Plan:  Plan  I am having Kristin Delgado maintain her famotidine, multivitamin, vitamin C, acetaminophen, Fish Oil, Potassium Gluconate, hydrochlorothiazide, CALCIUM-MAGNESIUM-ZINC PO, Cholecalciferol, Omega 3-6-9 Fatty Acids (OMEGA 3-6-9 COMPLEX PO), Probiotic Product (PROBIOTIC ADVANCED PO), ST JOHNS WORT PO, Calcium Polycarbophil (FIBER-CAPS PO), fenofibrate, atorvastatin, bromocriptine, and lisinopril.  No orders of the defined types were placed in this encounter.   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 Encouraged heart healthy diet such as the DASH diet and exercise as tolerated.       Relevant Orders   Comprehensive metabolic panel   Lipid panel   Hyperlipidemia LDL goal <100    Tolerating statin, encouraged heart healthy diet, avoid trans fats, minimize simple carbs and saturated fats. Increase exercise as tolerated      Relevant Orders   Comprehensive metabolic panel   Lipid panel   Obesity (BMI 30-39.9)    Gave her the info for healthy weight and wellness       PITUITARY MICROADENOMA    Check labs  Cont meds       Other Visit Diagnoses    Pituitary adenoma ([place])  Relevant Orders   Prolactin      Follow-up: Return in about 6 months (around 01/30/2019), or if symptoms worsen or fail to improve, for hypertension, hyperlipidemia.  [name], DO

## 2018-08-01 NOTE — Patient Instructions (Signed)

## 2018-08-01 NOTE — Assessment & Plan Note (Signed)
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

## 2018-08-01 NOTE — Assessment & Plan Note (Signed)
Gave her the info for healthy weight and wellness

## 2018-08-01 NOTE — Assessment & Plan Note (Signed)
Well controlled, no changes to meds. Encouraged heart healthy diet such as the DASH diet and exercise as tolerated.  °

## 2018-08-01 NOTE — Assessment & Plan Note (Signed)
Check labs Cont' meds 

## 2018-08-01 NOTE — Assessment & Plan Note (Signed)
Tolerating statin, encouraged heart healthy diet, avoid trans fats, minimize simple carbs and saturated fats. Increase exercise as tolerated 

## 2018-08-02 LAB — PROLACTIN: PROLACTIN: 7 ng/mL

## 2018-09-29 ENCOUNTER — Encounter (INDEPENDENT_AMBULATORY_CARE_PROVIDER_SITE_OTHER): Payer: Self-pay

## 2018-10-12 ENCOUNTER — Ambulatory Visit (INDEPENDENT_AMBULATORY_CARE_PROVIDER_SITE_OTHER): Payer: Medicare Other | Admitting: Family Medicine

## 2018-10-12 ENCOUNTER — Encounter (INDEPENDENT_AMBULATORY_CARE_PROVIDER_SITE_OTHER): Payer: Self-pay | Admitting: Family Medicine

## 2018-10-12 VITALS — BP 135/79 | HR 60 | Temp 98.1°F | Ht 63.0 in | Wt 189.0 lb

## 2018-10-12 DIAGNOSIS — E559 Vitamin D deficiency, unspecified: Secondary | ICD-10-CM | POA: Diagnosis not present

## 2018-10-12 DIAGNOSIS — E66811 Obesity, class 1: Secondary | ICD-10-CM

## 2018-10-12 DIAGNOSIS — Z1331 Encounter for screening for depression: Secondary | ICD-10-CM

## 2018-10-12 DIAGNOSIS — E669 Obesity, unspecified: Secondary | ICD-10-CM | POA: Diagnosis not present

## 2018-10-12 DIAGNOSIS — R0602 Shortness of breath: Secondary | ICD-10-CM | POA: Diagnosis not present

## 2018-10-12 DIAGNOSIS — I10 Essential (primary) hypertension: Secondary | ICD-10-CM | POA: Diagnosis not present

## 2018-10-12 DIAGNOSIS — R5383 Other fatigue: Secondary | ICD-10-CM

## 2018-10-12 DIAGNOSIS — R739 Hyperglycemia, unspecified: Secondary | ICD-10-CM | POA: Diagnosis not present

## 2018-10-12 DIAGNOSIS — Z6833 Body mass index (BMI) 33.0-33.9, adult: Secondary | ICD-10-CM

## 2018-10-12 DIAGNOSIS — Z0289 Encounter for other administrative examinations: Secondary | ICD-10-CM

## 2018-10-13 LAB — COMPREHENSIVE METABOLIC PANEL
A/G RATIO: 1.9 (ref 1.2–2.2)
ALBUMIN: 4.5 g/dL (ref 3.6–4.8)
ALT: 26 IU/L (ref 0–32)
AST: 27 IU/L (ref 0–40)
Alkaline Phosphatase: 38 IU/L — ABNORMAL LOW (ref 39–117)
BILIRUBIN TOTAL: 0.3 mg/dL (ref 0.0–1.2)
BUN / CREAT RATIO: 24 (ref 12–28)
BUN: 21 mg/dL (ref 8–27)
CHLORIDE: 100 mmol/L (ref 96–106)
CO2: 25 mmol/L (ref 20–29)
Calcium: 10.1 mg/dL (ref 8.7–10.3)
Creatinine, Ser: 0.87 mg/dL (ref 0.57–1.00)
GFR calc Af Amer: 82 mL/min/{1.73_m2} (ref >59)
GFR calc non Af Amer: 71 mL/min/{1.73_m2} (ref >59)
Globulin, Total: 2.4 g/dL (ref 1.5–4.5)
Glucose: 83 mg/dL (ref 65–99)
Potassium: 4.1 mmol/L (ref 3.5–5.2)
Sodium: 140 mmol/L (ref 134–144)
Total Protein: 6.9 g/dL (ref 6.0–8.5)

## 2018-10-13 LAB — CBC WITH DIFFERENTIAL/PLATELET
BASOS: 1 %
Basophils Absolute: 0 10*3/uL (ref 0.0–0.2)
EOS (ABSOLUTE): 0.1 10*3/uL (ref 0.0–0.4)
Eos: 1 %
HEMATOCRIT: 34.5 % (ref 34.0–46.6)
HEMOGLOBIN: 11.5 g/dL (ref 11.1–15.9)
IMMATURE GRANS (ABS): 0 10*3/uL (ref 0.0–0.1)
Immature Granulocytes: 0 %
Lymphocytes Absolute: 2.5 10*3/uL (ref 0.7–3.1)
Lymphs: 32 %
MCH: 26.1 pg — AB (ref 26.6–33.0)
MCHC: 33.3 g/dL (ref 31.5–35.7)
MCV: 78 fL — AB (ref 79–97)
MONOCYTES: 6 %
Monocytes Absolute: 0.5 10*3/uL (ref 0.1–0.9)
NEUTROS ABS: 4.8 10*3/uL (ref 1.4–7.0)
Neutrophils: 60 %
Platelets: 243 10*3/uL (ref 150–450)
RBC: 4.4 x10E6/uL (ref 3.77–5.28)
RDW: 13.7 % (ref 12.3–15.4)
WBC: 7.9 10*3/uL (ref 3.4–10.8)

## 2018-10-13 LAB — HEMOGLOBIN A1C
ESTIMATED AVERAGE GLUCOSE: 114 mg/dL
Hgb A1c MFr Bld: 5.6 % (ref 4.8–5.6)

## 2018-10-13 LAB — INSULIN, RANDOM: INSULIN: 5.9 u[IU]/mL (ref 2.6–24.9)

## 2018-10-13 LAB — VITAMIN D 25 HYDROXY (VIT D DEFICIENCY, FRACTURES): VIT D 25 HYDROXY: 41.1 ng/mL (ref 30.0–100.0)

## 2018-10-13 LAB — T4, FREE: Free T4: 0.99 ng/dL (ref 0.82–1.77)

## 2018-10-13 LAB — TSH: TSH: 1.83 u[IU]/mL (ref 0.450–4.500)

## 2018-10-13 LAB — T3: T3, Total: 86 ng/dL (ref 71–180)

## 2018-10-18 ENCOUNTER — Ambulatory Visit (INDEPENDENT_AMBULATORY_CARE_PROVIDER_SITE_OTHER): Payer: Medicare Other | Admitting: Family Medicine

## 2018-10-18 VITALS — BP 118/73 | HR 58 | Temp 98.0°F | Ht 63.0 in | Wt 188.0 lb

## 2018-10-18 DIAGNOSIS — Z6833 Body mass index (BMI) 33.0-33.9, adult: Secondary | ICD-10-CM

## 2018-10-18 DIAGNOSIS — E559 Vitamin D deficiency, unspecified: Secondary | ICD-10-CM | POA: Diagnosis not present

## 2018-10-18 DIAGNOSIS — R7303 Prediabetes: Secondary | ICD-10-CM

## 2018-10-18 DIAGNOSIS — Z9189 Other specified personal risk factors, not elsewhere classified: Secondary | ICD-10-CM

## 2018-10-18 DIAGNOSIS — E669 Obesity, unspecified: Secondary | ICD-10-CM

## 2018-10-18 MED ORDER — VITAMIN D (ERGOCALCIFEROL) 1.25 MG (50000 UNIT) PO CAPS: 50000.0000 [IU] | ORAL_CAPSULE | ORAL | 0 refills | Status: DC

## 2018-10-18 NOTE — Progress Notes (Signed)
Office: [phone]  /  Fax: [phone]   Dear Dr. Carollee Herter,   Thank you for referring Corissa Oguinn to our clinic. The following note includes my evaluation and treatment recommendations.  HPI:   Chief Complaint: OBESITY    Mahala Rommel has been referred by [name] for consultation regarding her obesity and obesity related comorbidities.    Altovise Wahler (MR# [id number]) is a 62 y.o. female who presents on 10/18/2018 for obesity evaluation and treatment. Current BMI is Body mass index is 33.48 kg/m.[name] Gilda has been struggling with her weight for many years and has been unsuccessful in either losing weight, maintaining weight loss, or reaching her healthy weight goal.     Hubert attended our information session and states she is currently in the action stage of change and ready to dedicate time achieving and maintaining a healthier weight. Bemnet is interested in becoming our patient and working on intensive lifestyle modifications including (but not limited to) diet, exercise and weight loss.    Mickie states her family eats meals together she thinks her family will eat healthier with  her her desired weight loss is 39 lbs she has been heavy most of  her life she started gaining weight when her pituitary issues started her heaviest weight ever was 220 lbs. she has significant food cravings issues  she skips meals frequently she struggles with emotional eating    Fatigue Kyndra feels her energy is lower than it should be. This has worsened with weight gain and has not worsened recently. [name] admits to daytime somnolence and she denies waking up still tired. Patient is at risk for obstructive sleep apnea. Patent has a history of symptoms of daytime fatigue, morning headache and hypertension. Patient generally gets 6 or 7 hours of sleep per night, and states they generally have restless sleep. Snoring is not present. Apneic episodes are not present. Epworth  Sleepiness Score is 17 EKG was ordered today which shows poor R wave progression and T wave abnormality (non specific).  Dyspnea on exertion Shalona notes increasing shortness of breath with exercising and seems to be worsening over time with weight gain. She notes getting out of breath sooner with activity than she used to. This has not gotten worse recently. EKG was ordered today which shows poor R wave progression and T wave abnormality (non specific). Glendene denies orthopnea.  Hypertension Patrick Sohm is a 62 y.o. female with hypertension. Kaity Pitstick denies chest pain, chest pressure or headache. She is working weight loss to help control her blood pressure with the goal of decreasing her risk of heart attack and stroke. [name] blood pressure is controlled today.  Hyperglycemia Tekeshia had hyperglycemia on recent labs. She denies polyphagia.  Vitamin D deficiency Zulay has a diagnosis of vitamin D deficiency. She is currently taking vit D supplement. Dore admits to fatigue and denies nausea, vomiting or muscle weakness.  Depression Screen Sadeen's Food and Mood (modified PHQ-9) score was  Depression screen PHQ 2/9 10/12/2018  Decreased Interest 1  Down, Depressed, Hopeless 1  PHQ - 2 Score 2  Altered sleeping 2  Tired, decreased energy 2  Change in appetite 1  Feeling bad or failure about yourself  1  Trouble concentrating 0  Moving slowly or fidgety/restless 0  Suicidal thoughts 0  PHQ-9 Score 8  Difficult doing work/chores Somewhat difficult    ALLERGIES: No Known Allergies  MEDICATIONS: Current Outpatient Medications on File Prior to Visit  Medication Sig Dispense Refill  . acetaminophen (  TYLENOL ARTHRITIS PAIN) 650 MG CR tablet Take 650 mg by mouth every 8 (eight) hours as needed. pain     . Ascorbic Acid (VITAMIN C) 1000 MG tablet Take 1,000 mg by mouth 2 (two) times daily.      [name] atorvastatin (LIPITOR) 20 MG tablet TAKE 1 TABLET DAILY 90 tablet 1  . b  complex vitamins tablet Take 1 tablet by mouth daily.    . bromocriptine (PARLODEL) 2.5 MG tablet Take 1 tablet (2.5 mg total) by mouth daily. 90 tablet 0  . Calcium Polycarbophil (FIBER-CAPS PO) Take by mouth.    [name] CALCIUM-MAGNESIUM-ZINC PO Take by mouth.    . Cholecalciferol ([employer] D3) 5000 units capsule Take 5,000 Units by mouth daily.    . famotidine (PEPCID AC) 10 MG chewable tablet Chew 10 mg by mouth daily.      . fenofibrate 160 MG tablet TAKE 1 TABLET DAILY 90 tablet 1  . ferrous sulfate 325 (65 FE) MG EC tablet Take 325 mg by mouth 3 (three) times daily with meals.    [name] glucosamine-chondroitin 500-400 MG tablet Take 1 tablet by mouth 3 (three) times daily.    . hydrochlorothiazide (HYDRODIURIL) 25 MG tablet TAKE 1 TABLET DAILY 90 tablet 1  . lisinopril (PRINIVIL,ZESTRIL) 10 MG tablet Take 0.5 tablets (5 mg total) by mouth daily. 45 tablet 1  . Multiple Vitamin (MULTIVITAMIN) tablet Take 1 tablet by mouth daily.      . Omega 3-6-9 Fatty Acids (OMEGA 3-6-9 COMPLEX PO) Take by mouth.    . Omega-3 Fatty Acids (FISH OIL) 1200 MG CAPS Take 1 capsule by mouth 2 (two) times daily.    . Potassium Gluconate (CVS POTASSIUM GLUCONATE) 2 MEQ TABS Take 1 tablet by mouth daily.    . Probiotic Product (PROBIOTIC ADVANCED PO) Take by mouth.    . ST JOHNS WORT PO Take by mouth.     No current facility-administered medications on file prior to visit.     PAST MEDICAL HISTORY: Past Medical History:  Diagnosis Date  . Allergy    seasonal  . Anxiety    closed space like MRI  . Back pain \  . Chest pain 2006   admitted, essentially negative  . Constipation   . Depression   . GERD (gastroesophageal reflux disease)   . Hyperlipidemia   . Hypertension   . Infertility, female   . Joint pain   . Pituitary microadenoma ([place])   . Swallowing difficulty   . 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 Vitamin D deficiency     PAST SURGICAL HISTORY: Past Surgical History:  Procedure Laterality Date  .  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 BRAIN SURGERY  1 / 1985   microadenoma  . COCHLEAR IMPLANT  02/2013   Left side    SOCIAL HISTORY: Social History   Tobacco Use  . Smoking status: Never Smoker  . Smokeless tobacco: Never Used  Substance Use Topics  . Alcohol use: No    Comment: rare  . Drug use: No    FAMILY HISTORY: Family History  Problem Relation Age of Onset  . Lung cancer Mother   . Cancer Mother 36       lung   . Alzheimer's disease Father   . Dementia Father     ROS: Review of Systems  Constitutional: Positive for malaise/fatigue.  HENT: Positive for congestion (nasal stuffiness), hearing loss, nosebleeds, sinus pain and tinnitus.        +  Difficult or Painful Swallowing  Eyes:       + Wear Glasses or Contacts + Floaters  Respiratory: Positive for shortness of breath (on exertion).   Cardiovascular: Negative for chest pain and orthopnea.       Negative for chest pressure  Gastrointestinal: Positive for constipation, diarrhea, heartburn and nausea.       + Swallowing Difficulty  Genitourinary: Positive for frequency.  Musculoskeletal: Positive for back pain.       + Muscle or Joint Pain Negative for muscle weakness  Neurological: Negative for headaches.  Endo/Heme/Allergies: Bruises/bleeds easily (bruising).  Psychiatric/Behavioral: Positive for depression. The patient has insomnia.        + Stress    PHYSICAL EXAM: Blood pressure 135/79, pulse 60, temperature 98.1 F (36.7 C), temperature source Oral, height 5\' 3"  (1.6 m), weight 189 lb (85.7 kg), SpO2 98 %. Body mass index is 33.48 kg/m. Physical Exam  Constitutional: She is oriented to person, place, and time. She appears well-developed and well-nourished.  HENT:  Head: Normocephalic and atraumatic.  Nose: Nose normal.  Eyes: EOM are normal. No scleral icterus.  Neck: Normal range of motion. Neck supple. No thyromegaly present.  Cardiovascular: Normal rate and regular rhythm.  Murmur  (heard best at aortic area) heard.  Crescendo systolic murmur is present with a grade of 2/6. Pulmonary/Chest: Effort normal. No respiratory distress.  Abdominal: Soft. There is no tenderness.  + Obesity  Musculoskeletal: Normal range of motion.  Range of Motion normal in all 4 extremities  Neurological: She is alert and oriented to person, place, and time. Coordination normal.  Skin: Skin is warm and dry.  Psychiatric: She has a normal mood and affect.  Vitals reviewed.   RECENT LABS AND TESTS: BMET    Component Value Date/Time   NA 140 10/12/2018 1325   K 4.1 10/12/2018 1325   CL 100 10/12/2018 1325   CO2 25 10/12/2018 1325   GLUCOSE 83 10/12/2018 1325   GLUCOSE 110 (H) 08/01/2018 0932   BUN 21 10/12/2018 1325   CREATININE 0.87 10/12/2018 1325   CALCIUM 10.1 10/12/2018 1325   GFRNONAA 71 10/12/2018 1325   GFRAA 82 10/12/2018 1325   Lab Results  Component Value Date   HGBA1C 5.6 10/12/2018   Lab Results  Component Value Date   INSULIN 5.9 10/12/2018   CBC    Component Value Date/Time   WBC 7.9 10/12/2018 1325   WBC 6.4 02/08/2018 1125   RBC 4.40 10/12/2018 1325   RBC 4.21 02/08/2018 1125   HGB 11.5 10/12/2018 1325   HCT 34.5 10/12/2018 1325   PLT 243 10/12/2018 1325   MCV 78 (L) 10/12/2018 1325   MCH 26.1 (L) 10/12/2018 1325   MCH 26.4 (L) 02/08/2018 1125   MCHC 33.3 10/12/2018 1325   MCHC 33.2 02/08/2018 1125   RDW 13.7 10/12/2018 1325   LYMPHSABS 2.5 10/12/2018 1325   MONOABS 0.4 07/26/2017 1028   EOSABS 0.1 10/12/2018 1325   BASOSABS 0.0 10/12/2018 1325   Iron/TIBC/Ferritin/ %Sat    Component Value Date/Time   IRON 52 12/12/2007 1500   IRONPCTSAT 14.6 (L) 12/12/2007 1500   Lipid Panel     Component Value Date/Time   CHOL 163 08/01/2018 0932   TRIG 125.0 08/01/2018 0932   HDL 43.10 08/01/2018 0932   CHOLHDL 4 08/01/2018 0932   VLDL 25.0 08/01/2018 0932   LDLCALC 94 08/01/2018 0932   LDLDIRECT 83.0 01/28/2017 1011   Hepatic Function Panel  Component Value Date/Time   PROT 6.9 10/12/2018 1325   ALBUMIN 4.5 10/12/2018 1325   AST 27 10/12/2018 1325   ALT 26 10/12/2018 1325   ALKPHOS 38 (L) 10/12/2018 1325   BILITOT 0.3 10/12/2018 1325   BILIDIR 0.0 05/02/2015 0823      Component Value Date/Time   TSH 1.830 10/12/2018 1325   TSH 2.58 04/06/2016 1408   TSH 2.86 07/16/2014 1051    ECG  shows NSR with a rate of 66 BPM INDIRECT CALORIMETER done today shows a VO2 of 215 and a REE of 1496.  Her calculated basal metabolic rate is 4034 thus her basal metabolic rate is better than expected.    ASSESSMENT AND PLAN: Other fatigue - Plan: EKG 12-Lead, CBC with Differential/Platelet, Comprehensive metabolic panel, Insulin, random, TSH, T4, free, T3  Shortness of breath on exertion  Essential hypertension  Hyperglycemia - Plan: Hemoglobin A1c  Vitamin D deficiency - Plan: VITAMIN D 25 Hydroxy (Vit-D Deficiency, Fractures)  Class 1 obesity with serious comorbidity and body mass index (BMI) of 33.0 to 33.9 in adult, unspecified obesity type  PLAN: Fatigue Cassidy was informed that her fatigue may be related to obesity, depression or many other causes. Labs will be ordered, and in the meanwhile Imberly has agreed to work on diet, exercise and weight loss to help with fatigue. Proper sleep hygiene was discussed including the need for 7-8 hours of quality sleep each night. A sleep study was not ordered based on symptoms and Epworth score. We will order indirect calorimetry and EKG today.  Dyspnea on exertion Joclynn's shortness of breath appears to be obesity related and exercise induced. She has agreed to work on weight loss and gradually increase exercise to treat her exercise induced shortness of breath. If Yoana follows our instructions and loses weight without improvement of her shortness of breath, we will plan to refer to pulmonology. We will order indirect calorimetry, EKG and labs today. We will monitor this condition  regularly. Leita agrees to this plan.  Hypertension We discussed sodium restriction, working on healthy weight loss, and a regular exercise program as the means to achieve improved blood pressure control. Zlata agreed with this plan and agreed to follow up as directed. We will continue to monitor her blood pressure as well as her progress with the above lifestyle modifications. We will order FLP, CMP and EKG today. She will continue her medications as prescribed and will watch for signs of hypotension as she continues her lifestyle modifications.  Hyperglycemia Fasting labs will be obtained and results with be discussed with [name] in 2 weeks at her follow up visit. In the meanwhile Allysa was started on a lower simple carbohydrate diet and will work on weight loss efforts.  Vitamin D Deficiency Joshua was informed that low vitamin D levels contributes to fatigue and are associated with obesity, breast, and colon cancer. She will continue Vit D supplement and will follow up for routine testing of vitamin D, at least 2-3 times per year. She was informed of the risk of over-replacement of vitamin D and agrees to not increase her dose unless she discusses this with [name] first. We will check vitamin D level today and Talaysha agrees to follow up with our clinic in 2 weeks.  Depression Screen Sanaii had a mildly positive depression screening. Depression is commonly associated with obesity and often results in emotional eating behaviors. We will monitor this closely and work on CBT to help improve the non-hunger eating patterns. Referral  to Psychology may be required if no improvement is seen as she continues in our clinic.  Obesity Carrington is currently in the action stage of change and her goal is to continue with weight loss efforts. I recommend Danie begin the structured treatment plan as follows:  She has agreed to follow the Category 2 plan Roselin has been instructed to eventually work up to  a goal of 150 minutes of combined cardio and strengthening exercise per week for weight loss and overall health benefits. We discussed the following Behavioral Modification Strategies today: better snacking choices, planning for success, increasing lean protein intake, increasing vegetables and work on meal planning and easy cooking plans   She was informed of the importance of frequent follow up visits to maximize her success with intensive lifestyle modifications for her multiple health conditions. She was informed we would discuss her lab results at her next visit unless there is a critical issue that needs to be addressed sooner. Yesena agreed to keep her next visit at the agreed upon time to discuss these results.    OBESITY BEHAVIORAL INTERVENTION VISIT  Today's visit was # 1   Starting weight: 189 lbs Starting date: 10/12/2018 Today's weight : 189 lbs Today's date: 10/12/2018 Total lbs lost to date: 0 At least 15 minutes were spent on discussing the following behavioral intervention visit.   ASK: We discussed the diagnosis of obesity with Veverly Fells today and Arvis agreed to give [name] permission to discuss obesity behavioral modification therapy today.  ASSESS: Benny has the diagnosis of obesity and her BMI today is 33.49 Pasha is in the action stage of change   ADVISE: Elbia was educated on the multiple health risks of obesity as well as the benefit of weight loss to improve her health. She was advised of the need for long term treatment and the importance of lifestyle modifications to improve her current health and to decrease her risk of future health problems.  AGREE: Multiple dietary modification options and treatment options were discussed and  Seng agreed to follow the recommendations documented in the above note.  ARRANGE: Hiroko was educated on the importance of frequent visits to treat obesity as outlined per CMS and USPSTF guidelines and agreed to  schedule her next follow up appointment today.  I, Doreene Nest, am acting as transcriptionist for Eber Jones, MD   I have reviewed the above documentation for accuracy and completeness, and I agree with the above. - Ilene Qua, MD

## 2018-10-20 NOTE — Progress Notes (Signed)
Office: [phone]  /  Fax: [phone]   HPI:   Chief Complaint: OBESITY Kristin Delgado is here to discuss her progress with her obesity treatment plan. She is on the  follow the Category 2 plan and is following her eating plan approximately 80 % of the time. She states she is exercising by walking 2 miles at work.  Kristin Delgado is doing breakfast on the way home from work: supplementing with chicken broth with tablespoon of cream of mushroom soup and plus/minus protein powder. She is getting approximately 8 oz of meat and 2 cups of vegetables for dinner. She is making her own lunch meal. She has no inappropriate hunger.  Her weight is 188 lb (85.3 kg) today and has had a weight loss of 1 pound over a period of 1 week since her last visit. She has lost 1 lb since starting treatment with [name].  Vitamin D deficiency Kristin Delgado has a diagnosis of vitamin D deficiency. She is currently taking over the counter vit D 5,000 units daily and denies nausea, vomiting or muscle weakness. She reports fatigue.   Pre-Diabetes Kristin Delgado has a diagnosis of prediabetes based on her elevated HgA1c, was improved to 5.6, insulin level interestingly only 5.9 and was informed this puts her at greater risk of developing diabetes. She is not taking metformin currently and continues to work on diet and exercise to decrease risk of diabetes. She denies nausea or hypoglycemia. She is not currently on any medications.   ALLERGIES: No Known Allergies  MEDICATIONS: Current Outpatient Medications on File Prior to Visit  Medication Sig Dispense Refill  . acetaminophen (TYLENOL ARTHRITIS PAIN) 650 MG CR tablet Take 650 mg by mouth every 8 (eight) hours as needed. pain     . Ascorbic Acid (VITAMIN C) 1000 MG tablet Take 1,000 mg by mouth 2 (two) times daily.      [name] atorvastatin (LIPITOR) 20 MG tablet TAKE 1 TABLET DAILY 90 tablet 1  . b complex vitamins tablet Take 1 tablet by mouth daily.    . bromocriptine (PARLODEL) 2.5 MG tablet  Take 1 tablet (2.5 mg total) by mouth daily. 90 tablet 0  . Calcium Polycarbophil (FIBER-CAPS PO) Take by mouth.    [name] CALCIUM-MAGNESIUM-ZINC PO Take by mouth.    . Cholecalciferol ([facility] D3) 5000 units capsule Take 5,000 Units by mouth daily.    . famotidine (PEPCID AC) 10 MG chewable tablet Chew 10 mg by mouth daily.      . fenofibrate 160 MG tablet TAKE 1 TABLET DAILY 90 tablet 1  . ferrous sulfate 325 (65 FE) MG EC tablet Take 325 mg by mouth 3 (three) times daily with meals.    [name] glucosamine-chondroitin 500-400 MG tablet Take 1 tablet by mouth 3 (three) times daily.    . hydrochlorothiazide (HYDRODIURIL) 25 MG tablet TAKE 1 TABLET DAILY 90 tablet 1  . lisinopril (PRINIVIL,ZESTRIL) 10 MG tablet Take 0.5 tablets (5 mg total) by mouth daily. 45 tablet 1  . Multiple Vitamin (MULTIVITAMIN) tablet Take 1 tablet by mouth daily.      . Omega 3-6-9 Fatty Acids (OMEGA 3-6-9 COMPLEX PO) Take by mouth.    . Omega-3 Fatty Acids (FISH OIL) 1200 MG CAPS Take 1 capsule by mouth 2 (two) times daily.    . Potassium Gluconate (CVS POTASSIUM GLUCONATE) 2 MEQ TABS Take 1 tablet by mouth daily.    . Probiotic Product (PROBIOTIC ADVANCED PO) Take by mouth.    . ST JOHNS WORT PO Take by mouth.  No current facility-administered medications on file prior to visit.     PAST MEDICAL HISTORY: Past Medical History:  Diagnosis Date  . Allergy    seasonal  . Anxiety    closed space like MRI  . Back pain \  . Chest pain 2006   admitted, essentially negative  . Constipation   . Depression   . GERD (gastroesophageal reflux disease)   . Hyperlipidemia   . Hypertension   . Infertility, female   . Joint pain   . Pituitary microadenoma ([place])   . Swallowing difficulty   . 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 Vitamin D deficiency     PAST SURGICAL HISTORY: Past Surgical History:  Procedure Laterality Date  . 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 BRAIN SURGERY  1 / 1985   microadenoma  . COCHLEAR IMPLANT   02/2013   Left side    SOCIAL HISTORY: Social History   Tobacco Use  . Smoking status: Never Smoker  . Smokeless tobacco: Never Used  Substance Use Topics  . Alcohol use: No    Comment: rare  . Drug use: No    FAMILY HISTORY: Family History  Problem Relation Age of Onset  . Lung cancer Mother   . Cancer Mother 42       lung   . Alzheimer's disease Father   . Dementia Father     ROS: Review of Systems  Constitutional: Positive for malaise/fatigue and weight loss.  Gastrointestinal: Negative for nausea and vomiting.  Musculoskeletal:       Negative for muscle weakness  Endo/Heme/Allergies:       Negative for hypoglycemia    PHYSICAL EXAM: Blood pressure 118/73, pulse (!) 58, temperature 98 F (36.7 C), temperature source Oral, height 5\' 3"  (1.6 m), weight 188 lb (85.3 kg), SpO2 96 %. Body mass index is 33.3 kg/m. Physical Exam  Constitutional: She is oriented to person, place, and time. She appears well-developed and well-nourished.  HENT:  Head: Normocephalic.  Eyes: Pupils are equal, round, and reactive to light.  Neck: Normal range of motion.  Cardiovascular: Normal rate.  Pulmonary/Chest: Effort normal.  Musculoskeletal: Normal range of motion.  Neurological: She is alert and oriented to person, place, and time.  Skin: Skin is warm and dry.  Psychiatric: She has a normal mood and affect. Her behavior is normal.  Vitals reviewed.   RECENT LABS AND TESTS: BMET    Component Value Date/Time   NA 140 10/12/2018 1325   K 4.1 10/12/2018 1325   CL 100 10/12/2018 1325   CO2 25 10/12/2018 1325   GLUCOSE 83 10/12/2018 1325   GLUCOSE 110 (H) 08/01/2018 0932   BUN 21 10/12/2018 1325   CREATININE 0.87 10/12/2018 1325   CALCIUM 10.1 10/12/2018 1325   GFRNONAA 71 10/12/2018 1325   GFRAA 82 10/12/2018 1325   Lab Results  Component Value Date   HGBA1C 5.6 10/12/2018   HGBA1C 6.1 01/28/2017   Lab Results  Component Value Date   INSULIN 5.9 10/12/2018    CBC    Component Value Date/Time   WBC 7.9 10/12/2018 1325   WBC 6.4 02/08/2018 1125   RBC 4.40 10/12/2018 1325   RBC 4.21 02/08/2018 1125   HGB 11.5 10/12/2018 1325   HCT 34.5 10/12/2018 1325   PLT 243 10/12/2018 1325   MCV 78 (L) 10/12/2018 1325   MCH 26.1 (L) 10/12/2018 1325   MCH 26.4 (L) 02/08/2018 1125   MCHC 33.3 10/12/2018 1325  MCHC 33.2 02/08/2018 1125   RDW 13.7 10/12/2018 1325   LYMPHSABS 2.5 10/12/2018 1325   MONOABS 0.4 07/26/2017 1028   EOSABS 0.1 10/12/2018 1325   BASOSABS 0.0 10/12/2018 1325   Iron/TIBC/Ferritin/ %Sat    Component Value Date/Time   IRON 52 12/12/2007 1500   IRONPCTSAT 14.6 (L) 12/12/2007 1500   Lipid Panel     Component Value Date/Time   CHOL 163 08/01/2018 0932   TRIG 125.0 08/01/2018 0932   HDL 43.10 08/01/2018 0932   CHOLHDL 4 08/01/2018 0932   VLDL 25.0 08/01/2018 0932   LDLCALC 94 08/01/2018 0932   LDLDIRECT 83.0 01/28/2017 1011   Hepatic Function Panel     Component Value Date/Time   PROT 6.9 10/12/2018 1325   ALBUMIN 4.5 10/12/2018 1325   AST 27 10/12/2018 1325   ALT 26 10/12/2018 1325   ALKPHOS 38 (L) 10/12/2018 1325   BILITOT 0.3 10/12/2018 1325   BILIDIR 0.0 05/02/2015 0823      Component Value Date/Time   TSH 1.830 10/12/2018 1325   TSH 2.58 04/06/2016 1408   TSH 2.86 07/16/2014 1051    Ref. Range 10/12/2018 13:25  Vitamin D, 25-Hydroxy Latest Ref Range: 30.0 - 100.0 ng/mL 41.1    ASSESSMENT AND PLAN: Vitamin D deficiency - Plan: Vitamin D, Ergocalciferol, (DRISDOL) 1.25 MG (50000 UT) CAPS capsule  Prediabetes  Class 1 obesity with serious comorbidity and body mass index (BMI) of 33.0 to 33.9 in adult, unspecified obesity type  PLAN: Vitamin D Deficiency Kristin Delgado was informed that low vitamin D levels contributes to fatigue and are associated with obesity, breast, and colon cancer. She agrees to continue to take prescription Vit D @50 ,000 IU every week #4 with no refills and will follow up for routine  testing of vitamin D, at least 2-3 times per year. She was informed of the risk of over-replacement of vitamin D and agrees to not increase her dose unless she discusses this with [name] first. Agrees to follow up with our clinic as directed.   Pre-Diabetes Kristin Delgado will continue to work on weight loss, exercise, and decreasing simple carbohydrates in her diet to help decrease the risk of diabetes. We dicussed metformin including benefits and risks. She was informed that eating too many simple carbohydrates or too many calories at one sitting increases the likelihood of GI side effects. Yazmyn declined metformin for now and a prescription was not written today. Kristin Delgado agreed to follow up with [name] as directed to monitor her progress. We will repeat labs in 3 months.   Obesity Kristin Delgado is currently in the action stage of change. As such, her goal is to continue with weight loss efforts She has agreed to follow the Category 2 plan Kristin Delgado has been instructed to work up to a goal of 150 minutes of combined cardio and strengthening exercise per week for weight loss and overall health benefits. We discussed the following Behavioral Modification Strategies today: increasing lean protein intake, planning for success, travel eating strategies,  and work on meal planning and easy cooking plans   Kristin Delgado has agreed to follow up with our clinic in 2 weeks. She was informed of the importance of frequent follow up visits to maximize her success with intensive lifestyle modifications for her multiple health conditions.   OBESITY BEHAVIORAL INTERVENTION VISIT  Today's visit was # 2   Starting weight: 189 lb Starting date: 10/12/18 Today's weight : Weight: 188 lb (85.3 kg)  Today's date: 10/18/18 Total lbs lost to date: 1  lb At least 15 minutes were spent on discussing the following behavioral intervention visit.   ASK: We discussed the diagnosis of obesity with Kristin Delgado today and Kristin Delgado agreed to give [name]  permission to discuss obesity behavioral modification therapy today.  ASSESS: Kristin Delgado has the diagnosis of obesity and her BMI today is 33.31 Kristin Delgado is in the action stage of change   ADVISE: Kristin Delgado was educated on the multiple health risks of obesity as well as the benefit of weight loss to improve her health. She was advised of the need for long term treatment and the importance of lifestyle modifications to improve her current health and to decrease her risk of future health problems.  AGREE: Multiple dietary modification options and treatment options were discussed and  Kristin Delgado agreed to follow the recommendations documented in the above note.  ARRANGE: Kristin Delgado was educated on the importance of frequent visits to treat obesity as outlined per CMS and USPSTF guidelines and agreed to schedule her next follow up appointment today.  I, Renee Ramus, am acting as transcriptionist for Ilene Qua, MD   I have reviewed the above documentation for accuracy and completeness, and I agree with the above. - Ilene Qua, MD

## 2018-10-29 ENCOUNTER — Other Ambulatory Visit: Payer: Self-pay | Admitting: Family Medicine

## 2018-11-02 ENCOUNTER — Ambulatory Visit (INDEPENDENT_AMBULATORY_CARE_PROVIDER_SITE_OTHER): Payer: Medicare Other | Admitting: Family Medicine

## 2018-11-02 ENCOUNTER — Encounter (INDEPENDENT_AMBULATORY_CARE_PROVIDER_SITE_OTHER): Payer: Self-pay | Admitting: Family Medicine

## 2018-11-02 VITALS — BP 141/75 | HR 63 | Temp 98.0°F | Ht 63.0 in | Wt 183.0 lb

## 2018-11-02 DIAGNOSIS — E669 Obesity, unspecified: Secondary | ICD-10-CM | POA: Diagnosis not present

## 2018-11-02 DIAGNOSIS — Z6832 Body mass index (BMI) 32.0-32.9, adult: Secondary | ICD-10-CM

## 2018-11-02 DIAGNOSIS — I10 Essential (primary) hypertension: Secondary | ICD-10-CM

## 2018-11-02 DIAGNOSIS — K5909 Other constipation: Secondary | ICD-10-CM | POA: Diagnosis not present

## 2018-11-02 DIAGNOSIS — Z9189 Other specified personal risk factors, not elsewhere classified: Secondary | ICD-10-CM

## 2018-11-02 MED ORDER — POLYETHYLENE GLYCOL 3350 17 GM/SCOOP PO POWD: 17.0000 g | Freq: Every day | ORAL | 0 refills | Status: DC

## 2018-11-02 NOTE — Progress Notes (Signed)
Office: [phone]  /  Fax: [phone]   HPI:   Chief Complaint: OBESITY Kristin Delgado is here to discuss her progress with her obesity treatment plan. She is on the Category 2 plan and is following her eating plan approximately 100 % of the time. She states she is walking 2.5 miles per day. Annalysse has been being mindful of intake. She is eating broth with 1/2 cup of soup and unflan protein power, 8 oz protein, and 1 or 2 vegetables. Then before 6 pm, she is doing around 4 oz of [place] and low carbohydrate tortilla and occasionally a yogurt. She denies hunger after meals.  Her weight is 183 lb (83 kg) today and has had a weight loss of 5 pounds over a period of 2 weeks since her last visit. She has lost 6 lbs since starting treatment with [name].  Constipation Kyllie notes constipation and noticing change in BM consistency. She is on colace up to 3 times per week, and she is already on fiber tablets. She denies hematochezia or melena. She denies drinking less H20 recently.  Hypertension Jamilya Sarrazin is a 62 y.o. female with hypertension. Glendola's blood pressure is slightly elevated today. She denies chest pain, chest pressure, or headaches. She is on lisinopril and hydrochlorothiazide. She is working weight loss to help control her blood pressure with the goal of decreasing her risk of heart attack and stroke. Hanae's blood pressure is not currently controlled.  At risk for cardiovascular disease Kaysi is at a higher than average risk for cardiovascular disease due to obesity and hypertension. She currently denies any chest pain.  ASSESSMENT AND PLAN:  Other constipation - Plan: polyethylene glycol powder (GLYCOLAX/MIRALAX) powder  Essential hypertension  At risk for heart disease  Class 1 obesity with serious comorbidity and body mass index (BMI) of 32.0 to 32.9 in adult, unspecified obesity type  PLAN:  Constipation Shalice was informed decrease bowel movement frequency is  normal while losing weight, but stools should not be hard or painful. She was advised to increase her H20 intake and work on increasing her fiber intake. High fiber foods were discussed today. Keayra agrees to start miralax 17g PO q daily #1 bottle, if no relief in 3 days then she is to start Docusate. Damara agrees to follow up with our clinic in 2 weeks.  Hypertension We discussed sodium restriction, working on healthy weight loss, and a regular exercise program as the means to achieve improved blood pressure control. Analese agreed with this plan and agreed to follow up as directed. We will continue to monitor her blood pressure as well as her progress with the above lifestyle modifications. Alyzza agrees to continue her medications and will watch for signs of hypotension as she continues her lifestyle modifications. We will follow up on blood pressure at next appointment. Shela agrees to follow up with our clinic in 2 weeks.  Cardiovascular risk counselling Jenalyn was given extended (15 minutes) coronary artery disease prevention counseling today. She is 62 y.o. female and has risk factors for heart disease including obesity and hypertension. We discussed intensive lifestyle modifications today with an emphasis on specific weight loss instructions and strategies. Pt was also informed of the importance of increasing exercise and decreasing saturated fats to help prevent heart disease.  Obesity Nayvie is currently in the action stage of change. As such, her goal is to continue with weight loss efforts She has agreed to follow the Category 2 plan Temara has been instructed to work up  to a goal of 150 minutes of combined cardio and strengthening exercise per week for weight loss and overall health benefits. We discussed the following Behavioral Modification Strategies today: work on meal planning and easy cooking plans, holiday eating strategies, celebration eating strategies, travel eating  strategies, and planning for success   Kaula has agreed to follow up with our clinic in 2 weeks. She was informed of the importance of frequent follow up visits to maximize her success with intensive lifestyle modifications for her multiple health conditions.  ALLERGIES: No Known Allergies  MEDICATIONS: Current Outpatient Medications on File Prior to Visit  Medication Sig Dispense Refill  . acetaminophen (TYLENOL ARTHRITIS PAIN) 650 MG CR tablet Take 650 mg by mouth every 8 (eight) hours as needed. pain     . Ascorbic Acid (VITAMIN C) 1000 MG tablet Take 1,000 mg by mouth 2 (two) times daily.      [name] atorvastatin (LIPITOR) 20 MG tablet TAKE 1 TABLET DAILY 90 tablet 1  . b complex vitamins tablet Take 1 tablet by mouth daily.    . bromocriptine (PARLODEL) 2.5 MG tablet Take 1 tablet (2.5 mg total) by mouth daily. 90 tablet 0  . Calcium Polycarbophil (FIBER-CAPS PO) Take by mouth.    [name] CALCIUM-MAGNESIUM-ZINC PO Take by mouth.    . Cholecalciferol (CVS D3) 5000 units capsule Take 5,000 Units by mouth daily.    . famotidine (PEPCID AC) 10 MG chewable tablet Chew 10 mg by mouth daily.      . fenofibrate 160 MG tablet TAKE 1 TABLET DAILY 90 tablet 1  . ferrous sulfate 325 (65 FE) MG EC tablet Take 325 mg by mouth 3 (three) times daily with meals.    [name] glucosamine-chondroitin 500-400 MG tablet Take 1 tablet by mouth 3 (three) times daily.    . hydrochlorothiazide (HYDRODIURIL) 25 MG tablet TAKE 1 TABLET DAILY 90 tablet 1  . lisinopril (PRINIVIL,ZESTRIL) 10 MG tablet Take 0.5 tablets (5 mg total) by mouth daily. 45 tablet 1  . Multiple Vitamin (MULTIVITAMIN) tablet Take 1 tablet by mouth daily.      . Omega 3-6-9 Fatty Acids (OMEGA 3-6-9 COMPLEX PO) Take by mouth.    . Omega-3 Fatty Acids (FISH OIL) 1200 MG CAPS Take 1 capsule by mouth 2 (two) times daily.    . Potassium Gluconate (CVS POTASSIUM GLUCONATE) 2 MEQ TABS Take 1 tablet by mouth daily.    . Probiotic Product (PROBIOTIC ADVANCED PO)  Take by mouth.    . ST JOHNS WORT PO Take by mouth.    . Vitamin D, Ergocalciferol, (DRISDOL) 1.25 MG (50000 UT) CAPS capsule Take 1 capsule (50,000 Units total) by mouth every 7 (seven) days. 4 capsule 0   No current facility-administered medications on file prior to visit.     PAST MEDICAL HISTORY: Past Medical History:  Diagnosis Date  . Allergy    seasonal  . Anxiety    closed space like MRI  . Back pain \  . Chest pain 2006   admitted, essentially negative  . Constipation   . Depression   . GERD (gastroesophageal reflux disease)   . Hyperlipidemia   . Hypertension   . Infertility, female   . Joint pain   . Pituitary microadenoma ([place])   . Swallowing difficulty   . 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 Vitamin D deficiency     PAST SURGICAL HISTORY: Past Surgical History:  Procedure Laterality Date  . 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  BRAIN SURGERY  1 / 1985   microadenoma  . COCHLEAR IMPLANT  02/2013   Left side    SOCIAL HISTORY: Social History   Tobacco Use  . Smoking status: Never Smoker  . Smokeless tobacco: Never Used  Substance Use Topics  . Alcohol use: No    Comment: rare  . Drug use: No    FAMILY HISTORY: Family History  Problem Relation Age of Onset  . Lung cancer Mother   . Cancer Mother 41       lung   . Alzheimer's disease Father   . Dementia Father     ROS: Review of Systems  Constitutional: Positive for weight loss.  Cardiovascular: Negative for chest pain.       Negative chest pressure  Gastrointestinal: Positive for constipation. Negative for melena.       Negative hematochezia  Neurological: Negative for headaches.    PHYSICAL EXAM: Blood pressure (!) 141/75, pulse 63, temperature 98 F (36.7 C), temperature source Oral, height 5\' 3"  (1.6 m), weight 183 lb (83 kg), SpO2 97 %. Body mass index is 32.42 kg/m. Physical Exam Vitals signs reviewed.  Constitutional:      Appearance: Normal appearance. She is obese.    Cardiovascular:     Rate and Rhythm: Normal rate.  Pulmonary:     Effort: Pulmonary effort is normal.  Musculoskeletal: Normal range of motion.  Skin:    General: Skin is warm and dry.  Neurological:     Mental Status: She is alert and oriented to person, place, and time.  Psychiatric:        Mood and Affect: Mood normal.        Behavior: Behavior normal.     RECENT LABS AND TESTS: BMET    Component Value Date/Time   NA 140 10/12/2018 1325   K 4.1 10/12/2018 1325   CL 100 10/12/2018 1325   CO2 25 10/12/2018 1325   GLUCOSE 83 10/12/2018 1325   GLUCOSE 110 (H) 08/01/2018 0932   BUN 21 10/12/2018 1325   CREATININE 0.87 10/12/2018 1325   CALCIUM 10.1 10/12/2018 1325   GFRNONAA 71 10/12/2018 1325   GFRAA 82 10/12/2018 1325   Lab Results  Component Value Date   HGBA1C 5.6 10/12/2018   HGBA1C 6.1 01/28/2017   Lab Results  Component Value Date   INSULIN 5.9 10/12/2018   CBC    Component Value Date/Time   WBC 7.9 10/12/2018 1325   WBC 6.4 02/08/2018 1125   RBC 4.40 10/12/2018 1325   RBC 4.21 02/08/2018 1125   HGB 11.5 10/12/2018 1325   HCT 34.5 10/12/2018 1325   PLT 243 10/12/2018 1325   MCV 78 (L) 10/12/2018 1325   MCH 26.1 (L) 10/12/2018 1325   MCH 26.4 (L) 02/08/2018 1125   MCHC 33.3 10/12/2018 1325   MCHC 33.2 02/08/2018 1125   RDW 13.7 10/12/2018 1325   LYMPHSABS 2.5 10/12/2018 1325   MONOABS 0.4 07/26/2017 1028   EOSABS 0.1 10/12/2018 1325   BASOSABS 0.0 10/12/2018 1325   Iron/TIBC/Ferritin/ %Sat    Component Value Date/Time   IRON 52 12/12/2007 1500   IRONPCTSAT 14.6 (L) 12/12/2007 1500   Lipid Panel     Component Value Date/Time   CHOL 163 08/01/2018 0932   TRIG 125.0 08/01/2018 0932   HDL 43.10 08/01/2018 0932   CHOLHDL 4 08/01/2018 0932   VLDL 25.0 08/01/2018 0932   LDLCALC 94 08/01/2018 0932   LDLDIRECT 83.0 01/28/2017 1011   Hepatic Function Panel  Component Value Date/Time   PROT 6.9 10/12/2018 1325   ALBUMIN 4.5 10/12/2018  1325   AST 27 10/12/2018 1325   ALT 26 10/12/2018 1325   ALKPHOS 38 (L) 10/12/2018 1325   BILITOT 0.3 10/12/2018 1325   BILIDIR 0.0 05/02/2015 0823      Component Value Date/Time   TSH 1.830 10/12/2018 1325   TSH 2.58 04/06/2016 1408   TSH 2.86 07/16/2014 1051      OBESITY BEHAVIORAL INTERVENTION VISIT  Today's visit was # 3   Starting weight: 189 lbs Starting date: 10/12/18 Today's weight : 183 lbs  Today's date: 11/02/2018 Total lbs lost to date: 6    ASK: We discussed the diagnosis of obesity with Veverly Fells today and [name] agreed to give [name] permission to discuss obesity behavioral modification therapy today.  ASSESS: Heidemarie has the diagnosis of obesity and her BMI today is 32.42 Briel is in the action stage of change   ADVISE: Reeshemah was educated on the multiple health risks of obesity as well as the benefit of weight loss to improve her health. She was advised of the need for long term treatment and the importance of lifestyle modifications to improve her current health and to decrease her risk of future health problems.  AGREE: Multiple dietary modification options and treatment options were discussed and  Maree agreed to follow the recommendations documented in the above note.  ARRANGE: Tahirih was educated on the importance of frequent visits to treat obesity as outlined per CMS and USPSTF guidelines and agreed to schedule her next follow up appointment today.  I, Trixie Dredge, am acting as transcriptionist for Ilene Qua, MD  I have reviewed the above documentation for accuracy and completeness, and I agree with the above. - Ilene Qua, MD

## 2018-11-21 ENCOUNTER — Other Ambulatory Visit: Payer: Self-pay | Admitting: Family Medicine

## 2018-11-21 DIAGNOSIS — D352 Benign neoplasm of pituitary gland: Secondary | ICD-10-CM

## 2018-11-28 ENCOUNTER — Ambulatory Visit (INDEPENDENT_AMBULATORY_CARE_PROVIDER_SITE_OTHER): Payer: Medicare Other | Admitting: Family Medicine

## 2018-11-28 ENCOUNTER — Encounter (INDEPENDENT_AMBULATORY_CARE_PROVIDER_SITE_OTHER): Payer: Self-pay | Admitting: Family Medicine

## 2018-11-28 VITALS — BP 125/76 | HR 56 | Temp 98.0°F | Ht 63.0 in | Wt 182.0 lb

## 2018-11-28 DIAGNOSIS — Z6832 Body mass index (BMI) 32.0-32.9, adult: Secondary | ICD-10-CM

## 2018-11-28 DIAGNOSIS — E669 Obesity, unspecified: Secondary | ICD-10-CM | POA: Diagnosis not present

## 2018-11-28 DIAGNOSIS — E559 Vitamin D deficiency, unspecified: Secondary | ICD-10-CM | POA: Diagnosis not present

## 2018-11-28 DIAGNOSIS — I10 Essential (primary) hypertension: Secondary | ICD-10-CM

## 2018-11-28 MED ORDER — VITAMIN D (ERGOCALCIFEROL) 1.25 MG (50000 UNIT) PO CAPS: 50000.0000 [IU] | ORAL_CAPSULE | ORAL | 0 refills | Status: DC

## 2018-11-28 MED ORDER — HYDROCHLOROTHIAZIDE 12.5 MG PO TABS: 25.0000 mg | ORAL_TABLET | Freq: Every day | ORAL | 0 refills | Status: DC

## 2018-11-29 NOTE — Progress Notes (Signed)
Office: [phone]  /  Fax: [phone]   HPI:   Chief Complaint: OBESITY Kristin Delgado is here to discuss her progress with her obesity treatment plan. She is on the Category 2 plan and is following her eating plan approximately 80 % of the time. She states she is walking 2 miles a day. [name] had 1 week off from work and had less activity during that week. She doesn't feel she is getting all the protein in at her biggest meal. She is trying to utilize her crockpot or slow cooker recipes.  Her weight is 182 lb (82.6 kg) today and has had a weight loss of 1 pound over a period of 3 to 4 weeks since her last visit. She has lost 7 lbs since starting treatment with [name].  Vitamin D Deficiency Kristin Delgado has a diagnosis of vitamin D deficiency. She is currently taking prescription Vit D. She notes fatigue and denies nausea, vomiting or muscle weakness.  Hypertension Kristin Delgado Well is a 63 y.o. female with hypertension. Kristin Delgado's blood pressure is controlled. She denies chest pain, chest pressure, or headaches. She is working weight loss to help control her blood pressure with the goal of decreasing her risk of heart attack and stroke.   ASSESSMENT AND PLAN:  Vitamin D deficiency - Plan: Vitamin D, Ergocalciferol, (DRISDOL) 1.25 MG (50000 UT) CAPS capsule  Essential hypertension - Plan: hydrochlorothiazide (HYDRODIURIL) 12.5 MG tablet  Class 1 obesity with serious comorbidity and body mass index (BMI) of 32.0 to 32.9 in adult, unspecified obesity type  PLAN:  Vitamin D Deficiency Kristin Delgado was informed that low vitamin D levels contributes to fatigue and are associated with obesity, breast, and colon cancer. Kristin Delgado agrees to continue taking prescription Vit D @50 ,000 IU every week #4 and we will refill for 1 month. She will follow up for routine testing of vitamin D, at least 2-3 times per year. She was informed of the risk of over-replacement of vitamin D and agrees to not increase her dose unless  she discusses this with [name] first. Kristin Delgado agrees to follow up with our clinic in 2 weeks.  Hypertension We discussed sodium restriction, working on healthy weight loss, and a regular exercise program as the means to achieve improved blood pressure control. Kristin Delgado agreed with this plan and agreed to follow up as directed. We will continue to monitor her blood pressure as well as her progress with the above lifestyle modifications. Kristin Delgado agrees to continue her current medications and she agrees to continue taking hydrochlorothiazide 12.5 mg PO daily #30 and we will refill for 1 month. She will watch for signs of hypotension as she continues her lifestyle modifications. Kristin Delgado agrees to follow up with our clinic in 2 weeks.  Obesity Kristin Delgado is currently in the action stage of change. As such, her goal is to continue with weight loss efforts She has agreed to keep a food journal with 400-500 calories and 35+ grams of protein at supper daily and follow the Category 2 plan Kristin Delgado has been instructed to work up to a goal of 150 minutes of combined cardio and strengthening exercise per week for weight loss and overall health benefits. We discussed the following Behavioral Modification Strategies today: increasing lean protein intake, increasing vegetables, work on meal planning and easy cooking plans, and planning for success   Kristin Delgado has agreed to follow up with our clinic in 2 weeks. She was informed of the importance of frequent follow up visits to maximize her success with intensive lifestyle  modifications for her multiple health conditions.  ALLERGIES: No Known Allergies  MEDICATIONS: Current Outpatient Medications on File Prior to Visit  Medication Sig Dispense Refill  . acetaminophen (TYLENOL ARTHRITIS PAIN) 650 MG CR tablet Take 650 mg by mouth every 8 (eight) hours as needed. pain     . Ascorbic Acid (VITAMIN C) 1000 MG tablet Take 1,000 mg by mouth 2 (two) times daily.      Kristin Delgado  atorvastatin (LIPITOR) 20 MG tablet TAKE 1 TABLET DAILY 90 tablet 1  . b complex vitamins tablet Take 1 tablet by mouth daily.    . bromocriptine (PARLODEL) 2.5 MG tablet TAKE 1 TABLET DAILY 90 tablet 0  . Calcium Polycarbophil (FIBER-CAPS PO) Take by mouth.    Kristin Delgado CALCIUM-MAGNESIUM-ZINC PO Take by mouth.    . Cholecalciferol ([employer] D3) 5000 units capsule Take 5,000 Units by mouth daily.    . famotidine (PEPCID AC) 10 MG chewable tablet Chew 10 mg by mouth daily.      . fenofibrate 160 MG tablet TAKE 1 TABLET DAILY 90 tablet 1  . ferrous sulfate 325 (65 FE) MG EC tablet Take 325 mg by mouth 3 (three) times daily with meals.    Kristin Delgado glucosamine-chondroitin 500-400 MG tablet Take 1 tablet by mouth 3 (three) times daily.    Kristin Delgado lisinopril (PRINIVIL,ZESTRIL) 10 MG tablet Take 0.5 tablets (5 mg total) by mouth daily. 45 tablet 1  . Multiple Vitamin (MULTIVITAMIN) tablet Take 1 tablet by mouth daily.      . Omega 3-6-9 Fatty Acids (OMEGA 3-6-9 COMPLEX PO) Take by mouth.    . Omega-3 Fatty Acids (FISH OIL) 1200 MG CAPS Take 1 capsule by mouth 2 (two) times daily.    . polyethylene glycol powder (GLYCOLAX/MIRALAX) powder Take 17 g by mouth daily. 3350 g 0  . Potassium Gluconate (CVS POTASSIUM GLUCONATE) 2 MEQ TABS Take 1 tablet by mouth daily.    . Probiotic Product (PROBIOTIC ADVANCED PO) Take by mouth.    . ST JOHNS WORT PO Take by mouth.     No current facility-administered medications on file prior to visit.     PAST MEDICAL HISTORY: Past Medical History:  Diagnosis Date  . Allergy    seasonal  . Anxiety    closed space like MRI  . Back pain \  . Chest pain 2006   admitted, essentially negative  . Constipation   . Depression   . GERD (gastroesophageal reflux disease)   . Hyperlipidemia   . Hypertension   . Infertility, female   . Joint pain   . Pituitary microadenoma ([place])   . Swallowing difficulty   . 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 Vitamin D deficiency     PAST SURGICAL  HISTORY: Past Surgical History:  Procedure Laterality Date  . 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 BRAIN SURGERY  1 / 1985   microadenoma  . COCHLEAR IMPLANT  02/2013   Left side    SOCIAL HISTORY: Social History   Tobacco Use  . Smoking status: Never Smoker  . Smokeless tobacco: Never Used  Substance Use Topics  . Alcohol use: No    Comment: rare  . Drug use: No    FAMILY HISTORY: Family History  Problem Relation Age of Onset  . Lung cancer Mother   . Cancer Mother 44       lung   . Alzheimer's disease Father   . Dementia Father     ROS: Review of Systems  Constitutional: Positive for malaise/fatigue and weight loss.  Cardiovascular: Negative for chest pain.       Negative chest pressure  Gastrointestinal: Negative for nausea and vomiting.  Musculoskeletal:       Negative muscle weakness  Neurological: Negative for headaches.    PHYSICAL EXAM: Blood pressure 125/76, pulse (!) 56, temperature 98 F (36.7 C), temperature source Oral, height 5\' 3"  (1.6 m), weight 182 lb (82.6 kg), SpO2 98 %. Body mass index is 32.24 kg/m. Physical Exam Vitals signs reviewed.  Constitutional:      Appearance: Normal appearance. She is obese.  Cardiovascular:     Rate and Rhythm: Normal rate.     Pulses: Normal pulses.  Pulmonary:     Effort: Pulmonary effort is normal.     Breath sounds: Normal breath sounds.  Musculoskeletal: Normal range of motion.  Skin:    General: Skin is warm and dry.  Neurological:     Mental Status: She is alert and oriented to person, place, and time.  Psychiatric:        Mood and Affect: Mood normal.        Behavior: Behavior normal.     RECENT LABS AND TESTS: BMET    Component Value Date/Time   NA 140 10/12/2018 1325   K 4.1 10/12/2018 1325   CL 100 10/12/2018 1325   CO2 25 10/12/2018 1325   GLUCOSE 83 10/12/2018 1325   GLUCOSE 110 (H) 08/01/2018 0932   BUN 21 10/12/2018 1325   CREATININE 0.87 10/12/2018 1325   CALCIUM 10.1  10/12/2018 1325   GFRNONAA 71 10/12/2018 1325   GFRAA 82 10/12/2018 1325   Lab Results  Component Value Date   HGBA1C 5.6 10/12/2018   HGBA1C 6.1 01/28/2017   Lab Results  Component Value Date   INSULIN 5.9 10/12/2018   CBC    Component Value Date/Time   WBC 7.9 10/12/2018 1325   WBC 6.4 02/08/2018 1125   RBC 4.40 10/12/2018 1325   RBC 4.21 02/08/2018 1125   HGB 11.5 10/12/2018 1325   HCT 34.5 10/12/2018 1325   PLT 243 10/12/2018 1325   MCV 78 (L) 10/12/2018 1325   MCH 26.1 (L) 10/12/2018 1325   MCH 26.4 (L) 02/08/2018 1125   MCHC 33.3 10/12/2018 1325   MCHC 33.2 02/08/2018 1125   RDW 13.7 10/12/2018 1325   LYMPHSABS 2.5 10/12/2018 1325   MONOABS 0.4 07/26/2017 1028   EOSABS 0.1 10/12/2018 1325   BASOSABS 0.0 10/12/2018 1325   Iron/TIBC/Ferritin/ %Sat    Component Value Date/Time   IRON 52 12/12/2007 1500   IRONPCTSAT 14.6 (L) 12/12/2007 1500   Lipid Panel     Component Value Date/Time   CHOL 163 08/01/2018 0932   TRIG 125.0 08/01/2018 0932   HDL 43.10 08/01/2018 0932   CHOLHDL 4 08/01/2018 0932   VLDL 25.0 08/01/2018 0932   LDLCALC 94 08/01/2018 0932   LDLDIRECT 83.0 01/28/2017 1011   Hepatic Function Panel     Component Value Date/Time   PROT 6.9 10/12/2018 1325   ALBUMIN 4.5 10/12/2018 1325   AST 27 10/12/2018 1325   ALT 26 10/12/2018 1325   ALKPHOS 38 (L) 10/12/2018 1325   BILITOT 0.3 10/12/2018 1325   BILIDIR 0.0 05/02/2015 0823      Component Value Date/Time   TSH 1.830 10/12/2018 1325   TSH 2.58 04/06/2016 1408   TSH 2.86 07/16/2014 1051      OBESITY BEHAVIORAL INTERVENTION VISIT  Today's visit was # 4  Starting weight: 189 lbs Starting date: 10/12/18 Today's weight : 182 lbs  Today's date: 11/28/2018 Total lbs lost to date: 7 At least 15 minutes were spent on discussing the following behavioral intervention visit.   ASK: We discussed the diagnosis of obesity with Veverly Fells today and Nazifa agreed to give [name] permission to  discuss obesity behavioral modification therapy today.  ASSESS: Drisana has the diagnosis of obesity and her BMI today is 32.25 Estefania is in the action stage of change   ADVISE: Adasia was educated on the multiple health risks of obesity as well as the benefit of weight loss to improve her health. She was advised of the need for long term treatment and the importance of lifestyle modifications to improve her current health and to decrease her risk of future health problems.  AGREE: Multiple dietary modification options and treatment options were discussed and  Natosha agreed to follow the recommendations documented in the above note.  ARRANGE: Makynlie was educated on the importance of frequent visits to treat obesity as outlined per CMS and USPSTF guidelines and agreed to schedule her next follow up appointment today.  I, Trixie Dredge, am acting as transcriptionist for Ilene Qua, MD  I have reviewed the above documentation for accuracy and completeness, and I agree with the above. - Ilene Qua, MD

## 2018-12-19 ENCOUNTER — Encounter (INDEPENDENT_AMBULATORY_CARE_PROVIDER_SITE_OTHER): Payer: Self-pay | Admitting: Family Medicine

## 2018-12-19 ENCOUNTER — Ambulatory Visit (INDEPENDENT_AMBULATORY_CARE_PROVIDER_SITE_OTHER): Payer: Medicare Other | Admitting: Family Medicine

## 2018-12-19 VITALS — BP 126/78 | HR 62 | Temp 98.2°F | Ht 63.0 in | Wt 175.0 lb

## 2018-12-19 DIAGNOSIS — I10 Essential (primary) hypertension: Secondary | ICD-10-CM | POA: Diagnosis not present

## 2018-12-19 DIAGNOSIS — E669 Obesity, unspecified: Secondary | ICD-10-CM | POA: Diagnosis not present

## 2018-12-19 DIAGNOSIS — Z9189 Other specified personal risk factors, not elsewhere classified: Secondary | ICD-10-CM | POA: Diagnosis not present

## 2018-12-19 DIAGNOSIS — Z6831 Body mass index (BMI) 31.0-31.9, adult: Secondary | ICD-10-CM | POA: Diagnosis not present

## 2018-12-19 DIAGNOSIS — E559 Vitamin D deficiency, unspecified: Secondary | ICD-10-CM | POA: Diagnosis not present

## 2018-12-19 MED ORDER — VITAMIN D (ERGOCALCIFEROL) 1.25 MG (50000 UNIT) PO CAPS: 50000.0000 [IU] | ORAL_CAPSULE | ORAL | 0 refills | Status: DC

## 2018-12-19 MED ORDER — HYDROCHLOROTHIAZIDE 12.5 MG PO TABS: 25.0000 mg | ORAL_TABLET | Freq: Every day | ORAL | 0 refills | Status: DC

## 2018-12-19 NOTE — Progress Notes (Signed)
Office: [phone]  /  Fax: [phone]   HPI:   Chief Complaint: OBESITY Kristin Delgado is here to discuss her progress with her obesity treatment plan. She is on the keep a food journal with 400-500 calories and 35+ grams of protein at supper daily and follow the Category 2 plan and is following her eating plan approximately 90 % of the time. She states she is walking 2 and a half miles 5 times per week. Kristin Delgado is having a hard time getting all calories in for the day per My[employer]itness Pal. She is getting around [phone] calories per day.  Her weight is 175 lb (79.4 kg) today and has had a weight loss of 7 pounds over a period of 3 weeks since her last visit. She has lost 14 lbs since starting treatment with [name].  Hypertension Kristin Delgado is a 63 y.o. female with hypertension. Seeley's blood pressure is controlled today. She denies chest pain, chest pressure, or headaches. She is on hydrochlorothiazide and lisinopril. She is working on weight loss to help control her blood pressure with the goal of decreasing her risk of heart attack and stroke.   Vitamin D Deficiency [name] has a diagnosis of vitamin D deficiency. She is currently taking prescription Vit D. She notes fatigue and denies nausea, vomiting or muscle weakness.  At risk for osteopenia and osteoporosis [name] is at higher risk of osteopenia and osteoporosis due to vitamin D deficiency.   ASSESSMENT AND PLAN:  Essential hypertension - Plan: hydrochlorothiazide (HYDRODIURIL) 12.5 MG tablet  Vitamin D deficiency - Plan: Vitamin D, Ergocalciferol, (DRISDOL) 1.25 MG (50000 UT) CAPS capsule  At risk for osteoporosis  Class 1 obesity with serious comorbidity and body mass index (BMI) of 31.0 to 31.9 in adult, unspecified obesity type  PLAN:  Hypertension We discussed sodium restriction, working on healthy weight loss, and a regular exercise program as the means to achieve improved blood pressure control. Zacari agreed with  this plan and agreed to follow up as directed. We will continue to monitor her blood pressure as well as her progress with the above lifestyle modifications. [name] agrees to continue taking hydrochlorothiazide 25 mg PO daily #30 and we will refill for 1 month. She will watch for signs of hypotension as she continues her lifestyle modifications. [name] agrees to follow up with our clinic in 2 weeks.  Vitamin D Deficiency [name] was informed that low vitamin D levels contributes to fatigue and are associated with obesity, breast, and colon cancer. Kristin Delgado agrees to continue taking prescription Vit D @50 ,000 IU every week #4 and we will refill for 1 month. She will follow up for routine testing of vitamin D, at least 2-3 times per year. She was informed of the risk of over-replacement of vitamin D and agrees to not increase her dose unless she discusses this with [name] first. Kristin Delgado agrees to follow up with our clinic in 2 weeks.  At risk for osteopenia and osteoporosis [name] was given extended (15 minutes) osteoporosis prevention counseling today. Kristin Delgado is at risk for osteopenia and osteoporsis due to her vitamin D deficiency. She was encouraged to take her vitamin D and follow her higher calcium diet and increase strengthening exercise to help strengthen her bones and decrease her risk of osteopenia and osteoporosis.  Obesity Kristin Delgado is currently in the action stage of change. As such, her goal is to continue with weight loss efforts She has agreed to keep a food journal with 1250 calories and 85+ grams of protein  daily Kristin Delgado has been instructed to work up to a goal of 150 minutes of combined cardio and strengthening exercise per week for weight loss and overall health benefits. We discussed the following Behavioral Modification Strategies today: increasing lean protein intake, increasing vegetables, work on meal planning and easy cooking plans, and planning for success Kristin Delgado would like to  add physical activity, but doing resistance training at work.  Kristin Delgado has agreed to follow up with our clinic in 2 weeks. She was informed of the importance of frequent follow up visits to maximize her success with intensive lifestyle modifications for her multiple health conditions.  ALLERGIES: No Known Allergies  MEDICATIONS: Current Outpatient Medications on File Prior to Visit  Medication Sig Dispense Refill  . acetaminophen (TYLENOL ARTHRITIS PAIN) 650 MG CR tablet Take 650 mg by mouth every 8 (eight) hours as needed. pain     . Ascorbic Acid (VITAMIN C) 1000 MG tablet Take 1,000 mg by mouth 2 (two) times daily.      [name] atorvastatin (LIPITOR) 20 MG tablet TAKE 1 TABLET DAILY 90 tablet 1  . b complex vitamins tablet Take 1 tablet by mouth daily.    . bromocriptine (PARLODEL) 2.5 MG tablet TAKE 1 TABLET DAILY 90 tablet 0  . Calcium Polycarbophil (FIBER-CAPS PO) Take by mouth.    [name] CALCIUM-MAGNESIUM-ZINC PO Take by mouth.    . Cholecalciferol (CVS D3) 5000 units capsule Take 5,000 Units by mouth daily.    . famotidine (PEPCID AC) 10 MG chewable tablet Chew 10 mg by mouth daily.      . fenofibrate 160 MG tablet TAKE 1 TABLET DAILY 90 tablet 1  . ferrous sulfate 325 (65 FE) MG EC tablet Take 325 mg by mouth 3 (three) times daily with meals.    [name] glucosamine-chondroitin 500-400 MG tablet Take 1 tablet by mouth 3 (three) times daily.    [name] lisinopril (PRINIVIL,ZESTRIL) 10 MG tablet Take 0.5 tablets (5 mg total) by mouth daily. 45 tablet 1  . Multiple Vitamin (MULTIVITAMIN) tablet Take 1 tablet by mouth daily.      . Omega 3-6-9 Fatty Acids (OMEGA 3-6-9 COMPLEX PO) Take by mouth.    . Omega-3 Fatty Acids (FISH OIL) 1200 MG CAPS Take 1 capsule by mouth 2 (two) times daily.    . polyethylene glycol powder (GLYCOLAX/MIRALAX) powder Take 17 g by mouth daily. 3350 g 0  . Potassium Gluconate (CVS POTASSIUM GLUCONATE) 2 MEQ TABS Take 1 tablet by mouth daily.    . Probiotic Product (PROBIOTIC  ADVANCED PO) Take by mouth.    . ST JOHNS WORT PO Take by mouth.     No current facility-administered medications on file prior to visit.     PAST MEDICAL HISTORY: Past Medical History:  Diagnosis Date  . Allergy    seasonal  . Anxiety    closed space like MRI  . Back pain \  . Chest pain 2006   admitted, essentially negative  . Constipation   . Depression   . GERD (gastroesophageal reflux disease)   . Hyperlipidemia   . Hypertension   . Infertility, female   . Joint pain   . Pituitary microadenoma ([place])   . Swallowing difficulty   . 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 Vitamin D deficiency     PAST SURGICAL HISTORY: Past Surgical History:  Procedure Laterality Date  . 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 BRAIN SURGERY  1 / 1985   microadenoma  . COCHLEAR  IMPLANT  02/2013   Left side    SOCIAL HISTORY: Social History   Tobacco Use  . Smoking status: Never Smoker  . Smokeless tobacco: Never Used  Substance Use Topics  . Alcohol use: No    Comment: rare  . Drug use: No    FAMILY HISTORY: Family History  Problem Relation Age of Onset  . Lung cancer Mother   . Cancer Mother 31       lung   . Alzheimer's disease Father   . Dementia Father     ROS: Review of Systems  Constitutional: Positive for malaise/fatigue and weight loss.  Cardiovascular: Negative for chest pain.       Negative chest pressure  Gastrointestinal: Negative for nausea and vomiting.  Musculoskeletal:       Negative muscle weakness  Neurological: Negative for headaches.    PHYSICAL EXAM: Blood pressure 126/78, pulse 62, temperature 98.2 F (36.8 C), temperature source Oral, height 5\' 3"  (1.6 m), weight 175 lb (79.4 kg), SpO2 97 %. Body mass index is 31 kg/m. Physical Exam Vitals signs reviewed.  Constitutional:      Appearance: Normal appearance. She is obese.  Cardiovascular:     Rate and Rhythm: Normal rate.     Pulses: Normal pulses.  Pulmonary:     Effort: Pulmonary  effort is normal.     Breath sounds: Normal breath sounds.  Musculoskeletal: Normal range of motion.  Skin:    General: Skin is warm and dry.  Neurological:     Mental Status: She is alert and oriented to person, place, and time.  Psychiatric:        Mood and Affect: Mood normal.        Behavior: Behavior normal.     RECENT LABS AND TESTS: BMET    Component Value Date/Time   NA 140 10/12/2018 1325   K 4.1 10/12/2018 1325   CL 100 10/12/2018 1325   CO2 25 10/12/2018 1325   GLUCOSE 83 10/12/2018 1325   GLUCOSE 110 (H) 08/01/2018 0932   BUN 21 10/12/2018 1325   CREATININE 0.87 10/12/2018 1325   CALCIUM 10.1 10/12/2018 1325   GFRNONAA 71 10/12/2018 1325   GFRAA 82 10/12/2018 1325   Lab Results  Component Value Date   HGBA1C 5.6 10/12/2018   HGBA1C 6.1 01/28/2017   Lab Results  Component Value Date   INSULIN 5.9 10/12/2018   CBC    Component Value Date/Time   WBC 7.9 10/12/2018 1325   WBC 6.4 02/08/2018 1125   RBC 4.40 10/12/2018 1325   RBC 4.21 02/08/2018 1125   HGB 11.5 10/12/2018 1325   HCT 34.5 10/12/2018 1325   PLT 243 10/12/2018 1325   MCV 78 (L) 10/12/2018 1325   MCH 26.1 (L) 10/12/2018 1325   MCH 26.4 (L) 02/08/2018 1125   MCHC 33.3 10/12/2018 1325   MCHC 33.2 02/08/2018 1125   RDW 13.7 10/12/2018 1325   LYMPHSABS 2.5 10/12/2018 1325   MONOABS 0.4 07/26/2017 1028   EOSABS 0.1 10/12/2018 1325   BASOSABS 0.0 10/12/2018 1325   Iron/TIBC/Ferritin/ %Sat    Component Value Date/Time   IRON 52 12/12/2007 1500   IRONPCTSAT 14.6 (L) 12/12/2007 1500   Lipid Panel     Component Value Date/Time   CHOL 163 08/01/2018 0932   TRIG 125.0 08/01/2018 0932   HDL 43.10 08/01/2018 0932   CHOLHDL 4 08/01/2018 0932   VLDL 25.0 08/01/2018 0932   LDLCALC 94 08/01/2018 0932   LDLDIRECT 83.0 01/28/2017  1011   Hepatic Function Panel     Component Value Date/Time   PROT 6.9 10/12/2018 1325   ALBUMIN 4.5 10/12/2018 1325   AST 27 10/12/2018 1325   ALT 26  10/12/2018 1325   ALKPHOS 38 (L) 10/12/2018 1325   BILITOT 0.3 10/12/2018 1325   BILIDIR 0.0 05/02/2015 0823      Component Value Date/Time   TSH 1.830 10/12/2018 1325   TSH 2.58 04/06/2016 1408   TSH 2.86 07/16/2014 1051      OBESITY BEHAVIORAL INTERVENTION VISIT  Today's visit was # 5   Starting weight: 189 lbs Starting date: 10/12/18 Today's weight : 175 lbs  Today's date: 12/19/2018 Total lbs lost to date: 14    ASK: We discussed the diagnosis of obesity with Veverly Fells today and [name] agreed to give [name] permission to discuss obesity behavioral modification therapy today.  ASSESS: Rheya has the diagnosis of obesity and her BMI today is 31.01 Ahmyah is in the action stage of change   ADVISE: Kaleeyah was educated on the multiple health risks of obesity as well as the benefit of weight loss to improve her health. She was advised of the need for long term treatment and the importance of lifestyle modifications to improve her current health and to decrease her risk of future health problems.  AGREE: Multiple dietary modification options and treatment options were discussed and  Carlos agreed to follow the recommendations documented in the above note.  ARRANGE: Janicia was educated on the importance of frequent visits to treat obesity as outlined per CMS and USPSTF guidelines and agreed to schedule her next follow up appointment today.  I, Trixie Dredge, am acting as transcriptionist for Ilene Qua, MD  I have reviewed the above documentation for accuracy and completeness, and I agree with the above. - Ilene Qua, MD

## 2019-01-02 ENCOUNTER — Ambulatory Visit (INDEPENDENT_AMBULATORY_CARE_PROVIDER_SITE_OTHER): Payer: Medicare Other | Admitting: Family Medicine

## 2019-01-02 VITALS — BP 112/74 | HR 64 | Temp 98.1°F | Ht 63.0 in | Wt 173.0 lb

## 2019-01-02 DIAGNOSIS — Z6830 Body mass index (BMI) 30.0-30.9, adult: Secondary | ICD-10-CM

## 2019-01-02 DIAGNOSIS — E669 Obesity, unspecified: Secondary | ICD-10-CM | POA: Diagnosis not present

## 2019-01-02 DIAGNOSIS — I10 Essential (primary) hypertension: Secondary | ICD-10-CM | POA: Diagnosis not present

## 2019-01-02 DIAGNOSIS — E559 Vitamin D deficiency, unspecified: Secondary | ICD-10-CM | POA: Diagnosis not present

## 2019-01-02 MED ORDER — VITAMIN D (ERGOCALCIFEROL) 1.25 MG (50000 UNIT) PO CAPS: 50000.0000 [IU] | ORAL_CAPSULE | ORAL | 0 refills | Status: DC

## 2019-01-02 NOTE — Progress Notes (Signed)
Office: [phone]  /  Fax: [phone]   HPI:   Chief Complaint: OBESITY Kristin Delgado is here to discuss her progress with her obesity treatment plan. She is on the keep a food journal with 1250 calories and 85+ grams of protein daily and is following her eating plan approximately 90 % of the time. She states she is walking 2.5 miles 5 times per week. Kristin Delgado is continuing to follow the plan and enjoyed the flexibility of journaling on [employer]. She was able to work in [employer] the other day. She indulged at a [place].  Her weight is 173 lb (78.5 kg) today and has had a weight loss of 2 pounds over a period of 2 weeks since her last visit. She has lost 16 lbs since starting treatment with [name].  Hypertension Kristin Delgado is a 63 y.o. female with hypertension. [name]'s blood pressure is well controlled. She denies dizziness or lightheadedness. She is working on weight loss to help control her blood pressure with the goal of decreasing her risk of heart attack and stroke.   Vitamin D Deficiency Kristin Delgado has a diagnosis of vitamin D deficiency. She is currently taking prescription Vit D. She notes fatigue and denies nausea, vomiting or muscle weakness.  ASSESSMENT AND PLAN:  Vitamin D deficiency - Plan: Vitamin D, Ergocalciferol, (DRISDOL) 1.25 MG (50000 UT) CAPS capsule  Essential hypertension  Class 1 obesity with serious comorbidity and body mass index (BMI) of 30.0 to 30.9 in adult, unspecified obesity type  PLAN:  Hypertension We discussed sodium restriction, working on healthy weight loss, and a regular exercise program as the means to achieve improved blood pressure control. Kristin Delgado agreed with this plan and agreed to follow up as directed. We will continue to monitor her blood pressure as well as her progress with the above lifestyle modifications. Kristin Delgado agrees to continue her current medications and will watch for signs of hypotension as she continues her  lifestyle modifications. Kristin Delgado agrees to follow up with our clinic in 3 weeks.  Vitamin D Deficiency Kristin Delgado was informed that low vitamin D levels contributes to fatigue and are associated with obesity, breast, and colon cancer. Kristin Delgado agrees to continue taking prescription Vit D @50 ,000 IU every week #4 and we will refill for 1 month. She will follow up for routine testing of vitamin D, at least 2-3 times per year. She was informed of the risk of over-replacement of vitamin D and agrees to not increase her dose unless she discusses this with [name] first. Kristin Delgado agrees to follow up with our clinic in 3 weeks.  Obesity Kristin Delgado is currently in the action stage of change. As such, her goal is to continue with weight loss efforts She has agreed to keep a food journal with 1250 calories and 85+ grams of protein daily Kristin Delgado has been instructed to work up to a goal of 150 minutes of combined cardio and strengthening exercise per week for weight loss and overall health benefits. We discussed the following Behavioral Modification Strategies today: increasing lean protein intake, increasing vegetables and work on meal planning and easy cooking plans, and planning for success   Kristin Delgado has agreed to follow up with our clinic in 3 weeks. She was informed of the importance of frequent follow up visits to maximize her success with intensive lifestyle modifications for her multiple health conditions.  ALLERGIES: No Known Allergies  MEDICATIONS: Current Outpatient Medications on File Prior to Visit  Medication Sig Dispense Refill  . acetaminophen (  TYLENOL ARTHRITIS PAIN) 650 MG CR tablet Take 650 mg by mouth every 8 (eight) hours as needed. pain     . Ascorbic Acid (VITAMIN C) 1000 MG tablet Take 1,000 mg by mouth 2 (two) times daily.      [name] atorvastatin (LIPITOR) 20 MG tablet TAKE 1 TABLET DAILY 90 tablet 1  . b complex vitamins tablet Take 1 tablet by mouth daily.    . bromocriptine (PARLODEL) 2.5 MG  tablet TAKE 1 TABLET DAILY 90 tablet 0  . Calcium Polycarbophil (FIBER-CAPS PO) Take by mouth.    [name] CALCIUM-MAGNESIUM-ZINC PO Take by mouth.    . Cholecalciferol (CVS D3) 5000 units capsule Take 5,000 Units by mouth daily.    . famotidine (PEPCID AC) 10 MG chewable tablet Chew 10 mg by mouth daily.      . fenofibrate 160 MG tablet TAKE 1 TABLET DAILY 90 tablet 1  . ferrous sulfate 325 (65 FE) MG EC tablet Take 325 mg by mouth 3 (three) times daily with meals.    [name] glucosamine-chondroitin 500-400 MG tablet Take 1 tablet by mouth 3 (three) times daily.    . hydrochlorothiazide (HYDRODIURIL) 12.5 MG tablet Take 2 tablets (25 mg total) by mouth daily. 30 tablet 0  . lisinopril (PRINIVIL,ZESTRIL) 10 MG tablet Take 0.5 tablets (5 mg total) by mouth daily. 45 tablet 1  . Multiple Vitamin (MULTIVITAMIN) tablet Take 1 tablet by mouth daily.      . Omega 3-6-9 Fatty Acids (OMEGA 3-6-9 COMPLEX PO) Take by mouth.    . Omega-3 Fatty Acids (FISH OIL) 1200 MG CAPS Take 1 capsule by mouth 2 (two) times daily.    . polyethylene glycol powder (GLYCOLAX/MIRALAX) powder Take 17 g by mouth daily. 3350 g 0  . Potassium Gluconate (CVS POTASSIUM GLUCONATE) 2 MEQ TABS Take 1 tablet by mouth daily.    . Probiotic Product (PROBIOTIC ADVANCED PO) Take by mouth.    . ST JOHNS WORT PO Take by mouth.     No current facility-administered medications on file prior to visit.     PAST MEDICAL HISTORY: Past Medical History:  Diagnosis Date  . Allergy    seasonal  . Anxiety    closed space like MRI  . Back pain \  . Chest pain 2006   admitted, essentially negative  . Constipation   . Depression   . GERD (gastroesophageal reflux disease)   . Hyperlipidemia   . Hypertension   . Infertility, female   . Joint pain   . Pituitary microadenoma ([place])   . Swallowing difficulty   . 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 Vitamin D deficiency     PAST SURGICAL HISTORY: Past Surgical History:  Procedure Laterality Date  .  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 BRAIN SURGERY  1 / 1985   microadenoma  . COCHLEAR IMPLANT  02/2013   Left side    SOCIAL HISTORY: Social History   Tobacco Use  . Smoking status: Never Smoker  . Smokeless tobacco: Never Used  Substance Use Topics  . Alcohol use: No    Comment: rare  . Drug use: No    FAMILY HISTORY: Family History  Problem Relation Age of Onset  . Lung cancer Mother   . Cancer Mother 45       lung   . Alzheimer's disease Father   . Dementia Father     ROS: Review of Systems  Constitutional: Positive for malaise/fatigue and weight loss.  Gastrointestinal:  Negative for nausea and vomiting.  Musculoskeletal:       Negative muscle weakness  Neurological: Negative for dizziness.       Negative lightheadedness    PHYSICAL EXAM: Blood pressure 112/74, pulse 64, temperature 98.1 F (36.7 C), temperature source Oral, height 5\' 3"  (1.6 m), weight 173 lb (78.5 kg), SpO2 98 %. Body mass index is 30.65 kg/m. Physical Exam Vitals signs reviewed.  Constitutional:      Appearance: Normal appearance. She is obese.  Cardiovascular:     Rate and Rhythm: Normal rate.     Pulses: Normal pulses.  Pulmonary:     Effort: Pulmonary effort is normal.     Breath sounds: Normal breath sounds.  Musculoskeletal: Normal range of motion.  Skin:    General: Skin is warm and dry.  Neurological:     Mental Status: She is alert and oriented to person, place, and time.  Psychiatric:        Mood and Affect: Mood normal.        Behavior: Behavior normal.     RECENT LABS AND TESTS: BMET    Component Value Date/Time   NA 140 10/12/2018 1325   K 4.1 10/12/2018 1325   CL 100 10/12/2018 1325   CO2 25 10/12/2018 1325   GLUCOSE 83 10/12/2018 1325   GLUCOSE 110 (H) 08/01/2018 0932   BUN 21 10/12/2018 1325   CREATININE 0.87 10/12/2018 1325   CALCIUM 10.1 10/12/2018 1325   GFRNONAA 71 10/12/2018 1325   GFRAA 82 10/12/2018 1325   Lab Results  Component Value  Date   HGBA1C 5.6 10/12/2018   HGBA1C 6.1 01/28/2017   Lab Results  Component Value Date   INSULIN 5.9 10/12/2018   CBC    Component Value Date/Time   WBC 7.9 10/12/2018 1325   WBC 6.4 02/08/2018 1125   RBC 4.40 10/12/2018 1325   RBC 4.21 02/08/2018 1125   HGB 11.5 10/12/2018 1325   HCT 34.5 10/12/2018 1325   PLT 243 10/12/2018 1325   MCV 78 (L) 10/12/2018 1325   MCH 26.1 (L) 10/12/2018 1325   MCH 26.4 (L) 02/08/2018 1125   MCHC 33.3 10/12/2018 1325   MCHC 33.2 02/08/2018 1125   RDW 13.7 10/12/2018 1325   LYMPHSABS 2.5 10/12/2018 1325   MONOABS 0.4 07/26/2017 1028   EOSABS 0.1 10/12/2018 1325   BASOSABS 0.0 10/12/2018 1325   Iron/TIBC/Ferritin/ %Sat    Component Value Date/Time   IRON 52 12/12/2007 1500   IRONPCTSAT 14.6 (L) 12/12/2007 1500   Lipid Panel     Component Value Date/Time   CHOL 163 08/01/2018 0932   TRIG 125.0 08/01/2018 0932   HDL 43.10 08/01/2018 0932   CHOLHDL 4 08/01/2018 0932   VLDL 25.0 08/01/2018 0932   LDLCALC 94 08/01/2018 0932   LDLDIRECT 83.0 01/28/2017 1011   Hepatic Function Panel     Component Value Date/Time   PROT 6.9 10/12/2018 1325   ALBUMIN 4.5 10/12/2018 1325   AST 27 10/12/2018 1325   ALT 26 10/12/2018 1325   ALKPHOS 38 (L) 10/12/2018 1325   BILITOT 0.3 10/12/2018 1325   BILIDIR 0.0 05/02/2015 0823      Component Value Date/Time   TSH 1.830 10/12/2018 1325   TSH 2.58 04/06/2016 1408   TSH 2.86 07/16/2014 1051      OBESITY BEHAVIORAL INTERVENTION VISIT  Today's visit was # 6   Starting weight: 189 lbs Starting date: 10/12/18 Today's weight : 173 lbs Today's date: 01/02/2019 Total lbs  lost to date: 16 At least 15 minutes were spent on discussing the following behavioral intervention visit.   ASK: We discussed the diagnosis of obesity with Veverly Fells today and Chrisa agreed to give [name] permission to discuss obesity behavioral modification therapy today.  ASSESS: Dia has the diagnosis of obesity and  her BMI today is 30.65 Donna is in the action stage of change   ADVISE: Mckinley was educated on the multiple health risks of obesity as well as the benefit of weight loss to improve her health. She was advised of the need for long term treatment and the importance of lifestyle modifications to improve her current health and to decrease her risk of future health problems.  AGREE: Multiple dietary modification options and treatment options were discussed and  Shelbe agreed to follow the recommendations documented in the above note.  ARRANGE: Mishayla was educated on the importance of frequent visits to treat obesity as outlined per CMS and USPSTF guidelines and agreed to schedule her next follow up appointment today.  I, Trixie Dredge, am acting as transcriptionist for Ilene Qua, MD  I have reviewed the above documentation for accuracy and completeness, and I agree with the above. - Ilene Qua, MD

## 2019-01-23 ENCOUNTER — Encounter (INDEPENDENT_AMBULATORY_CARE_PROVIDER_SITE_OTHER): Payer: Self-pay | Admitting: Family Medicine

## 2019-01-23 ENCOUNTER — Ambulatory Visit (INDEPENDENT_AMBULATORY_CARE_PROVIDER_SITE_OTHER): Payer: Medicare Other | Admitting: Family Medicine

## 2019-01-23 VITALS — BP 161/77 | HR 50 | Temp 97.9°F | Ht 63.0 in | Wt 173.0 lb

## 2019-01-23 DIAGNOSIS — R7303 Prediabetes: Secondary | ICD-10-CM | POA: Diagnosis not present

## 2019-01-23 DIAGNOSIS — E559 Vitamin D deficiency, unspecified: Secondary | ICD-10-CM | POA: Diagnosis not present

## 2019-01-23 DIAGNOSIS — I10 Essential (primary) hypertension: Secondary | ICD-10-CM | POA: Diagnosis not present

## 2019-01-23 DIAGNOSIS — D509 Iron deficiency anemia, unspecified: Secondary | ICD-10-CM

## 2019-01-23 DIAGNOSIS — Z6830 Body mass index (BMI) 30.0-30.9, adult: Secondary | ICD-10-CM | POA: Diagnosis not present

## 2019-01-23 DIAGNOSIS — E669 Obesity, unspecified: Secondary | ICD-10-CM

## 2019-01-23 NOTE — Progress Notes (Signed)
Office: [phone]  /  Fax: [phone]   HPI:   Chief Complaint: OBESITY Kristin Delgado is here to discuss her progress with her obesity treatment plan. She is on the keep a food journal with 1250 calories and 85+ grams of protein daily and is following her eating plan approximately 80 % of the time. She states she is walking 2.5 miles 5 times per week. Kristin Delgado has been traveling, driving to get her daughters at college all last week. She is hitting calories only occasionally but most days not achieving calories. She is hitting protein goal most days.  Her weight is 173 lb (78.5 kg) today and has not lost weight since her last visit. She has lost 16 lbs since starting treatment with [name].  Microcytic Anemia Kristin Delgado has a diagnosis of anemia. Likely due to iron deficiency and she is on iron supplementation.   Vitamin D Deficiency Kristin Delgado has a diagnosis of vitamin D deficiency. She is currently taking prescription Vit D. She notes fatigue and denies nausea, vomiting or muscle weakness  Hypertension Kristin Delgado is a 63 y.o. female with hypertension. Kristin Delgado's blood pressure is uncontrolled today. She denies chest pain, chest pressure, or headaches. She has increase in stress with her daughters at home. She is working on weight loss to help control her blood pressure with the goal of decreasing her risk of heart attack and stroke.   Pre-Diabetes Kristin Delgado has a diagnosis of pre-diabetes based on her elevated Hgb A1c of 6.1 last years. She was informed this puts her at greater risk of developing diabetes. She is not taking metformin currently and notes minimal carbohydrates cravings. She continues to work on diet and exercise to decrease risk of diabetes. She denies nausea or hypoglycemia.  ASSESSMENT AND PLAN:  Microcytic anemia - Plan: CBC With Differential  Vitamin D deficiency - Plan: VITAMIN D 25 Hydroxy (Vit-D Deficiency, Fractures)  Essential hypertension  Prediabetes - Plan:  Comprehensive metabolic panel, Hemoglobin A1c, Insulin, random  Class 1 obesity with serious comorbidity and body mass index (BMI) of 30.0 to 30.9 in adult, unspecified obesity type  PLAN:  Microcytic Anemia The diagnosis of microcytic anemia was discussed with Kristin Delgado and was explained in detail. She was given suggestions of iron rich foods and iron supplement was not prescribed. We will check CBC today. Kristin Delgado agrees to follow up with our clinic in 2 weeks.  Vitamin D Deficiency Kristin Delgado was informed that low vitamin D levels contributes to fatigue and are associated with obesity, breast, and colon cancer. Kristin Delgado agrees to continue taking prescription Vit D @50 ,000 IU every week and will follow up for routine testing of vitamin D, at least 2-3 times per year. She was informed of the risk of over-replacement of vitamin D and agrees to not increase her dose unless she discusses this with [name] first. We will check Vit D level today. Kristin Delgado agrees to follow up with our clinic in 2 weeks.  Hypertension We discussed sodium restriction, working on healthy weight loss, and a regular exercise program as the means to achieve improved blood pressure control. Kristin Delgado agreed with this plan and agreed to follow up as directed. We will continue to monitor her blood pressure as well as her progress with the above lifestyle modifications. Kristin Delgado agrees to continue her medications and will watch for signs of hypotension as she continues her lifestyle modifications. We will check CMP today and follow up on blood pressure at next appointment. Kristin Delgado agrees to follow up with our clinic in  2 weeks.  Pre-Diabetes Kristin Delgado will continue to work on weight loss, exercise, and decreasing simple carbohydrates in her diet to help decrease the risk of diabetes. We dicussed metformin including benefits and risks. She was informed that eating too many simple carbohydrates or too many calories at one sitting increases the  likelihood of GI side effects. Kristin Delgado declined metformin for now and a prescription was not written today. We will check Hgb A1c and insulin today. [name] agreed to follow up with [name] as directed to monitor her progress.  Obesity Kristin Delgado is currently in the action stage of change. As such, her goal is to continue with weight loss efforts She has agreed to keep a food journal with 1250 calories and 85+ grams of protein daily Kristin Delgado has been instructed to work up to a goal of 150 minutes of combined cardio and strengthening exercise per week for weight loss and overall health benefits. We discussed the following Behavioral Modification Strategies today: increasing lean protein intake, increasing vegetables, work on meal planning and easy cooking plans, planning for success, and keep a strict food journal   Kristin Delgado has agreed to follow up with our clinic in 2 weeks. She was informed of the importance of frequent follow up visits to maximize her success with intensive lifestyle modifications for her multiple health conditions.  ALLERGIES: No Known Allergies  MEDICATIONS: Current Outpatient Medications on File Prior to Visit  Medication Sig Dispense Refill  . acetaminophen (TYLENOL ARTHRITIS PAIN) 650 MG CR tablet Take 650 mg by mouth every 8 (eight) hours as needed. pain     . Ascorbic Acid (VITAMIN C) 1000 MG tablet Take 1,000 mg by mouth 2 (two) times daily.      [name] atorvastatin (LIPITOR) 20 MG tablet TAKE 1 TABLET DAILY 90 tablet 1  . b complex vitamins tablet Take 1 tablet by mouth daily.    . bromocriptine (PARLODEL) 2.5 MG tablet TAKE 1 TABLET DAILY 90 tablet 0  . Calcium Polycarbophil (FIBER-CAPS PO) Take by mouth.    [name] CALCIUM-MAGNESIUM-ZINC PO Take by mouth.    . Cholecalciferol (CVS D3) 5000 units capsule Take 5,000 Units by mouth daily.    . famotidine (PEPCID AC) 10 MG chewable tablet Chew 10 mg by mouth daily.      . fenofibrate 160 MG tablet TAKE 1 TABLET DAILY 90 tablet 1  .  ferrous sulfate 325 (65 FE) MG EC tablet Take 325 mg by mouth 3 (three) times daily with meals.    [name] glucosamine-chondroitin 500-400 MG tablet Take 1 tablet by mouth 3 (three) times daily.    . hydrochlorothiazide (HYDRODIURIL) 12.5 MG tablet Take 2 tablets (25 mg total) by mouth daily. 30 tablet 0  . lisinopril (PRINIVIL,ZESTRIL) 10 MG tablet Take 0.5 tablets (5 mg total) by mouth daily. 45 tablet 1  . Multiple Vitamin (MULTIVITAMIN) tablet Take 1 tablet by mouth daily.      . Omega 3-6-9 Fatty Acids (OMEGA 3-6-9 COMPLEX PO) Take by mouth.    . Omega-3 Fatty Acids (FISH OIL) 1200 MG CAPS Take 1 capsule by mouth 2 (two) times daily.    . polyethylene glycol powder (GLYCOLAX/MIRALAX) powder Take 17 g by mouth daily. 3350 g 0  . Potassium Gluconate (CVS POTASSIUM GLUCONATE) 2 MEQ TABS Take 1 tablet by mouth daily.    . Probiotic Product (PROBIOTIC ADVANCED PO) Take by mouth.    . ST JOHNS WORT PO Take by mouth.    . Vitamin D, Ergocalciferol, (DRISDOL) 1.25 MG (  50000 UT) CAPS capsule Take 1 capsule (50,000 Units total) by mouth every 7 (seven) days. 4 capsule 0   No current facility-administered medications on file prior to visit.     PAST MEDICAL HISTORY: Past Medical History:  Diagnosis Date  . Allergy    seasonal  . Anxiety    closed space like MRI  . Back pain \  . Chest pain 2006   admitted, essentially negative  . Constipation   . Depression   . GERD (gastroesophageal reflux disease)   . Hyperlipidemia   . Hypertension   . Infertility, female   . Joint pain   . Pituitary microadenoma ([place])   . Swallowing difficulty   . 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 Vitamin D deficiency     PAST SURGICAL HISTORY: Past Surgical History:  Procedure Laterality Date  . 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 BRAIN SURGERY  1 / 1985   microadenoma  . COCHLEAR IMPLANT  02/2013   Left side    SOCIAL HISTORY: Social History   Tobacco Use  . Smoking status: Never Smoker  . Smokeless  tobacco: Never Used  Substance Use Topics  . Alcohol use: No    Comment: rare  . Drug use: No    FAMILY HISTORY: Family History  Problem Relation Age of Onset  . Lung cancer Mother   . Cancer Mother 39       lung   . Alzheimer's disease Father   . Dementia Father     ROS: Review of Systems  Constitutional: Positive for malaise/fatigue. Negative for weight loss.  Cardiovascular: Negative for chest pain.       Negative chest pressure  Gastrointestinal: Negative for nausea and vomiting.  Musculoskeletal:       Negative muscle weakness  Neurological: Negative for headaches.  Endo/Heme/Allergies:       Negative hypoglycemia    PHYSICAL EXAM: Blood pressure (!) 161/77, pulse (!) 50, temperature 97.9 F (36.6 C), temperature source Oral, height 5\' 3"  (1.6 m), weight 173 lb (78.5 kg), SpO2 100 %. Body mass index is 30.65 kg/m. Physical Exam Vitals signs reviewed.  Constitutional:      Appearance: Normal appearance. She is obese.  Cardiovascular:     Rate and Rhythm: Normal rate.     Pulses: Normal pulses.  Pulmonary:     Effort: Pulmonary effort is normal.     Breath sounds: Normal breath sounds.  Musculoskeletal: Normal range of motion.  Skin:    General: Skin is warm and dry.  Neurological:     Mental Status: She is alert and oriented to person, place, and time.  Psychiatric:        Mood and Affect: Mood normal.        Behavior: Behavior normal.     RECENT LABS AND TESTS: BMET    Component Value Date/Time   NA 140 10/12/2018 1325   K 4.1 10/12/2018 1325   CL 100 10/12/2018 1325   CO2 25 10/12/2018 1325   GLUCOSE 83 10/12/2018 1325   GLUCOSE 110 (H) 08/01/2018 0932   BUN 21 10/12/2018 1325   CREATININE 0.87 10/12/2018 1325   CALCIUM 10.1 10/12/2018 1325   GFRNONAA 71 10/12/2018 1325   GFRAA 82 10/12/2018 1325   Lab Results  Component Value Date   HGBA1C 5.6 10/12/2018   HGBA1C 6.1 01/28/2017   Lab Results  Component Value Date   INSULIN 5.9  10/12/2018   CBC    Component Value  Date/Time   WBC 7.9 10/12/2018 1325   WBC 6.4 02/08/2018 1125   RBC 4.40 10/12/2018 1325   RBC 4.21 02/08/2018 1125   HGB 11.5 10/12/2018 1325   HCT 34.5 10/12/2018 1325   PLT 243 10/12/2018 1325   MCV 78 (L) 10/12/2018 1325   MCH 26.1 (L) 10/12/2018 1325   MCH 26.4 (L) 02/08/2018 1125   MCHC 33.3 10/12/2018 1325   MCHC 33.2 02/08/2018 1125   RDW 13.7 10/12/2018 1325   LYMPHSABS 2.5 10/12/2018 1325   MONOABS 0.4 07/26/2017 1028   EOSABS 0.1 10/12/2018 1325   BASOSABS 0.0 10/12/2018 1325   Iron/TIBC/Ferritin/ %Sat    Component Value Date/Time   IRON 52 12/12/2007 1500   IRONPCTSAT 14.6 (L) 12/12/2007 1500   Lipid Panel     Component Value Date/Time   CHOL 163 08/01/2018 0932   TRIG 125.0 08/01/2018 0932   HDL 43.10 08/01/2018 0932   CHOLHDL 4 08/01/2018 0932   VLDL 25.0 08/01/2018 0932   LDLCALC 94 08/01/2018 0932   LDLDIRECT 83.0 01/28/2017 1011   Hepatic Function Panel     Component Value Date/Time   PROT 6.9 10/12/2018 1325   ALBUMIN 4.5 10/12/2018 1325   AST 27 10/12/2018 1325   ALT 26 10/12/2018 1325   ALKPHOS 38 (L) 10/12/2018 1325   BILITOT 0.3 10/12/2018 1325   BILIDIR 0.0 05/02/2015 0823      Component Value Date/Time   TSH 1.830 10/12/2018 1325   TSH 2.58 04/06/2016 1408   TSH 2.86 07/16/2014 1051      OBESITY BEHAVIORAL INTERVENTION VISIT  Today's visit was # 7   Starting weight: 189 lbs Starting date: 10/12/18 Today's weight : 173 lbs  Today's date: 01/23/2019 Total lbs lost to date: 16 At least 15 minutes were spent on discussing the following behavioral intervention visit.    01/23/2019  Height 5\' 3"  (1.6 m)  Weight 173 lb (78.5 kg)  BMI (Calculated) 30.65  BLOOD PRESSURE - SYSTOLIC 614  BLOOD PRESSURE - DIASTOLIC 77   Body Fat % 43.1 %  Total Body Water (lbs) 73 lbs    ASK: We discussed the diagnosis of obesity with Veverly Fells today and Ebonee agreed to give [name] permission to discuss  obesity behavioral modification therapy today.  ASSESS: Davionne has the diagnosis of obesity and her BMI today is 30.65 Kemiya is in the action stage of change   ADVISE: Deriana was educated on the multiple health risks of obesity as well as the benefit of weight loss to improve her health. She was advised of the need for long term treatment and the importance of lifestyle modifications to improve her current health and to decrease her risk of future health problems.  AGREE: Multiple dietary modification options and treatment options were discussed and  Xara agreed to follow the recommendations documented in the above note.  ARRANGE: Francheska was educated on the importance of frequent visits to treat obesity as outlined per CMS and USPSTF guidelines and agreed to schedule her next follow up appointment today.  I, Trixie Dredge, am acting as transcriptionist for Ilene Qua, MD  I have reviewed the above documentation for accuracy and completeness, and I agree with the above. - Ilene Qua, MD

## 2019-01-24 LAB — COMPREHENSIVE METABOLIC PANEL
ALBUMIN: 4.2 g/dL (ref 3.8–4.8)
ALT: 46 IU/L — ABNORMAL HIGH (ref 0–32)
AST: 31 IU/L (ref 0–40)
Albumin/Globulin Ratio: 2 (ref 1.2–2.2)
Alkaline Phosphatase: 59 IU/L (ref 39–117)
BUN / CREAT RATIO: 36 — AB (ref 12–28)
BUN: 21 mg/dL (ref 8–27)
Bilirubin Total: 0.2 mg/dL (ref 0.0–1.2)
CO2: 25 mmol/L (ref 20–29)
Calcium: 9.5 mg/dL (ref 8.7–10.3)
Chloride: 105 mmol/L (ref 96–106)
Creatinine, Ser: 0.59 mg/dL (ref 0.57–1.00)
GFR calc Af Amer: 112 mL/min/{1.73_m2} (ref >59)
GFR calc non Af Amer: 97 mL/min/{1.73_m2} (ref >59)
Globulin, Total: 2.1 g/dL (ref 1.5–4.5)
Glucose: 86 mg/dL (ref 65–99)
POTASSIUM: 4.3 mmol/L (ref 3.5–5.2)
SODIUM: 143 mmol/L (ref 134–144)
Total Protein: 6.3 g/dL (ref 6.0–8.5)

## 2019-01-24 LAB — CBC WITH DIFFERENTIAL
BASOS ABS: 0 10*3/uL (ref 0.0–0.2)
Basos: 1 %
EOS (ABSOLUTE): 0.1 10*3/uL (ref 0.0–0.4)
Eos: 1 %
Hematocrit: 38 % (ref 34.0–46.6)
Hemoglobin: 11.5 g/dL (ref 11.1–15.9)
Immature Grans (Abs): 0 10*3/uL (ref 0.0–0.1)
Immature Granulocytes: 0 %
Lymphocytes Absolute: 2.1 10*3/uL (ref 0.7–3.1)
Lymphs: 32 %
MCH: 24.9 pg — ABNORMAL LOW (ref 26.6–33.0)
MCHC: 30.3 g/dL — ABNORMAL LOW (ref 31.5–35.7)
MCV: 82 fL (ref 79–97)
MONOS ABS: 0.3 10*3/uL (ref 0.1–0.9)
Monocytes: 5 %
Neutrophils Absolute: 4 10*3/uL (ref 1.4–7.0)
Neutrophils: 61 %
RBC: 4.61 x10E6/uL (ref 3.77–5.28)
RDW: 14.4 % (ref 11.7–15.4)
WBC: 6.5 10*3/uL (ref 3.4–10.8)

## 2019-01-24 LAB — HEMOGLOBIN A1C
Est. average glucose Bld gHb Est-mCnc: 105 mg/dL
Hgb A1c MFr Bld: 5.3 % (ref 4.8–5.6)

## 2019-01-24 LAB — INSULIN, RANDOM: INSULIN: 6.2 u[IU]/mL (ref 2.6–24.9)

## 2019-01-24 LAB — VITAMIN D 25 HYDROXY (VIT D DEFICIENCY, FRACTURES): Vit D, 25-Hydroxy: 52.5 ng/mL (ref 30.0–100.0)

## 2019-01-30 ENCOUNTER — Encounter: Payer: Self-pay | Admitting: Family Medicine

## 2019-01-30 ENCOUNTER — Other Ambulatory Visit: Payer: Self-pay

## 2019-01-30 ENCOUNTER — Other Ambulatory Visit: Payer: Self-pay | Admitting: Family Medicine

## 2019-01-30 ENCOUNTER — Ambulatory Visit (HOSPITAL_BASED_OUTPATIENT_CLINIC_OR_DEPARTMENT_OTHER)
Admission: RE | Admit: 2019-01-30 | Discharge: 2019-01-30 | Disposition: A | Discharge status: Home / Self Care | Payer: Medicare Other | Source: Ambulatory Visit | Attending: Family Medicine | Admitting: Family Medicine

## 2019-01-30 ENCOUNTER — Ambulatory Visit (INDEPENDENT_AMBULATORY_CARE_PROVIDER_SITE_OTHER): Payer: Medicare Other | Admitting: Family Medicine

## 2019-01-30 VITALS — BP 107/80 | HR 77 | Temp 98.2°F | Resp 16 | Ht 63.0 in | Wt 172.0 lb

## 2019-01-30 DIAGNOSIS — S3992XA Unspecified injury of lower back, initial encounter: Secondary | ICD-10-CM | POA: Diagnosis not present

## 2019-01-30 DIAGNOSIS — M545 Low back pain, unspecified: Secondary | ICD-10-CM

## 2019-01-30 DIAGNOSIS — I10 Essential (primary) hypertension: Secondary | ICD-10-CM | POA: Diagnosis not present

## 2019-01-30 DIAGNOSIS — M5442 Lumbago with sciatica, left side: Secondary | ICD-10-CM

## 2019-01-30 DIAGNOSIS — D352 Benign neoplasm of pituitary gland: Secondary | ICD-10-CM | POA: Diagnosis not present

## 2019-01-30 DIAGNOSIS — E785 Hyperlipidemia, unspecified: Secondary | ICD-10-CM | POA: Diagnosis not present

## 2019-01-30 DIAGNOSIS — M533 Sacrococcygeal disorders, not elsewhere classified: Secondary | ICD-10-CM | POA: Diagnosis not present

## 2019-01-30 LAB — LIPID PANEL
CHOL/HDL RATIO: 6
Cholesterol: 237 mg/dL — ABNORMAL HIGH (ref 0–200)
HDL: 39.6 mg/dL (ref >39.00)
LDL CALC: 161 mg/dL — AB (ref 0–99)
NonHDL: 196.94
TRIGLYCERIDES: 180 mg/dL — AB (ref 0.0–149.0)
VLDL: 36 mg/dL (ref 0.0–40.0)

## 2019-01-30 NOTE — Patient Instructions (Signed)
Acute Back Pain, Adult  Acute back pain is sudden and usually short-lived. It is often caused by an injury to the muscles and tissues in the back. The injury may result from:   A muscle or ligament getting overstretched or torn (strained). Ligaments are tissues that connect bones to each other. Lifting something improperly can cause a back strain.   Wear and tear (degeneration) of the spinal disks. Spinal disks are circular tissue that provides cushioning between the bones of the spine (vertebrae).   Twisting motions, such as while playing sports or doing yard work.   A hit to the back.   Arthritis.  You may have a physical exam, lab tests, and imaging tests to find the cause of your pain. Acute back pain usually goes away with rest and home care.  Follow these instructions at home:  Managing pain, stiffness, and swelling   Take over-the-counter and prescription medicines only as told by your health care provider.   Your health care provider may recommend applying ice during the first 24-48 hours after your pain starts. To do this:  ? Put ice in a plastic bag.  ? Place a towel between your skin and the bag.  ? Leave the ice on for 20 minutes, 2-3 times a day.   If directed, apply heat to the affected area as often as told by your health care provider. Use the heat source that your health care provider recommends, such as a moist heat pack or a heating pad.  ? Place a towel between your skin and the heat source.  ? Leave the heat on for 20-30 minutes.  ? Remove the heat if your skin turns bright red. This is especially important if you are unable to feel pain, heat, or cold. You have a greater risk of getting burned.  Activity     Do not stay in bed. Staying in bed for more than 1-2 days can delay your recovery.   Sit up and stand up straight. Avoid leaning forward when you sit, or hunching over when you stand.  ? If you work at a desk, sit close to it so you do not need to lean over. Keep your chin tucked  in. Keep your neck drawn back, and keep your elbows bent at a right angle. Your arms should look like the letter "L."  ? Sit high and close to the steering wheel when you drive. Add lower back (lumbar) support to your car seat, if needed.   Take short walks on even surfaces as soon as you are able. Try to increase the length of time you walk each day.   Do not sit, drive, or stand in one place for more than 30 minutes at a time. Sitting or standing for long periods of time can put stress on your back.   Do not drive or use heavy machinery while taking prescription pain medicine.   Use proper lifting techniques. When you bend and lift, use positions that put less stress on your back:  ? Bend your knees.  ? Keep the load close to your body.  ? Avoid twisting.   Exercise regularly as told by your health care provider. Exercising helps your back heal faster and helps prevent back injuries by keeping muscles strong and flexible.   Work with a physical therapist to make a safe exercise program, as recommended by your health care provider. Do any exercises as told by your physical therapist.  Lifestyle   Maintain   a healthy weight. Extra weight puts stress on your back and makes it difficult to have good posture.   Avoid activities or situations that make you feel anxious or stressed. Stress and anxiety increase muscle tension and can make back pain worse. Learn ways to manage anxiety and stress, such as through exercise.  General instructions   Sleep on a firm mattress in a comfortable position. Try lying on your side with your knees slightly bent. If you lie on your back, put a pillow under your knees.   Follow your treatment plan as told by your health care provider. This may include:  ? Cognitive or behavioral therapy.  ? Acupuncture or massage therapy.  ? Meditation or yoga.  Contact a health care provider if:   You have pain that is not relieved with rest or medicine.   You have increasing pain going down  into your legs or buttocks.   Your pain does not improve after 2 weeks.   You have pain at night.   You lose weight without trying.   You have a fever or chills.  Get help right away if:   You develop new bowel or bladder control problems.   You have unusual weakness or numbness in your arms or legs.   You develop nausea or vomiting.   You develop abdominal pain.   You feel faint.  Summary   Acute back pain is sudden and usually short-lived.   Use proper lifting techniques. When you bend and lift, use positions that put less stress on your back.   Take over-the-counter and prescription medicines and apply heat or ice as directed by your health care provider.  This information is not intended to replace advice given to you by your health care provider. Make sure you discuss any questions you have with your health care provider.  Document Released: 11/02/2005 Document Revised: 06/09/2018 Document Reviewed: 06/16/2017  Elsevier Interactive Patient Education  2019 [employer]vier Inc[employer]

## 2019-01-30 NOTE — Progress Notes (Signed)
Patient ID: Kristin Delgado, female    DOB: 1956-03-07  Age: 63 y.o. MRN: [id number]    Subjective:  Subjective  HPI Kristin Delgado presents for f/u lipid, prolactin level and bp.  She has been off her lipitor and fenofibrate x few months She fell off the back of her pick up Saturday night and landed on her butt and rolled onto her back on a gravel driveway.  Now she c/o low back and left buttock. No radiation down her leg.   Review of Systems  Constitutional: Negative for appetite change, diaphoresis, fatigue and unexpected weight change.  Eyes: Negative for pain, redness and visual disturbance.  Respiratory: Negative for cough, chest tightness, shortness of breath and wheezing.   Cardiovascular: Negative for chest pain, palpitations and leg swelling.  Endocrine: Negative for cold intolerance, heat intolerance, polydipsia, polyphagia and polyuria.  Genitourinary: Negative for difficulty urinating, dysuria and frequency.  Musculoskeletal: Positive for back pain and gait problem.  Neurological: Negative for dizziness, weakness, light-headedness, numbness and headaches.    History Past Medical History:  Diagnosis Date  . Allergy    seasonal  . Anxiety    closed space like MRI  . Back pain \  . Chest pain 2006   admitted, essentially negative  . Constipation   . Depression   . GERD (gastroesophageal reflux disease)   . Hyperlipidemia   . Hypertension   . Infertility, female   . Joint pain   . Pituitary microadenoma ([place])   . Swallowing difficulty   . 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   Her family history includes Alzheimer's disease in her father; Cancer (age of onset: 19) in her mother; Dementia in her father; Lung cancer in her mother.She reports that she has never smoked. She has never used smokeless tobacco. She reports  that she does not drink alcohol or use drugs.  Current Outpatient Medications on File Prior to Visit  Medication Sig Dispense Refill  . acetaminophen (TYLENOL ARTHRITIS PAIN) 650 MG CR tablet Take 650 mg by mouth every 8 (eight) hours as needed. pain     . Ascorbic Acid (VITAMIN C) 1000 MG tablet Take 1,000 mg by mouth 2 (two) times daily.      [name] b complex vitamins tablet Take 1 tablet by mouth daily.    . bromocriptine (PARLODEL) 2.5 MG tablet TAKE 1 TABLET DAILY 90 tablet 0  . Calcium Polycarbophil (FIBER-CAPS PO) Take by mouth.    [name] CALCIUM-MAGNESIUM-ZINC PO Take by mouth.    . famotidine (PEPCID AC) 10 MG chewable tablet Chew 10 mg by mouth daily.      . ferrous sulfate 325 (65 FE) MG EC tablet Take 325 mg by mouth 3 (three) times daily with meals.    [name] glucosamine-chondroitin 500-400 MG tablet Take 1 tablet by mouth 3 (three) times daily.    . hydrochlorothiazide (HYDRODIURIL) 12.5 MG tablet Take 2 tablets (25 mg total) by mouth daily. 30 tablet 0  . lisinopril (PRINIVIL,ZESTRIL) 10 MG tablet Take 0.5 tablets (5 mg total) by mouth daily. 45 tablet 1  . Multiple Vitamin (MULTIVITAMIN) tablet Take 1 tablet by mouth daily.      . Omega 3-6-9 Fatty Acids (OMEGA 3-6-9 COMPLEX PO) Take by mouth.    . Omega-3 Fatty Acids (FISH OIL) 1200 MG CAPS Take 1 capsule by mouth 2 (  two) times daily.    . polyethylene glycol powder (GLYCOLAX/MIRALAX) powder Take 17 g by mouth daily. 3350 g 0  . Probiotic Product (PROBIOTIC ADVANCED PO) Take by mouth.    . ST JOHNS WORT PO Take by mouth.    . Vitamin D, Ergocalciferol, (DRISDOL) 1.25 MG (50000 UT) CAPS capsule Take 1 capsule (50,000 Units total) by mouth every 7 (seven) days. 4 capsule 0  . atorvastatin (LIPITOR) 20 MG tablet TAKE 1 TABLET DAILY (Patient not taking: Reported on 01/30/2019) 90 tablet 1  . fenofibrate 160 MG tablet TAKE 1 TABLET DAILY (Patient not taking: Reported on 01/30/2019) 90 tablet 1   No current facility-administered medications on  file prior to visit.      Objective:  Objective  Physical Exam Vitals signs and nursing note reviewed.  Constitutional:      Appearance: She is well-developed.  HENT:     Head: Normocephalic and atraumatic.  Eyes:     Conjunctiva/sclera: Conjunctivae normal.  Neck:     Musculoskeletal: Normal range of motion and neck supple.     Thyroid: No thyromegaly.     Vascular: No carotid bruit or JVD.  Cardiovascular:     Rate and Rhythm: Normal rate and regular rhythm.     Heart sounds: Normal heart sounds. No murmur.  Pulmonary:     Effort: Pulmonary effort is normal. No respiratory distress.     Breath sounds: Normal breath sounds. No wheezing or rales.  Chest:     Chest wall: No tenderness.  Neurological:     Mental Status: She is alert and oriented to person, place, and time.    BP 107/80   Pulse 77   Temp 98.2 F (36.8 C) (Oral)   Resp 16   Ht 5\' 3"  (1.6 m)   Wt 172 lb (78 kg)   SpO2 100%   BMI 30.47 kg/m  Wt Readings from Last 3 Encounters:  01/30/19 172 lb (78 kg)  01/23/19 173 lb (78.5 kg)  01/02/19 173 lb (78.5 kg)     Lab Results  Component Value Date   WBC 6.5 01/23/2019   HGB 11.5 01/23/2019   HCT 38.0 01/23/2019   PLT 243 10/12/2018   GLUCOSE 86 01/23/2019   CHOL 237 (H) 01/30/2019   TRIG 180.0 (H) 01/30/2019   HDL 39.60 01/30/2019   LDLDIRECT 83.0 01/28/2017   LDLCALC 161 (H) 01/30/2019   ALT 46 (H) 01/23/2019   AST 31 01/23/2019   NA 143 01/23/2019   K 4.3 01/23/2019   CL 105 01/23/2019   CREATININE 0.59 01/23/2019   BUN 21 01/23/2019   CO2 25 01/23/2019   TSH 1.830 10/12/2018   HGBA1C 5.3 01/23/2019    No results found.   Assessment & Plan:  Plan  I have discontinued Mairen Deavers's Potassium Gluconate and Cholecalciferol. I am also having her maintain her famotidine, multivitamin, vitamin C, acetaminophen, Fish Oil, CALCIUM-MAGNESIUM-ZINC PO, Omega 3-6-9 Fatty Acids (OMEGA 3-6-9 COMPLEX PO), Probiotic Product (PROBIOTIC ADVANCED PO), ST  JOHNS WORT PO, Calcium Polycarbophil (FIBER-CAPS PO), fenofibrate, lisinopril, ferrous sulfate, b complex vitamins, glucosamine-chondroitin, atorvastatin, polyethylene glycol powder, bromocriptine, hydrochlorothiazide, and Vitamin D (Ergocalciferol).  No orders of the defined types were placed in this encounter.   Problem List Items Addressed This Visit      Unprioritized   Acute left-sided low back pain without sciatica    Check xray Can use muscle relaxer otc pain meds prn       Relevant Orders  DG Lumbar Spine Complete (Completed)   DG Sacrum/Coccyx (Completed)   Essential hypertension    Well controlled, no changes to meds. Encouraged heart healthy diet such as the DASH diet and exercise as tolerated.       Hyperlipidemia - Primary    Encouraged heart healthy diet, increase exercise, avoid trans fats, consider a krill oil cap daily       Relevant Orders   Lipid panel (Completed)   Pituitary adenoma ([place])    Check labs       Relevant Orders   Prolactin      Follow-up: Return in about 6 months (around 08/02/2019) for hypertension, hyperlipidemia.  [name], DO

## 2019-01-30 NOTE — Assessment & Plan Note (Signed)
Check labs 

## 2019-01-30 NOTE — Assessment & Plan Note (Signed)
Encouraged heart healthy diet, increase exercise, avoid trans fats, consider a krill oil cap daily 

## 2019-01-30 NOTE — Assessment & Plan Note (Signed)
Well controlled, no changes to meds. Encouraged heart healthy diet such as the DASH diet and exercise as tolerated.  °

## 2019-01-30 NOTE — Assessment & Plan Note (Signed)
Check xray Can use muscle relaxer otc pain meds prn

## 2019-01-31 ENCOUNTER — Encounter: Payer: Self-pay | Admitting: *Deleted

## 2019-01-31 ENCOUNTER — Other Ambulatory Visit: Payer: Self-pay | Admitting: Family Medicine

## 2019-01-31 DIAGNOSIS — E785 Hyperlipidemia, unspecified: Secondary | ICD-10-CM

## 2019-01-31 LAB — PROLACTIN: Prolactin: 9.5 ng/mL

## 2019-02-03 ENCOUNTER — Ambulatory Visit (HOSPITAL_COMMUNITY): Admission: RE | Admit: 2019-02-03 | Payer: Medicare Other | Source: Ambulatory Visit

## 2019-02-07 ENCOUNTER — Encounter (INDEPENDENT_AMBULATORY_CARE_PROVIDER_SITE_OTHER): Payer: Self-pay

## 2019-02-08 ENCOUNTER — Telehealth: Payer: Self-pay | Admitting: Family Medicine

## 2019-02-08 ENCOUNTER — Other Ambulatory Visit: Payer: Self-pay | Admitting: Family Medicine

## 2019-02-08 DIAGNOSIS — D352 Benign neoplasm of pituitary gland: Secondary | ICD-10-CM

## 2019-02-08 DIAGNOSIS — I10 Essential (primary) hypertension: Secondary | ICD-10-CM

## 2019-02-08 DIAGNOSIS — E785 Hyperlipidemia, unspecified: Secondary | ICD-10-CM

## 2019-02-08 MED ORDER — BROMOCRIPTINE MESYLATE 2.5 MG PO TABS: 2.5000 mg | ORAL_TABLET | Freq: Every day | ORAL | 0 refills | Status: DC

## 2019-02-08 MED ORDER — LISINOPRIL 10 MG PO TABS: 5.0000 mg | ORAL_TABLET | Freq: Every day | ORAL | 1 refills | Status: DC

## 2019-02-08 MED ORDER — HYDROCHLOROTHIAZIDE 12.5 MG PO TABS: 25.0000 mg | ORAL_TABLET | Freq: Every day | ORAL | 0 refills | Status: DC

## 2019-02-08 MED ORDER — ATORVASTATIN CALCIUM 20 MG PO TABS: 20.0000 mg | ORAL_TABLET | Freq: Every day | ORAL | 1 refills | Status: DC

## 2019-02-08 NOTE — Telephone Encounter (Signed)
Refills sent

## 2019-02-08 NOTE — Telephone Encounter (Signed)
Copied from [place] [phone]. Topic: Quick Communication - Rx Refill/Question >> Feb 08, 2019  1:08 PM Rayann Heman wrote: Medication: pt called and stated that her and dr Etter Sjogren discussed a medication called Lipitor but was not called in. Pt also states that she needs a refill   hydrochlorothiazide (HYDRODIURIL) 12.5 MG tablet [[id number]] bromocriptine (PARLODEL) 2.5 MG tablet [[id number]] lisinopril (PRINIVIL,ZESTRIL) 10 MG tablet [[id number]]   Has the patient contacted their pharmacy? no Preferred Pharmacy (with phone number or street name):[id number] [address], [place] [phone] (Phone) [phone] (Fax)    Agent: Please be advised that RX refills may take up to 3 business days. We ask that you follow-up with your pharmacy.

## 2019-02-08 NOTE — Telephone Encounter (Signed)
Please advise 

## 2019-02-08 NOTE — Telephone Encounter (Signed)
Copied from [place] [id number]. Topic: Quick Communication - See Telephone Encounter >> Feb 08, 2019  1:14 PM Rayann Heman wrote: CRM for notification. See Telephone encounter for: 02/08/19. Pt called and stated that [place] canceled MRI and would like to know what she should do now. Pt states that she is still having pain when she is sitting. Pt would like a call back from the nurse. Please advise.

## 2019-02-08 NOTE — Telephone Encounter (Signed)
They said they would reschedule?   Because of the coronavirus all elective tests are being postponed There really isn/t any thing we can do until this blows over I can refer to ortho ---- they may be able to get it done in a mobile unit. If she would like to do that please put in ortho referral --- dx back pain

## 2019-02-09 ENCOUNTER — Other Ambulatory Visit: Payer: Self-pay

## 2019-02-09 ENCOUNTER — Encounter (INDEPENDENT_AMBULATORY_CARE_PROVIDER_SITE_OTHER): Payer: Self-pay | Admitting: Family Medicine

## 2019-02-09 ENCOUNTER — Ambulatory Visit (INDEPENDENT_AMBULATORY_CARE_PROVIDER_SITE_OTHER): Payer: Medicare Other | Admitting: Family Medicine

## 2019-02-09 DIAGNOSIS — E669 Obesity, unspecified: Secondary | ICD-10-CM | POA: Diagnosis not present

## 2019-02-09 DIAGNOSIS — E785 Hyperlipidemia, unspecified: Secondary | ICD-10-CM

## 2019-02-09 DIAGNOSIS — E559 Vitamin D deficiency, unspecified: Secondary | ICD-10-CM

## 2019-02-09 DIAGNOSIS — Z6830 Body mass index (BMI) 30.0-30.9, adult: Secondary | ICD-10-CM

## 2019-02-13 NOTE — Progress Notes (Signed)
Office: [phone]  /  Fax: [phone] TeleHealth Visit:  Kristin Delgado has consented to this TeleHealth visit today via telephone call via Skype. The patient is located at home, the provider is located at the [facility] office. The participants in this visit include the listed provider and patient and provider's assistant.   HPI:   Chief Complaint: OBESITY Kristin Delgado is here to discuss her progress with her obesity treatment plan. She is on the keep a food journal with 1250 calories and 85+ grams of protein daily and is following her eating plan approximately 90 % of the time. She states she is walking 5,000 steps daily. Kristin Delgado is still working at [employer] and had to take last week off secondary to a fall, and sustaining a compression fracture of the spine. She has gone between calories/protein goals and Category 2 plan. She is reaching protein goals consistently. She has had some trouble finding some of the food in the grocery store.  We were unable to weight the patient today for this TeleHealth visit. She feels as if she has lost 3-4 lbs since her last visit. She has lost 16-20 lbs since starting treatment with [name].  Hyperlipidemia Kristin Delgado has hyperlipidemia and has been trying to improve her cholesterol levels with intensive lifestyle modification including a low saturated fat diet, exercise and weight loss. She hasn't been taking Lipitor secondary to difficulty getting prescription refilled at [facility]. She denies any chest pain, claudication or myalgias.  Vitamin D Deficiency Kristin Delgado has a diagnosis of vitamin D deficiency. She is currently taking prescription Vit D. She notes fatigue and denies nausea, vomiting or muscle weakness.  ASSESSMENT AND PLAN:  Hyperlipidemia, unspecified hyperlipidemia type  Vitamin D deficiency  Class 1 obesity with serious comorbidity and body mass index (BMI) of 30.0 to 30.9 in adult, unspecified obesity type  PLAN:   Hyperlipidemia Kristin Delgado was informed of the American Heart Association Guidelines emphasizing intensive lifestyle modifications as the first line treatment for hyperlipidemia. We discussed many lifestyle modifications today in depth, and Kristin Delgado will continue to work on decreasing saturated fats such as fatty red meat, butter and many fried foods. She will also increase vegetables and lean protein in her diet and continue to work on exercise and weight loss efforts. We will recheck FLP in 3 months after taking Lipitor consistently. Kristin Delgado agrees to follow up with our clinic in 2 weeks.  Vitamin D Deficiency Kristin Delgado was informed that low vitamin D levels contributes to fatigue and are associated with obesity, breast, and colon cancer. Kristin Delgado is to stop prescription Vit D and she agrees to start OTC Vit D 2,000 IU daily. She will follow up for routine testing of vitamin D, at least 2-3 times per year. She was informed of the risk of over-replacement of vitamin D and agrees to not increase her dose unless she discusses this with [name] first. Kristin Delgado agrees to follow up with our clinic in 2 weeks.  Obesity Kristin Delgado is currently in the action stage of change. As such, her goal is to continue with weight loss efforts She has agreed to keep a food journal with 1250 calories and 85+ grams of protein daily or follow the Category 2 plan Kristin Delgado has been instructed to work up to a goal of 150 minutes of combined cardio and strengthening exercise per week for weight loss and overall health benefits. We discussed the following Behavioral Modification Strategies today: increasing lean protein intake, increasing vegetables, work on meal planning and easy cooking  plans, and planning for success   Kristin Delgado has agreed to follow up with our clinic in 2 weeks. She was informed of the importance of frequent follow up visits to maximize her success with intensive lifestyle modifications for her multiple health conditions.   ALLERGIES: No Known Allergies  MEDICATIONS: Current Outpatient Medications on File Prior to Visit  Medication Sig Dispense Refill  . acetaminophen (TYLENOL ARTHRITIS PAIN) 650 MG CR tablet Take 650 mg by mouth every 8 (eight) hours as needed. pain     . Ascorbic Acid (VITAMIN C) 1000 MG tablet Take 1,000 mg by mouth 2 (two) times daily.      [name] atorvastatin (LIPITOR) 20 MG tablet Take 1 tablet (20 mg total) by mouth daily. 90 tablet 1  . b complex vitamins tablet Take 1 tablet by mouth daily.    . bromocriptine (PARLODEL) 2.5 MG tablet Take 1 tablet (2.5 mg total) by mouth daily. 90 tablet 0  . Calcium Polycarbophil (FIBER-CAPS PO) Take by mouth.    [name] CALCIUM-MAGNESIUM-ZINC PO Take by mouth.    . famotidine (PEPCID AC) 10 MG chewable tablet Chew 10 mg by mouth daily.      . fenofibrate 160 MG tablet TAKE 1 TABLET DAILY (Patient not taking: Reported on 01/30/2019) 90 tablet 1  . ferrous sulfate 325 (65 FE) MG EC tablet Take 325 mg by mouth 3 (three) times daily with meals.    [name] glucosamine-chondroitin 500-400 MG tablet Take 1 tablet by mouth 3 (three) times daily.    . hydrochlorothiazide (HYDRODIURIL) 12.5 MG tablet Take 2 tablets (25 mg total) by mouth daily. 30 tablet 0  . lisinopril (PRINIVIL,ZESTRIL) 10 MG tablet Take 0.5 tablets (5 mg total) by mouth daily. 45 tablet 1  . Multiple Vitamin (MULTIVITAMIN) tablet Take 1 tablet by mouth daily.      . Omega 3-6-9 Fatty Acids (OMEGA 3-6-9 COMPLEX PO) Take by mouth.    . Omega-3 Fatty Acids (FISH OIL) 1200 MG CAPS Take 1 capsule by mouth 2 (two) times daily.    . polyethylene glycol powder (GLYCOLAX/MIRALAX) powder Take 17 g by mouth daily. 3350 g 0  . Probiotic Product (PROBIOTIC ADVANCED PO) Take by mouth.    . ST JOHNS WORT PO Take by mouth.     No current facility-administered medications on file prior to visit.     PAST MEDICAL HISTORY: Past Medical History:  Diagnosis Date  . Allergy    seasonal  . Anxiety    closed space like  MRI  . Back pain \  . Chest pain 2006   admitted, essentially negative  . Constipation   . Depression   . GERD (gastroesophageal reflux disease)   . Hyperlipidemia   . Hypertension   . Infertility, female   . Joint pain   . Pituitary microadenoma ([place])   . Swallowing difficulty   . 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 Vitamin D deficiency     PAST SURGICAL HISTORY: Past Surgical History:  Procedure Laterality Date  . 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 BRAIN SURGERY  1 / 1985   microadenoma  . COCHLEAR IMPLANT  02/2013   Left side    SOCIAL HISTORY: Social History   Tobacco Use  . Smoking status: Never Smoker  . Smokeless tobacco: Never Used  Substance Use Topics  . Alcohol use: No    Comment: rare  . Drug use: No    FAMILY HISTORY: Family History  Problem Relation Age of  Onset  . Lung cancer Mother   . Cancer Mother 16       lung   . Alzheimer's disease Father   . Dementia Father     ROS: Review of Systems  Constitutional: Positive for malaise/fatigue and weight loss.  Cardiovascular: Negative for chest pain and claudication.  Gastrointestinal: Negative for nausea and vomiting.  Musculoskeletal: Negative for myalgias.       Negative muscle weakness    PHYSICAL EXAM: Pt in no acute distress  RECENT LABS AND TESTS: BMET    Component Value Date/Time   NA 143 01/23/2019 1518   K 4.3 01/23/2019 1518   CL 105 01/23/2019 1518   CO2 25 01/23/2019 1518   GLUCOSE 86 01/23/2019 1518   GLUCOSE 110 (H) 08/01/2018 0932   BUN 21 01/23/2019 1518   CREATININE 0.59 01/23/2019 1518   CALCIUM 9.5 01/23/2019 1518   GFRNONAA 97 01/23/2019 1518   GFRAA 112 01/23/2019 1518   Lab Results  Component Value Date   HGBA1C 5.3 01/23/2019   HGBA1C 5.6 10/12/2018   HGBA1C 6.1 01/28/2017   Lab Results  Component Value Date   INSULIN 6.2 01/23/2019   INSULIN 5.9 10/12/2018   CBC    Component Value Date/Time   WBC 6.5 01/23/2019 1518   WBC 6.4 02/08/2018 1125    RBC 4.61 01/23/2019 1518   RBC 4.21 02/08/2018 1125   HGB 11.5 01/23/2019 1518   HCT 38.0 01/23/2019 1518   PLT 243 10/12/2018 1325   MCV 82 01/23/2019 1518   MCH 24.9 (L) 01/23/2019 1518   MCH 26.4 (L) 02/08/2018 1125   MCHC 30.3 (L) 01/23/2019 1518   MCHC 33.2 02/08/2018 1125   RDW 14.4 01/23/2019 1518   LYMPHSABS 2.1 01/23/2019 1518   MONOABS 0.4 07/26/2017 1028   EOSABS 0.1 01/23/2019 1518   BASOSABS 0.0 01/23/2019 1518   Iron/TIBC/Ferritin/ %Sat    Component Value Date/Time   IRON 52 12/12/2007 1500   IRONPCTSAT 14.6 (L) 12/12/2007 1500   Lipid Panel     Component Value Date/Time   CHOL 237 (H) 01/30/2019 0932   TRIG 180.0 (H) 01/30/2019 0932   HDL 39.60 01/30/2019 0932   CHOLHDL 6 01/30/2019 0932   VLDL 36.0 01/30/2019 0932   LDLCALC 161 (H) 01/30/2019 0932   LDLDIRECT 83.0 01/28/2017 1011   Hepatic Function Panel     Component Value Date/Time   PROT 6.3 01/23/2019 1518   ALBUMIN 4.2 01/23/2019 1518   AST 31 01/23/2019 1518   ALT 46 (H) 01/23/2019 1518   ALKPHOS 59 01/23/2019 1518   BILITOT 0.2 01/23/2019 1518   BILIDIR 0.0 05/02/2015 0823      Component Value Date/Time   TSH 1.830 10/12/2018 1325   TSH 2.58 04/06/2016 1408   TSH 2.86 07/16/2014 1051      I, Trixie Dredge, am acting as Location manager for Ilene Qua, MD  I have reviewed the above documentation for accuracy and completeness, and I agree with the above. - Ilene Qua, MD

## 2019-02-13 NOTE — Telephone Encounter (Signed)
Patient stated that she was a little better just can not sit for more than a few minutes.  She will try and wait for MRI but if not she will call and let us know so we can put in referral.

## 2019-02-14 ENCOUNTER — Encounter (INDEPENDENT_AMBULATORY_CARE_PROVIDER_SITE_OTHER): Payer: Self-pay

## 2019-02-22 ENCOUNTER — Encounter (INDEPENDENT_AMBULATORY_CARE_PROVIDER_SITE_OTHER): Payer: Self-pay | Admitting: Family Medicine

## 2019-02-22 ENCOUNTER — Ambulatory Visit (INDEPENDENT_AMBULATORY_CARE_PROVIDER_SITE_OTHER): Payer: Medicare Other | Admitting: Family Medicine

## 2019-02-22 ENCOUNTER — Other Ambulatory Visit: Payer: Self-pay

## 2019-02-22 DIAGNOSIS — E669 Obesity, unspecified: Secondary | ICD-10-CM

## 2019-02-22 DIAGNOSIS — R7303 Prediabetes: Secondary | ICD-10-CM

## 2019-02-22 DIAGNOSIS — I10 Essential (primary) hypertension: Secondary | ICD-10-CM | POA: Diagnosis not present

## 2019-02-22 DIAGNOSIS — Z6830 Body mass index (BMI) 30.0-30.9, adult: Secondary | ICD-10-CM

## 2019-02-22 MED ORDER — HYDROCHLOROTHIAZIDE 12.5 MG PO TABS: 25.0000 mg | ORAL_TABLET | Freq: Every day | ORAL | 0 refills | Status: DC

## 2019-02-22 NOTE — Progress Notes (Signed)
Office: [phone]  /  Fax: [phone] TeleHealth Visit:  Kristin Delgado has verbally consented to this TeleHealth visit today. The patient is located at home, the provider is located at the [facility] office. The participants in this visit include the listed provider and patient and provider's assistant. The visit was conducted today via webex.  HPI:   Chief Complaint: OBESITY Kristin Delgado is here to discuss her progress with her obesity treatment plan. She is on the keep a food journal with 1250 calories and 85+ grams of protein daily or follow the Category 2 plan and is following her eating plan approximately 90 % of the time. She states she is walking 4,000-5,000 steps 7 times per week. Kristin Delgado is still working, same hours but doing different work. Her daughters are at home and she has had some increase in stress. She is finding going to work to help control her stress. She has been going to the store for groceries.  We were unable to weigh the patient today for this TeleHealth visit. She feels as if she has lost 2-3 lbs since her last visit. She has lost 16-19 lbs since starting treatment with [name].  Hypertension Kristin Delgado is a 63 y.o. female with hypertension. Kristin Delgado's blood pressure was controlled at last appointment. She denies chest pain, chest pressure, headaches, dizziness, or lightheadedness. She is working on weight loss to help control her blood pressure with the goal of decreasing her risk of heart attack and stroke.   Pre-Diabetes Kristin Delgado has a diagnosis of pre-diabetes based on her elevated Hgb A1c and was informed this puts her at greater risk of developing diabetes. She is not taking metformin currently and notes minimal carbohydrate cravings. She continues to work on diet and exercise to decrease risk of diabetes. She denies nausea or hypoglycemia.  ASSESSMENT AND PLAN:  Essential hypertension - Plan: hydrochlorothiazide (HYDRODIURIL) 12.5 MG tablet   Prediabetes  Class 1 obesity with serious comorbidity and body mass index (BMI) of 30.0 to 30.9 in adult, unspecified obesity type  PLAN:  Hypertension We discussed sodium restriction, working on healthy weight loss, and a regular exercise program as the means to achieve improved blood pressure control. Kristin Delgado agreed with this plan and agreed to follow up as directed. We will continue to monitor her blood pressure as well as her progress with the above lifestyle modifications. Kristin Delgado agrees to continue taking hydrochlorothiazide 25 mg 1 tablet PO #30 and we will refill for 1 month. She will watch for signs of hypotension as she continues her lifestyle modifications. Kristin Delgado agrees to follow up with our clinic in 2 weeks.  Pre-Diabetes Kristin Delgado will continue to work on weight loss, exercise, and decreasing simple carbohydrates in her diet to help decrease the risk of diabetes. We dicussed metformin including benefits and risks. She was informed that eating too many simple carbohydrates or too many calories at one sitting increases the likelihood of GI side effects. Kristin Delgado declined metformin for now and a prescription was not written today. We will repeat labs in mid June. Kristin Delgado agrees to follow up with our clinic in 2 weeks as directed to monitor her progress.  Obesity Kristin Delgado is currently in the action stage of change. As such, her goal is to continue with weight loss efforts She has agreed to keep a food journal with 1100-1200 calories and 75+ grams of protein daily or follow the Category 2 plan Kristin Delgado has been instructed to work up to a goal of 150 minutes of  combined cardio and strengthening exercise per week for weight loss and overall health benefits. We discussed the following Behavioral Modification Strategies today: increasing lean protein intake, increasing vegetables, work on meal planning and easy cooking plans, better snacking choices, and avoiding temptations   Kristin Delgado has  agreed to follow up with our clinic in 2 weeks. She was informed of the importance of frequent follow up visits to maximize her success with intensive lifestyle modifications for her multiple health conditions.  ALLERGIES: No Known Allergies  MEDICATIONS: Current Outpatient Medications on File Prior to Visit  Medication Sig Dispense Refill  . acetaminophen (TYLENOL ARTHRITIS PAIN) 650 MG CR tablet Take 650 mg by mouth every 8 (eight) hours as needed. pain     . Ascorbic Acid (VITAMIN C) 1000 MG tablet Take 1,000 mg by mouth 2 (two) times daily.      [name] atorvastatin (LIPITOR) 20 MG tablet Take 1 tablet (20 mg total) by mouth daily. 90 tablet 1  . b complex vitamins tablet Take 1 tablet by mouth daily.    . bromocriptine (PARLODEL) 2.5 MG tablet Take 1 tablet (2.5 mg total) by mouth daily. 90 tablet 0  . Calcium Polycarbophil (FIBER-CAPS PO) Take by mouth.    [name] CALCIUM-MAGNESIUM-ZINC PO Take by mouth.    . famotidine (PEPCID AC) 10 MG chewable tablet Chew 10 mg by mouth daily.      . fenofibrate 160 MG tablet TAKE 1 TABLET DAILY (Patient not taking: Reported on 01/30/2019) 90 tablet 1  . ferrous sulfate 325 (65 FE) MG EC tablet Take 325 mg by mouth 3 (three) times daily with meals.    [name] glucosamine-chondroitin 500-400 MG tablet Take 1 tablet by mouth 3 (three) times daily.    [name] lisinopril (PRINIVIL,ZESTRIL) 10 MG tablet Take 0.5 tablets (5 mg total) by mouth daily. 45 tablet 1  . Multiple Vitamin (MULTIVITAMIN) tablet Take 1 tablet by mouth daily.      . Omega 3-6-9 Fatty Acids (OMEGA 3-6-9 COMPLEX PO) Take by mouth.    . Omega-3 Fatty Acids (FISH OIL) 1200 MG CAPS Take 1 capsule by mouth 2 (two) times daily.    . polyethylene glycol powder (GLYCOLAX/MIRALAX) powder Take 17 g by mouth daily. 3350 g 0  . Probiotic Product (PROBIOTIC ADVANCED PO) Take by mouth.    . ST JOHNS WORT PO Take by mouth.     No current facility-administered medications on file prior to visit.     PAST MEDICAL  HISTORY: Past Medical History:  Diagnosis Date  . Allergy    seasonal  . Anxiety    closed space like MRI  . Back pain \  . Chest pain 2006   admitted, essentially negative  . Constipation   . Depression   . GERD (gastroesophageal reflux disease)   . Hyperlipidemia   . Hypertension   . Infertility, female   . Joint pain   . Pituitary microadenoma ([place])   . Swallowing difficulty   . 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 Vitamin D deficiency     PAST SURGICAL HISTORY: Past Surgical History:  Procedure Laterality Date  . 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 BRAIN SURGERY  1 / 1985   microadenoma  . COCHLEAR IMPLANT  02/2013   Left side    SOCIAL HISTORY: Social History   Tobacco Use  . Smoking status: Never Smoker  . Smokeless tobacco: Never Used  Substance Use Topics  . Alcohol use: No    Comment:  rare  . Drug use: No    FAMILY HISTORY: Family History  Problem Relation Age of Onset  . Lung cancer Mother   . Cancer Mother 8       lung   . Alzheimer's disease Father   . Dementia Father     ROS: Review of Systems  Constitutional: Positive for weight loss.  Cardiovascular: Negative for chest pain.       Negative chest pressure  Gastrointestinal: Negative for nausea.  Neurological: Negative for dizziness and headaches.       Negative lightheadedness  Endo/Heme/Allergies:       Negative hypoglycemia    PHYSICAL EXAM: Pt in no acute distress  RECENT LABS AND TESTS: BMET    Component Value Date/Time   NA 143 01/23/2019 1518   K 4.3 01/23/2019 1518   CL 105 01/23/2019 1518   CO2 25 01/23/2019 1518   GLUCOSE 86 01/23/2019 1518   GLUCOSE 110 (H) 08/01/2018 0932   BUN 21 01/23/2019 1518   CREATININE 0.59 01/23/2019 1518   CALCIUM 9.5 01/23/2019 1518   GFRNONAA 97 01/23/2019 1518   GFRAA 112 01/23/2019 1518   Lab Results  Component Value Date   HGBA1C 5.3 01/23/2019   HGBA1C 5.6 10/12/2018   HGBA1C 6.1 01/28/2017   Lab Results  Component Value  Date   INSULIN 6.2 01/23/2019   INSULIN 5.9 10/12/2018   CBC    Component Value Date/Time   WBC 6.5 01/23/2019 1518   WBC 6.4 02/08/2018 1125   RBC 4.61 01/23/2019 1518   RBC 4.21 02/08/2018 1125   HGB 11.5 01/23/2019 1518   HCT 38.0 01/23/2019 1518   PLT 243 10/12/2018 1325   MCV 82 01/23/2019 1518   MCH 24.9 (L) 01/23/2019 1518   MCH 26.4 (L) 02/08/2018 1125   MCHC 30.3 (L) 01/23/2019 1518   MCHC 33.2 02/08/2018 1125   RDW 14.4 01/23/2019 1518   LYMPHSABS 2.1 01/23/2019 1518   MONOABS 0.4 07/26/2017 1028   EOSABS 0.1 01/23/2019 1518   BASOSABS 0.0 01/23/2019 1518   Iron/TIBC/Ferritin/ %Sat    Component Value Date/Time   IRON 52 12/12/2007 1500   IRONPCTSAT 14.6 (L) 12/12/2007 1500   Lipid Panel     Component Value Date/Time   CHOL 237 (H) 01/30/2019 0932   TRIG 180.0 (H) 01/30/2019 0932   HDL 39.60 01/30/2019 0932   CHOLHDL 6 01/30/2019 0932   VLDL 36.0 01/30/2019 0932   LDLCALC 161 (H) 01/30/2019 0932   LDLDIRECT 83.0 01/28/2017 1011   Hepatic Function Panel     Component Value Date/Time   PROT 6.3 01/23/2019 1518   ALBUMIN 4.2 01/23/2019 1518   AST 31 01/23/2019 1518   ALT 46 (H) 01/23/2019 1518   ALKPHOS 59 01/23/2019 1518   BILITOT 0.2 01/23/2019 1518   BILIDIR 0.0 05/02/2015 0823      Component Value Date/Time   TSH 1.830 10/12/2018 1325   TSH 2.58 04/06/2016 1408   TSH 2.86 07/16/2014 1051      I, Trixie Dredge, am acting as Location manager for Ilene Qua, MD  I have reviewed the above documentation for accuracy and completeness, and I agree with the above. - Ilene Qua, MD

## 2019-03-08 ENCOUNTER — Encounter (INDEPENDENT_AMBULATORY_CARE_PROVIDER_SITE_OTHER): Payer: Self-pay | Admitting: Family Medicine

## 2019-03-08 ENCOUNTER — Other Ambulatory Visit: Payer: Self-pay

## 2019-03-08 ENCOUNTER — Ambulatory Visit (INDEPENDENT_AMBULATORY_CARE_PROVIDER_SITE_OTHER): Payer: Medicare Other | Admitting: Family Medicine

## 2019-03-08 DIAGNOSIS — R7303 Prediabetes: Secondary | ICD-10-CM | POA: Diagnosis not present

## 2019-03-08 DIAGNOSIS — I10 Essential (primary) hypertension: Secondary | ICD-10-CM | POA: Diagnosis not present

## 2019-03-08 DIAGNOSIS — Z6830 Body mass index (BMI) 30.0-30.9, adult: Secondary | ICD-10-CM

## 2019-03-08 DIAGNOSIS — E669 Obesity, unspecified: Secondary | ICD-10-CM

## 2019-03-08 MED ORDER — HYDROCHLOROTHIAZIDE 12.5 MG PO TABS: 25.0000 mg | ORAL_TABLET | Freq: Every day | ORAL | 0 refills | Status: DC

## 2019-03-13 NOTE — Progress Notes (Signed)
Office: [phone]  /  Fax: [phone] TeleHealth Visit:  Kristin Delgado has verbally consented to this TeleHealth visit today. The patient is located at home, the provider is located at the [facility] office. The participants in this visit include the listed provider and patient and patient's husband. The visit was conducted today via webex.  HPI:   Chief Complaint: OBESITY Kristin Delgado is here to discuss her progress with her obesity treatment plan. She is on the keep a food journal with 1100-1200 calories and 75+ grams of protein daily or follow the Category 2 plan and is following her eating plan approximately 75 % of the time. She states she is standing at work for 6 hours 4 times per week. Kristin Delgado has still been going to work. She notes it is still slow compared to prior to pandemic. The past week, she has struggled to follow the plan and was eating whatever is most convenient mostly for breakfast. She thinks she was developing an allergy to yogurt.  We were unable to weigh the patient today for this TeleHealth visit. She feels as if she has maintained her weight since her last visit. She has lost 16 lbs since starting treatment with [name].  Pre-Diabetes Kristin Delgado has a diagnosis of pre-diabetes based on her elevated Hgb A1c of 5.3 in early March, and was informed this puts her at greater risk of developing diabetes. She is not taking metformin currently and notes minimal carbohydrate cravings. She continues to work on diet and exercise to decrease risk of diabetes. She denies nausea or hypoglycemia.   Hypertension Kristin Delgado is a 63 y.o. female with hypertension. Kristin Delgado is on hydrochlorothiazide. She denies chest pain, chest pressure, dizziness, or lightheadedness. She is working on weight loss to help control her blood pressure with the goal of decreasing her risk of heart attack and stroke.   ASSESSMENT AND PLAN:  Essential hypertension - Plan: hydrochlorothiazide  (HYDRODIURIL) 12.5 MG tablet  Prediabetes  Class 1 obesity with serious comorbidity and body mass index (BMI) of 30.0 to 30.9 in adult, unspecified obesity type  PLAN:  Pre-Diabetes Kristin Delgado will continue her Category 2 plan, and will continue to work on weight loss, exercise, and decreasing simple carbohydrates in her diet to help decrease the risk of diabetes. We dicussed metformin including benefits and risks. She was informed that eating too many simple carbohydrates or too many calories at one sitting increases the likelihood of GI side effects. Kristin Delgado declined metformin for now and a prescription was not written today. We will retest Hgb A1c and insulin in mid June. Kristin Delgado agrees to follow up with our clinic in 2 weeks as directed to monitor her progress.  Hypertension We discussed sodium restriction, working on healthy weight loss, and a regular exercise program as the means to achieve improved blood pressure control. Kristin Delgado agreed with this plan and agreed to follow up as directed. We will continue to monitor her blood pressure as well as her progress with the above lifestyle modifications. Kristin Delgado agrees to continue taking hydrochlorothiazide 12.5 mg PO daily #30 and we will refill for 1 month. She will watch for signs of hypotension as she continues her lifestyle modifications. Kristin Delgado agrees to follow up with our clinic in 2 weeks.  Obesity Kristin Delgado is currently in the action stage of change. As such, her goal is to continue with weight loss efforts She has agreed to keep a food journal with 250-350 calories and 20+ grams of protein at breakfast daily and  follow the Category 2 plan Kristin Delgado has been instructed to work up to a goal of 150 minutes of combined cardio and strengthening exercise per week for weight loss and overall health benefits. We discussed the following Behavioral Modification Strategies today: increasing lean protein intake, increasing vegetables, work on meal planning  and easy cooking plans, ways to avoid boredom eating, no skipping meals, and planning for success   Kristin Delgado has agreed to follow up with our clinic in 2 weeks. She was informed of the importance of frequent follow up visits to maximize her success with intensive lifestyle modifications for her multiple health conditions.  ALLERGIES: No Known Allergies  MEDICATIONS: Current Outpatient Medications on File Prior to Visit  Medication Sig Dispense Refill  . acetaminophen (TYLENOL ARTHRITIS PAIN) 650 MG CR tablet Take 650 mg by mouth every 8 (eight) hours as needed. pain     . Ascorbic Acid (VITAMIN C) 1000 MG tablet Take 1,000 mg by mouth 2 (two) times daily.      [name] atorvastatin (LIPITOR) 20 MG tablet Take 1 tablet (20 mg total) by mouth daily. 90 tablet 1  . b complex vitamins tablet Take 1 tablet by mouth daily.    . bromocriptine (PARLODEL) 2.5 MG tablet Take 1 tablet (2.5 mg total) by mouth daily. 90 tablet 0  . Calcium Polycarbophil (FIBER-CAPS PO) Take by mouth.    [name] CALCIUM-MAGNESIUM-ZINC PO Take by mouth.    . famotidine (PEPCID AC) 10 MG chewable tablet Chew 10 mg by mouth daily.      . fenofibrate 160 MG tablet TAKE 1 TABLET DAILY (Patient not taking: Reported on 01/30/2019) 90 tablet 1  . ferrous sulfate 325 (65 FE) MG EC tablet Take 325 mg by mouth 3 (three) times daily with meals.    [name] glucosamine-chondroitin 500-400 MG tablet Take 1 tablet by mouth 3 (three) times daily.    [name] lisinopril (PRINIVIL,ZESTRIL) 10 MG tablet Take 0.5 tablets (5 mg total) by mouth daily. 45 tablet 1  . Multiple Vitamin (MULTIVITAMIN) tablet Take 1 tablet by mouth daily.      . Omega 3-6-9 Fatty Acids (OMEGA 3-6-9 COMPLEX PO) Take by mouth.    . Omega-3 Fatty Acids (FISH OIL) 1200 MG CAPS Take 1 capsule by mouth 2 (two) times daily.    . polyethylene glycol powder (GLYCOLAX/MIRALAX) powder Take 17 g by mouth daily. 3350 g 0  . Probiotic Product (PROBIOTIC ADVANCED PO) Take by mouth.    . ST JOHNS WORT PO  Take by mouth.     No current facility-administered medications on file prior to visit.     PAST MEDICAL HISTORY: Past Medical History:  Diagnosis Date  . Allergy    seasonal  . Anxiety    closed space like MRI  . Back pain \  . Chest pain 2006   admitted, essentially negative  . Constipation   . Depression   . GERD (gastroesophageal reflux disease)   . Hyperlipidemia   . Hypertension   . Infertility, female   . Joint pain   . Pituitary microadenoma ([place])   . Swallowing difficulty   . 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 Vitamin D deficiency     PAST SURGICAL HISTORY: Past Surgical History:  Procedure Laterality Date  . 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 BRAIN SURGERY  1 / 1985   microadenoma  . COCHLEAR IMPLANT  02/2013   Left side    SOCIAL HISTORY: Social History   Tobacco Use  .  Smoking status: Never Smoker  . Smokeless tobacco: Never Used  Substance Use Topics  . Alcohol use: No    Comment: rare  . Drug use: No    FAMILY HISTORY: Family History  Problem Relation Age of Onset  . Lung cancer Mother   . Cancer Mother 29       lung   . Alzheimer's disease Father   . Dementia Father     ROS: Review of Systems  Constitutional: Negative for weight loss.  Cardiovascular: Negative for chest pain.       Negative chest pressure  Gastrointestinal: Negative for nausea.  Neurological: Negative for dizziness.       Negative lightheadedness  Endo/Heme/Allergies:       Negative hypoglycemia    PHYSICAL EXAM: Pt in no acute distress  RECENT LABS AND TESTS: BMET    Component Value Date/Time   NA 143 01/23/2019 1518   K 4.3 01/23/2019 1518   CL 105 01/23/2019 1518   CO2 25 01/23/2019 1518   GLUCOSE 86 01/23/2019 1518   GLUCOSE 110 (H) 08/01/2018 0932   BUN 21 01/23/2019 1518   CREATININE 0.59 01/23/2019 1518   CALCIUM 9.5 01/23/2019 1518   GFRNONAA 97 01/23/2019 1518   GFRAA 112 01/23/2019 1518   Lab Results  Component Value Date   HGBA1C  5.3 01/23/2019   HGBA1C 5.6 10/12/2018   HGBA1C 6.1 01/28/2017   Lab Results  Component Value Date   INSULIN 6.2 01/23/2019   INSULIN 5.9 10/12/2018   CBC    Component Value Date/Time   WBC 6.5 01/23/2019 1518   WBC 6.4 02/08/2018 1125   RBC 4.61 01/23/2019 1518   RBC 4.21 02/08/2018 1125   HGB 11.5 01/23/2019 1518   HCT 38.0 01/23/2019 1518   PLT 243 10/12/2018 1325   MCV 82 01/23/2019 1518   MCH 24.9 (L) 01/23/2019 1518   MCH 26.4 (L) 02/08/2018 1125   MCHC 30.3 (L) 01/23/2019 1518   MCHC 33.2 02/08/2018 1125   RDW 14.4 01/23/2019 1518   LYMPHSABS 2.1 01/23/2019 1518   MONOABS 0.4 07/26/2017 1028   EOSABS 0.1 01/23/2019 1518   BASOSABS 0.0 01/23/2019 1518   Iron/TIBC/Ferritin/ %Sat    Component Value Date/Time   IRON 52 12/12/2007 1500   IRONPCTSAT 14.6 (L) 12/12/2007 1500   Lipid Panel     Component Value Date/Time   CHOL 237 (H) 01/30/2019 0932   TRIG 180.0 (H) 01/30/2019 0932   HDL 39.60 01/30/2019 0932   CHOLHDL 6 01/30/2019 0932   VLDL 36.0 01/30/2019 0932   LDLCALC 161 (H) 01/30/2019 0932   LDLDIRECT 83.0 01/28/2017 1011   Hepatic Function Panel     Component Value Date/Time   PROT 6.3 01/23/2019 1518   ALBUMIN 4.2 01/23/2019 1518   AST 31 01/23/2019 1518   ALT 46 (H) 01/23/2019 1518   ALKPHOS 59 01/23/2019 1518   BILITOT 0.2 01/23/2019 1518   BILIDIR 0.0 05/02/2015 0823      Component Value Date/Time   TSH 1.830 10/12/2018 1325   TSH 2.58 04/06/2016 1408   TSH 2.86 07/16/2014 1051      I, Trixie Dredge, am acting as Location manager for Ilene Qua, MD   I have reviewed the above documentation for accuracy and completeness, and I agree with the above. - Ilene Qua, MD

## 2019-03-21 ENCOUNTER — Ambulatory Visit (INDEPENDENT_AMBULATORY_CARE_PROVIDER_SITE_OTHER): Payer: Medicare Other | Admitting: Family Medicine

## 2019-03-21 ENCOUNTER — Encounter (INDEPENDENT_AMBULATORY_CARE_PROVIDER_SITE_OTHER): Payer: Self-pay | Admitting: Family Medicine

## 2019-03-21 ENCOUNTER — Other Ambulatory Visit: Payer: Self-pay

## 2019-03-21 DIAGNOSIS — R7303 Prediabetes: Secondary | ICD-10-CM

## 2019-03-21 DIAGNOSIS — Z6830 Body mass index (BMI) 30.0-30.9, adult: Secondary | ICD-10-CM

## 2019-03-21 DIAGNOSIS — E7849 Other hyperlipidemia: Secondary | ICD-10-CM | POA: Diagnosis not present

## 2019-03-21 DIAGNOSIS — E669 Obesity, unspecified: Secondary | ICD-10-CM | POA: Diagnosis not present

## 2019-03-22 NOTE — Progress Notes (Signed)
Office: [phone]  /  Fax: [phone] TeleHealth Visit:  Kristin Delgado has verbally consented to this TeleHealth visit today. The patient is located at home, the provider is located at the [facility] office. The participants in this visit include the listed provider and patient and patient's husband. The visit was conducted today via webex.  HPI:   Chief Complaint: OBESITY Kristin Delgado is here to discuss her progress with her obesity treatment plan. She is on the Category 2 plan and is following her eating plan approximately 85 % of the time. She states she is walking at work. Kristin Delgado weight is at 166.6 lbs. She voices that she is still working the same amount of hours at work and is limited in energy after work. She is still trying to figure out breakfast, what she can do instead of yogurt.  We were unable to weigh the patient today for this TeleHealth visit. She feels as if she has lost 1-2 lbs since her last visit. She has lost 16-18 lbs since starting treatment with [name].  Hyperlipidemia Kristin Delgado has hyperlipidemia and has been trying to improve her cholesterol levels with intensive lifestyle modification including a low saturated fat diet, exercise and weight loss. Last LDL was elevated at 161 and HDL of 39.6. She is on Lipitor and denies any chest pain, claudication or myalgias.  Pre-Diabetes Kristin Delgado has a diagnosis of pre-diabetes based on her elevated Hgb A1c, most recently at 5.3. She was informed this puts her at greater risk of developing diabetes. She is not taking metformin currently and notes minimal carbohydrate cravings, if any. She continues to work on diet and exercise to decrease risk of diabetes. She denies nausea or hypoglycemia.  ASSESSMENT AND PLAN:  Other hyperlipidemia  Prediabetes  Class 1 obesity with serious comorbidity and body mass index (BMI) of 30.0 to 30.9 in adult, unspecified obesity type  PLAN:  Hyperlipidemia Kristin Delgado was informed of the  American Heart Association Guidelines emphasizing intensive lifestyle modifications as the first line treatment for hyperlipidemia. We discussed many lifestyle modifications today in depth, and Kristin Delgado will continue to work on decreasing saturated fats such as fatty red meat, butter and many fried foods. She will also increase vegetables and lean protein in her diet and continue to work on exercise and weight loss efforts. We will repeat FLP in late June and change Lipitor dose, pending results. Kristin Delgado agrees to follow up with our clinic in 2 weeks.  Pre-Diabetes Kristin Delgado will continue to work on weight loss, exercise, and decreasing simple carbohydrates in her diet to help decrease the risk of diabetes. We dicussed metformin including benefits and risks. She was informed that eating too many simple carbohydrates or too many calories at one sitting increases the likelihood of GI side effects. Kristin Delgado declined metformin for now and a prescription was not written today. We will repeat labs in 1 and 1/2 months. Kristin Delgado agrees to follow up with our clinic in 2 weeks as directed to monitor her progress.  Obesity Kristin Delgado is currently in the action stage of change. As such, her goal is to continue with weight loss efforts She has agreed to follow the Category 2 plan Kristin Delgado has been instructed to work up to a goal of 150 minutes of combined cardio and strengthening exercise per week for weight loss and overall health benefits. We discussed the following Behavioral Modification Strategies today: increasing lean protein intake, increasing vegetables and work on meal planning and easy cooking plans, keeping healthy foods in  the home, and planning for success   Kristin Delgado has agreed to follow up with our clinic in 2 weeks. She was informed of the importance of frequent follow up visits to maximize her success with intensive lifestyle modifications for her multiple health conditions.  ALLERGIES: No Known Allergies   MEDICATIONS: Current Outpatient Medications on File Prior to Visit  Medication Sig Dispense Refill  . acetaminophen (TYLENOL ARTHRITIS PAIN) 650 MG CR tablet Take 650 mg by mouth every 8 (eight) hours as needed. pain     . Ascorbic Acid (VITAMIN C) 1000 MG tablet Take 1,000 mg by mouth 2 (two) times daily.      [name] atorvastatin (LIPITOR) 20 MG tablet Take 1 tablet (20 mg total) by mouth daily. 90 tablet 1  . b complex vitamins tablet Take 1 tablet by mouth daily.    . bromocriptine (PARLODEL) 2.5 MG tablet Take 1 tablet (2.5 mg total) by mouth daily. 90 tablet 0  . Calcium Polycarbophil (FIBER-CAPS PO) Take by mouth.    [name] CALCIUM-MAGNESIUM-ZINC PO Take by mouth.    . famotidine (PEPCID AC) 10 MG chewable tablet Chew 10 mg by mouth daily.      . fenofibrate 160 MG tablet TAKE 1 TABLET DAILY (Patient not taking: Reported on 01/30/2019) 90 tablet 1  . ferrous sulfate 325 (65 FE) MG EC tablet Take 325 mg by mouth 3 (three) times daily with meals.    [name] glucosamine-chondroitin 500-400 MG tablet Take 1 tablet by mouth 3 (three) times daily.    . hydrochlorothiazide (HYDRODIURIL) 12.5 MG tablet Take 2 tablets (25 mg total) by mouth daily. 60 tablet 0  . lisinopril (PRINIVIL,ZESTRIL) 10 MG tablet Take 0.5 tablets (5 mg total) by mouth daily. 45 tablet 1  . Multiple Vitamin (MULTIVITAMIN) tablet Take 1 tablet by mouth daily.      . Omega 3-6-9 Fatty Acids (OMEGA 3-6-9 COMPLEX PO) Take by mouth.    . Omega-3 Fatty Acids (FISH OIL) 1200 MG CAPS Take 1 capsule by mouth 2 (two) times daily.    . polyethylene glycol powder (GLYCOLAX/MIRALAX) powder Take 17 g by mouth daily. 3350 g 0  . Probiotic Product (PROBIOTIC ADVANCED PO) Take by mouth.    . ST JOHNS WORT PO Take by mouth.     No current facility-administered medications on file prior to visit.     PAST MEDICAL HISTORY: Past Medical History:  Diagnosis Date  . Allergy    seasonal  . Anxiety    closed space like MRI  . Back pain \  . Chest  pain 2006   admitted, essentially negative  . Constipation   . Depression   . GERD (gastroesophageal reflux disease)   . Hyperlipidemia   . Hypertension   . Infertility, female   . Joint pain   . Pituitary microadenoma ([place])   . Swallowing difficulty   . 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 Vitamin D deficiency     PAST SURGICAL HISTORY: Past Surgical History:  Procedure Laterality Date  . 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 BRAIN SURGERY  1 / 1985   microadenoma  . COCHLEAR IMPLANT  02/2013   Left side    SOCIAL HISTORY: Social History   Tobacco Use  . Smoking status: Never Smoker  . Smokeless tobacco: Never Used  Substance Use Topics  . Alcohol use: No    Comment: rare  . Drug use: No    FAMILY HISTORY: Family History  Problem Relation Age  of Onset  . Lung cancer Mother   . Cancer Mother 38       lung   . Alzheimer's disease Father   . Dementia Father     ROS: Review of Systems  Constitutional: Positive for weight loss.  Cardiovascular: Negative for chest pain and claudication.  Gastrointestinal: Negative for nausea.  Musculoskeletal: Negative for myalgias.  Endo/Heme/Allergies:       Negative hypoglycemia    PHYSICAL EXAM: Pt in no acute distress  RECENT LABS AND TESTS: BMET    Component Value Date/Time   NA 143 01/23/2019 1518   K 4.3 01/23/2019 1518   CL 105 01/23/2019 1518   CO2 25 01/23/2019 1518   GLUCOSE 86 01/23/2019 1518   GLUCOSE 110 (H) 08/01/2018 0932   BUN 21 01/23/2019 1518   CREATININE 0.59 01/23/2019 1518   CALCIUM 9.5 01/23/2019 1518   GFRNONAA 97 01/23/2019 1518   GFRAA 112 01/23/2019 1518   Lab Results  Component Value Date   HGBA1C 5.3 01/23/2019   HGBA1C 5.6 10/12/2018   HGBA1C 6.1 01/28/2017   Lab Results  Component Value Date   INSULIN 6.2 01/23/2019   INSULIN 5.9 10/12/2018   CBC    Component Value Date/Time   WBC 6.5 01/23/2019 1518   WBC 6.4 02/08/2018 1125   RBC 4.61 01/23/2019 1518   RBC 4.21  02/08/2018 1125   HGB 11.5 01/23/2019 1518   HCT 38.0 01/23/2019 1518   PLT 243 10/12/2018 1325   MCV 82 01/23/2019 1518   MCH 24.9 (L) 01/23/2019 1518   MCH 26.4 (L) 02/08/2018 1125   MCHC 30.3 (L) 01/23/2019 1518   MCHC 33.2 02/08/2018 1125   RDW 14.4 01/23/2019 1518   LYMPHSABS 2.1 01/23/2019 1518   MONOABS 0.4 07/26/2017 1028   EOSABS 0.1 01/23/2019 1518   BASOSABS 0.0 01/23/2019 1518   Iron/TIBC/Ferritin/ %Sat    Component Value Date/Time   IRON 52 12/12/2007 1500   IRONPCTSAT 14.6 (L) 12/12/2007 1500   Lipid Panel     Component Value Date/Time   CHOL 237 (H) 01/30/2019 0932   TRIG 180.0 (H) 01/30/2019 0932   HDL 39.60 01/30/2019 0932   CHOLHDL 6 01/30/2019 0932   VLDL 36.0 01/30/2019 0932   LDLCALC 161 (H) 01/30/2019 0932   LDLDIRECT 83.0 01/28/2017 1011   Hepatic Function Panel     Component Value Date/Time   PROT 6.3 01/23/2019 1518   ALBUMIN 4.2 01/23/2019 1518   AST 31 01/23/2019 1518   ALT 46 (H) 01/23/2019 1518   ALKPHOS 59 01/23/2019 1518   BILITOT 0.2 01/23/2019 1518   BILIDIR 0.0 05/02/2015 0823      Component Value Date/Time   TSH 1.830 10/12/2018 1325   TSH 2.58 04/06/2016 1408   TSH 2.86 07/16/2014 1051      I, Trixie Dredge, am acting as Location manager for Ilene Qua, MD  I have reviewed the above documentation for accuracy and completeness, and I agree with the above. - Ilene Qua, MD

## 2019-04-01 ENCOUNTER — Other Ambulatory Visit (INDEPENDENT_AMBULATORY_CARE_PROVIDER_SITE_OTHER): Payer: Self-pay | Admitting: Family Medicine

## 2019-04-01 DIAGNOSIS — I10 Essential (primary) hypertension: Secondary | ICD-10-CM

## 2019-04-05 ENCOUNTER — Ambulatory Visit (INDEPENDENT_AMBULATORY_CARE_PROVIDER_SITE_OTHER): Payer: Medicare Other | Admitting: Family Medicine

## 2019-04-05 ENCOUNTER — Other Ambulatory Visit: Payer: Self-pay

## 2019-04-05 ENCOUNTER — Encounter (INDEPENDENT_AMBULATORY_CARE_PROVIDER_SITE_OTHER): Payer: Self-pay | Admitting: Family Medicine

## 2019-04-05 DIAGNOSIS — E669 Obesity, unspecified: Secondary | ICD-10-CM | POA: Diagnosis not present

## 2019-04-05 DIAGNOSIS — Z6830 Body mass index (BMI) 30.0-30.9, adult: Secondary | ICD-10-CM

## 2019-04-05 DIAGNOSIS — E7849 Other hyperlipidemia: Secondary | ICD-10-CM

## 2019-04-05 DIAGNOSIS — I10 Essential (primary) hypertension: Secondary | ICD-10-CM | POA: Diagnosis not present

## 2019-04-05 NOTE — Progress Notes (Signed)
Office: [phone]  /  Fax: [phone] TeleHealth Visit:  [name] has verbally consented to this TeleHealth visit today. The patient is located at home, the provider is located at the [facility] office. The participants in this visit include the listed provider and patient and patient's husband. The visit was conducted today via skype.  HPI:   Chief Complaint: OBESITY Kristin Delgado is here to discuss her progress with her obesity treatment plan. She is on the Category 2 plan and is following her eating plan approximately 80-85 % of the time. She states she is walking 5,000 steps 5 times per week. Cheyne had a difficult busy few weeks. Moving her daughter out of collage and getting tired of work. She notes some stress with her 2 daughters now living at home. She is still struggling with getting breakfast in prior to work in the morning. Her weight is of 164 lbs this morning.  We were unable to weigh the patient today for this TeleHealth visit. She feels as if she has maintained her weight since her last visit. She has lost 16-18 lbs since starting treatment with [name].  Hyperlipidemia [name] has hyperlipidemia and has been trying to improve her cholesterol levels with intensive lifestyle modification including a low saturated fat diet, exercise and weight loss. Last LDL was still elevated on statin. She denies any chest pain, claudication or myalgias.  Hypertension [name] is a 63 y.o. female with hypertension. [name]'s blood pressure was previously controlled. She denies chest pain, chest pressure, dizziness, or lightheadedness. She is working on weight loss to help control her blood pressure with the goal of decreasing her risk of heart attack and stroke.  ASSESSMENT AND PLAN:  Other hyperlipidemia  Essential hypertension  Class 1 obesity with serious comorbidity and body mass index (BMI) of 30.0 to 30.9 in adult, unspecified obesity type  PLAN:  Hyperlipidemia  Klaira was informed of the American Heart Association Guidelines emphasizing intensive lifestyle modifications as the first line treatment for hyperlipidemia. We discussed many lifestyle modifications today in depth, and [name] will continue to work on decreasing saturated fats such as fatty red meat, butter and many fried foods. She will also increase vegetables and lean protein in her diet and continue to work on exercise and weight loss efforts. We will repeat FLP at the end of June or beginning of July. [name] agrees to follow up with our clinic in 2 weeks.  Hypertension We discussed sodium restriction, working on healthy weight loss, and a regular exercise program as the means to achieve improved blood pressure control. [name] agreed with this plan and agreed to follow up as directed. We will continue to monitor her blood pressure as well as her progress with the above lifestyle modifications. [name] agrees to continue her current medications, no change in dose and will watch for signs of hypotension as she continues her lifestyle modifications. [name] agrees to follow up with our clinic in 2 weeks.  Obesity [name] is currently in the action stage of change. As such, her goal is to continue with weight loss efforts She has agreed to follow the Category 2 plan [name] has been instructed to work up to a goal of 150 minutes of combined cardio and strengthening exercise per week for weight loss and overall health benefits. We discussed the following Behavioral Modification Strategies today: increasing lean protein intake, increasing vegetables and work on meal planning and easy cooking plans, keeping healthy foods in the home, better snacking choices, planning  for success, and keep a strict food journal   [name] has agreed to follow up with our clinic in 2 weeks. She was informed of the importance of frequent follow up visits to maximize her success with intensive lifestyle modifications for  her multiple health conditions.  ALLERGIES: No Known Allergies  MEDICATIONS: Current Outpatient Medications on File Prior to Visit  Medication Sig Dispense Refill  . acetaminophen (TYLENOL ARTHRITIS PAIN) 650 MG CR tablet Take 650 mg by mouth every 8 (eight) hours as needed. pain     . Ascorbic Acid (VITAMIN C) 1000 MG tablet Take 1,000 mg by mouth 2 (two) times daily.      [name] atorvastatin (LIPITOR) 20 MG tablet Take 1 tablet (20 mg total) by mouth daily. 90 tablet 1  . b complex vitamins tablet Take 1 tablet by mouth daily.    . bromocriptine (PARLODEL) 2.5 MG tablet Take 1 tablet (2.5 mg total) by mouth daily. 90 tablet 0  . Calcium Polycarbophil (FIBER-CAPS PO) Take by mouth.    [name] CALCIUM-MAGNESIUM-ZINC PO Take by mouth.    . famotidine (PEPCID AC) 10 MG chewable tablet Chew 10 mg by mouth daily.      . fenofibrate 160 MG tablet TAKE 1 TABLET DAILY (Patient not taking: Reported on 01/30/2019) 90 tablet 1  . ferrous sulfate 325 (65 FE) MG EC tablet Take 325 mg by mouth 3 (three) times daily with meals.    [name] glucosamine-chondroitin 500-400 MG tablet Take 1 tablet by mouth 3 (three) times daily.    . hydrochlorothiazide (HYDRODIURIL) 12.5 MG tablet Take 2 tablets (25 mg total) by mouth daily. 60 tablet 0  . lisinopril (PRINIVIL,ZESTRIL) 10 MG tablet Take 0.5 tablets (5 mg total) by mouth daily. 45 tablet 1  . Multiple Vitamin (MULTIVITAMIN) tablet Take 1 tablet by mouth daily.      . Omega 3-6-9 Fatty Acids (OMEGA 3-6-9 COMPLEX PO) Take by mouth.    . Omega-3 Fatty Acids (FISH OIL) 1200 MG CAPS Take 1 capsule by mouth 2 (two) times daily.    . polyethylene glycol powder (GLYCOLAX/MIRALAX) powder Take 17 g by mouth daily. 3350 g 0  . Probiotic Product (PROBIOTIC ADVANCED PO) Take by mouth.    . ST JOHNS WORT PO Take by mouth.     No current facility-administered medications on file prior to visit.     PAST MEDICAL HISTORY: Past Medical History:  Diagnosis Date  . Allergy    seasonal   . Anxiety    closed space like MRI  . Back pain \  . Chest pain 2006   admitted, essentially negative  . Constipation   . Depression   . GERD (gastroesophageal reflux disease)   . Hyperlipidemia   . Hypertension   . Infertility, female   . Joint pain   . Pituitary microadenoma ([place])   . Swallowing difficulty   . 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 Vitamin D deficiency     PAST SURGICAL HISTORY: Past Surgical History:  Procedure Laterality Date  . 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 BRAIN SURGERY  1 / 1985   microadenoma  . COCHLEAR IMPLANT  02/2013   Left side    SOCIAL HISTORY: Social History   Tobacco Use  . Smoking status: Never Smoker  . Smokeless tobacco: Never Used  Substance Use Topics  . Alcohol use: No    Comment: rare  . Drug use: No    FAMILY HISTORY: Family History  Problem  Relation Age of Onset  . Lung cancer Mother   . Cancer Mother 25       lung   . Alzheimer's disease Father   . Dementia Father     ROS: Review of Systems  Constitutional: Negative for weight loss.  Cardiovascular: Negative for chest pain and claudication.       Negative chest pressure  Musculoskeletal: Negative for myalgias.  Neurological: Negative for dizziness.       Negative lightheadedness    PHYSICAL EXAM: Pt in no acute distress  RECENT LABS AND TESTS: BMET    Component Value Date/Time   NA 143 01/23/2019 1518   K 4.3 01/23/2019 1518   CL 105 01/23/2019 1518   CO2 25 01/23/2019 1518   GLUCOSE 86 01/23/2019 1518   GLUCOSE 110 (H) 08/01/2018 0932   BUN 21 01/23/2019 1518   CREATININE 0.59 01/23/2019 1518   CALCIUM 9.5 01/23/2019 1518   GFRNONAA 97 01/23/2019 1518   GFRAA 112 01/23/2019 1518   Lab Results  Component Value Date   HGBA1C 5.3 01/23/2019   HGBA1C 5.6 10/12/2018   HGBA1C 6.1 01/28/2017   Lab Results  Component Value Date   INSULIN 6.2 01/23/2019   INSULIN 5.9 10/12/2018   CBC    Component Value Date/Time   WBC 6.5 01/23/2019 1518    WBC 6.4 02/08/2018 1125   RBC 4.61 01/23/2019 1518   RBC 4.21 02/08/2018 1125   HGB 11.5 01/23/2019 1518   HCT 38.0 01/23/2019 1518   PLT 243 10/12/2018 1325   MCV 82 01/23/2019 1518   MCH 24.9 (L) 01/23/2019 1518   MCH 26.4 (L) 02/08/2018 1125   MCHC 30.3 (L) 01/23/2019 1518   MCHC 33.2 02/08/2018 1125   RDW 14.4 01/23/2019 1518   LYMPHSABS 2.1 01/23/2019 1518   MONOABS 0.4 07/26/2017 1028   EOSABS 0.1 01/23/2019 1518   BASOSABS 0.0 01/23/2019 1518   Iron/TIBC/Ferritin/ %Sat    Component Value Date/Time   IRON 52 12/12/2007 1500   IRONPCTSAT 14.6 (L) 12/12/2007 1500   Lipid Panel     Component Value Date/Time   CHOL 237 (H) 01/30/2019 0932   TRIG 180.0 (H) 01/30/2019 0932   HDL 39.60 01/30/2019 0932   CHOLHDL 6 01/30/2019 0932   VLDL 36.0 01/30/2019 0932   LDLCALC 161 (H) 01/30/2019 0932   LDLDIRECT 83.0 01/28/2017 1011   Hepatic Function Panel     Component Value Date/Time   PROT 6.3 01/23/2019 1518   ALBUMIN 4.2 01/23/2019 1518   AST 31 01/23/2019 1518   ALT 46 (H) 01/23/2019 1518   ALKPHOS 59 01/23/2019 1518   BILITOT 0.2 01/23/2019 1518   BILIDIR 0.0 05/02/2015 0823      Component Value Date/Time   TSH 1.830 10/12/2018 1325   TSH 2.58 04/06/2016 1408   TSH 2.86 07/16/2014 1051      I, Trixie Dredge, am acting as Location manager for Ilene Qua, MD  I have reviewed the above documentation for accuracy and completeness, and I agree with the above. - Ilene Qua, MD

## 2019-04-20 ENCOUNTER — Other Ambulatory Visit: Payer: Self-pay

## 2019-04-20 ENCOUNTER — Ambulatory Visit (INDEPENDENT_AMBULATORY_CARE_PROVIDER_SITE_OTHER): Payer: Medicare Other | Admitting: Family Medicine

## 2019-04-20 ENCOUNTER — Encounter (INDEPENDENT_AMBULATORY_CARE_PROVIDER_SITE_OTHER): Payer: Self-pay | Admitting: Family Medicine

## 2019-04-20 DIAGNOSIS — R7303 Prediabetes: Secondary | ICD-10-CM

## 2019-04-20 DIAGNOSIS — Z6830 Body mass index (BMI) 30.0-30.9, adult: Secondary | ICD-10-CM | POA: Diagnosis not present

## 2019-04-20 DIAGNOSIS — E7849 Other hyperlipidemia: Secondary | ICD-10-CM | POA: Diagnosis not present

## 2019-04-20 DIAGNOSIS — E669 Obesity, unspecified: Secondary | ICD-10-CM

## 2019-04-20 NOTE — Progress Notes (Signed)
Office: [phone]  /  Fax: [phone] TeleHealth Visit:  [name] has verbally consented to this TeleHealth visit today. The patient is located at home, the provider is located at the [facility] office. The participants in this visit include the listed provider and patient and patient's husband. The visit was conducted today via skype.  HPI:   Chief Complaint: OBESITY Kristin Delgado is here to discuss her progress with her obesity treatment plan. She is on the Category 2 plan and is following her eating plan approximately 75 % of the time. She states she is walking more. Kristin Delgado is still working and has had hours only slightly affected. She has been following the meal plan, but still mentions that breakfast is difficult because of how early she has to go into work. She is trying to eat something prior to work but is not always successful at getting that in.  We were unable to weigh the patient today for this TeleHealth visit. She feels as if she has maintained her weight since her last visit. She has lost 16-18 lbs since starting treatment with [name].  Hyperlipidemia Kristin Delgado has hyperlipidemia and has been trying to improve her cholesterol levels with intensive lifestyle modification including a low saturated fat diet, exercise and weight loss. Last LDL was of 161 and HDL 39.6. She is on statin and denies any chest pain, claudication or myalgias.  Pre-Diabetes Kristin Delgado has a diagnosis of pre-diabetes based on her elevated Hgb A1c of 5.3 in early March, and was informed this puts her at greater risk of developing diabetes. She is not taking metformin currently and continues to work on diet and exercise to decrease risk of diabetes. She denies nausea or hypoglycemia.  ASSESSMENT AND PLAN:  Other hyperlipidemia  Prediabetes  Class 1 obesity with serious comorbidity and body mass index (BMI) of 30.0 to 30.9 in adult, unspecified obesity type  PLAN:  Hyperlipidemia Kristin Delgado  was informed of the American Heart Association Guidelines emphasizing intensive lifestyle modifications as the first line treatment for hyperlipidemia. We discussed many lifestyle modifications today in depth, and Kristin Delgado will continue to work on decreasing saturated fats such as fatty red meat, butter and many fried foods. She will also increase vegetables and lean protein in her diet and continue to work on exercise and weight loss efforts. We will repeat labs at her first in person appointment in early July. Kristin Delgado agrees to follow up with our clinic in 2 weeks.  Pre-Diabetes Kristin Delgado will continue to work on weight loss, exercise, and decreasing simple carbohydrates in her diet to help decrease the risk of diabetes. We dicussed metformin including benefits and risks. She was informed that eating too many simple carbohydrates or too many calories at one sitting increases the likelihood of GI side effects. Kristin Delgado declined metformin for now and a prescription was not written today. We will repeat labs at her first in person appointment in early July. Kristin Delgado agrees to follow up with our clinic in as directed to monitor her progress.  Obesity Kristin Delgado is currently in the action stage of change. As such, her goal is to continue with weight loss efforts She has agreed to follow the Category 2 plan Kristin Delgado has been instructed to work up to a goal of 150 minutes of combined cardio and strengthening exercise per week for weight loss and overall health benefits. We discussed the following Behavioral Modification Strategies today: increasing lean protein intake, increasing vegetables and work on meal planning and easy cooking plans,  keeping healthy foods in the home, better snacking choices, and planning for success   Kristin Delgado has agreed to follow up with our clinic in 2 weeks. She was informed of the importance of frequent follow up visits to maximize her success with intensive lifestyle modifications for her  multiple health conditions.  ALLERGIES: No Known Allergies  MEDICATIONS: Current Outpatient Medications on File Prior to Visit  Medication Sig Dispense Refill  . acetaminophen (TYLENOL ARTHRITIS PAIN) 650 MG CR tablet Take 650 mg by mouth every 8 (eight) hours as needed. pain     . Ascorbic Acid (VITAMIN C) 1000 MG tablet Take 1,000 mg by mouth 2 (two) times daily.      [name] atorvastatin (LIPITOR) 20 MG tablet Take 1 tablet (20 mg total) by mouth daily. 90 tablet 1  . b complex vitamins tablet Take 1 tablet by mouth daily.    . bromocriptine (PARLODEL) 2.5 MG tablet Take 1 tablet (2.5 mg total) by mouth daily. 90 tablet 0  . Calcium Polycarbophil (FIBER-CAPS PO) Take by mouth.    [name] CALCIUM-MAGNESIUM-ZINC PO Take by mouth.    . famotidine (PEPCID AC) 10 MG chewable tablet Chew 10 mg by mouth daily.      . fenofibrate 160 MG tablet TAKE 1 TABLET DAILY (Patient not taking: Reported on 01/30/2019) 90 tablet 1  . ferrous sulfate 325 (65 FE) MG EC tablet Take 325 mg by mouth 3 (three) times daily with meals.    [name] glucosamine-chondroitin 500-400 MG tablet Take 1 tablet by mouth 3 (three) times daily.    . hydrochlorothiazide (HYDRODIURIL) 12.5 MG tablet Take 2 tablets (25 mg total) by mouth daily. 60 tablet 0  . lisinopril (PRINIVIL,ZESTRIL) 10 MG tablet Take 0.5 tablets (5 mg total) by mouth daily. 45 tablet 1  . Multiple Vitamin (MULTIVITAMIN) tablet Take 1 tablet by mouth daily.      . Omega 3-6-9 Fatty Acids (OMEGA 3-6-9 COMPLEX PO) Take by mouth.    . Omega-3 Fatty Acids (FISH OIL) 1200 MG CAPS Take 1 capsule by mouth 2 (two) times daily.    . polyethylene glycol powder (GLYCOLAX/MIRALAX) powder Take 17 g by mouth daily. 3350 g 0  . Probiotic Product (PROBIOTIC ADVANCED PO) Take by mouth.    . ST JOHNS WORT PO Take by mouth.     No current facility-administered medications on file prior to visit.     PAST MEDICAL HISTORY: Past Medical History:  Diagnosis Date  . Allergy    seasonal  .  Anxiety    closed space like MRI  . Back pain \  . Chest pain 2006   admitted, essentially negative  . Constipation   . Depression   . GERD (gastroesophageal reflux disease)   . Hyperlipidemia   . Hypertension   . Infertility, female   . Joint pain   . Pituitary microadenoma ([place])   . Swallowing difficulty   . 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 Vitamin D deficiency     PAST SURGICAL HISTORY: Past Surgical History:  Procedure Laterality Date  . 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 BRAIN SURGERY  1 / 1985   microadenoma  . COCHLEAR IMPLANT  02/2013   Left side    SOCIAL HISTORY: Social History   Tobacco Use  . Smoking status: Never Smoker  . Smokeless tobacco: Never Used  Substance Use Topics  . Alcohol use: No    Comment: rare  . Drug use: No    FAMILY  HISTORY: Family History  Problem Relation Age of Onset  . Lung cancer Mother   . Cancer Mother 16       lung   . Alzheimer's disease Father   . Dementia Father     ROS: Review of Systems  Constitutional: Negative for weight loss.  Cardiovascular: Negative for chest pain and claudication.  Gastrointestinal: Negative for nausea.  Musculoskeletal: Negative for myalgias.  Endo/Heme/Allergies:       Negative hypoglycemia    PHYSICAL EXAM: Pt in no acute distress  RECENT LABS AND TESTS: BMET    Component Value Date/Time   NA 143 01/23/2019 1518   K 4.3 01/23/2019 1518   CL 105 01/23/2019 1518   CO2 25 01/23/2019 1518   GLUCOSE 86 01/23/2019 1518   GLUCOSE 110 (H) 08/01/2018 0932   BUN 21 01/23/2019 1518   CREATININE 0.59 01/23/2019 1518   CALCIUM 9.5 01/23/2019 1518   GFRNONAA 97 01/23/2019 1518   GFRAA 112 01/23/2019 1518   Lab Results  Component Value Date   HGBA1C 5.3 01/23/2019   HGBA1C 5.6 10/12/2018   HGBA1C 6.1 01/28/2017   Lab Results  Component Value Date   INSULIN 6.2 01/23/2019   INSULIN 5.9 10/12/2018   CBC    Component Value Date/Time   WBC 6.5 01/23/2019 1518   WBC 6.4  02/08/2018 1125   RBC 4.61 01/23/2019 1518   RBC 4.21 02/08/2018 1125   HGB 11.5 01/23/2019 1518   HCT 38.0 01/23/2019 1518   PLT 243 10/12/2018 1325   MCV 82 01/23/2019 1518   MCH 24.9 (L) 01/23/2019 1518   MCH 26.4 (L) 02/08/2018 1125   MCHC 30.3 (L) 01/23/2019 1518   MCHC 33.2 02/08/2018 1125   RDW 14.4 01/23/2019 1518   LYMPHSABS 2.1 01/23/2019 1518   MONOABS 0.4 07/26/2017 1028   EOSABS 0.1 01/23/2019 1518   BASOSABS 0.0 01/23/2019 1518   Iron/TIBC/Ferritin/ %Sat    Component Value Date/Time   IRON 52 12/12/2007 1500   IRONPCTSAT 14.6 (L) 12/12/2007 1500   Lipid Panel     Component Value Date/Time   CHOL 237 (H) 01/30/2019 0932   TRIG 180.0 (H) 01/30/2019 0932   HDL 39.60 01/30/2019 0932   CHOLHDL 6 01/30/2019 0932   VLDL 36.0 01/30/2019 0932   LDLCALC 161 (H) 01/30/2019 0932   LDLDIRECT 83.0 01/28/2017 1011   Hepatic Function Panel     Component Value Date/Time   PROT 6.3 01/23/2019 1518   ALBUMIN 4.2 01/23/2019 1518   AST 31 01/23/2019 1518   ALT 46 (H) 01/23/2019 1518   ALKPHOS 59 01/23/2019 1518   BILITOT 0.2 01/23/2019 1518   BILIDIR 0.0 05/02/2015 0823      Component Value Date/Time   TSH 1.830 10/12/2018 1325   TSH 2.58 04/06/2016 1408   TSH 2.86 07/16/2014 1051      I, Trixie Dredge, am acting as Location manager for Ilene Qua, MD  I have reviewed the above documentation for accuracy and completeness, and I agree with the above. - Ilene Qua, MD

## 2019-05-04 ENCOUNTER — Ambulatory Visit (INDEPENDENT_AMBULATORY_CARE_PROVIDER_SITE_OTHER): Payer: Medicare Other | Admitting: Family Medicine

## 2019-05-04 ENCOUNTER — Other Ambulatory Visit: Payer: Self-pay

## 2019-05-04 ENCOUNTER — Encounter (INDEPENDENT_AMBULATORY_CARE_PROVIDER_SITE_OTHER): Payer: Self-pay | Admitting: Family Medicine

## 2019-05-04 DIAGNOSIS — E7849 Other hyperlipidemia: Secondary | ICD-10-CM | POA: Diagnosis not present

## 2019-05-04 DIAGNOSIS — R7303 Prediabetes: Secondary | ICD-10-CM

## 2019-05-04 DIAGNOSIS — E669 Obesity, unspecified: Secondary | ICD-10-CM

## 2019-05-04 DIAGNOSIS — Z6830 Body mass index (BMI) 30.0-30.9, adult: Secondary | ICD-10-CM

## 2019-05-06 ENCOUNTER — Other Ambulatory Visit: Payer: Self-pay | Admitting: Family Medicine

## 2019-05-06 DIAGNOSIS — D352 Benign neoplasm of pituitary gland: Secondary | ICD-10-CM

## 2019-05-08 NOTE — Progress Notes (Deleted)
Virtual Visit via Video Note  I connected with patient on 05/09/19 at 11:00 AM EDT by audio enabled telemedicine application and verified that I am speaking with the correct person using two identifiers.   THIS ENCOUNTER IS A VIRTUAL VISIT DUE TO COVID-19 - PATIENT WAS NOT SEEN IN THE OFFICE. PATIENT HAS CONSENTED TO VIRTUAL VISIT / TELEMEDICINE VISIT   Location of patient: home  Location of provider: office  I discussed the limitations of evaluation and management by telemedicine and the availability of in person appointments. The patient expressed understanding and agreed to proceed.   Subjective:   Kristin Delgado is a 63 y.o. female who presents for Medicare Annual (Subsequent) preventive examination.  Review of Systems: No ROS.  Medicare Wellness Virtual Visit.  Visual/audio telehealth visit, UTA vital signs.   See social history for additional risk factors.   Sleep patterns: Home Safety/Smoke Alarms: Feels safe in home. Smoke alarms in place.  Lives with husband a 2 children in 1 story home.   Female:   Pap-  01/27/16     Mammo-       Dexa scan-        CCS- 08/12/11. 10 yr recall     Objective:     Vitals: There were no vitals taken for this visit.  There is no height or weight on file to calculate BMI.  Advanced Directives 01/24/2018  Does Patient Have a Medical Advance Directive? No  Would patient like information on creating a medical advance directive? Yes (MAU/Ambulatory/Procedural Areas - Information given)    Tobacco Social History   Tobacco Use  Smoking Status Never Smoker  Smokeless Tobacco Never Used     Counseling given: Not Answered   Clinical Intake:                       Past Medical History:  Diagnosis Date  . Allergy    seasonal  . Anxiety    closed space like MRI  . Back pain \  . Chest pain 2006   admitted, essentially negative  . Constipation   . Depression   . GERD (gastroesophageal reflux disease)   . Hyperlipidemia    . Hypertension   . Infertility, female   . Joint pain   . Pituitary microadenoma ([place])   . Swallowing difficulty   . 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 Vitamin D deficiency    Past Surgical History:  Procedure Laterality Date  . 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 BRAIN SURGERY  1 / 1985   microadenoma  . COCHLEAR IMPLANT  02/2013   Left side   Family History  Problem Relation Age of Onset  . Lung cancer Mother   . Cancer Mother 29       lung   . Alzheimer's disease Father   . Dementia Father    Social History   Socioeconomic History  . Marital status: Married    Spouse name: Annie Main  . Number of children: 3  . Years of education: Not on file  . Highest education level: Not on file  Occupational History  . Occupation: [employer]  Social Needs  . Financial resource strain: Not on file  . Food insecurity    Worry: Not on file    Inability: Not on file  . Transportation needs    Medical: Not on file    Non-medical: Not on file  Tobacco Use  . Smoking status: Never Smoker  . Smokeless  tobacco: Never Used  Substance and Sexual Activity  . Alcohol use: No    Comment: rare  . Drug use: No  . Sexual activity: Not Currently    Partners: Male  Lifestyle  . Physical activity    Days per week: Not on file    Minutes per session: Not on file  . Stress: Not on file  Relationships  . Social Herbalist on phone: Not on file    Gets together: Not on file    Attends religious service: Not on file    Active member of club or organization: Not on file    Attends meetings of clubs or organizations: Not on file    Relationship status: Not on file  Other Topics Concern  . Not on file  Social History Narrative   Exercise-- daily --- walking, 3x a week DVD    Outpatient Encounter Medications as of 05/09/2019  Medication Sig  . acetaminophen (TYLENOL ARTHRITIS PAIN) 650 MG CR tablet Take 650 mg by mouth every 8 (eight) hours as needed. pain   . Ascorbic Acid  (VITAMIN C) 1000 MG tablet Take 1,000 mg by mouth 2 (two) times daily.    [name] atorvastatin (LIPITOR) 20 MG tablet Take 1 tablet (20 mg total) by mouth daily.  [name] b complex vitamins tablet Take 1 tablet by mouth daily.  . bromocriptine (PARLODEL) 2.5 MG tablet Take 1 tablet (2.5 mg total) by mouth daily.  . Calcium Polycarbophil (FIBER-CAPS PO) Take by mouth.  [name] CALCIUM-MAGNESIUM-ZINC PO Take by mouth.  . famotidine (PEPCID AC) 10 MG chewable tablet Chew 10 mg by mouth daily.    . fenofibrate 160 MG tablet TAKE 1 TABLET DAILY (Patient not taking: Reported on 01/30/2019)  . ferrous sulfate 325 (65 FE) MG EC tablet Take 325 mg by mouth 3 (three) times daily with meals.  [name] glucosamine-chondroitin 500-400 MG tablet Take 1 tablet by mouth 3 (three) times daily.  . hydrochlorothiazide (HYDRODIURIL) 12.5 MG tablet Take 2 tablets (25 mg total) by mouth daily.  [name] lisinopril (PRINIVIL,ZESTRIL) 10 MG tablet Take 0.5 tablets (5 mg total) by mouth daily.  . Multiple Vitamin (MULTIVITAMIN) tablet Take 1 tablet by mouth daily.    . Omega 3-6-9 Fatty Acids (OMEGA 3-6-9 COMPLEX PO) Take by mouth.  . Omega-3 Fatty Acids (FISH OIL) 1200 MG CAPS Take 1 capsule by mouth 2 (two) times daily.  . polyethylene glycol powder (GLYCOLAX/MIRALAX) powder Take 17 g by mouth daily.  . Probiotic Product (PROBIOTIC ADVANCED PO) Take by mouth.  . ST JOHNS WORT PO Take by mouth.   No facility-administered encounter medications on file as of 05/09/2019.     Activities of Daily Living No flowsheet data found.  Patient Care Team: Carollee Herter, Alferd Apa, DO as PCP - General Poudyal, Ritesh, OD (Optometry) Sanjuana Kava, MD as Referring Physician (Otolaryngology) Dentistry, [facility] as Consulting Physician    Assessment:   This is a routine wellness examination for Courtney. Physical assessment deferred to PCP.  Exercise Activities and Dietary recommendations   Diet (meal preparation, eat out, water intake,  caffeinated beverages, dairy products, fruits and vegetables): {Desc; diets:16563} Breakfast: Lunch:  Dinner:      Goals    . Weight (lb) < 175 lb (79.4 kg) (pt-stated)     Exercise and drink more water.       Fall Risk Fall Risk  01/24/2018  Falls in the past year? No    Depression Screen  PHQ 2/9 Scores 10/12/2018 01/24/2018 07/14/2013  PHQ - 2 Score 2 0 0  PHQ- 9 Score 8 - -  Exception Documentation Medical reason - -     Cognitive Function Ad8 score reviewed for issues:  Issues making decisions:  Less interest in hobbies / activities:  Repeats questions, stories (family complaining):  Trouble using ordinary gadgets (microwave, computer, phone):  Forgets the month or year:   Mismanaging finances:   Remembering appts:  Daily problems with thinking and/or memory: Ad8 score is=     MMSE - Mini Mental State Exam 01/24/2018  Orientation to time 5  Orientation to Place 5  Registration 3  Attention/ Calculation 5  Recall 3  Language- name 2 objects 2  Language- repeat 1  Language- follow 3 step command 3  Language- read & follow direction 1  Write a sentence 1  Copy design 1  Total score 30        Immunization History  Administered Date(s) Administered  . Tdap 06/10/2011  . Zoster 07/25/2015    Screening Tests Health Maintenance  Topic Date Due  . MAMMOGRAM  08/23/2015  . PAP SMEAR-Modifier  01/27/2019  . HIV Screening  08/01/2024 (Originally 10/14/1971)  . INFLUENZA VACCINE  06/17/2019  . TETANUS/TDAP  06/09/2021  . COLONOSCOPY  08/11/2021  . Hepatitis C Screening  Completed      Plan:   ***   I have personally reviewed and noted the following in the patient's chart:   . Medical and social history . Use of alcohol, tobacco or illicit drugs  . Current medications and supplements . Functional ability and status . Nutritional status . Physical activity . Advanced directives . List of other physicians . Hospitalizations, surgeries,  and ER visits in previous 12 months . Vitals . Screenings to include cognitive, depression, and falls . Referrals and appointments  In addition, I have reviewed and discussed with patient certain preventive protocols, quality metrics, and best practice recommendations. A written personalized care plan for preventive services as well as general preventive health recommendations were provided to patient.     Naaman Plummer [place], [place]  05/08/2019

## 2019-05-08 NOTE — Progress Notes (Signed)
Office: [phone]  /  Fax: [phone] TeleHealth Visit:  Alfreda Hammad has verbally consented to this TeleHealth visit today. The patient is located at home, the provider is located at the [facility] office. The participants in this visit include the listed provider and patient and patient's husband. The visit was conducted today via webex.  HPI:   Chief Complaint: OBESITY Arneta is here to discuss her progress with her obesity treatment plan. She is on the Category 2 plan and is following her eating plan approximately 60 % of the time. She states she is walking 4,000-5,000 steps 5 times per week. Reghan is not really following the plan, she has had significant stress with her daughters and stress at work. She has had her first migraine in about 1 year the other day. Her weight is of 165 lbs.  We were unable to weigh the patient today for this TeleHealth visit. She feels as if she has maintained her weight since her last visit. She has lost 18 lbs since starting treatment with [name].  Hyperlipidemia Nancylee has hyperlipidemia and has been trying to improve her cholesterol levels with intensive lifestyle modification including a low saturated fat diet, exercise and weight loss. She is on statin and denies any chest pain, claudication or myalgias.  Pre-Diabetes Nylia has a diagnosis of pre-diabetes based on her elevated Hgb A1c of 5.6 and was informed this puts her at greater risk of developing diabetes. She is not taking metformin currently and continues to work on diet and exercise to decrease risk of diabetes. She denies nausea or hypoglycemia.  ASSESSMENT AND PLAN:  Other hyperlipidemia  Prediabetes  Class 1 obesity with serious comorbidity and body mass index (BMI) of 30.0 to 30.9 in adult, unspecified obesity type  PLAN:  Hyperlipidemia Beaux was informed of the American Heart Association Guidelines emphasizing intensive lifestyle modifications as the first  line treatment for hyperlipidemia. We discussed many lifestyle modifications today in depth, and Jessamy will continue to work on decreasing saturated fats such as fatty red meat, butter and many fried foods. She will also increase vegetables and lean protein in her diet and continue to work on exercise and weight loss efforts. We will repeat labs at her first in person appointment. Lattie agrees to follow up with our clinic in 2 weeks.  Pre-Diabetes Mindel will continue to work on weight loss, exercise, and decreasing simple carbohydrates in her diet to help decrease the risk of diabetes. We dicussed metformin including benefits and risks. She was informed that eating too many simple carbohydrates or too many calories at one sitting increases the likelihood of GI side effects. Laural declined metformin for now and a prescription was not written today. We will repeat labs at her first in patient appointment. Meloney agrees to follow up with our clinic in 2 weeks as directed to monitor her progress.  Obesity Aleene is currently in the action stage of change. As such, her goal is to continue with weight loss efforts She has agreed to keep a food journal with 1150-1300 calories and 80+ grams of protein daily Hadasa has been instructed to work up to a goal of 150 minutes of combined cardio and strengthening exercise per week for weight loss and overall health benefits. We discussed the following Behavioral Modification Strategies today: increasing lean protein intake, increasing vegetables, no skipping meals, and keep a strict food journal   Lenea has agreed to follow up with our clinic in 2 weeks. She was informed  of the importance of frequent follow up visits to maximize her success with intensive lifestyle modifications for her multiple health conditions.  ALLERGIES: No Known Allergies  MEDICATIONS: Current Outpatient Medications on File Prior to Visit  Medication Sig Dispense Refill  .  acetaminophen (TYLENOL ARTHRITIS PAIN) 650 MG CR tablet Take 650 mg by mouth every 8 (eight) hours as needed. pain     . Ascorbic Acid (VITAMIN C) 1000 MG tablet Take 1,000 mg by mouth 2 (two) times daily.      [name] atorvastatin (LIPITOR) 20 MG tablet Take 1 tablet (20 mg total) by mouth daily. 90 tablet 1  . b complex vitamins tablet Take 1 tablet by mouth daily.    . bromocriptine (PARLODEL) 2.5 MG tablet Take 1 tablet (2.5 mg total) by mouth daily. 90 tablet 0  . Calcium Polycarbophil (FIBER-CAPS PO) Take by mouth.    [name] CALCIUM-MAGNESIUM-ZINC PO Take by mouth.    . famotidine (PEPCID AC) 10 MG chewable tablet Chew 10 mg by mouth daily.      . fenofibrate 160 MG tablet TAKE 1 TABLET DAILY (Patient not taking: Reported on 01/30/2019) 90 tablet 1  . ferrous sulfate 325 (65 FE) MG EC tablet Take 325 mg by mouth 3 (three) times daily with meals.    [name] glucosamine-chondroitin 500-400 MG tablet Take 1 tablet by mouth 3 (three) times daily.    . hydrochlorothiazide (HYDRODIURIL) 12.5 MG tablet Take 2 tablets (25 mg total) by mouth daily. 60 tablet 0  . lisinopril (PRINIVIL,ZESTRIL) 10 MG tablet Take 0.5 tablets (5 mg total) by mouth daily. 45 tablet 1  . Multiple Vitamin (MULTIVITAMIN) tablet Take 1 tablet by mouth daily.      . Omega 3-6-9 Fatty Acids (OMEGA 3-6-9 COMPLEX PO) Take by mouth.    . Omega-3 Fatty Acids (FISH OIL) 1200 MG CAPS Take 1 capsule by mouth 2 (two) times daily.    . polyethylene glycol powder (GLYCOLAX/MIRALAX) powder Take 17 g by mouth daily. 3350 g 0  . Probiotic Product (PROBIOTIC ADVANCED PO) Take by mouth.    . ST JOHNS WORT PO Take by mouth.     No current facility-administered medications on file prior to visit.     PAST MEDICAL HISTORY: Past Medical History:  Diagnosis Date  . Allergy    seasonal  . Anxiety    closed space like MRI  . Back pain \  . Chest pain 2006   admitted, essentially negative  . Constipation   . Depression   . GERD (gastroesophageal reflux  disease)   . Hyperlipidemia   . Hypertension   . Infertility, female   . Joint pain   . Pituitary microadenoma ([place])   . Swallowing difficulty   . 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 Vitamin D deficiency     PAST SURGICAL HISTORY: Past Surgical History:  Procedure Laterality Date  . 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 BRAIN SURGERY  1 / 1985   microadenoma  . COCHLEAR IMPLANT  02/2013   Left side    SOCIAL HISTORY: Social History   Tobacco Use  . Smoking status: Never Smoker  . Smokeless tobacco: Never Used  Substance Use Topics  . Alcohol use: No    Comment: rare  . Drug use: No    FAMILY HISTORY: Family History  Problem Relation Age of Onset  . Lung cancer Mother   . Cancer Mother 2       lung   .  Alzheimer's disease Father   . Dementia Father     ROS: Review of Systems  Constitutional: Negative for weight loss.  Cardiovascular: Negative for chest pain and claudication.  Gastrointestinal: Negative for nausea.  Musculoskeletal: Negative for myalgias.  Endo/Heme/Allergies:       Negative hypoglycemia    PHYSICAL EXAM: Pt in no acute distress  RECENT LABS AND TESTS: BMET    Component Value Date/Time   NA 143 01/23/2019 1518   K 4.3 01/23/2019 1518   CL 105 01/23/2019 1518   CO2 25 01/23/2019 1518   GLUCOSE 86 01/23/2019 1518   GLUCOSE 110 (H) 08/01/2018 0932   BUN 21 01/23/2019 1518   CREATININE 0.59 01/23/2019 1518   CALCIUM 9.5 01/23/2019 1518   GFRNONAA 97 01/23/2019 1518   GFRAA 112 01/23/2019 1518   Lab Results  Component Value Date   HGBA1C 5.3 01/23/2019   HGBA1C 5.6 10/12/2018   HGBA1C 6.1 01/28/2017   Lab Results  Component Value Date   INSULIN 6.2 01/23/2019   INSULIN 5.9 10/12/2018   CBC    Component Value Date/Time   WBC 6.5 01/23/2019 1518   WBC 6.4 02/08/2018 1125   RBC 4.61 01/23/2019 1518   RBC 4.21 02/08/2018 1125   HGB 11.5 01/23/2019 1518   HCT 38.0 01/23/2019 1518   PLT 243 10/12/2018 1325   MCV 82  01/23/2019 1518   MCH 24.9 (L) 01/23/2019 1518   MCH 26.4 (L) 02/08/2018 1125   MCHC 30.3 (L) 01/23/2019 1518   MCHC 33.2 02/08/2018 1125   RDW 14.4 01/23/2019 1518   LYMPHSABS 2.1 01/23/2019 1518   MONOABS 0.4 07/26/2017 1028   EOSABS 0.1 01/23/2019 1518   BASOSABS 0.0 01/23/2019 1518   Iron/TIBC/Ferritin/ %Sat    Component Value Date/Time   IRON 52 12/12/2007 1500   IRONPCTSAT 14.6 (L) 12/12/2007 1500   Lipid Panel     Component Value Date/Time   CHOL 237 (H) 01/30/2019 0932   TRIG 180.0 (H) 01/30/2019 0932   HDL 39.60 01/30/2019 0932   CHOLHDL 6 01/30/2019 0932   VLDL 36.0 01/30/2019 0932   LDLCALC 161 (H) 01/30/2019 0932   LDLDIRECT 83.0 01/28/2017 1011   Hepatic Function Panel     Component Value Date/Time   PROT 6.3 01/23/2019 1518   ALBUMIN 4.2 01/23/2019 1518   AST 31 01/23/2019 1518   ALT 46 (H) 01/23/2019 1518   ALKPHOS 59 01/23/2019 1518   BILITOT 0.2 01/23/2019 1518   BILIDIR 0.0 05/02/2015 0823      Component Value Date/Time   TSH 1.830 10/12/2018 1325   TSH 2.58 04/06/2016 1408   TSH 2.86 07/16/2014 1051      I, Trixie Dredge, am acting as Location manager for Ilene Qua, MD   I have reviewed the above documentation for accuracy and completeness, and I agree with the above. - Ilene Qua, MD

## 2019-05-09 ENCOUNTER — Ambulatory Visit: Payer: Medicare Other | Admitting: *Deleted

## 2019-05-15 ENCOUNTER — Encounter (INDEPENDENT_AMBULATORY_CARE_PROVIDER_SITE_OTHER): Payer: Self-pay

## 2019-05-18 ENCOUNTER — Telehealth (INDEPENDENT_AMBULATORY_CARE_PROVIDER_SITE_OTHER): Payer: Medicare Other | Admitting: Family Medicine

## 2019-05-22 ENCOUNTER — Other Ambulatory Visit (INDEPENDENT_AMBULATORY_CARE_PROVIDER_SITE_OTHER): Payer: Self-pay | Admitting: Family Medicine

## 2019-05-22 DIAGNOSIS — I10 Essential (primary) hypertension: Secondary | ICD-10-CM

## 2019-06-01 ENCOUNTER — Encounter (INDEPENDENT_AMBULATORY_CARE_PROVIDER_SITE_OTHER): Payer: Self-pay | Admitting: Family Medicine

## 2019-06-01 ENCOUNTER — Other Ambulatory Visit: Payer: Self-pay

## 2019-06-01 ENCOUNTER — Telehealth (INDEPENDENT_AMBULATORY_CARE_PROVIDER_SITE_OTHER): Payer: Medicare Other | Admitting: Family Medicine

## 2019-06-01 DIAGNOSIS — E7849 Other hyperlipidemia: Secondary | ICD-10-CM

## 2019-06-01 DIAGNOSIS — Z6831 Body mass index (BMI) 31.0-31.9, adult: Secondary | ICD-10-CM | POA: Diagnosis not present

## 2019-06-01 DIAGNOSIS — I10 Essential (primary) hypertension: Secondary | ICD-10-CM | POA: Diagnosis not present

## 2019-06-01 DIAGNOSIS — E669 Obesity, unspecified: Secondary | ICD-10-CM | POA: Diagnosis not present

## 2019-06-06 NOTE — Progress Notes (Signed)
Office: [phone]  /  Fax: [phone] TeleHealth Visit:  Kristin Delgado has verbally consented to this TeleHealth visit today. The patient is located at home, the provider is located at the [facility] office. The participants in this visit include the listed provider, patient, Kristin Delgado and any and all parties involved. The visit was conducted today via WebEx.  HPI:   Chief Complaint: OBESITY Kristin Delgado is here to discuss her progress with her obesity treatment plan. She is on the keep a food journal with 1150 to 1300 calories and 80+ grams of protein daily plan and is following her eating plan approximately 70 % of the time. She states she is exercising 0 minutes 0 times per week. Kristin Delgado has had fluctuating weight, but she reports 164 pounds this morning (down 1 lb form previously). She is walking 4,000 to 5,000 steps, 5 times a week at work. The last few weeks has been stressful, prior to her daughter's departure to her husband's sisters house. Patient is getting 80 to 90 grams of protein a day. She is struggling to get her calories in and she is often around 1,000 to 1,100 calories. [name] has minimal hunger. We were unable to weigh the patient today for this TeleHealth visit. She feels as if she has lost weight since her last visit. She has lost 17 lbs since starting treatment with [name].  Hyperlipidemia Kristin Delgado has hyperlipidemia. She has LDL of 161 and HDL of 39.6 on 01/30/19. Kristin Delgado is on statin. She has been trying to improve her cholesterol levels with intensive lifestyle modification including a low saturated fat diet, exercise and weight loss. She denies any chest pain or myalgias.  Hypertension Kristin Delgado is a 63 y.o. female with hypertension. Her blood pressure was labile previously. She is on ACE and HCTZ. Kristin Delgado is not checking blood pressure at home. Kristin Delgado denies chest pain or shortness of breath on exertion. She is working weight loss to help control her  blood pressure with the goal of decreasing her risk of heart attack and stroke.   ASSESSMENT AND PLAN:  Other hyperlipidemia  Essential hypertension  Class 1 obesity with serious comorbidity and body mass index (BMI) of 31.0 to 31.9 in adult, unspecified obesity type  PLAN:  Hyperlipidemia Kristin Delgado was informed of the American Heart Association Guidelines emphasizing intensive lifestyle modifications as the first line treatment for hyperlipidemia. We discussed many lifestyle modifications today in depth, and Kristin Delgado will continue to work on decreasing saturated fats such as fatty red meat, butter and many fried foods. She will also increase vegetables and lean protein in her diet and continue to work on exercise and weight loss efforts. We will check labs at the next appointment.  Hypertension We discussed sodium restriction, working on healthy weight loss, and a regular exercise program as the means to achieve improved blood pressure control. Cia agreed with this plan and agreed to follow up as directed. We will continue to monitor her blood pressure as well as her progress with the above lifestyle modifications. She will continue her medications as prescribed and will watch for signs of hypotension as she continues her lifestyle modifications.  Obesity [name] is currently in the action stage of change. As such, her goal is to continue with weight loss efforts She has agreed to keep a food journal with 1150 to 1300 calories and 80+ grams of protein daily Kristin Delgado has been instructed to work up to a goal of 150 minutes of combined cardio and strengthening  exercise per week for weight loss and overall health benefits. We discussed the following Behavioral Modification Strategies today: planning for success, keeping healthy foods in the home, better snacking choices, increasing lean protein intake, increasing vegetables and work on meal planning and easy cooking plans  Kristin Delgado has agreed to  follow up with our clinic in 2 weeks. She was informed of the importance of frequent follow up visits to maximize her success with intensive lifestyle modifications for her multiple health conditions.  ALLERGIES: No Known Allergies  MEDICATIONS: Current Outpatient Medications on File Prior to Visit  Medication Sig Dispense Refill   acetaminophen (TYLENOL ARTHRITIS PAIN) 650 MG CR tablet Take 650 mg by mouth every 8 (eight) hours as needed. pain      Ascorbic Acid (VITAMIN C) 1000 MG tablet Take 1,000 mg by mouth 2 (two) times daily.       atorvastatin (LIPITOR) 20 MG tablet Take 1 tablet (20 mg total) by mouth daily. 90 tablet 1   b complex vitamins tablet Take 1 tablet by mouth daily.     bromocriptine (PARLODEL) 2.5 MG tablet Take 1 tablet (2.5 mg total) by mouth daily. 90 tablet 0   Calcium Polycarbophil (FIBER-CAPS PO) Take by mouth.     CALCIUM-MAGNESIUM-ZINC PO Take by mouth.     fenofibrate 160 MG tablet TAKE 1 TABLET DAILY 90 tablet 1   ferrous sulfate 325 (65 FE) MG EC tablet Take 325 mg by mouth 3 (three) times daily with meals.     glucosamine-chondroitin 500-400 MG tablet Take 1 tablet by mouth 3 (three) times daily.     hydrochlorothiazide (HYDRODIURIL) 12.5 MG tablet TAKE 2 TABLETS (25 MG TOTAL) BY MOUTH DAILY. 60 tablet 0   lisinopril (PRINIVIL,ZESTRIL) 10 MG tablet Take 0.5 tablets (5 mg total) by mouth daily. 45 tablet 1   Multiple Vitamin (MULTIVITAMIN) tablet Take 1 tablet by mouth daily.       Omega 3-6-9 Fatty Acids (OMEGA 3-6-9 COMPLEX PO) Take by mouth.     Omega-3 Fatty Acids (FISH OIL) 1200 MG CAPS Take 1 capsule by mouth 2 (two) times daily.     Probiotic Product (PROBIOTIC ADVANCED PO) Take by mouth.     ST JOHNS WORT PO Take by mouth.     famotidine (PEPCID AC) 10 MG chewable tablet Chew 10 mg by mouth daily.       polyethylene glycol powder (GLYCOLAX/MIRALAX) powder Take 17 g by mouth daily. (Patient not taking: Reported on 06/01/2019) 3350 g 0    No current facility-administered medications on file prior to visit.     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  Respiratory: Negative for shortness of breath (on exertion).   Cardiovascular: Negative for chest pain.  Musculoskeletal: Negative for myalgias.    PHYSICAL EXAM: Pt in no acute distress  RECENT LABS AND TESTS: BMET    Component Value Date/Time   NA 143 01/23/2019 1518   K 4.3 01/23/2019 1518   CL 105 01/23/2019 1518   CO2 25 01/23/2019 1518   GLUCOSE 86 01/23/2019 1518   GLUCOSE 110 (H) 08/01/2018 0932   BUN 21 01/23/2019 1518   CREATININE 0.59 01/23/2019 1518   CALCIUM 9.5 01/23/2019 1518   GFRNONAA 97 01/23/2019 1518   GFRAA 112 01/23/2019 1518   Lab Results  Component Value Date   HGBA1C 5.3 01/23/2019   HGBA1C 5.6 10/12/2018   HGBA1C 6.1 01/28/2017   Lab Results  Component Value Date    INSULIN 6.2 01/23/2019   INSULIN 5.9 10/12/2018   CBC    Component Value Date/Time   WBC 6.5 01/23/2019 1518   WBC 6.4 02/08/2018 1125   RBC 4.61 01/23/2019 1518   RBC 4.21 02/08/2018 1125   HGB 11.5 01/23/2019 1518   HCT 38.0 01/23/2019 1518   PLT 243 10/12/2018 1325   MCV 82 01/23/2019 1518   MCH 24.9 (L) 01/23/2019 1518   MCH 26.4 (L) 02/08/2018 1125   MCHC 30.3 (L) 01/23/2019 1518   MCHC 33.2 02/08/2018 1125   RDW 14.4 01/23/2019 1518   LYMPHSABS 2.1 01/23/2019 1518   MONOABS 0.4 07/26/2017 1028   EOSABS 0.1 01/23/2019 1518   BASOSABS 0.0 01/23/2019 1518   Iron/TIBC/Ferritin/ %Sat    Component Value Date/Time   IRON 52 12/12/2007 1500   IRONPCTSAT 14.6 (L) 12/12/2007 1500   Lipid Panel     Component Value Date/Time   CHOL 237 (H) 01/30/2019 0932   TRIG 180.0 (H) 01/30/2019 0932   HDL 39.60 01/30/2019 0932   CHOLHDL 6 01/30/2019 0932   VLDL 36.0 01/30/2019 0932   LDLCALC 161 (H) 01/30/2019 0932   LDLDIRECT 83.0 01/28/2017 1011   Hepatic Function Panel     Component Value Date/Time   PROT 6.3 01/23/2019 1518   ALBUMIN 4.2 01/23/2019 1518   AST 31 01/23/2019 1518   ALT 46 (H) 01/23/2019 1518   ALKPHOS 59 01/23/2019 1518   BILITOT 0.2 01/23/2019 1518   BILIDIR 0.0 05/02/2015 0823      Component Value Date/Time   TSH 1.830 10/12/2018 1325   TSH 2.58 04/06/2016 1408   TSH 2.86 07/16/2014 1051     Ref. Range 01/23/2019 15:18  Vitamin D, 25-Hydroxy Latest Ref Range: 30.0 - 100.0 ng/mL 52.5    I, Doreene Nest, am acting as Location manager for Eber Jones, MD  I have reviewed the above documentation for accuracy and completeness, and I agree with the above. - Ilene Qua, MD

## 2019-06-18 ENCOUNTER — Other Ambulatory Visit (INDEPENDENT_AMBULATORY_CARE_PROVIDER_SITE_OTHER): Payer: Self-pay | Admitting: Family Medicine

## 2019-06-18 DIAGNOSIS — I10 Essential (primary) hypertension: Secondary | ICD-10-CM

## 2019-06-22 ENCOUNTER — Encounter (INDEPENDENT_AMBULATORY_CARE_PROVIDER_SITE_OTHER): Payer: Self-pay | Admitting: Family Medicine

## 2019-06-22 ENCOUNTER — Ambulatory Visit (INDEPENDENT_AMBULATORY_CARE_PROVIDER_SITE_OTHER): Payer: Medicare Other | Admitting: Family Medicine

## 2019-06-22 ENCOUNTER — Other Ambulatory Visit: Payer: Self-pay

## 2019-06-22 VITALS — BP 123/78 | HR 60 | Temp 98.2°F | Ht 63.0 in | Wt 161.0 lb

## 2019-06-22 DIAGNOSIS — I10 Essential (primary) hypertension: Secondary | ICD-10-CM | POA: Diagnosis not present

## 2019-06-22 DIAGNOSIS — E7849 Other hyperlipidemia: Secondary | ICD-10-CM

## 2019-06-22 DIAGNOSIS — Z6830 Body mass index (BMI) 30.0-30.9, adult: Secondary | ICD-10-CM

## 2019-06-22 DIAGNOSIS — E669 Obesity, unspecified: Secondary | ICD-10-CM | POA: Diagnosis not present

## 2019-06-22 DIAGNOSIS — R7303 Prediabetes: Secondary | ICD-10-CM | POA: Diagnosis not present

## 2019-06-22 NOTE — Progress Notes (Signed)
Office: [phone]  /  Fax: [phone]   HPI:   Chief Complaint: OBESITY Kristin Delgado is here to discuss her progress with her obesity treatment plan. She is on the Category 2 plan and is following her eating plan approximately 80% of the time. She states she is walking 5,000 steps and lifting at work 5 times per week. Kristin Delgado has had a relaxing few weeks with her daughters being away and has some increase in stress with their impending return. She is actively working on getting up to 1300 calories but often averaging approximately 1050 calories. Her weight is 161 lb (73 kg) today and has had a weight loss of 12 pounds over a period of 5 months since her last in-office visit. She has lost 28 lbs since starting treatment with [name].  Hyperlipidemia Kristin Delgado has hyperlipidemia and is on a statin. She has been trying to improve her cholesterol levels with intensive lifestyle modification including a low saturated fat diet, exercise and weight loss. Her last LDL was 161 on 01/30/2019 and HDL was 39.6. She denies any myalgias.  Hypertension Kristin Delgado is a 63 y.o. female with hypertension and is on HCTZ and lisinopril.  Kristin Delgado denies chest pain, chest pressure, or headache. She is working weight loss to help control her blood pressure with the goal of decreasing her risk of heart attack and stroke. Kristin Delgado' blood pressure is controlled today.  Pre-Diabetes Kristin Delgado has a diagnosis of prediabetes based on her elevated Hgb A1c and was informed this puts her at greater risk of developing diabetes. Her Hgb A1c was previously controlled at 5.3 with an insulin of 6.2. She is not taking metformin currently and continues to work on diet and exercise to decrease risk of diabetes. She denies nausea or hypoglycemia.  ASSESSMENT AND PLAN:  Other hyperlipidemia - Plan: Lipid Panel With LDL/HDL Ratio  Essential hypertension  Prediabetes - Plan: Comprehensive metabolic panel, Hemoglobin A1c, Insulin,  random  Class 1 obesity with serious comorbidity and body mass index (BMI) of 30.0 to 30.9 in adult, unspecified obesity type  PLAN:  Hyperlipidemia Kristin Delgado was informed of the American Heart Association Guidelines emphasizing intensive lifestyle modifications as the first line treatment for hyperlipidemia. We discussed many lifestyle modifications today in depth, and Kristin Delgado will continue to work on decreasing saturated fats such as fatty red meat, butter and many fried foods. She will also increase vegetables and lean protein in her diet and continue to work on exercise and weight loss efforts. She will have FLP drawn today.  Hypertension We discussed sodium restriction, working on healthy weight loss, and a regular exercise program as the means to achieve improved blood pressure control. Kristin Delgado agreed with this plan and agreed to follow up as directed. We will continue to monitor her blood pressure as well as her progress with the above lifestyle modifications. She will continue her medications as prescribed and will watch for signs of hypotension as she continues her lifestyle modifications. She will have CMP drawn today.  Pre-Diabetes Kristin Delgado will continue to work on weight loss, exercise, and decreasing simple carbohydrates in her diet to help decrease the risk of diabetes. We dicussed metformin including benefits and risks. She was informed that eating too many simple carbohydrates or too many calories at one sitting increases the likelihood of GI side effects. Kristin Delgado will have HgA1c and insulin drawn today. She will follow-up with [name] as directed to monitor her progress.  Obesity Kristin Delgado is currently in the action stage of change. As  such, her goal is to continue with weight loss efforts. She has agreed to keep a food journal with 1150-1300 calories and 80+ grams of protein daily. Kristin Delgado has been instructed to work up to a goal of 150 minutes of combined cardio and strengthening exercise  per week for weight loss and overall health benefits. We discussed the following Behavioral Modification Strategies today: increasing lean protein intake, increasing vegetables, work on meal planning and easy cooking plans, keeping healthy foods in the home, and planning for success.  Kristin Delgado has agreed to follow-up with our clinic in 3 weeks. She was informed of the importance of frequent follow-up visits to maximize her success with intensive lifestyle modifications for her multiple health conditions.  ALLERGIES: No Known Allergies  MEDICATIONS: Current Outpatient Medications on File Prior to Visit  Medication Sig Dispense Refill  . acetaminophen (TYLENOL ARTHRITIS PAIN) 650 MG CR tablet Take 650 mg by mouth every 8 (eight) hours as needed. pain     . Ascorbic Acid (VITAMIN C) 1000 MG tablet Take 1,000 mg by mouth 2 (two) times daily.      [name] atorvastatin (LIPITOR) 20 MG tablet Take 1 tablet (20 mg total) by mouth daily. 90 tablet 1  . b complex vitamins tablet Take 1 tablet by mouth daily.    . bromocriptine (PARLODEL) 2.5 MG tablet Take 1 tablet (2.5 mg total) by mouth daily. 90 tablet 0  . Calcium Polycarbophil (FIBER-CAPS PO) Take by mouth.    [name] CALCIUM-MAGNESIUM-ZINC PO Take by mouth.    . famotidine (PEPCID AC) 10 MG chewable tablet Chew 10 mg by mouth daily.      . fenofibrate 160 MG tablet TAKE 1 TABLET DAILY 90 tablet 1  . ferrous sulfate 325 (65 FE) MG EC tablet Take 325 mg by mouth 3 (three) times daily with meals.    [name] glucosamine-chondroitin 500-400 MG tablet Take 1 tablet by mouth 3 (three) times daily.    . hydrochlorothiazide (HYDRODIURIL) 12.5 MG tablet TAKE 2 TABLETS (25 MG TOTAL) BY MOUTH DAILY. 60 tablet 0  . lisinopril (PRINIVIL,ZESTRIL) 10 MG tablet Take 0.5 tablets (5 mg total) by mouth daily. 45 tablet 1  . Multiple Vitamin (MULTIVITAMIN) tablet Take 1 tablet by mouth daily.      . Omega 3-6-9 Fatty Acids (OMEGA 3-6-9 COMPLEX PO) Take by mouth.    . Omega-3 Fatty  Acids (FISH OIL) 1200 MG CAPS Take 1 capsule by mouth 2 (two) times daily.    . polyethylene glycol powder (GLYCOLAX/MIRALAX) powder Take 17 g by mouth daily. 3350 g 0  . Probiotic Product (PROBIOTIC ADVANCED PO) Take by mouth.    . ST JOHNS WORT PO Take by mouth.     No current facility-administered medications on file prior to visit.     PAST MEDICAL HISTORY: Past Medical History:  Diagnosis Date  . Allergy    seasonal  . Anxiety    closed space like MRI  . Back pain \  . Chest pain 2006   admitted, essentially negative  . Constipation   . Depression   . GERD (gastroesophageal reflux disease)   . Hyperlipidemia   . Hypertension   . Infertility, female   . Joint pain   . Pituitary microadenoma ([place])   . Swallowing difficulty   . 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 Vitamin D deficiency     PAST SURGICAL HISTORY: Past Surgical History:  Procedure Laterality Date  . 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  BRAIN SURGERY  1 / 1985   microadenoma  . COCHLEAR IMPLANT  02/2013   Left side    SOCIAL HISTORY: Social History   Tobacco Use  . Smoking status: Never Smoker  . Smokeless tobacco: Never Used  Substance Use Topics  . Alcohol use: No    Comment: rare  . Drug use: No    FAMILY HISTORY: Family History  Problem Relation Age of Onset  . Lung cancer Mother   . Cancer Mother 53       lung   . Alzheimer's disease Father   . Dementia Father    ROS: Review of Systems  Cardiovascular: Negative for chest pain.       Negative for chest pressure.  Musculoskeletal: Negative for myalgias.  Neurological: Negative for headaches.   PHYSICAL EXAM: Blood pressure 123/78, pulse 60, temperature 98.2 F (36.8 C), temperature source Oral, height 5\' 3"  (1.6 m), weight 161 lb (73 kg), SpO2 99 %. Body mass index is 28.52 kg/m. Physical Exam Vitals signs reviewed.  Constitutional:      Appearance: Normal appearance. She is obese.  Cardiovascular:     Rate and Rhythm:  Normal rate.     Pulses: Normal pulses.  Pulmonary:     Effort: Pulmonary effort is normal.     Breath sounds: Normal breath sounds.  Musculoskeletal: Normal range of motion.  Skin:    General: Skin is warm and dry.  Neurological:     Mental Status: She is alert and oriented to person, place, and time.  Psychiatric:        Behavior: Behavior normal.   RECENT LABS AND TESTS: BMET    Component Value Date/Time   NA 143 01/23/2019 1518   K 4.3 01/23/2019 1518   CL 105 01/23/2019 1518   CO2 25 01/23/2019 1518   GLUCOSE 86 01/23/2019 1518   GLUCOSE 110 (H) 08/01/2018 0932   BUN 21 01/23/2019 1518   CREATININE 0.59 01/23/2019 1518   CALCIUM 9.5 01/23/2019 1518   GFRNONAA 97 01/23/2019 1518   GFRAA 112 01/23/2019 1518   Lab Results  Component Value Date   HGBA1C 5.3 01/23/2019   HGBA1C 5.6 10/12/2018   HGBA1C 6.1 01/28/2017   Lab Results  Component Value Date   INSULIN 6.2 01/23/2019   INSULIN 5.9 10/12/2018   CBC    Component Value Date/Time   WBC 6.5 01/23/2019 1518   WBC 6.4 02/08/2018 1125   RBC 4.61 01/23/2019 1518   RBC 4.21 02/08/2018 1125   HGB 11.5 01/23/2019 1518   HCT 38.0 01/23/2019 1518   PLT 243 10/12/2018 1325   MCV 82 01/23/2019 1518   MCH 24.9 (L) 01/23/2019 1518   MCH 26.4 (L) 02/08/2018 1125   MCHC 30.3 (L) 01/23/2019 1518   MCHC 33.2 02/08/2018 1125   RDW 14.4 01/23/2019 1518   LYMPHSABS 2.1 01/23/2019 1518   MONOABS 0.4 07/26/2017 1028   EOSABS 0.1 01/23/2019 1518   BASOSABS 0.0 01/23/2019 1518   Iron/TIBC/Ferritin/ %Sat    Component Value Date/Time   IRON 52 12/12/2007 1500   IRONPCTSAT 14.6 (L) 12/12/2007 1500   Lipid Panel     Component Value Date/Time   CHOL 237 (H) 01/30/2019 0932   TRIG 180.0 (H) 01/30/2019 0932   HDL 39.60 01/30/2019 0932   CHOLHDL 6 01/30/2019 0932   VLDL 36.0 01/30/2019 0932   LDLCALC 161 (H) 01/30/2019 0932   LDLDIRECT 83.0 01/28/2017 1011   Hepatic Function Panel     Component  Value Date/Time    PROT 6.3 01/23/2019 1518   ALBUMIN 4.2 01/23/2019 1518   AST 31 01/23/2019 1518   ALT 46 (H) 01/23/2019 1518   ALKPHOS 59 01/23/2019 1518   BILITOT 0.2 01/23/2019 1518   BILIDIR 0.0 05/02/2015 0823      Component Value Date/Time   TSH 1.830 10/12/2018 1325   TSH 2.58 04/06/2016 1408   TSH 2.86 07/16/2014 1051   Results for KAYELEE, HERBIG (MRN [id number]) as of 06/22/2019 10:44  Ref. Range 01/23/2019 15:18  Vitamin D, 25-Hydroxy Latest Ref Range: 30.0 - 100.0 ng/mL 52.5   OBESITY BEHAVIORAL INTERVENTION VISIT  Today's visit was #16  Starting weight: 189 lbs Starting date: 10/12/2018 Today's weight: 161 lbs Today's date: 06/22/2019 Total lbs lost to date: 28 At least 15 minutes were spent on discussing the following behavioral intervention visit.    06/22/2019  Height 5\' 3"  (1.6 m)  Weight 161 lb (73 kg)  BMI (Calculated) 28.53  BLOOD PRESSURE - SYSTOLIC 370  BLOOD PRESSURE - DIASTOLIC 78   Body Fat % 48.8 %  Total Body Water (lbs) 68 lbs   ASK: We discussed the diagnosis of obesity with Kristin Delgado today and Kristin Delgado agreed to give [name] permission to discuss obesity behavioral modification therapy today.  ASSESS: Kristin Delgado has the diagnosis of obesity and her BMI today is 28.6. Kristin Delgado is in the action stage of change.   ADVISE: Kristin Delgado was educated on the multiple health risks of obesity as well as the benefit of weight loss to improve her health. She was advised of the need for long term treatment and the importance of lifestyle modifications to improve her current health and to decrease her risk of future health problems.  AGREE: Multiple dietary modification options and treatment options were discussed and  Kristin Delgado agreed to follow the recommendations documented in the above note.  ARRANGE: Kristin Delgado was educated on the importance of frequent visits to treat obesity as outlined per CMS and USPSTF guidelines and agreed to schedule her next follow up appointment today.  I,  Michaelene Song, am acting as transcriptionist for Ilene Qua, MD  I have reviewed the above documentation for accuracy and completeness, and I agree with the above. - Ilene Qua, MD

## 2019-06-23 LAB — COMPREHENSIVE METABOLIC PANEL
ALT: 60 IU/L — ABNORMAL HIGH (ref 0–32)
AST: 36 IU/L (ref 0–40)
Albumin/Globulin Ratio: 2.1 (ref 1.2–2.2)
Albumin: 4.5 g/dL (ref 3.8–4.8)
Alkaline Phosphatase: 58 IU/L (ref 39–117)
BUN/Creatinine Ratio: 34 — ABNORMAL HIGH (ref 12–28)
BUN: 25 mg/dL (ref 8–27)
Bilirubin Total: 0.3 mg/dL (ref 0.0–1.2)
CO2: 24 mmol/L (ref 20–29)
Calcium: 10 mg/dL (ref 8.7–10.3)
Chloride: 100 mmol/L (ref 96–106)
Creatinine, Ser: 0.74 mg/dL (ref 0.57–1.00)
GFR calc Af Amer: 99 mL/min/{1.73_m2} (ref >59)
GFR calc non Af Amer: 86 mL/min/{1.73_m2} (ref >59)
Globulin, Total: 2.1 g/dL (ref 1.5–4.5)
Glucose: 91 mg/dL (ref 65–99)
Potassium: 3.9 mmol/L (ref 3.5–5.2)
Sodium: 142 mmol/L (ref 134–144)
Total Protein: 6.6 g/dL (ref 6.0–8.5)

## 2019-06-23 LAB — HEMOGLOBIN A1C
Est. average glucose Bld gHb Est-mCnc: 103 mg/dL
Hgb A1c MFr Bld: 5.2 % (ref 4.8–5.6)

## 2019-06-23 LAB — LIPID PANEL WITH LDL/HDL RATIO
Cholesterol, Total: 170 mg/dL (ref 100–199)
HDL: 46 mg/dL (ref >39)
LDL Calculated: 103 mg/dL — ABNORMAL HIGH (ref 0–99)
LDl/HDL Ratio: 2.2 ratio (ref 0.0–3.2)
Triglycerides: 103 mg/dL (ref 0–149)
VLDL Cholesterol Cal: 21 mg/dL (ref 5–40)

## 2019-06-23 LAB — INSULIN, RANDOM: INSULIN: 6.7 u[IU]/mL (ref 2.6–24.9)

## 2019-07-11 ENCOUNTER — Encounter (INDEPENDENT_AMBULATORY_CARE_PROVIDER_SITE_OTHER): Payer: Self-pay | Admitting: Family Medicine

## 2019-07-11 ENCOUNTER — Ambulatory Visit (INDEPENDENT_AMBULATORY_CARE_PROVIDER_SITE_OTHER): Payer: Medicare Other | Admitting: Family Medicine

## 2019-07-11 ENCOUNTER — Other Ambulatory Visit: Payer: Self-pay

## 2019-07-11 VITALS — BP 122/77 | HR 73 | Temp 98.1°F | Ht 63.0 in | Wt 164.0 lb

## 2019-07-11 DIAGNOSIS — E669 Obesity, unspecified: Secondary | ICD-10-CM

## 2019-07-11 DIAGNOSIS — I10 Essential (primary) hypertension: Secondary | ICD-10-CM | POA: Diagnosis not present

## 2019-07-11 DIAGNOSIS — K5909 Other constipation: Secondary | ICD-10-CM | POA: Diagnosis not present

## 2019-07-11 DIAGNOSIS — Z6830 Body mass index (BMI) 30.0-30.9, adult: Secondary | ICD-10-CM | POA: Diagnosis not present

## 2019-07-12 NOTE — Progress Notes (Signed)
Office: [phone]  /  Fax: [phone]   HPI:   Chief Complaint: OBESITY Kristin Delgado is here to discuss her progress with her obesity treatment plan. She is on the keep a food journal with 1150 to 1300 calories and 80+ grams of protein daily and is following her eating plan approximately 75 % of the time. She states she is walking 5,000 steps 5 times per week. Kristin Delgado mentions increased stress with her daughter moving home and her other daughter moving to college. Kristin Delgado does mention work has been more busy. Kristin Delgado thinks she is constipated currently. She finds journaling to be the easiest to follow.  Her weight is 164 lb (74.4 kg) today and has had a weight gain of 3 pounds over a period of 3 weeks since her last visit. She has lost 25 lbs since starting treatment with [name].  Constipation Kristin Delgado notes constipation for the last few weeks, worse since attempting weight loss. She states BM are 1 to 2 times per week and are normally 3 to 4 times per week. She has increased fiber, but not with  In volume. She is doing fiber capsules (8/day).  Hypertension Kristin Delgado is a 63 y.o. female with hypertension. Kristin Delgado is on Lisinopril and HCTZ.  Kristin Delgado denies chest pain. She is working weight loss to help control her blood pressure with the goal of decreasing her risk of heart attack and stroke. Kristin Delgado blood pressure is controlled at 122/77.  ASSESSMENT AND PLAN:  Other constipation  Essential hypertension  Class 1 obesity with serious comorbidity and body mass index (BMI) of 30.0 to 30.9 in adult, unspecified obesity type  PLAN:  Constipation Kristin Delgado was informed decrease bowel movement frequency is normal while losing weight, but stools should not be hard or painful. Kristin Delgado will try stool softener for 3 to 4 days, then she will try 1 to 2 days of an enema.  Hypertension We discussed sodium restriction, working on healthy weight loss, and a regular exercise program as the means  to achieve improved blood pressure control. Kristin Delgado agreed with this plan and agreed to follow up as directed. We will continue to monitor her blood pressure as well as her progress with the above lifestyle modifications. Kristin Delgado will continue her medications as prescribed (no change) and she will watch for signs of hypotension as she continues her lifestyle modifications.  Obesity Kristin Delgado is currently in the action stage of change. As such, her goal is to continue with weight loss efforts She has agreed to keep a food journal with 1150 to 1300 calories and 80+ grams of protein daily Kristin Delgado will do an additional 2 days of 10 to 15 minutes resistance training for weight loss and overall health benefits. We discussed the following Behavioral Modification Strategies today: planning for success, keeping healthy foods in the home, increasing lean protein intake, increasing vegetables and work on meal planning and easy cooking plans  Kristin Delgado has agreed to follow up with our clinic in 2 weeks. She was informed of the importance of frequent follow up visits to maximize her success with intensive lifestyle modifications for her multiple health conditions.  ALLERGIES: No Known Allergies  MEDICATIONS: Current Outpatient Medications on File Prior to Visit  Medication Sig Dispense Refill  . acetaminophen (TYLENOL ARTHRITIS PAIN) 650 MG CR tablet Take 650 mg by mouth every 8 (eight) hours as needed. pain     . Ascorbic Acid (VITAMIN C) 1000 MG tablet Take 1,000 mg by mouth 2 (two) times daily.      [name]  atorvastatin (LIPITOR) 20 MG tablet Take 1 tablet (20 mg total) by mouth daily. 90 tablet 1  . b complex vitamins tablet Take 1 tablet by mouth daily.    . bromocriptine (PARLODEL) 2.5 MG tablet Take 1 tablet (2.5 mg total) by mouth daily. 90 tablet 0  . Calcium Polycarbophil (FIBER-CAPS PO) Take by mouth.    [name] CALCIUM-MAGNESIUM-ZINC PO Take by mouth.    . famotidine (PEPCID AC) 10 MG chewable tablet Chew 10  mg by mouth daily.      . fenofibrate 160 MG tablet TAKE 1 TABLET DAILY 90 tablet 1  . ferrous sulfate 325 (65 FE) MG EC tablet Take 325 mg by mouth 3 (three) times daily with meals.    [name] glucosamine-chondroitin 500-400 MG tablet Take 1 tablet by mouth 3 (three) times daily.    . hydrochlorothiazide (HYDRODIURIL) 12.5 MG tablet TAKE 2 TABLETS (25 MG TOTAL) BY MOUTH DAILY. 60 tablet 0  . lisinopril (PRINIVIL,ZESTRIL) 10 MG tablet Take 0.5 tablets (5 mg total) by mouth daily. 45 tablet 1  . Multiple Vitamin (MULTIVITAMIN) tablet Take 1 tablet by mouth daily.      . Omega 3-6-9 Fatty Acids (OMEGA 3-6-9 COMPLEX PO) Take by mouth.    . Omega-3 Fatty Acids (FISH OIL) 1200 MG CAPS Take 1 capsule by mouth 2 (two) times daily.    . Probiotic Product (PROBIOTIC ADVANCED PO) Take by mouth.    . ST JOHNS WORT PO Take by mouth.    . polyethylene glycol powder (GLYCOLAX/MIRALAX) powder Take 17 g by mouth daily. (Patient not taking: Reported on 07/11/2019) 3350 g 0   No current facility-administered medications on file prior to visit.     PAST MEDICAL HISTORY: Past Medical History:  Diagnosis Date  . Allergy    seasonal  . Anxiety    closed space like MRI  . Back pain \  . Chest pain 2006   admitted, essentially negative  . Constipation   . Depression   . GERD (gastroesophageal reflux disease)   . Hyperlipidemia   . Hypertension   . Infertility, female   . Joint pain   . Pituitary microadenoma ([place])   . Swallowing difficulty   . 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 Vitamin D deficiency     PAST SURGICAL HISTORY: Past Surgical History:  Procedure Laterality Date  . 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 BRAIN SURGERY  1 / 1985   microadenoma  . COCHLEAR IMPLANT  02/2013   Left side    SOCIAL HISTORY: Social History   Tobacco Use  . Smoking status: Never Smoker  . Smokeless tobacco: Never Used  Substance Use Topics  . Alcohol use: No    Comment: rare  . Drug use: No    FAMILY  HISTORY: Family History  Problem Relation Age of Onset  . Lung cancer Mother   . Cancer Mother 83       lung   . Alzheimer's disease Father   . Dementia Father     ROS: Review of Systems  Constitutional: Negative for weight loss.  Cardiovascular: Negative for chest pain.  Gastrointestinal: Positive for constipation.    PHYSICAL EXAM: Blood pressure 122/77, pulse 73, temperature 98.1 F (36.7 C), temperature source Oral, height 5\' 3"  (1.6 m), weight 164 lb (74.4 kg), SpO2 97 %. Body mass index is 29.05 kg/m. Physical Exam Vitals signs reviewed.  Constitutional:      Appearance: Normal appearance. She is well-developed. She is  obese.  Cardiovascular:     Rate and Rhythm: Normal rate.  Pulmonary:     Effort: Pulmonary effort is normal.  Musculoskeletal: Normal range of motion.  Skin:    General: Skin is warm and dry.  Neurological:     Mental Status: She is alert and oriented to person, place, and time.  Psychiatric:        Mood and Affect: Mood normal.        Behavior: Behavior normal.     RECENT LABS AND TESTS: BMET    Component Value Date/Time   NA 142 06/22/2019 0907   K 3.9 06/22/2019 0907   CL 100 06/22/2019 0907   CO2 24 06/22/2019 0907   GLUCOSE 91 06/22/2019 0907   GLUCOSE 110 (H) 08/01/2018 0932   BUN 25 06/22/2019 0907   CREATININE 0.74 06/22/2019 0907   CALCIUM 10.0 06/22/2019 0907   GFRNONAA 86 06/22/2019 0907   GFRAA 99 06/22/2019 0907   Lab Results  Component Value Date   HGBA1C 5.2 06/22/2019   HGBA1C 5.3 01/23/2019   HGBA1C 5.6 10/12/2018   HGBA1C 6.1 01/28/2017   Lab Results  Component Value Date   INSULIN 6.7 06/22/2019   INSULIN 6.2 01/23/2019   INSULIN 5.9 10/12/2018   CBC    Component Value Date/Time   WBC 6.5 01/23/2019 1518   WBC 6.4 02/08/2018 1125   RBC 4.61 01/23/2019 1518   RBC 4.21 02/08/2018 1125   HGB 11.5 01/23/2019 1518   HCT 38.0 01/23/2019 1518   PLT 243 10/12/2018 1325   MCV 82 01/23/2019 1518   MCH 24.9  (L) 01/23/2019 1518   MCH 26.4 (L) 02/08/2018 1125   MCHC 30.3 (L) 01/23/2019 1518   MCHC 33.2 02/08/2018 1125   RDW 14.4 01/23/2019 1518   LYMPHSABS 2.1 01/23/2019 1518   MONOABS 0.4 07/26/2017 1028   EOSABS 0.1 01/23/2019 1518   BASOSABS 0.0 01/23/2019 1518   Iron/TIBC/Ferritin/ %Sat    Component Value Date/Time   IRON 52 12/12/2007 1500   IRONPCTSAT 14.6 (L) 12/12/2007 1500   Lipid Panel     Component Value Date/Time   CHOL 170 06/22/2019 0907   TRIG 103 06/22/2019 0907   HDL 46 06/22/2019 0907   CHOLHDL 6 01/30/2019 0932   VLDL 36.0 01/30/2019 0932   LDLCALC 103 (H) 06/22/2019 0907   LDLDIRECT 83.0 01/28/2017 1011   Hepatic Function Panel     Component Value Date/Time   PROT 6.6 06/22/2019 0907   ALBUMIN 4.5 06/22/2019 0907   AST 36 06/22/2019 0907   ALT 60 (H) 06/22/2019 0907   ALKPHOS 58 06/22/2019 0907   BILITOT 0.3 06/22/2019 0907   BILIDIR 0.0 05/02/2015 0823      Component Value Date/Time   TSH 1.830 10/12/2018 1325   TSH 2.58 04/06/2016 1408   TSH 2.86 07/16/2014 1051     Ref. Range 01/23/2019 15:18  Vitamin D, 25-Hydroxy Latest Ref Range: 30.0 - 100.0 ng/mL 52.5    OBESITY BEHAVIORAL INTERVENTION VISIT  Today's visit was # 17   Starting weight: 189 lbs Starting date: 10/12/2018 Today's weight : 164 lbs Today's date: 07/11/2019 Total lbs lost to date: 25    07/11/2019  Height 5\' 3"  (1.6 m)  Weight 164 lb (74.4 kg)  BMI (Calculated) 29.06  BLOOD PRESSURE - SYSTOLIC [id number]  BLOOD PRESSURE - DIASTOLIC 77   Body Fat % 0000000 %  Total Body Water (lbs) 69.6 lbs    ASK: We discussed the diagnosis of obesity  with Veverly Fells today and Britnee agreed to give [name] permission to discuss obesity behavioral modification therapy today.  ASSESS: Kelen has the diagnosis of obesity and her BMI today is 29.06 Nayzeth is in the action stage of change   ADVISE: Angele was educated on the multiple health risks of obesity as well as the benefit of weight  loss to improve her health. She was advised of the need for long term treatment and the importance of lifestyle modifications to improve her current health and to decrease her risk of future health problems.  AGREE: Multiple dietary modification options and treatment options were discussed and  Sevanah agreed to follow the recommendations documented in the above note.  ARRANGE: Flynn was educated on the importance of frequent visits to treat obesity as outlined per CMS and USPSTF guidelines and agreed to schedule her next follow up appointment today.  I, Doreene Nest, am acting as transcriptionist for Eber Jones, MD  I have reviewed the above documentation for accuracy and completeness, and I agree with the above. - Ilene Qua, MD

## 2019-07-25 ENCOUNTER — Ambulatory Visit (INDEPENDENT_AMBULATORY_CARE_PROVIDER_SITE_OTHER): Payer: Medicare Other | Admitting: Family Medicine

## 2019-08-07 ENCOUNTER — Ambulatory Visit (INDEPENDENT_AMBULATORY_CARE_PROVIDER_SITE_OTHER): Payer: Medicare Other | Admitting: Family Medicine

## 2019-08-07 ENCOUNTER — Other Ambulatory Visit: Payer: Self-pay

## 2019-08-07 ENCOUNTER — Encounter: Payer: Self-pay | Admitting: Family Medicine

## 2019-08-07 VITALS — BP 125/80 | HR 63 | Temp 98.2°F | Ht 63.0 in | Wt 169.0 lb

## 2019-08-07 DIAGNOSIS — I10 Essential (primary) hypertension: Secondary | ICD-10-CM | POA: Diagnosis not present

## 2019-08-07 DIAGNOSIS — E7849 Other hyperlipidemia: Secondary | ICD-10-CM | POA: Diagnosis not present

## 2019-08-07 DIAGNOSIS — Z6830 Body mass index (BMI) 30.0-30.9, adult: Secondary | ICD-10-CM | POA: Diagnosis not present

## 2019-08-07 DIAGNOSIS — E669 Obesity, unspecified: Secondary | ICD-10-CM

## 2019-08-07 MED ORDER — HYDROCHLOROTHIAZIDE 12.5 MG PO TABS: 12.5000 mg | ORAL_TABLET | Freq: Every day | ORAL | 0 refills | Status: DC

## 2019-08-07 NOTE — Progress Notes (Addendum)
Subjective:   Kristin Delgado is a 63 y.o. female who presents for Medicare Annual (Subsequent) preventive examination.  Works at [employer] per week.   Review of Systems:  Home Safety/Smoke Alarms: Feels safe in home. Smoke alarms in place.  Lives w/ husband and 2 teen children in 1 story home.   Female:   Pap-01/2016       Mammo-  ordered  [facility]- 08/12/11. Dr. Ardis Hughs. Normal. 53yr recall     Objective:     Vitals: BP 124/70 (BP Location: Left Arm, Patient Position: Sitting, Cuff Size: Normal)   Pulse 60   Ht 5\' 3"  (1.6 m)   Wt 172 lb (78 kg)   SpO2 98%   BMI 30.47 kg/m   Body mass index is 30.47 kg/m.  Advanced Directives 08/08/2019 01/24/2018  Does Patient Have a Medical Advance Directive? No No  Would patient like information on creating a medical advance directive? No - Patient declined Yes (MAU/Ambulatory/Procedural Areas - Information given)    Tobacco Social History   Tobacco Use  Smoking Status Never Smoker  Smokeless Tobacco Never Used     Counseling given: Not Answered   Clinical Intake: Pain : No/denies pain    Past Medical History:  Diagnosis Date  . Allergy    seasonal  . Anxiety    closed space like MRI  . Back pain \  . Chest pain 2006   admitted, essentially negative  . Constipation   . Depression   . GERD (gastroesophageal reflux disease)   . Hyperlipidemia   . Hypertension   . Infertility, female   . Joint pain   . Pituitary microadenoma ([place])   . Swallowing difficulty   . 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 Vitamin D deficiency    Past Surgical History:  Procedure Laterality Date  . 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 BRAIN SURGERY  1 / 1985   microadenoma  . COCHLEAR IMPLANT  02/2013   Left side   Family History  Problem Relation Age of Onset  . Lung cancer Mother   . Cancer Mother 69       lung   . Alzheimer's disease Father   . Dementia Father    Social History   Socioeconomic History  . Marital status: Married     Spouse name: Kristin Delgado  . Number of children: 3  . Years of education: Not on file  . Highest education level: Not on file  Occupational History  . Occupation: [employer]  Social Needs  . Financial resource strain: Not on file  . Food insecurity    Worry: Not on file    Inability: Not on file  . Transportation needs    Medical: Not on file    Non-medical: Not on file  Tobacco Use  . Smoking status: Never Smoker  . Smokeless tobacco: Never Used  Substance and Sexual Activity  . Alcohol use: No    Comment: rare  . Drug use: No  . Sexual activity: Not Currently    Partners: Male  Lifestyle  . Physical activity    Days per week: Not on file    Minutes per session: Not on file  . Stress: Not on file  Relationships  . Social Herbalist on phone: Not on file    Gets together: Not on file    Attends religious service: Not on file    Active member of club or organization: Not on file  Attends meetings of clubs or organizations: Not on file    Relationship status: Not on file  Other Topics Concern  . Not on file  Social History Narrative   Exercise-- daily --- walking, 3x a week DVD    Outpatient Encounter Medications as of 08/08/2019  Medication Sig  . acetaminophen (TYLENOL ARTHRITIS PAIN) 650 MG CR tablet Take 650 mg by mouth every 8 (eight) hours as needed. pain   . Ascorbic Acid (VITAMIN C) 1000 MG tablet Take 1,000 mg by mouth 2 (two) times daily.    [name] atorvastatin (LIPITOR) 20 MG tablet Take 1 tablet (20 mg total) by mouth daily.  [name] b complex vitamins tablet Take 1 tablet by mouth daily.  . bromocriptine (PARLODEL) 2.5 MG tablet Take 1 tablet (2.5 mg total) by mouth daily.  . Calcium Polycarbophil (FIBER-CAPS PO) Take by mouth.  [name] CALCIUM-MAGNESIUM-ZINC PO Take by mouth.  . famotidine (PEPCID AC) 10 MG chewable tablet Chew 10 mg by mouth daily.    . ferrous sulfate 325 (65 FE) MG EC tablet Take 325 mg by mouth 3 (three) times daily with meals.  [name]  glucosamine-chondroitin 500-400 MG tablet Take 1 tablet by mouth 3 (three) times daily.  . hydrochlorothiazide (HYDRODIURIL) 12.5 MG tablet Take 1 tablet (12.5 mg total) by mouth daily.  [name] lisinopril (PRINIVIL,ZESTRIL) 10 MG tablet Take 0.5 tablets (5 mg total) by mouth daily.  . Multiple Vitamin (MULTIVITAMIN) tablet Take 1 tablet by mouth daily.    . Omega 3-6-9 Fatty Acids (OMEGA 3-6-9 COMPLEX PO) Take by mouth.  . Omega-3 Fatty Acids (FISH OIL) 1200 MG CAPS Take 1 capsule by mouth 2 (two) times daily.  . Probiotic Product (PROBIOTIC ADVANCED PO) Take by mouth.  . ST JOHNS WORT PO Take by mouth.  . fenofibrate 160 MG tablet TAKE 1 TABLET DAILY (Patient not taking: Reported on 08/08/2019)  . polyethylene glycol powder (GLYCOLAX/MIRALAX) powder Take 17 g by mouth daily. (Patient not taking: Reported on 08/08/2019)  . [DISCONTINUED] hydrochlorothiazide (HYDRODIURIL) 12.5 MG tablet TAKE 2 TABLETS (25 MG TOTAL) BY MOUTH DAILY.   No facility-administered encounter medications on file as of 08/08/2019.     Activities of Daily Living In your present state of health, do you have any difficulty performing the following activities: 08/08/2019  Hearing? Y  Comment pt states has not been able to hear out of left ear since sx.  Vision? N  Difficulty concentrating or making decisions? N  Walking or climbing stairs? N  Dressing or bathing? N  Doing errands, shopping? N  Preparing Food and eating ? N  Using the Toilet? N  In the past six months, have you accidently leaked urine? N  Do you have problems with loss of bowel control? N  Managing your Medications? N  Managing your Finances? N  Housekeeping or managing your Housekeeping? N  Some recent data might be hidden    Patient Care Team: Carollee Herter, Alferd Apa, DO as PCP - General Poudyal, Ritesh, OD (Optometry) Sanjuana Kava, MD as Referring Physician (Otolaryngology) [facility] as Consulting Physician     Assessment:   This is a routine wellness examination for Kristin Delgado. Physical assessment deferred to PCP.  Exercise Activities and Dietary recommendations Current Exercise Habits: Home exercise routine, Type of exercise: walking, Time (Minutes): 30, Frequency (Times/Week): 2, Weekly Exercise (Minutes/Week): 60, Exercise limited by: None identified   Diet (meal preparation, eat out, water intake, caffeinated beverages, dairy products, fruits and vegetables): 24 hr  recall Breakfast: sliced [place] and spinach wrap Lunch: 8oz of meat and 2 cups of veg Dinner:    Cream of mushroom soup Pt reports she drinks plenty of water.   Goals    . Weight (lb) < 175 lb (79.4 kg) (pt-stated)     Exercise and drink more water.       Fall Risk Fall Risk  08/08/2019 01/24/2018  Falls in the past year? 1 No  Number falls in past yr: 1 -  Injury with Fall? 0 -  Follow up Education provided;Falls prevention discussed -    Depression Screen PHQ 2/9 Scores 08/08/2019 10/12/2018 01/24/2018 07/14/2013  PHQ - 2 Score 0 2 0 0  PHQ- 9 Score - 8 - -  Exception Documentation - Medical reason - -     Cognitive Function Ad8 score reviewed for issues:  Issues making decisions:no  Less interest in hobbies / activities:no  Repeats questions, stories (family complaining):no  Trouble using ordinary gadgets (microwave, computer, phone):no  Forgets the month or year: no  Mismanaging finances: no  Remembering appts:no  Daily problems with thinking and/or memory:no Ad8 score is=0     MMSE - Mini Mental State Exam 01/24/2018  Orientation to time 5  Orientation to Place 5  Registration 3  Attention/ Calculation 5  Recall 3  Language- name 2 objects 2  Language- repeat 1  Language- follow 3 step command 3  Language- read & follow direction 1  Write a sentence 1  Copy design 1  Total score 30        Immunization History  Administered Date(s) Administered  . Tdap 06/10/2011  . Zoster 07/25/2015     Screening Tests Health Maintenance  Topic Date Due  . MAMMOGRAM  08/23/2015  . PAP SMEAR-Modifier  01/27/2019  . INFLUENZA VACCINE  02/14/2020 (Originally 06/17/2019)  . HIV Screening  08/01/2024 (Originally 10/14/1971)  . TETANUS/TDAP  06/09/2021  . COLONOSCOPY  08/11/2021  . Hepatitis C Screening  Completed  :      Plan:   See you next year!  Continue to eat heart healthy diet (full of fruits, vegetables, whole grains, lean protein, water--limit salt, fat, and sugar intake) and increase physical activity as tolerated.  Continue doing brain stimulating activities (puzzles, reading, adult coloring books, staying active) to keep memory sharp.   I have ordered your mammogram . Please schedule.   I have personally reviewed and noted the following in the patient's chart:   . Medical and social history . Use of alcohol, tobacco or illicit drugs  . Current medications and supplements . Functional ability and status . Nutritional status . Physical activity . Advanced directives . List of other physicians . Hospitalizations, surgeries, and ER visits in previous 12 months . Vitals . Screenings to include cognitive, depression, and falls . Referrals and appointments  In addition, I have reviewed and discussed with patient certain preventive protocols, quality metrics, and best practice recommendations. A written personalized care plan for preventive services as well as general preventive health recommendations were provided to patient.     Shela Nevin, [place]  08/08/2019  Reviewed [name], DO

## 2019-08-08 ENCOUNTER — Encounter: Payer: Self-pay | Admitting: Family Medicine

## 2019-08-08 ENCOUNTER — Encounter: Payer: Self-pay | Admitting: *Deleted

## 2019-08-08 ENCOUNTER — Ambulatory Visit (INDEPENDENT_AMBULATORY_CARE_PROVIDER_SITE_OTHER): Payer: Medicare Other | Admitting: *Deleted

## 2019-08-08 ENCOUNTER — Other Ambulatory Visit: Payer: Self-pay

## 2019-08-08 ENCOUNTER — Ambulatory Visit (INDEPENDENT_AMBULATORY_CARE_PROVIDER_SITE_OTHER): Payer: Medicare Other | Admitting: Family Medicine

## 2019-08-08 VITALS — BP 124/70 | HR 60 | Ht 63.0 in | Wt 172.0 lb

## 2019-08-08 DIAGNOSIS — I10 Essential (primary) hypertension: Secondary | ICD-10-CM | POA: Diagnosis not present

## 2019-08-08 DIAGNOSIS — D352 Benign neoplasm of pituitary gland: Secondary | ICD-10-CM

## 2019-08-08 DIAGNOSIS — E785 Hyperlipidemia, unspecified: Secondary | ICD-10-CM

## 2019-08-08 DIAGNOSIS — Z1231 Encounter for screening mammogram for malignant neoplasm of breast: Secondary | ICD-10-CM | POA: Diagnosis not present

## 2019-08-08 DIAGNOSIS — Z0000 Encounter for general adult medical examination without abnormal findings: Secondary | ICD-10-CM | POA: Diagnosis not present

## 2019-08-08 MED ORDER — BROMOCRIPTINE MESYLATE 2.5 MG PO TABS: 2.5000 mg | ORAL_TABLET | Freq: Every day | ORAL | 3 refills | Status: DC

## 2019-08-08 MED ORDER — ATORVASTATIN CALCIUM 20 MG PO TABS: 20.0000 mg | ORAL_TABLET | Freq: Every day | ORAL | 1 refills | Status: DC

## 2019-08-08 NOTE — Patient Instructions (Signed)

## 2019-08-08 NOTE — Patient Instructions (Signed)
See you next year!  Continue to eat heart healthy diet (full of fruits, vegetables, whole grains, lean protein, water--limit salt, fat, and sugar intake) and increase physical activity as tolerated.  Continue doing brain stimulating activities (puzzles, reading, adult coloring books, staying active) to keep memory sharp.   I have ordered your mammogram . Please schedule.    Kristin Delgado , Thank you for taking time to come for your Medicare Wellness Visit. I appreciate your ongoing commitment to your health goals. Please review the following plan we discussed and let me know if I can assist you in the future.   These are the goals we discussed: Goals    . Weight (lb) < 160 lb (72.6 kg)    . Weight (lb) < 175 lb (79.4 kg) (pt-stated)     Exercise and drink more water.       This is a list of the screening recommended for you and due dates:  Health Maintenance  Topic Date Due  . Mammogram  08/23/2015  . Pap Smear  01/27/2019  . Flu Shot  02/14/2020*  . HIV Screening  08/01/2024*  . Tetanus Vaccine  06/09/2021  . Colon Cancer Screening  08/11/2021  .  Hepatitis C: One time screening is recommended by Center for Disease Control  (CDC) for  adults born from [birth year] through 1965.   Completed  *Topic was postponed. The date shown is not the original due date.    Health Maintenance After Age 51 After age 59, you are at a higher risk for certain long-term diseases and infections as well as injuries from falls. Falls are a major cause of broken bones and head injuries in people who are older than age 29. Getting regular preventive care can help to keep you healthy and well. Preventive care includes getting regular testing and making lifestyle changes as recommended by your health care provider. Talk with your health care provider about:  Which screenings and tests you should have. A screening is a test that checks for a disease when you have no symptoms.  A diet and exercise plan that is right  for you. What should I know about screenings and tests to prevent falls? Screening and testing are the best ways to find a health problem early. Early diagnosis and treatment give you the best chance of managing medical conditions that are common after age 40. Certain conditions and lifestyle choices may make you more likely to have a fall. Your health care provider may recommend:  Regular vision checks. Poor vision and conditions such as cataracts can make you more likely to have a fall. If you wear glasses, make sure to get your prescription updated if your vision changes.  Medicine review. Work with your health care provider to regularly review all of the medicines you are taking, including over-the-counter medicines. Ask your health care provider about any side effects that may make you more likely to have a fall. Tell your health care provider if any medicines that you take make you feel dizzy or sleepy.  Osteoporosis screening. Osteoporosis is a condition that causes the bones to get weaker. This can make the bones weak and cause them to break more easily.  Blood pressure screening. Blood pressure changes and medicines to control blood pressure can make you feel dizzy.  Strength and balance checks. Your health care provider may recommend certain tests to check your strength and balance while standing, walking, or changing positions.  Foot health exam. Foot pain and  numbness, as well as not wearing proper footwear, can make you more likely to have a fall.  Depression screening. You may be more likely to have a fall if you have a fear of falling, feel emotionally low, or feel unable to do activities that you used to do.  Alcohol use screening. Using too much alcohol can affect your balance and may make you more likely to have a fall. What actions can I take to lower my risk of falls? General instructions  Talk with your health care provider about your risks for falling. Tell your health  care provider if: ? You fall. Be sure to tell your health care provider about all falls, even ones that seem minor. ? You feel dizzy, sleepy, or off-balance.  Take over-the-counter and prescription medicines only as told by your health care provider. These include any supplements.  Eat a healthy diet and maintain a healthy weight. A healthy diet includes low-fat dairy products, low-fat (lean) meats, and fiber from whole grains, beans, and lots of fruits and vegetables. Home safety  Remove any tripping hazards, such as rugs, cords, and clutter.  Install safety equipment such as grab bars in bathrooms and safety rails on stairs.  Keep rooms and walkways well-lit. Activity   Follow a regular exercise program to stay fit. This will help you maintain your balance. Ask your health care provider what types of exercise are appropriate for you.  If you need a cane or walker, use it as recommended by your health care provider.  Wear supportive shoes that have nonskid soles. Lifestyle  Do not drink alcohol if your health care provider tells you not to drink.  If you drink alcohol, limit how much you have: ? 0-1 drink a day for women. ? 0-2 drinks a day for men.  Be aware of how much alcohol is in your drink. In the [place] one drink equals one typical bottle of beer (12 oz), one-half glass of wine (5 oz), or one shot of hard liquor (1 oz).  Do not use any products that contain nicotine or tobacco, such as cigarettes and e-cigarettes. If you need help quitting, ask your health care provider. Summary  Having a healthy lifestyle and getting preventive care can help to protect your health and wellness after age 34.  Screening and testing are the best way to find a health problem early and help you avoid having a fall. Early diagnosis and treatment give you the best chance for managing medical conditions that are more common for people who are older than age 63.  Falls are a major cause of  broken bones and head injuries in people who are older than age 62. Take precautions to prevent a fall at home.  Work with your health care provider to learn what changes you can make to improve your health and wellness and to prevent falls. This information is not intended to replace advice given to you by your health care provider. Make sure you discuss any questions you have with your health care provider. Document Released: 09/15/2017 Document Revised: 02/23/2019 Document Reviewed: 09/15/2017 Elsevier Patient Education  2020 [employer].

## 2019-08-08 NOTE — Assessment & Plan Note (Signed)
Well controlled, no changes to meds. Encouraged heart healthy diet such as the DASH diet and exercise as tolerated.  °

## 2019-08-08 NOTE — Assessment & Plan Note (Signed)
Encouraged heart healthy diet, increase exercise, avoid trans fats, consider a krill oil cap daily 

## 2019-08-08 NOTE — Assessment & Plan Note (Signed)
Stable On parlodel

## 2019-08-08 NOTE — Progress Notes (Signed)
Patient ID: Kristin Delgado, female    DOB: 03/21/1956  Age: 63 y.o. MRN: [id number]    Subjective:  Subjective  HPI Kristin Delgado presents for f/u b/p, chol and pituitary adenoma.  Pt has lost 30 lbs with healthy weight and wellness.  Pt has no complaints.   Review of Systems  Constitutional: Negative for chills and fever.  HENT: Negative for congestion and hearing loss.   Eyes: Negative for discharge.  Respiratory: Negative for cough and shortness of breath.   Cardiovascular: Negative for chest pain, palpitations and leg swelling.  Gastrointestinal: Negative for abdominal pain, blood in stool, constipation, diarrhea, nausea and vomiting.  Genitourinary: Negative for dysuria, frequency, hematuria and urgency.  Musculoskeletal: Negative for back pain and myalgias.  Skin: Negative for rash.  Allergic/Immunologic: Negative for environmental allergies.  Neurological: Negative for dizziness, weakness and headaches.  Hematological: Does not bruise/bleed easily.  Psychiatric/Behavioral: Negative for suicidal ideas. The patient is not nervous/anxious.     History Past Medical History:  Diagnosis Date  . Allergy    seasonal  . Anxiety    closed space like MRI  . Back pain \  . Chest pain 2006   admitted, essentially negative  . Constipation   . Depression   . GERD (gastroesophageal reflux disease)   . Hyperlipidemia   . Hypertension   . Infertility, female   . Joint pain   . Pituitary microadenoma ([place])   . Swallowing difficulty   . 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   Her family history includes Alzheimer's disease in her father; Cancer (age of onset: 9) in her mother; Dementia in her father; Lung cancer in her mother.She reports that she has never smoked. She has never used smokeless tobacco. She reports that she does not  drink alcohol or use drugs.  Current Outpatient Medications on File Prior to Visit  Medication Sig Dispense Refill  . acetaminophen (TYLENOL ARTHRITIS PAIN) 650 MG CR tablet Take 650 mg by mouth every 8 (eight) hours as needed. pain     . Ascorbic Acid (VITAMIN C) 1000 MG tablet Take 1,000 mg by mouth 2 (two) times daily.      [name] b complex vitamins tablet Take 1 tablet by mouth daily.    . Calcium Polycarbophil (FIBER-CAPS PO) Take by mouth.    [name] CALCIUM-MAGNESIUM-ZINC PO Take by mouth.    . famotidine (PEPCID AC) 10 MG chewable tablet Chew 10 mg by mouth daily.      . fenofibrate 160 MG tablet TAKE 1 TABLET DAILY (Patient not taking: Reported on 08/08/2019) 90 tablet 1  . ferrous sulfate 325 (65 FE) MG EC tablet Take 325 mg by mouth 3 (three) times daily with meals.    [name] glucosamine-chondroitin 500-400 MG tablet Take 1 tablet by mouth 3 (three) times daily.    . hydrochlorothiazide (HYDRODIURIL) 12.5 MG tablet Take 1 tablet (12.5 mg total) by mouth daily. 30 tablet 0  . lisinopril (PRINIVIL,ZESTRIL) 10 MG tablet Take 0.5 tablets (5 mg total) by mouth daily. 45 tablet 1  . Multiple Vitamin (MULTIVITAMIN) tablet Take 1 tablet by mouth daily.      . Omega 3-6-9 Fatty Acids (OMEGA 3-6-9 COMPLEX PO) Take by mouth.    . Omega-3 Fatty Acids (FISH OIL) 1200 MG CAPS Take 1 capsule by mouth 2 (two) times daily.    [name]  polyethylene glycol powder (GLYCOLAX/MIRALAX) powder Take 17 g by mouth daily. (Patient not taking: Reported on 08/08/2019) 3350 g 0  . Probiotic Product (PROBIOTIC ADVANCED PO) Take by mouth.    . ST JOHNS WORT PO Take by mouth.     No current facility-administered medications on file prior to visit.      Objective:  Objective  Physical Exam Vitals signs and nursing note reviewed.  Constitutional:      Appearance: She is well-developed.  HENT:     Head: Normocephalic and atraumatic.  Eyes:     Conjunctiva/sclera: Conjunctivae normal.  Neck:     Musculoskeletal: Normal range of  motion and neck supple.     Thyroid: No thyromegaly.     Vascular: No carotid bruit or JVD.  Cardiovascular:     Rate and Rhythm: Normal rate and regular rhythm.     Heart sounds: Normal heart sounds. No murmur.  Pulmonary:     Effort: Pulmonary effort is normal. No respiratory distress.     Breath sounds: Normal breath sounds. No wheezing or rales.  Chest:     Chest wall: No tenderness.  Neurological:     Mental Status: She is alert and oriented to person, place, and time.    BP 124/70 (BP Location: Left Arm, Patient Position: Sitting, Cuff Size: Normal)   Pulse 60   Resp 18   Ht 5\' 3"  (1.6 m)   Wt 172 lb (78 kg)   SpO2 98%   BMI 30.47 kg/m  Wt Readings from Last 3 Encounters:  08/08/19 172 lb (78 kg)  08/08/19 172 lb (78 kg)  08/07/19 169 lb (76.7 kg)     Lab Results  Component Value Date   WBC 6.5 01/23/2019   HGB 11.5 01/23/2019   HCT 38.0 01/23/2019   PLT 243 10/12/2018   GLUCOSE 91 06/22/2019   CHOL 170 06/22/2019   TRIG 103 06/22/2019   HDL 46 06/22/2019   LDLDIRECT 83.0 01/28/2017   LDLCALC 103 (H) 06/22/2019   ALT 60 (H) 06/22/2019   AST 36 06/22/2019   NA 142 06/22/2019   K 3.9 06/22/2019   CL 100 06/22/2019   CREATININE 0.74 06/22/2019   BUN 25 06/22/2019   CO2 24 06/22/2019   TSH 1.830 10/12/2018   HGBA1C 5.2 06/22/2019    Dg Lumbar Spine Complete  Result Date: 01/30/2019 CLINICAL DATA:  LEFT side lower back and buttock pain since falling a few days ago EXAM: LUMBAR SPINE - COMPLETE 4+ VIEW COMPARISON:  07/03/2010 FINDINGS: 5 non-rib-bearing lumbar vertebra. Mild levoconvex lumbar scoliosis. Multilevel disc space narrowing and scattered endplate spur formation. Vertebral body heights grossly maintained. Subtle superior endplate compression deformity of the L2 vertebral body is identified though vertebral body height is fairly well maintained. No additional fracture, subluxation or bone destruction. Facet degenerative changes lower lumbar spine. SI  joints preserved. IMPRESSION: Subtle superior endplate compression fracture of L2 vertebral body though vertebral body height is well maintained. Osseous demineralization with scattered degenerative disc and facet disease changes of the thoracolumbar spine. Electronically Signed   By: Lavonia Dana M.D.   On: 01/30/2019 15:14   Dg Sacrum/coccyx  Result Date: 01/30/2019 CLINICAL DATA:  LEFT side low back and buttock pain since falling a few days ago EXAM: SACRUM AND COCCYX - 2+ VIEW COMPARISON:  None FINDINGS: Degenerative disc disease changes and minimal levoconvex scoliosis at visualized lower lumbar spine. Bones appear demineralized. Hip and SI joint spaces preserved. No acute fracture, dislocation, or  bone destruction. IMPRESSION: No acute osseous abnormalities. Degenerative disc disease changes and levoconvex scoliosis of the lower lumbar spine. Electronically Signed   By: Lavonia Dana M.D.   On: 01/30/2019 15:14     Assessment & Plan:  Plan  I am having Veverly Fells maintain her famotidine, multivitamin, vitamin C, acetaminophen, Fish Oil, CALCIUM-MAGNESIUM-ZINC PO, Omega 3-6-9 Fatty Acids (OMEGA 3-6-9 COMPLEX PO), Probiotic Product (PROBIOTIC ADVANCED PO), ST JOHNS WORT PO, Calcium Polycarbophil (FIBER-CAPS PO), fenofibrate, ferrous sulfate, b complex vitamins, glucosamine-chondroitin, polyethylene glycol powder, lisinopril, hydrochlorothiazide, atorvastatin, and bromocriptine.  Meds ordered this encounter  Medications  . atorvastatin (LIPITOR) 20 MG tablet    Sig: Take 1 tablet (20 mg total) by mouth daily.    Dispense:  90 tablet    Refill:  1  . bromocriptine (PARLODEL) 2.5 MG tablet    Sig: Take 1 tablet (2.5 mg total) by mouth daily.    Dispense:  90 tablet    Refill:  3    Problem List Items Addressed This Visit      Unprioritized   Essential hypertension    Well controlled, no changes to meds. Encouraged heart healthy diet such as the DASH diet and exercise as tolerated.        Relevant Medications   atorvastatin (LIPITOR) 20 MG tablet   Hyperlipidemia    Encouraged heart healthy diet, increase exercise, avoid trans fats, consider a krill oil cap daily       Relevant Medications   atorvastatin (LIPITOR) 20 MG tablet   Pituitary adenoma ([facility])    Stable On parlodel       Other Visit Diagnoses    Pituitary microadenoma ([place])       Relevant Medications   bromocriptine (PARLODEL) 2.5 MG tablet      Follow-up: Return in about 6 months (around 02/05/2020), or if symptoms worsen or fail to improve, for hypertension, hyperlipidemia, diabetes II.  [name], DO

## 2019-08-09 NOTE — Progress Notes (Signed)
Office: [phone]  /  Fax: [phone]   HPI:   Chief Complaint: OBESITY Kristin Delgado is here to discuss her progress with her obesity treatment plan. She is on the keep a food journal with 1150-1300 calories and 80+ grams of protein daily and is following her eating plan approximately 50 % of the time. She states she is walking 5,000 steps 5 times per week. Kristin Delgado is very emotional during the visit. She voices a lot of familial turmoil, and she has skipped quite a few meals because of feeling stressed. She hasn't really journaled much.  Her weight is 169 lb (76.7 kg) today and has gained 5 lbs since her last visit. She has lost 20 lbs since starting treatment with [name].  Hypertension Kristin Delgado is a 63 y.o. female with hypertension. Kristin Delgado's blood pressure is controlled today. She denies chest pain, chest pressure, or headaches. She is working on weight loss to help control her blood pressure with the goal of decreasing her risk of heart attack and stroke.   Hyperlipidemia Kristin Delgado has hyperlipidemia and has been trying to improve her cholesterol levels with intensive lifestyle modification including a low saturated fat diet, exercise and weight loss. Last LDL was of 103 and she is on statin. She denies any chest pain, claudication or myalgias.  ASSESSMENT AND PLAN:  Essential hypertension - Plan: hydrochlorothiazide (HYDRODIURIL) 12.5 MG tablet  Other hyperlipidemia  Class 1 obesity with serious comorbidity and body mass index (BMI) of 30.0 to 30.9 in adult, unspecified obesity type  PLAN:  Hypertension We discussed sodium restriction, working on healthy weight loss, and a regular exercise program as the means to achieve improved blood pressure control. Kristin Delgado agreed with this plan and agreed to follow up as directed. We will continue to monitor her blood pressure as well as her progress with the above lifestyle modifications. Kristin Delgado agrees to continue taking hydrochlorothiazide  12.5 mg PO daily #30 and we will refill for 1 month. She will watch for signs of hypotension as she continues her lifestyle modifications. Zunaira agrees to follow up with our clinic in 2 weeks.  Hyperlipidemia Kristin Delgado was informed of the American Heart Association Guidelines emphasizing intensive lifestyle modifications as the first line treatment for hyperlipidemia. We discussed many lifestyle modifications today in depth, and Kristin Delgado will continue to work on decreasing saturated fats such as fatty red meat, butter and many fried foods. She will also increase vegetables and lean protein in her diet and continue to work on exercise and weight loss efforts. Kristin Delgado agrees to continue taking Lipitor, and she agrees to follow up with our clinic in 2 weeks.  Obesity Kristin Delgado is currently in the action stage of change. As such, her goal is to continue with weight loss efforts She has agreed to keep a food journal with 1000-1100 calories and 75+ grams of protein daily Kristin Delgado has been instructed to work up to a goal of 150 minutes of combined cardio and strengthening exercise per week for weight loss and overall health benefits. We discussed the following Behavioral Modification Strategies today: increasing lean protein intake, increasing vegetables and work on meal planning and easy cooking plans, no skipping meals, keeping healthy foods in the home, better snacking choices, and planning for success   Kristin Delgado has agreed to follow up with our clinic in 2 weeks. She was informed of the importance of frequent follow up visits to maximize her success with intensive lifestyle modifications for her multiple health conditions.  ALLERGIES: No Known Allergies  MEDICATIONS: Current Outpatient Medications on File Prior to Visit  Medication Sig Dispense Refill  . acetaminophen (TYLENOL ARTHRITIS PAIN) 650 MG CR tablet Take 650 mg by mouth every 8 (eight) hours as needed. pain     . Ascorbic Acid (VITAMIN C)  1000 MG tablet Take 1,000 mg by mouth 2 (two) times daily.      [name] b complex vitamins tablet Take 1 tablet by mouth daily.    . Calcium Polycarbophil (FIBER-CAPS PO) Take by mouth.    [name] CALCIUM-MAGNESIUM-ZINC PO Take by mouth.    . famotidine (PEPCID AC) 10 MG chewable tablet Chew 10 mg by mouth daily.      . fenofibrate 160 MG tablet TAKE 1 TABLET DAILY (Patient not taking: Reported on 08/08/2019) 90 tablet 1  . ferrous sulfate 325 (65 FE) MG EC tablet Take 325 mg by mouth 3 (three) times daily with meals.    [name] glucosamine-chondroitin 500-400 MG tablet Take 1 tablet by mouth 3 (three) times daily.    [name] lisinopril (PRINIVIL,ZESTRIL) 10 MG tablet Take 0.5 tablets (5 mg total) by mouth daily. 45 tablet 1  . Multiple Vitamin (MULTIVITAMIN) tablet Take 1 tablet by mouth daily.      . Omega 3-6-9 Fatty Acids (OMEGA 3-6-9 COMPLEX PO) Take by mouth.    . Omega-3 Fatty Acids (FISH OIL) 1200 MG CAPS Take 1 capsule by mouth 2 (two) times daily.    . polyethylene glycol powder (GLYCOLAX/MIRALAX) powder Take 17 g by mouth daily. (Patient not taking: Reported on 08/08/2019) 3350 g 0  . Probiotic Product (PROBIOTIC ADVANCED PO) Take by mouth.    . ST JOHNS WORT PO Take by mouth.     No current facility-administered medications on file prior to visit.     PAST MEDICAL HISTORY: Past Medical History:  Diagnosis Date  . Allergy    seasonal  . Anxiety    closed space like MRI  . Back pain \  . Chest pain 2006   admitted, essentially negative  . Constipation   . Depression   . GERD (gastroesophageal reflux disease)   . Hyperlipidemia   . Hypertension   . Infertility, female   . Joint pain   . Pituitary microadenoma ([place])   . Swallowing difficulty   . 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 Vitamin D deficiency     PAST SURGICAL HISTORY: Past Surgical History:  Procedure Laterality Date  . 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 BRAIN SURGERY  1 / 1985   microadenoma  . COCHLEAR IMPLANT  02/2013    Left side    SOCIAL HISTORY: Social History   Tobacco Use  . Smoking status: Never Smoker  . Smokeless tobacco: Never Used  Substance Use Topics  . Alcohol use: No    Comment: rare  . Drug use: No    FAMILY HISTORY: Family History  Problem Relation Age of Onset  . Lung cancer Mother   . Cancer Mother 60       lung   . Alzheimer's disease Father   . Dementia Father     ROS: Review of Systems  Constitutional: Negative for weight loss.  Cardiovascular: Negative for chest pain and claudication.       Negative chest pressure  Musculoskeletal: Negative for myalgias.  Neurological: Negative for headaches.    PHYSICAL EXAM: Blood pressure 125/80, pulse 63, temperature 98.2 F (36.8 C), temperature source Oral, height 5\' 3"  (1.6 m), weight 169 lb (76.7 kg), SpO2  99 %. Body mass index is 29.94 kg/m. Physical Exam Vitals signs reviewed.  Constitutional:      Appearance: Normal appearance. She is obese.  Cardiovascular:     Rate and Rhythm: Normal rate.     Pulses: Normal pulses.  Pulmonary:     Effort: Pulmonary effort is normal.     Breath sounds: Normal breath sounds.  Musculoskeletal: Normal range of motion.  Skin:    General: Skin is warm and dry.  Neurological:     Mental Status: She is alert and oriented to person, place, and time.  Psychiatric:        Mood and Affect: Mood normal.        Behavior: Behavior normal.     RECENT LABS AND TESTS: BMET    Component Value Date/Time   NA 142 06/22/2019 0907   K 3.9 06/22/2019 0907   CL 100 06/22/2019 0907   CO2 24 06/22/2019 0907   GLUCOSE 91 06/22/2019 0907   GLUCOSE 110 (H) 08/01/2018 0932   BUN 25 06/22/2019 0907   CREATININE 0.74 06/22/2019 0907   CALCIUM 10.0 06/22/2019 0907   GFRNONAA 86 06/22/2019 0907   GFRAA 99 06/22/2019 0907   Lab Results  Component Value Date   HGBA1C 5.2 06/22/2019   HGBA1C 5.3 01/23/2019   HGBA1C 5.6 10/12/2018   HGBA1C 6.1 01/28/2017   Lab Results  Component Value  Date   INSULIN 6.7 06/22/2019   INSULIN 6.2 01/23/2019   INSULIN 5.9 10/12/2018   CBC    Component Value Date/Time   WBC 6.5 01/23/2019 1518   WBC 6.4 02/08/2018 1125   RBC 4.61 01/23/2019 1518   RBC 4.21 02/08/2018 1125   HGB 11.5 01/23/2019 1518   HCT 38.0 01/23/2019 1518   PLT 243 10/12/2018 1325   MCV 82 01/23/2019 1518   MCH 24.9 (L) 01/23/2019 1518   MCH 26.4 (L) 02/08/2018 1125   MCHC 30.3 (L) 01/23/2019 1518   MCHC 33.2 02/08/2018 1125   RDW 14.4 01/23/2019 1518   LYMPHSABS 2.1 01/23/2019 1518   MONOABS 0.4 07/26/2017 1028   EOSABS 0.1 01/23/2019 1518   BASOSABS 0.0 01/23/2019 1518   Iron/TIBC/Ferritin/ %Sat    Component Value Date/Time   IRON 52 12/12/2007 1500   IRONPCTSAT 14.6 (L) 12/12/2007 1500   Lipid Panel     Component Value Date/Time   CHOL 170 06/22/2019 0907   TRIG 103 06/22/2019 0907   HDL 46 06/22/2019 0907   CHOLHDL 6 01/30/2019 0932   VLDL 36.0 01/30/2019 0932   LDLCALC 103 (H) 06/22/2019 0907   LDLDIRECT 83.0 01/28/2017 1011   Hepatic Function Panel     Component Value Date/Time   PROT 6.6 06/22/2019 0907   ALBUMIN 4.5 06/22/2019 0907   AST 36 06/22/2019 0907   ALT 60 (H) 06/22/2019 0907   ALKPHOS 58 06/22/2019 0907   BILITOT 0.3 06/22/2019 0907   BILIDIR 0.0 05/02/2015 0823      Component Value Date/Time   TSH 1.830 10/12/2018 1325   TSH 2.58 04/06/2016 1408   TSH 2.86 07/16/2014 1051      OBESITY BEHAVIORAL INTERVENTION VISIT  Today's visit was # 18   Starting weight: 189 lbs Starting date: 10/12/18 Today's weight :169 lbs Today's date: 08/07/2019 Total lbs lost to date: 20 At least 15 minutes were spent on discussing the following behavioral intervention visit.   ASK: We discussed the diagnosis of obesity with Veverly Fells today and Montrell agreed to give [name] permission to  discuss obesity behavioral modification therapy today.  ASSESS: Leketa has the diagnosis of obesity and her BMI today is 29.94 Yuxi is in  the action stage of change   ADVISE: Haby was educated on the multiple health risks of obesity as well as the benefit of weight loss to improve her health. She was advised of the need for long term treatment and the importance of lifestyle modifications to improve her current health and to decrease her risk of future health problems.  AGREE: Multiple dietary modification options and treatment options were discussed and  Temprence agreed to follow the recommendations documented in the above note.  ARRANGE: Raechal was educated on the importance of frequent visits to treat obesity as outlined per CMS and USPSTF guidelines and agreed to schedule her next follow up appointment today.  I, Trixie Dredge, am acting as transcriptionist for Ilene Qua, MD  I have reviewed the above documentation for accuracy and completeness, and I agree with the above. - Ilene Qua, MD

## 2019-08-20 ENCOUNTER — Other Ambulatory Visit: Payer: Self-pay | Admitting: Family Medicine

## 2019-08-20 DIAGNOSIS — I10 Essential (primary) hypertension: Secondary | ICD-10-CM

## 2019-08-21 ENCOUNTER — Ambulatory Visit (INDEPENDENT_AMBULATORY_CARE_PROVIDER_SITE_OTHER): Payer: Medicare Other | Admitting: Family Medicine

## 2019-08-21 ENCOUNTER — Other Ambulatory Visit: Payer: Self-pay

## 2019-08-21 ENCOUNTER — Encounter (INDEPENDENT_AMBULATORY_CARE_PROVIDER_SITE_OTHER): Payer: Self-pay | Admitting: Family Medicine

## 2019-08-21 VITALS — BP 125/75 | HR 60 | Temp 98.0°F | Ht 63.0 in | Wt 164.0 lb

## 2019-08-21 DIAGNOSIS — E669 Obesity, unspecified: Secondary | ICD-10-CM

## 2019-08-21 DIAGNOSIS — Z6830 Body mass index (BMI) 30.0-30.9, adult: Secondary | ICD-10-CM

## 2019-08-21 DIAGNOSIS — E7849 Other hyperlipidemia: Secondary | ICD-10-CM

## 2019-08-21 DIAGNOSIS — Z9189 Other specified personal risk factors, not elsewhere classified: Secondary | ICD-10-CM | POA: Diagnosis not present

## 2019-08-21 DIAGNOSIS — I10 Essential (primary) hypertension: Secondary | ICD-10-CM | POA: Diagnosis not present

## 2019-08-21 MED ORDER — LISINOPRIL 10 MG PO TABS: 5.0000 mg | ORAL_TABLET | Freq: Every day | ORAL | 0 refills | Status: DC

## 2019-08-22 NOTE — Progress Notes (Signed)
Office: [phone]  /  Fax: [phone]   HPI:   Chief Complaint: OBESITY Kristin Delgado is here to discuss her progress with her obesity treatment plan. She is on the keep a food journal with 1000-1100 calories and 75+ grams of protein daily and is following her eating plan approximately 95 % of the time. She states she is walking at work 5,000 steps 5 times per week, and walking the dog 1 mile 2 times per week. Kristin Delgado voices the last few weeks have been better, less familiar stress. She denies hunger. She is concentrating on getting all of the food in, and is feeling stuffed everyday. She has added protein smoothie at the end of the day instead of dinner occasionally.  Her weight is 164 lb (74.4 kg) today and has had a weight loss of 5 pounds over a period of 2 weeks since her last visit. She has lost 25 lbs since starting treatment with [name].  Hypertension Kristin Delgado is a 63 y.o. female with hypertension. Kristin Delgado's blood pressure is controlled today. She denies chest pain, chest pressure, or headaches. She is working on weight loss to help control her blood pressure with the goal of decreasing her risk of heart attack and stroke.   Hyperlipidemia Kristin Delgado has hyperlipidemia and has been trying to improve her cholesterol levels with intensive lifestyle modification including a low saturated fat diet, exercise and weight loss. Last LDL was of 103 and HDL of 46. She is on statin and denies any chest pain, claudication or myalgias.  At risk for cardiovascular disease Kristin Delgado is at a higher than average risk for cardiovascular disease due to obesity, hypertension, and hyperlipidemia. She currently denies any chest pain.  ASSESSMENT AND PLAN:  Essential hypertension - Plan: lisinopril (ZESTRIL) 10 MG tablet  Other hyperlipidemia  At risk for heart disease  Class 1 obesity with serious comorbidity and body mass index (BMI) of 30.0 to 30.9 in adult, unspecified obesity type  PLAN:   Hypertension We discussed sodium restriction, working on healthy weight loss, and a regular exercise program as the means to achieve improved blood pressure control. Kristin Delgado agreed with this plan and agreed to follow up as directed. We will continue to monitor her blood pressure as well as her progress with the above lifestyle modifications. Kristin Delgado agrees to continue taking lisinopril 5 mg PO daily #30 and we will refill for 1 month. She will watch for signs of hypotension as she continues her lifestyle modifications. Kristin Delgado agrees to follow up with our clinic in 2 weeks.  Hyperlipidemia Kristin Delgado was informed of the American Heart Association Guidelines emphasizing intensive lifestyle modifications as the first line treatment for hyperlipidemia. We discussed many lifestyle modifications today in depth, and Kristin Delgado will continue to work on decreasing saturated fats such as fatty red meat, butter and many fried foods. She will also increase vegetables and lean protein in her diet and continue to work on exercise and weight loss efforts. Kristin Delgado agrees to continue taking Lipitor, no refill needed. Kristin Delgado agrees to follow up with our clinic in 2 weeks.  Cardiovascular risk counseling Kristin Delgado was given extended (15 minutes) coronary artery disease prevention counseling today. She is 63 y.o. female and has risk factors for heart disease including obesity, hypertension, and hyperlipidemia. We discussed intensive lifestyle modifications today with an emphasis on specific weight loss instructions and strategies. Pt was also informed of the importance of increasing exercise and decreasing saturated fats to help prevent heart disease.  Obesity Kristin Delgado is currently in  the action stage of change. As such, her goal is to continue with weight loss efforts She has agreed to keep a food journal with 1000-1100 calories and 75+ grams of protein daily Kristin Delgado has been instructed to work up to a goal of 150 minutes of  combined cardio and strengthening exercise per week for weight loss and overall health benefits. We discussed the following Behavioral Modification Strategies today: increasing lean protein intake, increasing vegetables and work on meal planning and easy cooking plans, keeping healthy foods in the home, better snacking choices, and planning for success   Kristin Delgado has agreed to follow up with our clinic in 2 weeks. She was informed of the importance of frequent follow up visits to maximize her success with intensive lifestyle modifications for her multiple health conditions.  ALLERGIES: No Known Allergies  MEDICATIONS: Current Outpatient Medications on File Prior to Visit  Medication Sig Dispense Refill  . acetaminophen (TYLENOL ARTHRITIS PAIN) 650 MG CR tablet Take 650 mg by mouth every 8 (eight) hours as needed. pain     . Ascorbic Acid (VITAMIN C) 1000 MG tablet Take 1,000 mg by mouth 2 (two) times daily.      [name] atorvastatin (LIPITOR) 20 MG tablet Take 1 tablet (20 mg total) by mouth daily. 90 tablet 1  . b complex vitamins tablet Take 1 tablet by mouth daily.    . bromocriptine (PARLODEL) 2.5 MG tablet Take 1 tablet (2.5 mg total) by mouth daily. 90 tablet 3  . Calcium Polycarbophil (FIBER-CAPS PO) Take by mouth.    [name] CALCIUM-MAGNESIUM-ZINC PO Take by mouth.    . famotidine (PEPCID AC) 10 MG chewable tablet Chew 10 mg by mouth daily.      . fenofibrate 160 MG tablet TAKE 1 TABLET DAILY 90 tablet 1  . ferrous sulfate 325 (65 FE) MG EC tablet Take 325 mg by mouth 3 (three) times daily with meals.    [name] glucosamine-chondroitin 500-400 MG tablet Take 1 tablet by mouth 3 (three) times daily.    . hydrochlorothiazide (HYDRODIURIL) 12.5 MG tablet Take 1 tablet (12.5 mg total) by mouth daily. 30 tablet 0  . Multiple Vitamin (MULTIVITAMIN) tablet Take 1 tablet by mouth daily.      . Omega 3-6-9 Fatty Acids (OMEGA 3-6-9 COMPLEX PO) Take by mouth.    . Omega-3 Fatty Acids (FISH OIL) 1200 MG CAPS Take  1 capsule by mouth 2 (two) times daily.    . polyethylene glycol powder (GLYCOLAX/MIRALAX) powder Take 17 g by mouth daily. 3350 g 0  . Probiotic Product (PROBIOTIC ADVANCED PO) Take by mouth.    . ST JOHNS WORT PO Take by mouth.     No current facility-administered medications on file prior to visit.     PAST MEDICAL HISTORY: Past Medical History:  Diagnosis Date  . Allergy    seasonal  . Anxiety    closed space like MRI  . Back pain \  . Chest pain 2006   admitted, essentially negative  . Constipation   . Depression   . GERD (gastroesophageal reflux disease)   . Hyperlipidemia   . Hypertension   . Infertility, female   . Joint pain   . Pituitary microadenoma ([place])   . Swallowing difficulty   . 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 Vitamin D deficiency     PAST SURGICAL HISTORY: Past Surgical History:  Procedure Laterality Date  . 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 BRAIN SURGERY  1 / 1985  microadenoma  . COCHLEAR IMPLANT  02/2013   Left side    SOCIAL HISTORY: Social History   Tobacco Use  . Smoking status: Never Smoker  . Smokeless tobacco: Never Used  Substance Use Topics  . Alcohol use: No    Comment: rare  . Drug use: No    FAMILY HISTORY: Family History  Problem Relation Age of Onset  . Lung cancer Mother   . Cancer Mother 23       lung   . Alzheimer's disease Father   . Dementia Father     ROS: Review of Systems  Constitutional: Positive for weight loss.  Cardiovascular: Negative for chest pain and claudication.       Negative chest pressure  Musculoskeletal: Negative for myalgias.  Neurological: Negative for headaches.    PHYSICAL EXAM: Blood pressure 125/75, pulse 60, temperature 98 F (36.7 C), temperature source Oral, height 5\' 3"  (1.6 m), weight 164 lb (74.4 kg), SpO2 98 %. Body mass index is 29.05 kg/m. Physical Exam Vitals signs reviewed.  Constitutional:      Appearance: Normal appearance. She is obese.  Cardiovascular:      Rate and Rhythm: Normal rate.     Pulses: Normal pulses.  Pulmonary:     Effort: Pulmonary effort is normal.     Breath sounds: Normal breath sounds.  Musculoskeletal: Normal range of motion.  Skin:    General: Skin is warm and dry.  Neurological:     Mental Status: She is alert and oriented to person, place, and time.  Psychiatric:        Mood and Affect: Mood normal.        Behavior: Behavior normal.     RECENT LABS AND TESTS: BMET    Component Value Date/Time   NA 142 06/22/2019 0907   K 3.9 06/22/2019 0907   CL 100 06/22/2019 0907   CO2 24 06/22/2019 0907   GLUCOSE 91 06/22/2019 0907   GLUCOSE 110 (H) 08/01/2018 0932   BUN 25 06/22/2019 0907   CREATININE 0.74 06/22/2019 0907   CALCIUM 10.0 06/22/2019 0907   GFRNONAA 86 06/22/2019 0907   GFRAA 99 06/22/2019 0907   Lab Results  Component Value Date   HGBA1C 5.2 06/22/2019   HGBA1C 5.3 01/23/2019   HGBA1C 5.6 10/12/2018   HGBA1C 6.1 01/28/2017   Lab Results  Component Value Date   INSULIN 6.7 06/22/2019   INSULIN 6.2 01/23/2019   INSULIN 5.9 10/12/2018   CBC    Component Value Date/Time   WBC 6.5 01/23/2019 1518   WBC 6.4 02/08/2018 1125   RBC 4.61 01/23/2019 1518   RBC 4.21 02/08/2018 1125   HGB 11.5 01/23/2019 1518   HCT 38.0 01/23/2019 1518   PLT 243 10/12/2018 1325   MCV 82 01/23/2019 1518   MCH 24.9 (L) 01/23/2019 1518   MCH 26.4 (L) 02/08/2018 1125   MCHC 30.3 (L) 01/23/2019 1518   MCHC 33.2 02/08/2018 1125   RDW 14.4 01/23/2019 1518   LYMPHSABS 2.1 01/23/2019 1518   MONOABS 0.4 07/26/2017 1028   EOSABS 0.1 01/23/2019 1518   BASOSABS 0.0 01/23/2019 1518   Iron/TIBC/Ferritin/ %Sat    Component Value Date/Time   IRON 52 12/12/2007 1500   IRONPCTSAT 14.6 (L) 12/12/2007 1500   Lipid Panel     Component Value Date/Time   CHOL 170 06/22/2019 0907   TRIG 103 06/22/2019 0907   HDL 46 06/22/2019 0907   CHOLHDL 6 01/30/2019 0932   VLDL 36.0 01/30/2019 0932  LDLCALC 103 (H) 06/22/2019  0907   LDLDIRECT 83.0 01/28/2017 1011   Hepatic Function Panel     Component Value Date/Time   PROT 6.6 06/22/2019 0907   ALBUMIN 4.5 06/22/2019 0907   AST 36 06/22/2019 0907   ALT 60 (H) 06/22/2019 0907   ALKPHOS 58 06/22/2019 0907   BILITOT 0.3 06/22/2019 0907   BILIDIR 0.0 05/02/2015 0823      Component Value Date/Time   TSH 1.830 10/12/2018 1325   TSH 2.58 04/06/2016 1408   TSH 2.86 07/16/2014 1051      OBESITY BEHAVIORAL INTERVENTION VISIT  Today's visit was # 19   Starting weight: 189 lbs Starting date: 10/12/18 Today's weight : 164 lbs Today's date: 08/21/2019 Total lbs lost to date: 25    ASK: We discussed the diagnosis of obesity with Veverly Fells today and [name] agreed to give [name] permission to discuss obesity behavioral modification therapy today.  ASSESS: Shakeda has the diagnosis of obesity and her BMI today is 29.06 Mang is in the action stage of change   ADVISE: Jazlyne was educated on the multiple health risks of obesity as well as the benefit of weight loss to improve her health. She was advised of the need for long term treatment and the importance of lifestyle modifications to improve her current health and to decrease her risk of future health problems.  AGREE: Multiple dietary modification options and treatment options were discussed and  Lashia agreed to follow the recommendations documented in the above note.  ARRANGE: Coralene was educated on the importance of frequent visits to treat obesity as outlined per CMS and USPSTF guidelines and agreed to schedule her next follow up appointment today.  I, Trixie Dredge, am acting as transcriptionist for Ilene Qua, MD  I have reviewed the above documentation for accuracy and completeness, and I agree with the above. - Ilene Qua, MD

## 2019-08-31 ENCOUNTER — Other Ambulatory Visit (INDEPENDENT_AMBULATORY_CARE_PROVIDER_SITE_OTHER): Payer: Self-pay | Admitting: Family Medicine

## 2019-08-31 DIAGNOSIS — I10 Essential (primary) hypertension: Secondary | ICD-10-CM

## 2019-09-05 ENCOUNTER — Ambulatory Visit (INDEPENDENT_AMBULATORY_CARE_PROVIDER_SITE_OTHER): Payer: Medicare Other | Admitting: Family Medicine

## 2019-09-05 ENCOUNTER — Other Ambulatory Visit: Payer: Self-pay

## 2019-09-05 ENCOUNTER — Encounter (INDEPENDENT_AMBULATORY_CARE_PROVIDER_SITE_OTHER): Payer: Self-pay | Admitting: Family Medicine

## 2019-09-05 VITALS — BP 128/87 | HR 60 | Temp 98.4°F | Ht 63.0 in | Wt 162.0 lb

## 2019-09-05 DIAGNOSIS — I10 Essential (primary) hypertension: Secondary | ICD-10-CM | POA: Diagnosis not present

## 2019-09-05 DIAGNOSIS — E669 Obesity, unspecified: Secondary | ICD-10-CM | POA: Diagnosis not present

## 2019-09-05 DIAGNOSIS — Z9189 Other specified personal risk factors, not elsewhere classified: Secondary | ICD-10-CM | POA: Diagnosis not present

## 2019-09-05 DIAGNOSIS — Z6830 Body mass index (BMI) 30.0-30.9, adult: Secondary | ICD-10-CM | POA: Diagnosis not present

## 2019-09-05 DIAGNOSIS — E8881 Metabolic syndrome: Secondary | ICD-10-CM | POA: Diagnosis not present

## 2019-09-05 MED ORDER — HYDROCHLOROTHIAZIDE 12.5 MG PO TABS: 12.5000 mg | ORAL_TABLET | Freq: Every day | ORAL | 0 refills | Status: DC

## 2019-09-07 NOTE — Progress Notes (Signed)
Office: [phone]  /  Fax: [phone]   HPI:   Chief Complaint: OBESITY Kristin Delgado is here to discuss her progress with her obesity treatment plan. She is on the keep a food journal with 1250 calories and 94 grams of protein daily and is following her eating plan approximately 90 % of the time. She states she is walking 20 minutes 2 times per week. Kristin Delgado is meeting her protein and calorie goals most days. She is eating more regularly. Her weight is 162 lb (73.5 kg) today and has had a weight loss of 2 pounds over a period of 2 weeks since her last visit. She has lost 27 lbs since starting treatment with [name].  Hypertension Kristin Delgado is a 63 y.o. female with hypertension. Her blood pressure is well controlled on HCTZ and Lisinopril. Kristin Delgado denies chest pain or shortness of breath on exertion. She is working weight loss to help control her blood pressure with the goal of decreasing her risk of heart attack and stroke. BP Readings from Last 3 Encounters:  09/05/19 128/87  08/21/19 125/75  08/08/19 124/70     Insulin Resistance Kristin Delgado has a diagnosis of insulin resistance based on her elevated fasting insulin level >5. Although Kristin Delgado's blood glucose readings are still under good control, insulin resistance puts her at greater risk of metabolic syndrome and diabetes. She is not taking metformin currently and continues to work on diet and exercise to decrease risk of diabetes. Kristin Delgado denies polyphagia. Lab Results  Component Value Date   HGBA1C 5.2 06/22/2019    At risk for cardiovascular disease Kristin Delgado is at a higher than average risk for cardiovascular disease due to obesity, hypertension and insulin resistance. She currently denies any chest pain.  ASSESSMENT AND PLAN:  Essential hypertension - Plan: hydrochlorothiazide (HYDRODIURIL) 12.5 MG tablet, DISCONTINUED: hydrochlorothiazide (HYDRODIURIL) 12.5 MG tablet  Insulin resistance  At risk for heart  disease  Class 1 obesity with serious comorbidity and body mass index (BMI) of 30.0 to 30.9 in adult, unspecified obesity type - BMI> greater than 30 at start of program   PLAN:  Hypertension We discussed sodium restriction, working on healthy weight loss, and a regular exercise program as the means to achieve improved blood pressure control. Kristin Delgado agreed with this plan and agreed to follow up as directed. We will continue to monitor her blood pressure as well as her progress with the above lifestyle modifications. Kristin Delgado agrees to continue HCTZ 12.5 mg #30 with no refills and she will watch for signs of hypotension as she continues her lifestyle modifications.  Insulin Resistance Kristin Delgado will continue to work on weight loss, exercise, and decreasing simple carbohydrates in her diet to help decrease the risk of diabetes. She was informed that eating too many simple carbohydrates or too many calories at one sitting increases the likelihood of GI side effects. Kristin Delgado will continue with the meal plan and follow up with [name] as directed to monitor her progress.  Cardiovascular risk counselling Kristin Delgado was given extended (15 minutes) coronary artery disease prevention counseling today. She is 63 y.o. female and has risk factors for heart disease including obesity, hypertension and insulin resistance. We discussed intensive lifestyle modifications today with an emphasis on specific weight loss instructions and strategies. Pt was also informed of the importance of increasing exercise and decreasing saturated fats to help prevent heart disease.  Obesity Kristin Delgado is currently in the action stage of change. As such, her goal is to continue with weight loss efforts She  has agreed to keep a food journal with 1150 to 1250 calories and 90 grams of protein daily Kristin Delgado has been instructed to work up to a goal of 150 minutes of combined cardio and strengthening exercise per week for weight loss and overall  health benefits. We discussed the following Behavioral Modification Strategies today: planning for success, no skipping meals and increasing lean protein intake  We discussed goal weight today and decided on 150 to 155 pounds.  Kristin Delgado has agreed to follow up with our clinic in 2 to 3 weeks. She was informed of the importance of frequent follow up visits to maximize her success with intensive lifestyle modifications for her multiple health conditions.  ALLERGIES: No Known Allergies  MEDICATIONS: Current Outpatient Medications on File Prior to Visit  Medication Sig Dispense Refill   acetaminophen (TYLENOL ARTHRITIS PAIN) 650 MG CR tablet Take 650 mg by mouth every 8 (eight) hours as needed. pain      Ascorbic Acid (VITAMIN C) 1000 MG tablet Take 1,000 mg by mouth 2 (two) times daily.       atorvastatin (LIPITOR) 20 MG tablet Take 1 tablet (20 mg total) by mouth daily. 90 tablet 1   b complex vitamins tablet Take 1 tablet by mouth daily.     bromocriptine (PARLODEL) 2.5 MG tablet Take 1 tablet (2.5 mg total) by mouth daily. 90 tablet 3   Calcium Polycarbophil (FIBER-CAPS PO) Take by mouth.     CALCIUM-MAGNESIUM-ZINC PO Take by mouth.     famotidine (PEPCID AC) 10 MG chewable tablet Chew 10 mg by mouth daily.       fenofibrate 160 MG tablet TAKE 1 TABLET DAILY 90 tablet 1   ferrous sulfate 325 (65 FE) MG EC tablet Take 325 mg by mouth 3 (three) times daily with meals.     glucosamine-chondroitin 500-400 MG tablet Take 1 tablet by mouth 3 (three) times daily.     lisinopril (ZESTRIL) 10 MG tablet Take 0.5 tablets (5 mg total) by mouth daily. 45 tablet 0   Multiple Vitamin (MULTIVITAMIN) tablet Take 1 tablet by mouth daily.       Omega 3-6-9 Fatty Acids (OMEGA 3-6-9 COMPLEX PO) Take by mouth.     Omega-3 Fatty Acids (FISH OIL) 1200 MG CAPS Take 1 capsule by mouth 2 (two) times daily.     polyethylene glycol powder (GLYCOLAX/MIRALAX) powder Take 17 g by mouth daily. 3350 g 0    Probiotic Product (PROBIOTIC ADVANCED PO) Take by mouth.     ST JOHNS WORT PO Take by mouth.     No current facility-administered medications on file prior to visit.     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  Respiratory: Negative for shortness of breath (on exertion).   Cardiovascular: Negative for chest pain.  Endo/Heme/Allergies:       Negative for polyphagia    PHYSICAL EXAM: Blood pressure 128/87, pulse 60, temperature 98.4 F (36.9 C), temperature source Oral, height 5\' 3"  (1.6 m), weight 162 lb (73.5 kg), SpO2 97 %. Body mass index is 28.7 kg/m. Physical Exam Vitals signs reviewed.  Constitutional:      Appearance: Normal appearance. She is well-developed. She is obese.  Cardiovascular:     Rate and Rhythm: Normal rate.  Pulmonary:     Effort: Pulmonary effort is normal.  Musculoskeletal: Normal range of  motion.  Skin:    General: Skin is warm and dry.  Neurological:     Mental Status: She is alert and oriented to person, place, and time.  Psychiatric:        Mood and Affect: Mood normal.        Behavior: Behavior normal.     RECENT LABS AND TESTS: BMET    Component Value Date/Time   NA 142 06/22/2019 0907   K 3.9 06/22/2019 0907   CL 100 06/22/2019 0907   CO2 24 06/22/2019 0907   GLUCOSE 91 06/22/2019 0907   GLUCOSE 110 (H) 08/01/2018 0932   BUN 25 06/22/2019 0907   CREATININE 0.74 06/22/2019 0907   CALCIUM 10.0 06/22/2019 0907   GFRNONAA 86 06/22/2019 0907   GFRAA 99 06/22/2019 0907   Lab Results  Component Value Date   HGBA1C 5.2 06/22/2019   HGBA1C 5.3 01/23/2019   HGBA1C 5.6 10/12/2018   HGBA1C 6.1 01/28/2017   Lab Results  Component Value Date   INSULIN 6.7 06/22/2019   INSULIN 6.2 01/23/2019   INSULIN 5.9 10/12/2018   CBC    Component Value Date/Time   WBC 6.5 01/23/2019 1518   WBC 6.4 02/08/2018 1125   RBC 4.61 01/23/2019 1518   RBC 4.21 02/08/2018 1125   HGB 11.5 01/23/2019 1518   HCT 38.0 01/23/2019 1518   PLT 243 10/12/2018 1325   MCV 82 01/23/2019 1518   MCH 24.9 (L) 01/23/2019 1518   MCH 26.4 (L) 02/08/2018 1125   MCHC 30.3 (L) 01/23/2019 1518   MCHC 33.2 02/08/2018 1125   RDW 14.4 01/23/2019 1518   LYMPHSABS 2.1 01/23/2019 1518   MONOABS 0.4 07/26/2017 1028   EOSABS 0.1 01/23/2019 1518   BASOSABS 0.0 01/23/2019 1518   Iron/TIBC/Ferritin/ %Sat    Component Value Date/Time   IRON 52 12/12/2007 1500   IRONPCTSAT 14.6 (L) 12/12/2007 1500   Lipid Panel     Component Value Date/Time   CHOL 170 06/22/2019 0907   TRIG 103 06/22/2019 0907   HDL 46 06/22/2019 0907   CHOLHDL 6 01/30/2019 0932   VLDL 36.0 01/30/2019 0932   LDLCALC 103 (H) 06/22/2019 0907   LDLDIRECT 83.0 01/28/2017 1011   Hepatic Function Panel     Component Value Date/Time   PROT 6.6  06/22/2019 0907   ALBUMIN 4.5 06/22/2019 0907   AST 36 06/22/2019 0907   ALT 60  (H) 06/22/2019 0907   ALKPHOS 58 06/22/2019 0907   BILITOT 0.3 06/22/2019 0907   BILIDIR 0.0 05/02/2015 0823      Component Value Date/Time   TSH 1.830 10/12/2018 1325   TSH 2.58 04/06/2016 1408   TSH 2.86 07/16/2014 1051     Ref. Range 01/23/2019 15:18  Vitamin D, 25-Hydroxy Latest Ref Range: 30.0 - 100.0 ng/mL 52.5    OBESITY BEHAVIORAL INTERVENTION VISIT  Today's visit was # 20   Starting weight: 189 lbs Starting date: 10/12/2018 Today's weight : 162 lbs  Today's date: 09/05/2019 Total lbs lost to date: 27    09/05/2019  Height 5\' 3"  (1.6 m)  Weight 162 lb (73.5 kg)  BMI (Calculated) 28.7  BLOOD PRESSURE - SYSTOLIC 0000000  BLOOD PRESSURE - DIASTOLIC 87   Body Fat % [id number] %  Total Body Water (lbs) 70.4 lbs    ASK: We discussed the diagnosis of obesity with Kristin Delgado today and Kristin Delgado agreed to give [name] permission to discuss obesity behavioral modification therapy today.  ASSESS: Kristin Delgado has the diagnosis of obesity and her BMI today is 28.7 Kristin Delgado is in the action stage of change   ADVISE: Kristin Delgado was educated on the multiple health risks of obesity as well as the benefit of weight loss to improve her health. She was advised of the need for long term treatment and the importance of lifestyle modifications to improve her current health and to decrease her risk of future health problems.  AGREE: Multiple dietary modification options and treatment options were discussed and  Doratha agreed to follow the recommendations documented in the above note.  ARRANGE: Kristin Delgado was educated on the importance of frequent visits to treat obesity as outlined per CMS and USPSTF guidelines and agreed to schedule her next follow up appointment today.  Corey Skains, am acting as Location manager for [employer], FNP-C.  I have reviewed the above documentation for accuracy and completeness, and I agree with the above.  - Jeray Shugart, FNP-C.

## 2019-09-11 ENCOUNTER — Encounter (INDEPENDENT_AMBULATORY_CARE_PROVIDER_SITE_OTHER): Payer: Self-pay | Admitting: Family Medicine

## 2019-09-11 DIAGNOSIS — E88819 Insulin resistance, unspecified: Secondary | ICD-10-CM | POA: Insufficient documentation

## 2019-09-11 DIAGNOSIS — Z6830 Body mass index (BMI) 30.0-30.9, adult: Secondary | ICD-10-CM | POA: Insufficient documentation

## 2019-09-11 DIAGNOSIS — E66811 Obesity, class 1: Secondary | ICD-10-CM | POA: Insufficient documentation

## 2019-09-11 DIAGNOSIS — E8881 Metabolic syndrome: Secondary | ICD-10-CM | POA: Insufficient documentation

## 2019-09-11 DIAGNOSIS — E669 Obesity, unspecified: Secondary | ICD-10-CM | POA: Insufficient documentation

## 2019-09-26 ENCOUNTER — Ambulatory Visit (INDEPENDENT_AMBULATORY_CARE_PROVIDER_SITE_OTHER): Payer: Medicare Other | Admitting: Family Medicine

## 2019-09-26 ENCOUNTER — Other Ambulatory Visit: Payer: Self-pay

## 2019-09-26 ENCOUNTER — Encounter (INDEPENDENT_AMBULATORY_CARE_PROVIDER_SITE_OTHER): Payer: Self-pay | Admitting: Family Medicine

## 2019-09-26 VITALS — BP 128/76 | HR 57 | Temp 98.3°F | Ht 63.0 in | Wt 162.0 lb

## 2019-09-26 DIAGNOSIS — Z6830 Body mass index (BMI) 30.0-30.9, adult: Secondary | ICD-10-CM

## 2019-09-26 DIAGNOSIS — E8881 Metabolic syndrome: Secondary | ICD-10-CM

## 2019-09-26 DIAGNOSIS — I10 Essential (primary) hypertension: Secondary | ICD-10-CM

## 2019-09-26 DIAGNOSIS — E88819 Insulin resistance, unspecified: Secondary | ICD-10-CM

## 2019-09-26 DIAGNOSIS — E669 Obesity, unspecified: Secondary | ICD-10-CM

## 2019-09-26 MED ORDER — HYDROCHLOROTHIAZIDE 12.5 MG PO TABS: 12.5000 mg | ORAL_TABLET | Freq: Every day | ORAL | 0 refills | Status: DC

## 2019-09-27 ENCOUNTER — Encounter (INDEPENDENT_AMBULATORY_CARE_PROVIDER_SITE_OTHER): Payer: Self-pay | Admitting: Family Medicine

## 2019-09-27 NOTE — Progress Notes (Signed)
Office: [phone]  /  Fax: [phone]   HPI:   Chief Complaint: OBESITY Kristin Delgado is here to discuss her progress with her obesity treatment plan. She is on the keep a food journal with 1250 calories and 94 grams of protein daily plan and she is following her eating plan approximately 90 % of the time. She states she is exercising 0 minutes 0 times per week. Kristin Delgado struggles with fatigue, but she is retiring within the next month from [employer] 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 They tend to eat out at lunch, which is their biggest meal. She looks forward to cooking more when she retires. She does supplement with a protein shake most days. Her weight is 162 lb (73.5 kg) today and she has maintained weight since her last visit. She has lost 27 lbs since starting treatment with [name].  Hypertension Kristin Delgado is a 63 y.o. female with hypertension. Kristin Delgado's blood pressure is well controlled on HCTZ and Lisinopril. Kristin Delgado denies chest pain or shortness of breath on exertion. She is working weight loss to help control her blood pressure with the goal of decreasing her risk of heart attack and stroke.  BP Readings from Last 3 Encounters:  09/26/19 128/76  09/05/19 128/87  08/21/19 125/75    Insulin Resistance Kristin Delgado has a diagnosis of insulin resistance based on her elevated fasting insulin level >5. Although Kristin Delgado's blood glucose readings are still under good control, insulin resistance puts her at greater risk of metabolic syndrome and diabetes. Kristin Delgado is not on metformin and she continues to work on diet and exercise to decrease risk of diabetes. Kristin Delgado denies polyphagia. Lab Results  Component Value Date   HGBA1C 5.2 06/22/2019    ASSESSMENT AND PLAN:  Essential hypertension - Plan: hydrochlorothiazide (HYDRODIURIL) 12.5 MG tablet  Insulin resistance  Class 1 obesity with serious comorbidity and body mass index (BMI) of 30.0 to 30.9 in adult, unspecified  obesity type - BMI. 30 when pt started progream  PLAN:  Hypertension We discussed sodium restriction, working on healthy weight loss, and a regular exercise program as the means to achieve improved blood pressure control. Kristin Delgado agreed with this plan and agreed to follow up as directed. We will continue to monitor her blood pressure as well as her progress with the above lifestyle modifications. Kristin Delgado agrees to continue HCTZ 12.5 mg daily #30 with no refills and she will watch for signs of hypotension as she continues her lifestyle modifications.  Insulin Resistance Kristin Delgado will continue to work on weight loss, exercise, and decreasing simple carbohydrates in her diet to help decrease the risk of diabetes. Kristin Delgado will continue with the meal plan and she will follow up with [name] as directed to monitor her progress.  Obesity Kristin Delgado is currently in the action stage of change. As such, her goal is to continue with weight loss efforts She has agreed to keep a food journal with 1150 to 1250 calories and 90 grams of protein daily We discussed increasing her exercise once she retires.  We discussed the following Behavioral Modification Strategies today: planning for success and decrease eating out  Kristin Delgado has agreed to follow up with our clinic in 3 weeks. She was informed of the importance of frequent follow up visits to maximize her success with intensive lifestyle modifications for her multiple health conditions.  ALLERGIES: No Known Allergies  MEDICATIONS: Current Outpatient Medications on File Prior to Visit  Medication Sig Dispense Refill  . acetaminophen (  TYLENOL ARTHRITIS PAIN) 650 MG CR tablet Take 650 mg by mouth every 8 (eight) hours as needed. pain     . Ascorbic Acid (VITAMIN C) 1000 MG tablet Take 1,000 mg by mouth 2 (two) times daily.      [name] atorvastatin (LIPITOR) 20 MG tablet Take 1 tablet (20 mg total) by mouth daily. 90 tablet 1  . b complex vitamins tablet Take 1 tablet  by mouth daily.    . bromocriptine (PARLODEL) 2.5 MG tablet Take 1 tablet (2.5 mg total) by mouth daily. 90 tablet 3  . Calcium Polycarbophil (FIBER-CAPS PO) Take by mouth.    [name] CALCIUM-MAGNESIUM-ZINC PO Take by mouth.    . famotidine (PEPCID AC) 10 MG chewable tablet Chew 10 mg by mouth daily.      . fenofibrate 160 MG tablet TAKE 1 TABLET DAILY 90 tablet 1  . ferrous sulfate 325 (65 FE) MG EC tablet Take 325 mg by mouth 3 (three) times daily with meals.    [name] glucosamine-chondroitin 500-400 MG tablet Take 1 tablet by mouth 3 (three) times daily.    [name] lisinopril (ZESTRIL) 10 MG tablet Take 0.5 tablets (5 mg total) by mouth daily. 45 tablet 0  . Multiple Vitamin (MULTIVITAMIN) tablet Take 1 tablet by mouth daily.      . Omega 3-6-9 Fatty Acids (OMEGA 3-6-9 COMPLEX PO) Take by mouth.    . Omega-3 Fatty Acids (FISH OIL) 1200 MG CAPS Take 1 capsule by mouth 2 (two) times daily.    . polyethylene glycol powder (GLYCOLAX/MIRALAX) powder Take 17 g by mouth daily. 3350 g 0  . Probiotic Product (PROBIOTIC ADVANCED PO) Take by mouth.    . ST JOHNS WORT PO Take by mouth.     No current facility-administered medications on file prior to visit.     PAST MEDICAL HISTORY: Past Medical History:  Diagnosis Date  . Allergy    seasonal  . Anxiety    closed space like MRI  . Back pain \  . Chest pain 2006   admitted, essentially negative  . Constipation   . Depression   . GERD (gastroesophageal reflux disease)   . Hyperlipidemia   . Hypertension   . Infertility, female   . Joint pain   . Pituitary microadenoma ([place])   . Swallowing difficulty   . 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 Vitamin D deficiency     PAST SURGICAL HISTORY: Past Surgical History:  Procedure Laterality Date  . 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 BRAIN SURGERY  1 / 1985   microadenoma  . COCHLEAR IMPLANT  02/2013   Left side    SOCIAL HISTORY: Social History   Tobacco Use  . Smoking status: Never Smoker  .  Smokeless tobacco: Never Used  Substance Use Topics  . Alcohol use: No    Comment: rare  . Drug use: No    FAMILY HISTORY: Family History  Problem Relation Age of Onset  . Lung cancer Mother   . Cancer Mother 66       lung   . Alzheimer's disease Father   . Dementia Father     ROS: Review of Systems  Constitutional: Negative for weight loss.  Respiratory: Negative for shortness of breath (on exertion).   Cardiovascular: Negative for chest pain.  Endo/Heme/Allergies:       Negative for polyphagia    PHYSICAL EXAM: Blood pressure 128/76, pulse (!) 57, temperature 98.3 F (36.8 C), temperature source Oral, height  5\' 3"  (1.6 m), weight 162 lb (73.5 kg), SpO2 98 %. Body mass index is 28.7 kg/m. Physical Exam Vitals signs reviewed.  Constitutional:      Appearance: Normal appearance. She is well-developed. She is obese.  Cardiovascular:     Rate and Rhythm: Normal rate.  Pulmonary:     Effort: Pulmonary effort is normal.  Musculoskeletal: Normal range of motion.  Skin:    General: Skin is warm and dry.  Neurological:     Mental Status: She is alert and oriented to person, place, and time.  Psychiatric:        Mood and Affect: Mood normal.        Behavior: Behavior normal.     RECENT LABS AND TESTS: BMET    Component Value Date/Time   NA 142 06/22/2019 0907   K 3.9 06/22/2019 0907   CL 100 06/22/2019 0907   CO2 24 06/22/2019 0907   GLUCOSE 91 06/22/2019 0907   GLUCOSE 110 (H) 08/01/2018 0932   BUN 25 06/22/2019 0907   CREATININE 0.74 06/22/2019 0907   CALCIUM 10.0 06/22/2019 0907   GFRNONAA 86 06/22/2019 0907   GFRAA 99 06/22/2019 0907   Lab Results  Component Value Date   HGBA1C 5.2 06/22/2019   HGBA1C 5.3 01/23/2019   HGBA1C 5.6 10/12/2018   HGBA1C 6.1 01/28/2017   Lab Results  Component Value Date   INSULIN 6.7 06/22/2019   INSULIN 6.2 01/23/2019   INSULIN 5.9 10/12/2018   CBC    Component Value Date/Time   WBC 6.5 01/23/2019 1518   WBC  6.4 02/08/2018 1125   RBC 4.61 01/23/2019 1518   RBC 4.21 02/08/2018 1125   HGB 11.5 01/23/2019 1518   HCT 38.0 01/23/2019 1518   PLT 243 10/12/2018 1325   MCV 82 01/23/2019 1518   MCH 24.9 (L) 01/23/2019 1518   MCH 26.4 (L) 02/08/2018 1125   MCHC 30.3 (L) 01/23/2019 1518   MCHC 33.2 02/08/2018 1125   RDW 14.4 01/23/2019 1518   LYMPHSABS 2.1 01/23/2019 1518   MONOABS 0.4 07/26/2017 1028   EOSABS 0.1 01/23/2019 1518   BASOSABS 0.0 01/23/2019 1518   Iron/TIBC/Ferritin/ %Sat    Component Value Date/Time   IRON 52 12/12/2007 1500   IRONPCTSAT 14.6 (L) 12/12/2007 1500   Lipid Panel     Component Value Date/Time   CHOL 170 06/22/2019 0907   TRIG 103 06/22/2019 0907   HDL 46 06/22/2019 0907   CHOLHDL 6 01/30/2019 0932   VLDL 36.0 01/30/2019 0932   LDLCALC 103 (H) 06/22/2019 0907   LDLDIRECT 83.0 01/28/2017 1011   Hepatic Function Panel     Component Value Date/Time   PROT 6.6 06/22/2019 0907   ALBUMIN 4.5 06/22/2019 0907   AST 36 06/22/2019 0907   ALT 60 (H) 06/22/2019 0907   ALKPHOS 58 06/22/2019 0907   BILITOT 0.3 06/22/2019 0907   BILIDIR 0.0 05/02/2015 0823      Component Value Date/Time   TSH 1.830 10/12/2018 1325   TSH 2.58 04/06/2016 1408   TSH 2.86 07/16/2014 1051     Ref. Range 01/23/2019 15:18  Vitamin D, 25-Hydroxy Latest Ref Range: 30.0 - 100.0 ng/mL 52.5    OBESITY BEHAVIORAL INTERVENTION VISIT  Today's visit was # 21   Starting weight: 189 lbs Starting date: 10/12/2018 Today's weight : 162 lbs Today's date: 09/26/2019 Total lbs lost to date: 27    09/26/2019  Height 5\' 3"  (1.6 m)  Weight 162 lb (73.5 kg)  BMI (  Calculated) 28.7  BLOOD PRESSURE - SYSTOLIC 0000000  BLOOD PRESSURE - DIASTOLIC 76   Body Fat % 44 %  Total Body Water (lbs) 70.6 lbs    ASK: We discussed the diagnosis of obesity with Kristin Delgado today and Kristin Delgado agreed to give [name] permission to discuss obesity behavioral modification therapy today.  ASSESS: Kristin Delgado has the  diagnosis of obesity and her BMI today is 28.7 Kristin Delgado is in the action stage of change   ADVISE: Kristin Delgado was educated on the multiple health risks of obesity as well as the benefit of weight loss to improve her health. She was advised of the need for long term treatment and the importance of lifestyle modifications to improve her current health and to decrease her risk of future health problems.  AGREE: Multiple dietary modification options and treatment options were discussed and  Kristin Delgado agreed to follow the recommendations documented in the above note.  ARRANGE: Kristin Delgado was educated on the importance of frequent visits to treat obesity as outlined per CMS and USPSTF guidelines and agreed to schedule her next follow up appointment today.  I, Doreene Nest, am acting as transcriptionist for [employer], FNP-C  I have reviewed the above documentation for accuracy and completeness, and I agree with the above.  - Theordore Cisnero, FNP-C.

## 2019-10-17 ENCOUNTER — Ambulatory Visit (INDEPENDENT_AMBULATORY_CARE_PROVIDER_SITE_OTHER): Payer: Medicare Other | Admitting: Family Medicine

## 2019-10-17 ENCOUNTER — Other Ambulatory Visit: Payer: Self-pay

## 2019-10-17 ENCOUNTER — Encounter (INDEPENDENT_AMBULATORY_CARE_PROVIDER_SITE_OTHER): Payer: Self-pay | Admitting: Family Medicine

## 2019-10-17 VITALS — BP 154/81 | HR 53 | Temp 97.7°F | Ht 63.0 in | Wt 165.0 lb

## 2019-10-17 DIAGNOSIS — K5909 Other constipation: Secondary | ICD-10-CM | POA: Diagnosis not present

## 2019-10-17 DIAGNOSIS — Z6830 Body mass index (BMI) 30.0-30.9, adult: Secondary | ICD-10-CM | POA: Diagnosis not present

## 2019-10-17 DIAGNOSIS — E669 Obesity, unspecified: Secondary | ICD-10-CM

## 2019-10-17 DIAGNOSIS — E88819 Insulin resistance, unspecified: Secondary | ICD-10-CM

## 2019-10-17 DIAGNOSIS — E8881 Metabolic syndrome: Secondary | ICD-10-CM | POA: Diagnosis not present

## 2019-10-17 DIAGNOSIS — E66811 Obesity, class 1: Secondary | ICD-10-CM

## 2019-10-18 ENCOUNTER — Encounter (INDEPENDENT_AMBULATORY_CARE_PROVIDER_SITE_OTHER): Payer: Self-pay | Admitting: Family Medicine

## 2019-10-18 NOTE — Progress Notes (Signed)
Office: [phone]  /  Fax: [phone]   HPI:   Chief Complaint: OBESITY Kristin Delgado is here to discuss her progress with her obesity treatment plan. She is on the keep a food journal with 1250 calories and 94 grams of protein daily and she is following her eating plan approximately 90 % of the time. She states she is walking 20 to 30 minutes 4 to 5 times per week. Kristin Delgado feels she may be up with weight due to constipation. She did indulge a bit over Thanksgiving.  Kristin Delgado also had a birthday two days ago and she had prime rib. She is now back on track. Her weight is 165 lb (74.8 kg) today and has had a weight gain of 3 pounds over a period of 3 weeks since her last visit. She has lost 24 lbs since starting treatment with [name].  Insulin Resistance Kristin Delgado has a diagnosis of insulin resistance based on her elevated fasting insulin level >5. She was previously prediabetic. Although Kristin Delgado's blood glucose readings are still under good control, insulin resistance puts her at greater risk of metabolic syndrome and diabetes. Kristin Delgado is not on metformin. She continues to work on diet and exercise to decrease risk of diabetes. Kristin Delgado denies polyphagia. Lab Results  Component Value Date   HGBA1C 5.2 06/22/2019     Constipation Kristin Delgado has constipation and she usually takes fiber pills (6 to 8 per day), but she has been off of them the past few days. She has no blood in her stool. Kristin Delgado does not drink enough water.  ASSESSMENT AND PLAN:  Insulin resistance  Other constipation  Class 1 obesity with serious comorbidity and body mass index (BMI) of 30.0 to 30.9 in adult, unspecified obesity type - BMI greater than 30 at start of program   PLAN:  Insulin Resistance Kristin Delgado will continue to work on weight loss, exercise, and decreasing simple carbohydrates in her diet to help decrease the risk of diabetes. Kristin Delgado will continue with the meal plan and follow up with [name] as directed to monitor  her progress.  Constipation Kristin Delgado will continue fiber capsules and she will increase her H20 intake. Kristin Delgado agrees to follow up with our clinic in 2 to 3 weeks.  Obesity Kristin Delgado is currently in the action stage of change. As such, her goal is to continue with weight loss efforts She has agreed to keep a food journal with 1150 to 1250 calories and 90 grams of protein daily Kristin Delgado has been instructed to work up to a goal of 150 minutes of combined cardio and strengthening exercise per week for weight loss and overall health benefits. We discussed the following Behavioral Modification Strategies today: planning for success, keep a strict food journal, increase H2O intake, decreasing simple carbohydrates   Kristin Delgado has agreed to follow up with our clinic in 2 to 3 weeks. She was informed of the importance of frequent follow up visits to maximize her success with intensive lifestyle modifications for her multiple health conditions.  ALLERGIES: No Known Allergies  MEDICATIONS: Current Outpatient Medications on File Prior to Visit  Medication Sig Dispense Refill  . acetaminophen (TYLENOL ARTHRITIS PAIN) 650 MG CR tablet Take 650 mg by mouth every 8 (eight) hours as needed. pain     . Ascorbic Acid (VITAMIN C) 1000 MG tablet Take 1,000 mg by mouth 2 (two) times daily.      [name] atorvastatin (LIPITOR) 20 MG tablet Take 1 tablet (20 mg total) by mouth daily. 90 tablet 1  .  b complex vitamins tablet Take 1 tablet by mouth daily.    . bromocriptine (PARLODEL) 2.5 MG tablet Take 1 tablet (2.5 mg total) by mouth daily. 90 tablet 3  . Calcium Polycarbophil (FIBER-CAPS PO) Take by mouth.    [name] CALCIUM-MAGNESIUM-ZINC PO Take by mouth.    . cholecalciferol (VITAMIN D3) 25 MCG (1000 UT) tablet Take 5,000 Units by mouth daily.    . famotidine (PEPCID AC) 10 MG chewable tablet Chew 10 mg by mouth daily.      . fenofibrate 160 MG tablet TAKE 1 TABLET DAILY 90 tablet 1  . ferrous sulfate 325 (65 FE) MG EC  tablet Take 325 mg by mouth 3 (three) times daily with meals.    [name] glucosamine-chondroitin 500-400 MG tablet Take 1 tablet by mouth 3 (three) times daily.    . hydrochlorothiazide (HYDRODIURIL) 12.5 MG tablet Take 1 tablet (12.5 mg total) by mouth daily. 30 tablet 0  . lisinopril (ZESTRIL) 10 MG tablet Take 0.5 tablets (5 mg total) by mouth daily. 45 tablet 0  . Multiple Vitamin (MULTIVITAMIN) tablet Take 1 tablet by mouth daily.      . Omega 3-6-9 Fatty Acids (OMEGA 3-6-9 COMPLEX PO) Take by mouth.    . Omega-3 Fatty Acids (FISH OIL) 1200 MG CAPS Take 1 capsule by mouth 2 (two) times daily.    . polyethylene glycol powder (GLYCOLAX/MIRALAX) powder Take 17 g by mouth daily. 3350 g 0  . Probiotic Product (PROBIOTIC ADVANCED PO) Take by mouth.    . ST JOHNS WORT PO Take by mouth.     No current facility-administered medications on file prior to visit.     PAST MEDICAL HISTORY: Past Medical History:  Diagnosis Date  . Allergy    seasonal  . Anxiety    closed space like MRI  . Back pain \  . Chest pain 2006   admitted, essentially negative  . Constipation   . Depression   . GERD (gastroesophageal reflux disease)   . Hyperlipidemia   . Hypertension   . Infertility, female   . Joint pain   . Pituitary microadenoma ([place])   . Swallowing difficulty   . 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 Vitamin D deficiency     PAST SURGICAL HISTORY: Past Surgical History:  Procedure Laterality Date  . 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 BRAIN SURGERY  1 / 1985   microadenoma  . COCHLEAR IMPLANT  02/2013   Left side    SOCIAL HISTORY: Social History   Tobacco Use  . Smoking status: Never Smoker  . Smokeless tobacco: Never Used  Substance Use Topics  . Alcohol use: No    Comment: rare  . Drug use: No    FAMILY HISTORY: Family History  Problem Relation Age of Onset  . Lung cancer Mother   . Cancer Mother 14       lung   . Alzheimer's disease Father   . Dementia Father     ROS:  Review of Systems  Constitutional: Negative for weight loss.  Gastrointestinal: Positive for constipation. Negative for blood in stool.  Endo/Heme/Allergies:       Negative for polyphagia    PHYSICAL EXAM: Blood pressure (!) 154/81, pulse (!) 53, temperature 97.7 F (36.5 C), temperature source Oral, height 5\' 3"  (1.6 m), weight 165 lb (74.8 kg), SpO2 99 %. Body mass index is 29.23 kg/m. Physical Exam Vitals signs reviewed.  Constitutional:      Appearance: Normal appearance.  She is well-developed. She is obese.  Cardiovascular:     Rate and Rhythm: Normal rate.  Pulmonary:     Effort: Pulmonary effort is normal.  Musculoskeletal: Normal range of motion.  Skin:    General: Skin is warm and dry.  Neurological:     Mental Status: She is alert and oriented to person, place, and time.  Psychiatric:        Mood and Affect: Mood normal.        Behavior: Behavior normal.     RECENT LABS AND TESTS: BMET    Component Value Date/Time   NA 142 06/22/2019 0907   K 3.9 06/22/2019 0907   CL 100 06/22/2019 0907   CO2 24 06/22/2019 0907   GLUCOSE 91 06/22/2019 0907   GLUCOSE 110 (H) 08/01/2018 0932   BUN 25 06/22/2019 0907   CREATININE 0.74 06/22/2019 0907   CALCIUM 10.0 06/22/2019 0907   GFRNONAA 86 06/22/2019 0907   GFRAA 99 06/22/2019 0907   Lab Results  Component Value Date   HGBA1C 5.2 06/22/2019   HGBA1C 5.3 01/23/2019   HGBA1C 5.6 10/12/2018   HGBA1C 6.1 01/28/2017   Lab Results  Component Value Date   INSULIN 6.7 06/22/2019   INSULIN 6.2 01/23/2019   INSULIN 5.9 10/12/2018   CBC    Component Value Date/Time   WBC 6.5 01/23/2019 1518   WBC 6.4 02/08/2018 1125   RBC 4.61 01/23/2019 1518   RBC 4.21 02/08/2018 1125   HGB 11.5 01/23/2019 1518   HCT 38.0 01/23/2019 1518   PLT 243 10/12/2018 1325   MCV 82 01/23/2019 1518   MCH 24.9 (L) 01/23/2019 1518   MCH 26.4 (L) 02/08/2018 1125   MCHC 30.3 (L) 01/23/2019 1518   MCHC 33.2 02/08/2018 1125   RDW 14.4  01/23/2019 1518   LYMPHSABS 2.1 01/23/2019 1518   MONOABS 0.4 07/26/2017 1028   EOSABS 0.1 01/23/2019 1518   BASOSABS 0.0 01/23/2019 1518   Iron/TIBC/Ferritin/ %Sat    Component Value Date/Time   IRON 52 12/12/2007 1500   IRONPCTSAT 14.6 (L) 12/12/2007 1500   Lipid Panel     Component Value Date/Time   CHOL 170 06/22/2019 0907   TRIG 103 06/22/2019 0907   HDL 46 06/22/2019 0907   CHOLHDL 6 01/30/2019 0932   VLDL 36.0 01/30/2019 0932   LDLCALC 103 (H) 06/22/2019 0907   LDLDIRECT 83.0 01/28/2017 1011   Hepatic Function Panel     Component Value Date/Time   PROT 6.6 06/22/2019 0907   ALBUMIN 4.5 06/22/2019 0907   AST 36 06/22/2019 0907   ALT 60 (H) 06/22/2019 0907   ALKPHOS 58 06/22/2019 0907   BILITOT 0.3 06/22/2019 0907   BILIDIR 0.0 05/02/2015 0823      Component Value Date/Time   TSH 1.830 10/12/2018 1325   TSH 2.58 04/06/2016 1408   TSH 2.86 07/16/2014 1051     Ref. Range 01/23/2019 15:18  Vitamin D, 25-Hydroxy Latest Ref Range: 30.0 - 100.0 ng/mL 52.5    OBESITY BEHAVIORAL INTERVENTION VISIT  Today's visit was # 22  Starting weight: 189 lbs Starting date: 10/12/2018 Today's weight : 165 lbs Today's date: 10/17/2019 Total lbs lost to date: 24    10/17/2019  Height 5\' 3"  (1.6 m)  Weight 165 lb (74.8 kg)  BMI (Calculated) 29.24  BLOOD PRESSURE - SYSTOLIC 123456  BLOOD PRESSURE - DIASTOLIC 81   Body Fat % 123456 %  Total Body Water (lbs) 69.6 lbs    ASK: We discussed  the diagnosis of obesity with Kristin Delgado today and Kristin Delgado agreed to give [name] permission to discuss obesity behavioral modification therapy today.  ASSESS: Eralynn has the diagnosis of obesity and her BMI today is 29.24 Sarabi is in the action stage of change   ADVISE: Deniesha was educated on the multiple health risks of obesity as well as the benefit of weight loss to improve her health. She was advised of the need for long term treatment and the importance of lifestyle modifications to  improve her current health and to decrease her risk of future health problems.  AGREE: Multiple dietary modification options and treatment options were discussed and  Brytnee agreed to follow the recommendations documented in the above note.  ARRANGE: Kristin Delgado was educated on the importance of frequent visits to treat obesity as outlined per CMS and USPSTF guidelines and agreed to schedule her next follow up appointment today.  I, Doreene Nest, am acting as transcriptionist for [employer], FNP-C  I have reviewed the above documentation for accuracy and completeness, and I agree with the above.  - Adenike Shidler, FNP-C.

## 2019-11-01 ENCOUNTER — Encounter (INDEPENDENT_AMBULATORY_CARE_PROVIDER_SITE_OTHER): Payer: Self-pay | Admitting: Family Medicine

## 2019-11-01 ENCOUNTER — Other Ambulatory Visit: Payer: Self-pay

## 2019-11-01 ENCOUNTER — Telehealth (INDEPENDENT_AMBULATORY_CARE_PROVIDER_SITE_OTHER): Payer: Medicare Other | Admitting: Family Medicine

## 2019-11-01 DIAGNOSIS — E559 Vitamin D deficiency, unspecified: Secondary | ICD-10-CM

## 2019-11-01 DIAGNOSIS — Z6830 Body mass index (BMI) 30.0-30.9, adult: Secondary | ICD-10-CM | POA: Diagnosis not present

## 2019-11-01 DIAGNOSIS — Z9189 Other specified personal risk factors, not elsewhere classified: Secondary | ICD-10-CM

## 2019-11-01 DIAGNOSIS — I10 Essential (primary) hypertension: Secondary | ICD-10-CM | POA: Diagnosis not present

## 2019-11-01 DIAGNOSIS — E88819 Insulin resistance, unspecified: Secondary | ICD-10-CM

## 2019-11-01 DIAGNOSIS — E8881 Metabolic syndrome: Secondary | ICD-10-CM | POA: Diagnosis not present

## 2019-11-01 DIAGNOSIS — E669 Obesity, unspecified: Secondary | ICD-10-CM

## 2019-11-06 NOTE — Progress Notes (Signed)
Office: [phone]  /  Fax: [phone] TeleHealth Visit:  Kristin Delgado has verbally consented to this TeleHealth visit today. The patient is located at home, the provider is located at the [facility] office. The participants in this visit include the listed provider and patient. The visit was conducted today via [email or web address] (26 minutes).  HPI:  Chief Complaint: OBESITY Kristin Delgado is here to discuss her progress with her obesity treatment plan. She is on the keep a food journal with 1150-1250 calories and 90 grams of protein daily and states she is following her eating plan approximately 90 % of the time. She states she is walking 2,000 steps 5 times per week.  Kristin Delgado was on vacation last week. Weight has been stable for a few months. She retired this month but was called back in due to staffing issues. She plans to stop working completely by 2021.   She had a virtual visit with [name] today due to COVID restrictions and weather concerns.  Insulin Resistance Kristin Delgado has a diagnosis of insulin resistance. Labs are improving. Reviewed with patient.   Lab Results  Component Value Date   HGBA1C 5.2 06/22/2019   HGBA1C 5.3 01/23/2019   HGBA1C 5.6 10/12/2018   Lab Results  Component Value Date   LDLCALC 103 (H) 06/22/2019   CREATININE 0.74 06/22/2019   Hypertension Kristin Delgado has a diagnosis of hypertension. She is taking lisinopril-hydrochlorothiazide. She has had some elevated blood pressure at 167/107 and 158/98. She thinks it was due to stress and migraines. She wonders if her blood pressure cuff is showing accurate readings.   At risk for Cardiovascular Disease Kristin Delgado is at a higher than average risk for cardiovascular disease.   The 10-year ASCVD risk score Kristin Delgado DC Kristin Bonito., et al., [name]     Age: 63 years     Sex: Female     Is Non-Hispanic African American: No     Diabetic: No     Tobacco smoker: No     Systolic Blood  Pressure: 154 mmHg     Is BP treated: Yes     HDL Cholesterol: 46 mg/dL     Total Cholesterol: 170 mg/dL  Vitamin D Deficiency Kristin Delgado has a diagnosis of vitamin D deficiency. She is taking OTC Vit D 5,000 IU daily.   ASSESSMENT AND PLAN:  Insulin resistance  Essential hypertension  Vitamin D deficiency  At risk for heart disease  Class 1 obesity with serious comorbidity and body mass index (BMI) of 30.0 to 30.9 in adult, unspecified obesity type  PLAN:  Insulin Resistance Kristin Delgado will continue to work on weight loss, exercise, and decreasing simple carbohydrates to help decrease the risk of diabetes. Kristin Delgado agrees to follow up with [name] as directed to closely monitor her progress.  Hypertension Kristin Delgado is working on healthy weight loss and exercise to improve blood pressure control. We will continue to monitor and will watch for signs of hypotension as she continues her lifestyle modifications. She is to bring in her blood pressure cuff for calibration.   At risk for Cardiovascular Disease Kristin Delgado is to continue to work on weight loss, and we will continue to monitor.  Vitamin D Deficiency Low vitamin D level contributes to fatigue and are associated with obesity, breast, and colon cancer. Kristin Delgado agrees to continue taking OTC Vit D 5,000 IU daily and will follow up for routine testing of vitamin D, at least 2-3 times per year to  avoid over-replacement. Kristin Delgado agrees to follow up with our clinic in 3 weeks.  Obesity Kristin Delgado is currently in the action stage of change. As such, her goal is to continue with weight loss efforts. She has agreed to keep a food journal with 1250 calories and 94 grams of protein daily. Kristin Delgado has been instructed to work up to a goal of 150 minutes of combined cardio and strengthening exercise per week for weight loss and overall health benefits. We discussed the following Behavioral Modification Strategies today: increasing lean protein intake,  increase H20 intake, and dealing with family or coworker sabotage.   Kristin Delgado has agreed to follow up with our clinic in 3 weeks. She was informed of the importance of frequent follow up visits to maximize her success with intensive lifestyle modifications for her multiple health conditions.  ALLERGIES: No Known Allergies  MEDICATIONS: Current Outpatient Medications on File Prior to Visit  Medication Sig Dispense Refill  . acetaminophen (TYLENOL ARTHRITIS PAIN) 650 MG CR tablet Take 650 mg by mouth every 8 (eight) hours as needed. pain     . Ascorbic Acid (VITAMIN C) 1000 MG tablet Take 1,000 mg by mouth 2 (two) times daily.      [name] atorvastatin (LIPITOR) 20 MG tablet Take 1 tablet (20 mg total) by mouth daily. 90 tablet 1  . b complex vitamins tablet Take 1 tablet by mouth daily.    . bromocriptine (PARLODEL) 2.5 MG tablet Take 1 tablet (2.5 mg total) by mouth daily. 90 tablet 3  . Calcium Polycarbophil (FIBER-CAPS PO) Take by mouth.    [name] CALCIUM-MAGNESIUM-ZINC PO Take by mouth.    . cholecalciferol (VITAMIN D3) 25 MCG (1000 UT) tablet Take 5,000 Units by mouth daily.    . famotidine (PEPCID AC) 10 MG chewable tablet Chew 10 mg by mouth daily.      . fenofibrate 160 MG tablet TAKE 1 TABLET DAILY 90 tablet 1  . ferrous sulfate 325 (65 FE) MG EC tablet Take 325 mg by mouth 3 (three) times daily with meals.    [name] glucosamine-chondroitin 500-400 MG tablet Take 1 tablet by mouth 3 (three) times daily.    . hydrochlorothiazide (HYDRODIURIL) 12.5 MG tablet Take 1 tablet (12.5 mg total) by mouth daily. 30 tablet 0  . lisinopril (ZESTRIL) 10 MG tablet Take 0.5 tablets (5 mg total) by mouth daily. 45 tablet 0  . Multiple Vitamin (MULTIVITAMIN) tablet Take 1 tablet by mouth daily.      . Omega 3-6-9 Fatty Acids (OMEGA 3-6-9 COMPLEX PO) Take by mouth.    . Omega-3 Fatty Acids (FISH OIL) 1200 MG CAPS Take 1 capsule by mouth 2 (two) times daily.    . polyethylene glycol powder (GLYCOLAX/MIRALAX) powder  Take 17 g by mouth daily. 3350 g 0  . Probiotic Product (PROBIOTIC ADVANCED PO) Take by mouth.    . ST JOHNS WORT PO Take by mouth.     No current facility-administered medications on file prior to visit.    PAST MEDICAL HISTORY: Past Medical History:  Diagnosis Date  . Allergy    seasonal  . Anxiety    closed space like MRI  . Back pain \  . Chest pain 2006   admitted, essentially negative  . Constipation   . Depression   . GERD (gastroesophageal reflux disease)   . Hyperlipidemia   . Hypertension   . Infertility, female   . Joint pain   . Pituitary microadenoma ([place])   . Swallowing difficulty   .  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 Vitamin D deficiency     PAST SURGICAL HISTORY: Past Surgical History:  Procedure Laterality Date  . 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 BRAIN SURGERY  1 / 1985   microadenoma  . COCHLEAR IMPLANT  02/2013   Left side    SOCIAL HISTORY: Social History   Tobacco Use  . Smoking status: Never Smoker  . Smokeless tobacco: Never Used  Substance Use Topics  . Alcohol use: No    Comment: rare  . Drug use: No    FAMILY HISTORY: Family History  Problem Relation Age of Onset  . Lung cancer Mother   . Cancer Mother 42       lung   . Alzheimer's disease Father   . Dementia Father     ROS: Review of Systems  Constitutional: Negative for weight loss.    PHYSICAL EXAM: There were no vitals taken for this visit. There is no height or weight on file to calculate BMI.  General: Cooperative, alert, well developed, in no acute distress. HEENT: Conjunctivae and lids unremarkable. Neck: No thyromegaly.  Cardiovascular: Regular rhythm.  Lungs: Normal work of breathing. Extremities: No edema.  Neurologic: No focal deficits.   RECENT LABS AND TESTS: BMET    Component Value Date/Time   NA 142 06/22/2019 0907   K 3.9 06/22/2019 0907   CL 100 06/22/2019 0907   CO2 24 06/22/2019 0907   GLUCOSE 91 06/22/2019 0907   GLUCOSE 110 (H)  08/01/2018 0932   BUN 25 06/22/2019 0907   CREATININE 0.74 06/22/2019 0907   CALCIUM 10.0 06/22/2019 0907   GFRNONAA 86 06/22/2019 0907   GFRAA 99 06/22/2019 0907   Lab Results  Component Value Date   HGBA1C 5.2 06/22/2019   HGBA1C 5.3 01/23/2019   HGBA1C 5.6 10/12/2018   HGBA1C 6.1 01/28/2017   Lab Results  Component Value Date   INSULIN 6.7 06/22/2019   INSULIN 6.2 01/23/2019   INSULIN 5.9 10/12/2018   CBC    Component Value Date/Time   WBC 6.5 01/23/2019 1518   WBC 6.4 02/08/2018 1125   RBC 4.61 01/23/2019 1518   RBC 4.21 02/08/2018 1125   HGB 11.5 01/23/2019 1518   HCT 38.0 01/23/2019 1518   PLT 243 10/12/2018 1325   MCV 82 01/23/2019 1518   MCH 24.9 (L) 01/23/2019 1518   MCH 26.4 (L) 02/08/2018 1125   MCHC 30.3 (L) 01/23/2019 1518   MCHC 33.2 02/08/2018 1125   RDW 14.4 01/23/2019 1518   LYMPHSABS 2.1 01/23/2019 1518   MONOABS 0.4 07/26/2017 1028   EOSABS 0.1 01/23/2019 1518   BASOSABS 0.0 01/23/2019 1518   Iron/TIBC/Ferritin/ %Sat    Component Value Date/Time   IRON 52 12/12/2007 1500   IRONPCTSAT 14.6 (L) 12/12/2007 1500   Lipid Panel     Component Value Date/Time   CHOL 170 06/22/2019 0907   TRIG 103 06/22/2019 0907   HDL 46 06/22/2019 0907   CHOLHDL 6 01/30/2019 0932   VLDL 36.0 01/30/2019 0932   LDLCALC 103 (H) 06/22/2019 0907   LDLDIRECT 83.0 01/28/2017 1011   Hepatic Function Panel     Component Value Date/Time   PROT 6.6 06/22/2019 0907   ALBUMIN 4.5 06/22/2019 0907   AST 36 06/22/2019 0907   ALT 60 (H) 06/22/2019 0907   ALKPHOS 58 06/22/2019 0907   BILITOT 0.3 06/22/2019 0907   BILIDIR 0.0 05/02/2015 0823      Component Value Date/Time   TSH  1.830 10/12/2018 1325   TSH 2.58 04/06/2016 1408   TSH 2.86 07/16/2014 1051   I, Trixie Dredge, am acting as transcriptionist for Briscoe Deutscher, DO  I have reviewed the above documentation for accuracy and completeness, and I agree with the above. Briscoe Deutscher, DO

## 2019-11-23 ENCOUNTER — Encounter (INDEPENDENT_AMBULATORY_CARE_PROVIDER_SITE_OTHER): Payer: Self-pay | Admitting: Family Medicine

## 2019-11-23 ENCOUNTER — Other Ambulatory Visit: Payer: Self-pay

## 2019-11-23 ENCOUNTER — Ambulatory Visit (INDEPENDENT_AMBULATORY_CARE_PROVIDER_SITE_OTHER): Payer: Medicare Other | Admitting: Family Medicine

## 2019-11-23 VITALS — BP 161/89 | HR 63 | Temp 98.2°F | Ht 63.0 in | Wt 165.0 lb

## 2019-11-23 DIAGNOSIS — I10 Essential (primary) hypertension: Secondary | ICD-10-CM | POA: Diagnosis not present

## 2019-11-23 DIAGNOSIS — E669 Obesity, unspecified: Secondary | ICD-10-CM

## 2019-11-23 DIAGNOSIS — E7849 Other hyperlipidemia: Secondary | ICD-10-CM | POA: Diagnosis not present

## 2019-11-23 DIAGNOSIS — R739 Hyperglycemia, unspecified: Secondary | ICD-10-CM

## 2019-11-23 DIAGNOSIS — Z6830 Body mass index (BMI) 30.0-30.9, adult: Secondary | ICD-10-CM

## 2019-11-23 DIAGNOSIS — E559 Vitamin D deficiency, unspecified: Secondary | ICD-10-CM | POA: Diagnosis not present

## 2019-11-24 LAB — COMPREHENSIVE METABOLIC PANEL
ALT: 45 IU/L — ABNORMAL HIGH (ref 0–32)
AST: 25 IU/L (ref 0–40)
Albumin/Globulin Ratio: 1.7 (ref 1.2–2.2)
Albumin: 4.4 g/dL (ref 3.8–4.8)
Alkaline Phosphatase: 62 IU/L (ref 39–117)
BUN/Creatinine Ratio: 32 — ABNORMAL HIGH (ref 12–28)
BUN: 20 mg/dL (ref 8–27)
Bilirubin Total: 0.3 mg/dL (ref 0.0–1.2)
CO2: 27 mmol/L (ref 20–29)
Calcium: 9.8 mg/dL (ref 8.7–10.3)
Chloride: 101 mmol/L (ref 96–106)
Creatinine, Ser: 0.62 mg/dL (ref 0.57–1.00)
GFR calc Af Amer: 109 mL/min/{1.73_m2} (ref >59)
GFR calc non Af Amer: 95 mL/min/{1.73_m2} (ref >59)
Globulin, Total: 2.6 g/dL (ref 1.5–4.5)
Glucose: 93 mg/dL (ref 65–99)
Potassium: 4.3 mmol/L (ref 3.5–5.2)
Sodium: 140 mmol/L (ref 134–144)
Total Protein: 7 g/dL (ref 6.0–8.5)

## 2019-11-24 LAB — CBC WITH DIFFERENTIAL/PLATELET
Basophils Absolute: 0 10*3/uL (ref 0.0–0.2)
Basos: 0 %
EOS (ABSOLUTE): 0 10*3/uL (ref 0.0–0.4)
Eos: 1 %
Hematocrit: 41.7 % (ref 34.0–46.6)
Hemoglobin: 13.1 g/dL (ref 11.1–15.9)
Immature Grans (Abs): 0 10*3/uL (ref 0.0–0.1)
Immature Granulocytes: 0 %
Lymphocytes Absolute: 2.4 10*3/uL (ref 0.7–3.1)
Lymphs: 41 %
MCH: 24.6 pg — ABNORMAL LOW (ref 26.6–33.0)
MCHC: 31.4 g/dL — ABNORMAL LOW (ref 31.5–35.7)
MCV: 78 fL — ABNORMAL LOW (ref 79–97)
Monocytes Absolute: 0.3 10*3/uL (ref 0.1–0.9)
Monocytes: 6 %
Neutrophils Absolute: 3.1 10*3/uL (ref 1.4–7.0)
Neutrophils: 52 %
Platelets: 176 10*3/uL (ref 150–450)
RBC: 5.32 x10E6/uL — ABNORMAL HIGH (ref 3.77–5.28)
RDW: 15.2 % (ref 11.7–15.4)
WBC: 5.9 10*3/uL (ref 3.4–10.8)

## 2019-11-24 LAB — HEMOGLOBIN A1C
Est. average glucose Bld gHb Est-mCnc: 108 mg/dL
Hgb A1c MFr Bld: 5.4 % (ref 4.8–5.6)

## 2019-11-24 LAB — VITAMIN D 25 HYDROXY (VIT D DEFICIENCY, FRACTURES): Vit D, 25-Hydroxy: 86.5 ng/mL (ref 30.0–100.0)

## 2019-11-24 LAB — INSULIN, RANDOM: INSULIN: 10.3 u[IU]/mL (ref 2.6–24.9)

## 2019-11-25 ENCOUNTER — Encounter (HOSPITAL_COMMUNITY): Payer: Self-pay | Admitting: Emergency Medicine

## 2019-11-25 ENCOUNTER — Emergency Department (HOSPITAL_COMMUNITY): Payer: Medicare Other

## 2019-11-25 ENCOUNTER — Emergency Department (HOSPITAL_COMMUNITY)
Admission: EM | Admit: 2019-11-25 | Discharge: 2019-11-25 | Disposition: A | Discharge status: Home / Self Care | Payer: Medicare Other | Attending: Emergency Medicine | Admitting: Emergency Medicine

## 2019-11-25 ENCOUNTER — Other Ambulatory Visit: Payer: Self-pay

## 2019-11-25 ENCOUNTER — Other Ambulatory Visit (INDEPENDENT_AMBULATORY_CARE_PROVIDER_SITE_OTHER): Payer: Self-pay | Admitting: Family Medicine

## 2019-11-25 DIAGNOSIS — I10 Essential (primary) hypertension: Secondary | ICD-10-CM | POA: Insufficient documentation

## 2019-11-25 DIAGNOSIS — R110 Nausea: Secondary | ICD-10-CM | POA: Diagnosis not present

## 2019-11-25 DIAGNOSIS — R0789 Other chest pain: Secondary | ICD-10-CM | POA: Diagnosis not present

## 2019-11-25 DIAGNOSIS — R079 Chest pain, unspecified: Secondary | ICD-10-CM

## 2019-11-25 DIAGNOSIS — R002 Palpitations: Secondary | ICD-10-CM | POA: Insufficient documentation

## 2019-11-25 DIAGNOSIS — Z79899 Other long term (current) drug therapy: Secondary | ICD-10-CM | POA: Diagnosis not present

## 2019-11-25 DIAGNOSIS — M79651 Pain in right thigh: Secondary | ICD-10-CM | POA: Insufficient documentation

## 2019-11-25 LAB — BASIC METABOLIC PANEL
Anion gap: 11 (ref 5–15)
BUN: 19 mg/dL (ref 8–23)
CO2: 27 mmol/L (ref 22–32)
Calcium: 9.4 mg/dL (ref 8.9–10.3)
Chloride: 102 mmol/L (ref 98–111)
Creatinine, Ser: 0.78 mg/dL (ref 0.44–1.00)
GFR calc Af Amer: >60 mL/min (ref >60)
GFR calc non Af Amer: >60 mL/min (ref >60)
Glucose, Bld: 115 mg/dL — ABNORMAL HIGH (ref 70–99)
Potassium: 3.6 mmol/L (ref 3.5–5.1)
Sodium: 140 mmol/L (ref 135–145)

## 2019-11-25 LAB — CBC
HCT: 41.9 % (ref 36.0–46.0)
Hemoglobin: 13.4 g/dL (ref 12.0–15.0)
MCH: 25.5 pg — ABNORMAL LOW (ref 26.0–34.0)
MCHC: 32 g/dL (ref 30.0–36.0)
MCV: 79.8 fL — ABNORMAL LOW (ref 80.0–100.0)
Platelets: 185 10*3/uL (ref 150–400)
RBC: 5.25 MIL/uL — ABNORMAL HIGH (ref 3.87–5.11)
RDW: 15.3 % (ref 11.5–15.5)
WBC: 8.3 10*3/uL (ref 4.0–10.5)
nRBC: 0 % (ref 0.0–0.2)

## 2019-11-25 LAB — TROPONIN I (HIGH SENSITIVITY)
Troponin I (High Sensitivity): 5 ng/L (ref <18)
Troponin I (High Sensitivity): 8 ng/L (ref <18)

## 2019-11-25 MED ORDER — SODIUM CHLORIDE 0.9% FLUSH: 3.0000 mL | Freq: Once | INTRAVENOUS | Status: DC

## 2019-11-25 NOTE — ED Provider Notes (Signed)
[facility] DEPARTMENT Provider Note   CSN: [id number] Arrival date & time: 11/25/19  1607     History Chief Complaint  Patient presents with  . Chest Pain    Kristin Delgado is a 64 y.o. female.  HPI      Presents with concern for chest pain pain CP on left side, heart racing, wave of nausea Early this AM while out for a walk, had pain inside of right thigh that lasted 30 minutes, sat down and got better after was walking, no other leg pain.   Chest pain started after lunch, has been coming and going, 39min-hr then coming and going since then.  Not having it right now just feels tense, when it first started it felt like breast pain and then spread to shoulder, sharp pain for about 30 minutes, at that time had the heart racing, nausea. No dyspnea.  No leg swelling.   CP not pleuritic, positional.  Took deep breaths which helped heart racing slow down, felt less tense.  No exertional chest pain. Mild pain now was severe earlier.  No abdominal pain No hx of prior heart racing or palpitations  No smoking, etoh or other drug use  Past Medical History:  Diagnosis Date  . Allergy    seasonal  . Anxiety    closed space like MRI  . Back pain \  . Chest pain 2006   admitted, essentially negative  . Constipation   . Depression   . GERD (gastroesophageal reflux disease)   . Hyperlipidemia   . Hypertension   . Infertility, female   . Joint pain   . Pituitary microadenoma ([place])   . Swallowing difficulty   . 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 Vitamin D deficiency      Patient Active Problem List   Diagnosis Date Noted  . At risk for heart disease 11/01/2019  . Vitamin D deficiency 11/01/2019  . Insulin resistance 09/11/2019  . Class 1 obesity with serious comorbidity and body mass index (BMI) of 30.0 to 30.9 in adult 09/11/2019  . Pituitary adenoma ([place]) 01/30/2019  . Arthritis of both hands 08/01/2018  . Hyperlipidemia 07/15/2013  . Sensorineural  hearing loss, unilateral 03/07/2013  . Cardiac murmur 06/14/2012  . Depression with anxiety 02/12/2011  . Essential hypertension 03/08/2007    Past Surgical History:  Procedure Laterality Date  . 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 BRAIN SURGERY  1 / 1985   microadenoma  . COCHLEAR IMPLANT  02/2013   Left side     OB History    Gravida  3   Para  3   Term      Preterm      AB      Living        SAB      TAB      Ectopic      Multiple      Live Births              Family History  Problem Relation Age of Onset  . Lung cancer Mother   . Cancer Mother 79       lung   . Alzheimer's disease Father   . Dementia Father     Social History   Tobacco Use  . Smoking status: Never Smoker  . Smokeless tobacco: Never Used  Substance Use Topics  . Alcohol use: No    Comment: rare  . Drug use: No  Home Medications Prior to Admission medications   Medication Sig Start Date End Date Taking? Authorizing Provider  acetaminophen (TYLENOL ARTHRITIS PAIN) 650 MG CR tablet Take 650 mg by mouth every 8 (eight) hours as needed. pain     [provider]  Ascorbic Acid (VITAMIN C) 1000 MG tablet Take 1,000 mg by mouth 2 (two) times daily.      [provider]  atorvastatin (LIPITOR) 20 MG tablet Take 1 tablet (20 mg total) by mouth daily. 08/08/19   Roma Schanz R, DO  b complex vitamins tablet Take 1 tablet by mouth daily.    [provider]  bromocriptine (PARLODEL) 2.5 MG tablet Take 1 tablet (2.5 mg total) by mouth daily. 08/08/19   [name], DO  Calcium Acetate, Phos Binder, (CALCIUM ACETATE PO) Take by mouth.    [provider]  Calcium Polycarbophil (FIBER-CAPS PO) Take by mouth.    [provider]  cholecalciferol (VITAMIN D3) 25 MCG (1000 UT) tablet Take 5,000 Units by mouth daily.    [provider]  ferrous sulfate 325 (65 FE) MG EC tablet Take 325 mg by mouth 3 (three) times daily  with meals.    [provider]  hydrochlorothiazide (HYDRODIURIL) 12.5 MG tablet Take 1 tablet (12.5 mg total) by mouth daily. 09/26/19   Whitmire, Dawn W, FNP  lisinopril (ZESTRIL) 10 MG tablet Take 0.5 tablets (5 mg total) by mouth daily. 08/21/19   Eber Jones, MD  Multiple Vitamin (MULTIVITAMIN) tablet Take 1 tablet by mouth daily.      [provider]  Omega 3-6-9 Fatty Acids (OMEGA 3-6-9 COMPLEX PO) Take by mouth.    [provider]  Omega-3 Fatty Acids (FISH OIL) 1200 MG CAPS Take 1 capsule by mouth 2 (two) times daily.    [provider]  Probiotic Product (PROBIOTIC ADVANCED PO) Take by mouth.    [provider]  ST JOHNS WORT PO Take by mouth.    [provider]    Allergies    Patient has no known allergies.  Review of Systems   Review of Systems  Constitutional: Negative for fever.  HENT: Negative for congestion and sore throat. Rhinorrhea: chronic.   Respiratory: Negative for cough and shortness of breath.   Cardiovascular: Positive for chest pain and palpitations. Negative for leg swelling.  Gastrointestinal: Positive for nausea. Negative for abdominal pain, diarrhea and vomiting.  Musculoskeletal: Negative for back pain (chronic unchanged).  Skin: Negative for rash.  Neurological: Negative for light-headedness and headaches.    Physical Exam Updated Vital Signs BP 136/60   Pulse 77   Temp 99.2 F (37.3 C) (Oral)   Resp 16   SpO2 96%   Physical Exam Vitals and nursing note reviewed.  Constitutional:      General: She is not in acute distress.    Appearance: She is well-developed. She is not diaphoretic.  HENT:     Head: Normocephalic and atraumatic.  Eyes:     Conjunctiva/sclera: Conjunctivae normal.  Cardiovascular:     Rate and Rhythm: Normal rate and regular rhythm.     Heart sounds: Normal heart sounds. No murmur. No friction rub. No gallop.   Pulmonary:     Effort: Pulmonary effort is  normal. No respiratory distress.     Breath sounds: Normal breath sounds. No wheezing or rales.  Abdominal:     General: There is no distension.     Palpations: Abdomen is soft.  Tenderness: There is no abdominal tenderness. There is no guarding.  Musculoskeletal:        General: No tenderness.     Cervical back: Normal range of motion.  Skin:    General: Skin is warm and dry.     Findings: No erythema or rash.  Neurological:     Mental Status: She is alert and oriented to person, place, and time.     ED Results / Procedures / Treatments   Labs (all labs ordered are listed, but only abnormal results are displayed) Labs Reviewed  BASIC METABOLIC PANEL - Abnormal; Notable for the following components:      Result Value   Glucose, Bld 115 (*)    All other components within normal limits  CBC - Abnormal; Notable for the following components:   RBC 5.25 (*)    MCV 79.8 (*)    MCH 25.5 (*)    All other components within normal limits  TROPONIN I (HIGH SENSITIVITY)  TROPONIN I (HIGH SENSITIVITY)    EKG EKG Interpretation  Date/Time:  Saturday November 25 2019 18:01:15 EST Ventricular Rate:  82 PR Interval:  174 QRS Duration: 92 QT Interval:  378 QTC Calculation: 442 R Axis:   56 Text Interpretation: Sinus rhythm Anterior infarct, old No significant change since prior today Confirmed by Aletta Edouard [phone]) on 11/26/2019 1:07:12 PM   Radiology DG Chest 2 View  Result Date: 11/25/2019 CLINICAL DATA:  Chest pain. EXAM: CHEST - 2 VIEW COMPARISON:  Apr 07, 2005 FINDINGS: The heart size and mediastinal contours are within normal limits. Both lungs are clear. The visualized skeletal structures are unremarkable. IMPRESSION: No active cardiopulmonary disease. Electronically Signed   By: Dorise Bullion III M.D   On: 11/25/2019 17:14    Procedures Procedures (including critical care time)  Medications Ordered in ED Medications - No data to display  ED Course  I have  reviewed the triage vital signs and the nursing notes.  Pertinent labs & imaging results that were available during my care of the patient were reviewed by me and considered in my medical decision making (see chart for details).    MDM Rules/Calculators/A&P                      64yo female with history of hypertension, hyperlipidemia, insulin resistance who presents with concern for chest pain. Differential diagnosis for chest pain includes pulmonary embolus, dissection, pneumothorax, pneumonia, ACS, myocarditis, pericarditis.  EKG was done and evaluate by me and showed no acute ST changes and no signs of pericarditis. Chest x-ray was done and evaluated by me and radiology and showed no sign of pneumonia or pneumothorax.  Delta troponins were 5 and 8 both negative without significant change.  Normal bilateral upper and lower extremity pulses, do not fee history is consistent with aortic dissection.  Doubt PE given no dyspnea, no asymmetric leg swelling, normal oxygenation, no tachycardia.  Brief groin pain unlikely to represent DVT given brief episode while walking, no continuing pain, no sign of abnormality on exam.   Given this evaluation, history and physical have low suspicion for pulmonary embolus, pneumonia, ACS, myocarditis, pericarditis, dissection.  Pt with Cardiac risk factors, however some atypical symptoms and describing palpitations/heart racing at time she was experiencing symptoms. Feels improved in the ED.  In the setting of the pandemic given normal troponins and atypical features feel close outpatient follow up with Cardiology is most appropriate with consideration for close outpt stress testing and  consider holter monitoring given palpitations.  Discussed strict return precautions. Patient discharged in stable condition with understanding of reasons to return.     Final Clinical Impression(s) / ED Diagnoses Final diagnoses:  Chest pain, unspecified type    Rx / DC Orders ED  Discharge Orders    None       Gareth Morgan, MD 11/27/19 0001

## 2019-11-25 NOTE — ED Triage Notes (Signed)
C/o pain to L side of chest with nausea that started just after lunch.  Pain currently 1/10.  Denies SOB.

## 2019-11-25 NOTE — ED Notes (Signed)
Pt ambulatory to bathroom without any problems 

## 2019-11-25 NOTE — Progress Notes (Signed)
Chief Complaint:   OBESITY Kristin Delgado is here to discuss her progress with her obesity treatment plan along with follow-up of her obesity related diagnoses. [name] is keeping a food journal and adhering to recommended goals of 1250 calories and 94 grams of protein and states she is following her eating plan approximately 50% of the time. [name] states she is walking 2/3 mile 7 times per week.  Today's visit was #: 24 Starting weight: 189 lbs Starting date: 10/12/2018 Today's weight: 165 lbs Today's date: 11/23/2019 Total lbs lost to date: 24 lbs Total lbs lost since last in-office visit: 0  Interim History: [name] states she was off the plan over the holiday.  She is ready to get back to writing in her journal.  She is now fully retired.  Subjective:   1. Essential hypertension Kristin Delgado is taking her medications as instructed with no medication side effects noted.  Blood pressure in her right arm is consistently 20 points higher than in her left.  She takes lisinopril-HCTZ.  BP Readings from Last 3 Encounters:  11/25/19 136/60  11/23/19 (!) 161/89  10/17/19 (!) 154/81   2. Vitamin D deficiency She's Vitamin D level was 52.5 on 01/23/2019. She is currently taking vit D 5000 IU daily. She denies nausea, vomiting or muscle weakness.  3. Other hyperlipidemia Kristin Delgado has hyperlipidemia and has been trying to improve her cholesterol levels with intensive lifestyle modification including a low saturated fat diet, exercise and weight loss. She denies any chest pain, claudication or myalgias.  She is taking a statin for her hyperlipidemia.  She is not fasting today.  Lab Results  Component Value Date   ALT 45 (H) 11/23/2019   AST 25 11/23/2019   ALKPHOS 62 11/23/2019   BILITOT 0.3 11/23/2019   Lab Results  Component Value Date   CHOL 170 06/22/2019   HDL 46 06/22/2019   LDLCALC 103 (H) 06/22/2019   LDLDIRECT 83.0 01/28/2017   TRIG 103 06/22/2019   CHOLHDL 6 01/30/2019    4. Hyperglycemia Kristin Delgado has a history of some elevated blood glucose readings without a diagnosis of diabetes.  Last A1c was 5.2 on 06/22/2019.  Assessment/Plan:   1. Essential hypertension [name] is working on healthy weight loss and exercise to improve blood pressure control. We will watch for signs of hypotension as she continues her lifestyle modifications.  Orders - Ambulatory referral to Cardiology - Comprehensive Metabolic Panel (CMET)  2. Vitamin D deficiency Low Vitamin D level contributes to fatigue and are associated with obesity, breast, and colon cancer. She agrees to continue to take Vitamin D @5 ,000 IU daily and will follow-up for routine testing of vitamin D, at least 2-3 times per year to avoid over-replacement.  Orders - Vitamin D (25 hydroxy)  3. Other hyperlipidemia Cardiovascular risk and specific lipid/LDL goals reviewed.  We discussed several lifestyle modifications today and [name] will continue to work on diet, exercise and weight loss efforts. Orders and follow up as documented in patient record.  Will check lipid panel at next visit.  Patient needs to fast.  Counseling Intensive lifestyle modifications are the first line treatment for this issue. . Dietary changes: Increase soluble fiber. Decrease simple carbohydrates. . Exercise changes: Moderate to vigorous-intensity aerobic activity 150 minutes per week if tolerated. . Lipid-lowering medications: see documented in medical record.  Orders - Comprehensive Metabolic Panel (CMET)  4. Hyperglycemia Fasting labs will be obtained and results with be discussed with [name] in 2 weeks at her  follow up visit. In the meanwhile Ily was started on a lower simple carbohydrate diet and will work on weight loss efforts.  Orders - CBC w/Diff/Platelet - Comprehensive Metabolic Panel (CMET) - HgB A1c - Insulin, random  5. Class 1 obesity with serious comorbidity and body mass index (BMI) of 30.0 to 30.9 in  adult, unspecified obesity type Kristin Delgado is currently in the action stage of change. As such, her goal is to continue with weight loss efforts. She has agreed to keeping a food journal and adhering to recommended goals of 1250 calories and 94 grams of protein.   We discussed the following exercise goals today: Older adults should follow the adult guidelines. When older adults cannot meet the adult guidelines, they should be as physically active as their abilities and conditions will allow.  Older adults should do exercises that maintain or improve balance if they are at risk of falling.   We discussed the following behavioral modification strategies today: planning for success.  Kristin Delgado [place] has agreed to follow-up with our clinic in 2 weeks. She was informed of the importance of frequent follow-up visits to maximize her success with intensive lifestyle modifications for her multiple health conditions.   Kristin Delgado was informed we would discuss her lab results at her next visit unless there is a critical issue that needs to be addressed sooner. Kristin Delgado agreed to keep her next visit at the agreed upon time to discuss these results.  Objective:   Blood pressure (!) 161/89, pulse 63, temperature 98.2 F (36.8 C), temperature source Oral, height 5\' 3"  (1.6 m), weight 165 lb (74.8 kg), SpO2 95 %. Body mass index is 29.23 kg/m.  General: Cooperative, alert, well developed, in no acute distress. HEENT: Conjunctivae and lids unremarkable. Neck: No thyromegaly.  Cardiovascular: Regular rhythm.  Lungs: Normal work of breathing. Extremities: No edema.  Neurologic: No focal deficits.   Lab Results  Component Value Date   CREATININE 0.62 11/23/2019   BUN 20 11/23/2019   NA 140 11/23/2019   K 4.3 11/23/2019   CL 101 11/23/2019   CO2 27 11/23/2019   Lab Results  Component Value Date   ALT 45 (H) 11/23/2019   AST 25 11/23/2019   ALKPHOS 62 11/23/2019   BILITOT 0.3 11/23/2019   Lab Results    Component Value Date   HGBA1C 5.4 11/23/2019   HGBA1C 5.2 06/22/2019   HGBA1C 5.3 01/23/2019   HGBA1C 5.6 10/12/2018   HGBA1C 6.1 01/28/2017   Lab Results  Component Value Date   INSULIN 10.3 11/23/2019   INSULIN 6.7 06/22/2019   INSULIN 6.2 01/23/2019   INSULIN 5.9 10/12/2018   Lab Results  Component Value Date   TSH 1.830 10/12/2018   Lab Results  Component Value Date   CHOL 170 06/22/2019   HDL 46 06/22/2019   LDLCALC 103 (H) 06/22/2019   LDLDIRECT 83.0 01/28/2017   TRIG 103 06/22/2019   CHOLHDL 6 01/30/2019   Lab Results  Component Value Date   WBC 5.9 11/23/2019   HGB 13.1 11/23/2019   HCT 41.7 11/23/2019   MCV 78 (L) 11/23/2019   PLT 176 11/23/2019   Lab Results  Component Value Date   IRON 52 12/12/2007    Obesity Behavioral Intervention Documentation for Insurance:   Approximately 15 minutes were spent on the discussion below.  ASK: We discussed the diagnosis of obesity with Veverly Fells today and Jeidi agreed to give [name] permission to discuss obesity behavioral modification therapy today.  ASSESS: [name]  has the diagnosis of obesity and her BMI today is 29.2. Kaysi is in the action stage of change.   ADVISE: Aranda was educated on the multiple health risks of obesity as well as the benefit of weight loss to improve her health. She was advised of the need for long term treatment and the importance of lifestyle modifications to improve her current health and to decrease her risk of future health problems.  AGREE: Multiple dietary modification options and treatment options were discussed and Kele agreed to follow the recommendations documented in the above note.  ARRANGE: Shavaun was educated on the importance of frequent visits to treat obesity as outlined per CMS and USPSTF guidelines and agreed to schedule her next follow up appointment today.  Attestation Statements:   Reviewed by clinician on day of visit: allergies, medications,  problem list, medical history, surgical history, family history, social history, and previous encounter notes.  This visit occurred during the SARS-CoV-2 public health emergency. Safety protocols were in place, including screening questions prior to the visit, additional usage of staff PPE, and extensive cleaning of exam room while observing appropriate contact time as indicated for disinfecting solutions. (CPT [id number])  I, Water quality scientist, CMA, am acting as transcriptionist for [employer], DO.  I have reviewed the above documentation for accuracy and completeness, and I agree with the above. Briscoe Deutscher, DO

## 2019-11-27 MED ORDER — LISINOPRIL 10 MG PO TABS: 5.0000 mg | ORAL_TABLET | Freq: Every day | ORAL | 0 refills | Status: DC

## 2019-11-28 ENCOUNTER — Encounter (INDEPENDENT_AMBULATORY_CARE_PROVIDER_SITE_OTHER): Payer: Self-pay | Admitting: Family Medicine

## 2019-11-30 NOTE — Progress Notes (Signed)
Cardiology Office Note:   Date:  12/01/2019  NAME:  Kristin Delgado    MRN: [id number] DOB:  22-Feb-1956   PCP:  [name], DO  Cardiologist:  No primary care provider on file.  Electrophysiologist:  None   Referring MD: Briscoe Deutscher, DO   Chief Complaint  Patient presents with  . Chest Pain   History of Present Illness:   Kristin Delgado is a 64 y.o. female with a hx of hypertension who is being seen today for the evaluation of chest pain at the request of Briscoe Deutscher, DO.  Evaluated in the emergency room on 11/25/2019 for atypical chest pain.  Cardiac enzymes negative for ACS.  EKG unremarkable.  Evaluation today.  She reports she was evaluated in the emergency room on Saturday.  Had an episode of left-sided chest pain.  The pain reported as sharp radiating into the arm.  Lasted several hours.  Resolve without any intervention.  Apparently this also started without any identifiable trigger.  She reports she was eating lunch and the pain simply started after that.  She reports no prior episodes of this.  Associated symptoms included palpitations.  She denied any shortness of breath.  She also reports that she has been working with a weight loss specialist and has noted to have high blood pressure over the past few visits.  She did not take her blood pressure medications today and her blood pressure is 151/83.  Nonetheless she reports no lightheadedness dizziness or blurry vision.  She had an echocardiogram in 2013 and was noted to have a dilated ascending aorta up to 4.4 cm.  Apparently had no follow-up of this since that time.  He has had no further episodes of chest pain.  She does walk up to 1 mile per day and has no limitations doing that.  She has no episodes of chest pain or shortness of breath with activity.  She is a never smoker.  No strong family history of cardiovascular disease.  She is never had a heart attack or stroke.  Seems to be doing well otherwise.  Past Medical  History: Past Medical History:  Diagnosis Date  . Allergy    seasonal  . Anxiety    closed space like MRI  . Back pain \  . Chest pain 2006   admitted, essentially negative  . Constipation   . Depression   . GERD (gastroesophageal reflux disease)   . Hyperlipidemia   . Hypertension   . Infertility, female   . Joint pain   . Pituitary microadenoma ([place])   . Swallowing difficulty   . 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 Vitamin D deficiency     Past Surgical History: Past Surgical History:  Procedure Laterality Date  . 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 BRAIN SURGERY  1 / 1985   microadenoma  . COCHLEAR IMPLANT  02/2013   Left side    Current Medications: Current Meds  Medication Sig  . acetaminophen (TYLENOL ARTHRITIS PAIN) 650 MG CR tablet Take 650 mg by mouth every 8 (eight) hours as needed. pain   . Ascorbic Acid (VITAMIN C) 1000 MG tablet Take 1,000 mg by mouth 2 (two) times daily.    [name] atorvastatin (LIPITOR) 20 MG tablet Take 1 tablet (20 mg total) by mouth daily.  [name] b complex vitamins tablet Take 1 tablet by mouth daily.  . bromocriptine (PARLODEL) 2.5 MG tablet Take 1 tablet (2.5 mg total) by mouth daily.  [name]  Calcium Acetate, Phos Binder, (CALCIUM ACETATE PO) Take by mouth.  . Calcium Polycarbophil (FIBER-CAPS PO) Take by mouth.  . cholecalciferol (VITAMIN D3) 25 MCG (1000 UT) tablet Take 5,000 Units by mouth daily.  . ferrous sulfate 325 (65 FE) MG EC tablet Take 325 mg by mouth 3 (three) times daily with meals.  . hydrochlorothiazide (HYDRODIURIL) 12.5 MG tablet Take 1 tablet (12.5 mg total) by mouth daily.  [name] lisinopril (ZESTRIL) 10 MG tablet Take 0.5 tablets (5 mg total) by mouth daily.  . Multiple Vitamin (MULTIVITAMIN) tablet Take 1 tablet by mouth daily.    . Omega 3-6-9 Fatty Acids (OMEGA 3-6-9 COMPLEX PO) Take by mouth.  . Omega-3 Fatty Acids (FISH OIL) 1200 MG CAPS Take 1 capsule by mouth 2 (two) times daily.  . Probiotic Product (PROBIOTIC ADVANCED PO)  Take by mouth.  . ST JOHNS WORT PO Take by mouth.  . [DISCONTINUED] hydrochlorothiazide (HYDRODIURIL) 12.5 MG tablet Take 1 tablet (12.5 mg total) by mouth daily.     Allergies:    Patient has no known allergies.   Social History: Social History   Socioeconomic History  . Marital status: Married    Spouse name: Annie Main  . Number of children: 3  . Years of education: Not on file  . Highest education level: Not on file  Occupational History  . 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 Smoking status: Never Smoker  . Smokeless tobacco: Never Used  Substance and Sexual Activity  . Alcohol use: No    Comment: rare  . Drug use: No  . Sexual activity: Not Currently    Partners: Male  Other Topics Concern  . Not on file  Social History Narrative   Exercise-- daily --- walking, 3x a week DVD   Social Determinants of Health   Financial Resource Strain:   . Difficulty of Paying Living Expenses: Not on file  Food Insecurity:   . Worried About Charity fundraiser in the Last Year: Not on file  . Ran Out of Food in the Last Year: Not on file  Transportation Needs:   . Lack of Transportation (Medical): Not on file  . Lack of Transportation (Non-Medical): Not on file  Physical Activity:   . Days of Exercise per Week: Not on file  . Minutes of Exercise per Session: Not on file  Stress:   . Feeling of Stress : Not on file  Social Connections:   . Frequency of Communication with Friends and Family: Not on file  . Frequency of Social Gatherings with Friends and Family: Not on file  . Attends Religious Services: Not on file  . Active Member of Clubs or Organizations: Not on file  . Attends Archivist Meetings: Not on file  . Marital Status: Not on file     Family History: The patient's family history includes Alzheimer's disease in her father; Cancer (age of onset: 33) in her mother; Dementia in her father; Lung cancer in her mother.  ROS:   All other ROS reviewed and  negative. Pertinent positives noted in the HPI.     EKGs/Labs/Other Studies Reviewed:   The following studies were personally reviewed by me today:  EKG:  EKG is ordered today.  The ekg ordered today demonstrates normal sinus rhythm, Q waves across precordium concerning for anterior infarct versus obesity related artifact, no acute ST-T changes, and was personally reviewed by me.   TTE 06/20/2012 - Left ventricle: The cavity size was normal. Wall thickness  was increased in a pattern of mild LVH. The estimated  ejection fraction was 60%. Wall motion was normal; there  were no regional wall motion abnormalities.  - Aortic valve: Difficult to see, but I think there are  three cusps. Mild regurgitation.  - Aorta: The root at the valve and sinuses of valsalva is of  normal size. However , the ascending aorta is dilated at  57mm.  - Mitral valve: Mild regurgitation.  - Left atrium: The atrium was mildly dilated.  Transthoracic echocardiography. M-mode, complete 2D,  spectral Doppler, and color Doppler. Height: Height:  165.1cm. Height: 65in. Weight: Weight: 91.6kg. Weight:  201.6lb. Body mass index: BMI: 33.6kg/m^2. Body surface  area:  BSA: 1.22m^2. Blood pressure:   132/76. Patient  status: Outpatient. Location: [facility]   Recent Labs: 11/23/2019: ALT 45 11/25/2019: BUN 19; Creatinine, Ser 0.78; Hemoglobin 13.4; Platelets 185; Potassium 3.6; Sodium 140   Recent Lipid Panel    Component Value Date/Time   CHOL 170 06/22/2019 0907   TRIG 103 06/22/2019 0907   HDL 46 06/22/2019 0907   CHOLHDL 6 01/30/2019 0932   VLDL 36.0 01/30/2019 0932   LDLCALC 103 (H) 06/22/2019 0907   LDLDIRECT 83.0 01/28/2017 1011    Physical Exam:   VS:  BP (!) 151/83   Pulse 62   Temp 98.5 F (36.9 C)   Ht 5\' 3"  (1.6 m)   Wt 168 lb 9.6 oz (76.5 kg)   SpO2 97%   BMI 29.87 kg/m    Wt Readings from Last 3 Encounters:  12/01/19 168 lb 9.6 oz (76.5 kg)  11/23/19 165 lb  (74.8 kg)  10/17/19 165 lb (74.8 kg)    General: Well nourished, well developed, in no acute distress Heart: Atraumatic, normal size  Eyes: PEERLA, EOMI  Neck: Supple, no JVD Endocrine: No thryomegaly Cardiac: Normal S1, S2; RRR; no murmurs, rubs, or gallops Lungs: Clear to auscultation bilaterally, no wheezing, rhonchi or rales  Abd: Soft, nontender, no hepatomegaly  Ext: No edema, pulses 2+ Musculoskeletal: No deformities, BUE and BLE strength normal and equal Skin: Warm and dry, no rashes   Neuro: Alert and oriented to person, place, time, and situation, CNII-XII grossly intact, no focal deficits  Psych: Normal mood and affect   ASSESSMENT:   Brityn Ottaviani is a 64 y.o. female who presents for the following: 1. Other chest pain   2. Essential hypertension   3. Murmur   4. Precordial pain   5. Ascending aortic aneurysm (HCC)     PLAN:   1. Other chest pain -Atypical chest pain.  She does have CVD risk factors including hypertension and obesity.  I think it is best to proceed with a TSH today as well as plan for a cardiac CTA to exclude obstructive CAD.  We will also plan to scan her entire thoracic aorta to get a good look at what was reported as a 4.4 cm aorta and 2013.  I was not able to review that echocardiogram but we will go ahead and repeat a new echocardiogram.  We will give her 100 mg of metoprolol tartrate 2 hours before scan.  She will obtain a BMP 1 week before the scan as well.  Overall I have a low suspicion for CAD but we will excluded with the above work-up.  I am more concerned about the possible ascending aortic aneurysm that was never evaluated in 2013.  2. Essential hypertension -A bit elevated today.  Did not take medications.  Informed her when she comes back she will take her medications.  3. Murmur -She has a noticeable click of S2 on examination.  She was reported to have a tricuspid aortic valve but I think we need to revisit this with an echocardiogram as  well as a cardiac CTA with good look at the ascending aorta as above.  4. Precordial pain -Cardiac CT as above.   Disposition: Return in about 3 months (around 02/29/2020).  Medication Adjustments/Labs and Tests Ordered: Current medicines are reviewed at length with the patient today.  Concerns regarding medicines are outlined above.  Orders Placed This Encounter  Procedures  . CT CORONARY MORPH W/CTA COR W/SCORE W/CA W/CM &/OR WO/CM  . CT CORONARY FRACTIONAL FLOW RESERVE DATA PREP  . CT CORONARY FRACTIONAL FLOW RESERVE FLUID ANALYSIS  . TSH  . Basic metabolic panel  . EKG 12-Lead  . ECHOCARDIOGRAM COMPLETE   Meds ordered this encounter  Medications  . metoprolol tartrate (LOPRESSOR) 100 MG tablet    Sig: Take ONE tablet TWO HOURS prior to test.    Dispense:  1 tablet    Refill:  0  . hydrochlorothiazide (HYDRODIURIL) 12.5 MG tablet    Sig: Take 1 tablet (12.5 mg total) by mouth daily.    Dispense:  30 tablet    Refill:  0    Patient Instructions  Medication Instructions:  NO CHANGES  You will take a one time dose of metoprolol tartrate 2 hours before CT test  *If you need a refill on your cardiac medications before your next appointment, please call your pharmacy*  Lab Work: TSH today BMET one week before CT test If you have labs (blood work) drawn today and your tests are completely normal, you will receive your results only by: [name] MyChart Message (if you have MyChart) OR . A paper copy in the mail If you have any lab test that is abnormal or we need to change your treatment, we will call you to review the results.  Testing/Procedures: Your physician has requested that you have an echocardiogram. Echocardiography is a painless test that uses sound waves to create images of your heart. It provides your doctor with information about the size and shape of your heart and how well your heart's chambers and valves are working. This procedure takes approximately one hour.  There are no restrictions for this procedure. -- done at [address] physician has ordered a coronary CT study. This is done at [facility]. Instructions are below. This will be pre-approved with insurance and then you will get a call to schedule the test.   Follow-Up: At [facility], you and your health needs are our priority.  As part of our continuing mission to provide you with exceptional heart care, we have created designated Provider Care Teams.  These Care Teams include your primary Cardiologist (physician) and Advanced Practice Providers (APPs -  Physician Assistants and Nurse Practitioners) who all work together to provide you with the care you need, when you need it.  Your next appointment:   3 month(s)  The format for your next appointment:   In Person  Provider:   Eleonore Chiquito, MD  Other Instructions  Please take BP medications before you come to next appointment with MD   CORONARY CT INSTRUCTIONS  Your cardiac CT will be scheduled at one of the below locations:   [facility] [address] [place], [place] [address] [phone]  If scheduled at  [facility], please arrive at the [facility] main entrance of [facility] 30-45 minutes prior to test start time. Proceed to the [facility] Radiology Department (first floor) to check-in and test prep.   Please follow these instructions carefully (unless otherwise directed):   On the Night Before the Test: . Be sure to Drink plenty of water. . Do not consume any caffeinated/decaffeinated beverages or chocolate 12 hours prior to your test. . Do not take any antihistamines 12 hours prior to your test.  On the Day of the Test: . Drink plenty of water. Do not drink any water within one hour of the test. . Do not eat any food 4 hours prior to the test. . You may take your regular medications prior to the test.  . Take metoprolol (Lopressor) two hours prior to test. .  HOLD Hydrochlorothiazide morning of the test. . FEMALES- please wear underwire-free bra if available   After the Test: . Drink plenty of water. . After receiving IV contrast, you may experience a mild flushed feeling. This is normal. . On occasion, you may experience a mild rash up to 24 hours after the test. This is not dangerous. If this occurs, you can take Benadryl 25 mg and increase your fluid intake. . If you experience trouble breathing, this can be serious. If it is severe call 911 IMMEDIATELY. If it is mild, please call our office. . If you take any of these medications: Glipizide/Metformin, Avandament, Glucavance, please do not take 48 hours after completing test unless otherwise instructed.   Once we have confirmed authorization from your insurance company, we will call you to set up a date and time for your test.   For non-scheduling related questions, please contact the cardiac imaging nurse navigator should you have any questions/concerns: Marchia Bond, RN Navigator Cardiac Imaging [facility] [phone] Office        Signed, Addison Naegeli. Audie Box, [place]  [address], [place], [place] [address] [phone]  12/01/2019 3:48 PM

## 2019-12-01 ENCOUNTER — Encounter: Payer: Self-pay | Admitting: Cardiovascular Disease

## 2019-12-01 ENCOUNTER — Ambulatory Visit (INDEPENDENT_AMBULATORY_CARE_PROVIDER_SITE_OTHER): Payer: Medicare Other | Admitting: Cardiovascular Disease

## 2019-12-01 ENCOUNTER — Other Ambulatory Visit: Payer: Self-pay

## 2019-12-01 VITALS — BP 151/83 | HR 62 | Temp 98.5°F | Ht 63.0 in | Wt 168.6 lb

## 2019-12-01 DIAGNOSIS — I712 Thoracic aortic aneurysm, without rupture: Secondary | ICD-10-CM | POA: Diagnosis not present

## 2019-12-01 DIAGNOSIS — I10 Essential (primary) hypertension: Secondary | ICD-10-CM | POA: Diagnosis not present

## 2019-12-01 DIAGNOSIS — R0789 Other chest pain: Secondary | ICD-10-CM

## 2019-12-01 DIAGNOSIS — R011 Cardiac murmur, unspecified: Secondary | ICD-10-CM

## 2019-12-01 DIAGNOSIS — R072 Precordial pain: Secondary | ICD-10-CM | POA: Diagnosis not present

## 2019-12-01 DIAGNOSIS — I7121 Aneurysm of the ascending aorta, without rupture: Secondary | ICD-10-CM

## 2019-12-01 MED ORDER — METOPROLOL TARTRATE 100 MG PO TABS: ORAL_TABLET | ORAL | 0 refills | Status: DC

## 2019-12-01 MED ORDER — HYDROCHLOROTHIAZIDE 12.5 MG PO TABS: 12.5000 mg | ORAL_TABLET | Freq: Every day | ORAL | 0 refills | Status: DC

## 2019-12-01 NOTE — Patient Instructions (Addendum)
Medication Instructions:  NO CHANGES  You will take a one time dose of metoprolol tartrate 2 hours before CT test  *If you need a refill on your cardiac medications before your next appointment, please call your pharmacy*  Lab Work: TSH today BMET one week before CT test If you have labs (blood work) drawn today and your tests are completely normal, you will receive your results only by: [name] MyChart Message (if you have MyChart) OR . A paper copy in the mail If you have any lab test that is abnormal or we need to change your treatment, we will call you to review the results.  Testing/Procedures: Your physician has requested that you have an echocardiogram. Echocardiography is a painless test that uses sound waves to create images of your heart. It provides your doctor with information about the size and shape of your heart and how well your heart's chambers and valves are working. This procedure takes approximately one hour. There are no restrictions for this procedure. -- done at [address] physician has ordered a coronary CT study. This is done at [facility]. Instructions are below. This will be pre-approved with insurance and then you will get a call to schedule the test.   Follow-Up: At [facility], you and your health needs are our priority.  As part of our continuing mission to provide you with exceptional heart care, we have created designated Provider Care Teams.  These Care Teams include your primary Cardiologist (physician) and Advanced Practice Providers (APPs -  Physician Assistants and Nurse Practitioners) who all work together to provide you with the care you need, when you need it.  Your next appointment:   3 month(s)  The format for your next appointment:   In Person  Provider:   Eleonore Chiquito, MD  Other Instructions  Please take BP medications before you come to next appointment with MD   CORONARY CT INSTRUCTIONS  Your cardiac CT will be  scheduled at one of the below locations:   [facility] [address] [place], [place] [address] [phone]  If scheduled at [facility], please arrive at the [facility] main entrance of [facility] 30-45 minutes prior to test start time. Proceed to the [facility] Radiology Department (first floor) to check-in and test prep.   Please follow these instructions carefully (unless otherwise directed):   On the Night Before the Test: . Be sure to Drink plenty of water. . Do not consume any caffeinated/decaffeinated beverages or chocolate 12 hours prior to your test. . Do not take any antihistamines 12 hours prior to your test.  On the Day of the Test: . Drink plenty of water. Do not drink any water within one hour of the test. . Do not eat any food 4 hours prior to the test. . You may take your regular medications prior to the test.  . Take metoprolol (Lopressor) two hours prior to test. . HOLD Hydrochlorothiazide morning of the test. . FEMALES- please wear underwire-free bra if available   After the Test: . Drink plenty of water. . After receiving IV contrast, you may experience a mild flushed feeling. This is normal. . On occasion, you may experience a mild rash up to 24 hours after the test. This is not dangerous. If this occurs, you can take Benadryl 25 mg and increase your fluid intake. . If you experience trouble breathing, this can be serious. If it is severe call  911 IMMEDIATELY. If it is mild, please call our office. . If you take any of these medications: Glipizide/Metformin, Avandament, Glucavance, please do not take 48 hours after completing test unless otherwise instructed.   Once we have confirmed authorization from your insurance company, we will call you to set up a date and time for your test.   For non-scheduling related questions, please contact the cardiac imaging nurse navigator should you have any questions/concerns: Marchia Bond, RN Navigator Cardiac Imaging [facility] [phone] Office

## 2019-12-02 LAB — TSH: TSH: 2.34 u[IU]/mL (ref 0.450–4.500)

## 2019-12-07 ENCOUNTER — Encounter (INDEPENDENT_AMBULATORY_CARE_PROVIDER_SITE_OTHER): Payer: Self-pay | Admitting: Family Medicine

## 2019-12-07 ENCOUNTER — Other Ambulatory Visit: Payer: Self-pay

## 2019-12-07 ENCOUNTER — Ambulatory Visit (INDEPENDENT_AMBULATORY_CARE_PROVIDER_SITE_OTHER): Payer: Medicare Other | Admitting: Family Medicine

## 2019-12-07 VITALS — BP 125/81 | HR 59 | Temp 97.9°F | Ht 63.0 in | Wt 165.0 lb

## 2019-12-07 DIAGNOSIS — I10 Essential (primary) hypertension: Secondary | ICD-10-CM | POA: Diagnosis not present

## 2019-12-07 DIAGNOSIS — E669 Obesity, unspecified: Secondary | ICD-10-CM

## 2019-12-07 DIAGNOSIS — Z6830 Body mass index (BMI) 30.0-30.9, adult: Secondary | ICD-10-CM | POA: Diagnosis not present

## 2019-12-07 DIAGNOSIS — E7849 Other hyperlipidemia: Secondary | ICD-10-CM

## 2019-12-07 DIAGNOSIS — R7303 Prediabetes: Secondary | ICD-10-CM | POA: Diagnosis not present

## 2019-12-07 NOTE — Progress Notes (Signed)
Chief Complaint:   OBESITY Charyn is here to discuss her progress with her obesity treatment plan along with follow-up of her obesity related diagnoses. Carry is on keeping a food journal and adhering to recommended goals of 1250 calories and 94 grams of protein and states she is following her eating plan approximately 80% of the time. Brynlynn states she is walking 1-2 miles 7 times per week.  Today's visit was #: 25 Starting weight: 189 lbs Starting date: 10/12/2018 Today's weight: 165 lbs Today's date: 12/07/2019 Total lbs lost to date: 24 lbs Total lbs lost since last in-office visit: 0  Interim History: Carollynn retired since October.  Trying to get on a new schedule. Walking in am and then cooking.  Stuff she wants to do.  Eating a small breakfast.  Still around 1205 calories and aiming to 94 grams protein but getting to 90 grams.  Subjective:   1. Other hyperlipidemia Jazzelyn has hyperlipidemia and has been trying to improve her cholesterol levels with intensive lifestyle modification including a low saturated fat diet, exercise and weight loss. She denies any chest pain, claudication or myalgias.  She is currently taking atorvastatin.  Lab Results  Component Value Date   ALT 45 (H) 11/23/2019   AST 25 11/23/2019   ALKPHOS 62 11/23/2019   BILITOT 0.3 11/23/2019   Lab Results  Component Value Date   CHOL 170 06/22/2019   HDL 46 06/22/2019   LDLCALC 103 (H) 06/22/2019   LDLDIRECT 83.0 01/28/2017   TRIG 103 06/22/2019   CHOLHDL 6 01/30/2019   2. Prediabetes Paytyn has a diagnosis of prediabetes based on her elevated HgA1c and was informed this puts her at greater risk of developing diabetes. She continues to work on diet and exercise to decrease her risk of diabetes. She denies nausea or hypoglycemia.  She is not on metformin.  Lab Results  Component Value Date   HGBA1C 5.4 11/23/2019   Lab Results  Component Value Date   INSULIN 10.3 11/23/2019   INSULIN  6.7 06/22/2019   INSULIN 6.2 01/23/2019   INSULIN 5.9 10/12/2018   3. Essential hypertension Automatic BP cuff is 125/81 (patient brought wrist cuff and it was [id number] systolic, but earlier am reading on same cuff was [id number] systolic).  Manual BP 156/96.  Wrist at heart level was 146/85.  No chest pain, chest pressure, or headache.  BP Readings from Last 3 Encounters:  12/07/19 125/81  12/01/19 (!) 151/83  11/25/19 136/60   Assessment/Plan:   1. Other hyperlipidemia Cardiovascular risk and specific lipid/LDL goals reviewed.  We discussed several lifestyle modifications today and Meeghan will continue to work on diet, exercise and weight loss efforts. Orders and follow up as documented in patient record.   Counseling Intensive lifestyle modifications are the first line treatment for this issue. . Dietary changes: Increase soluble fiber. Decrease simple carbohydrates. . Exercise changes: Moderate to vigorous-intensity aerobic activity 150 minutes per week if tolerated. . Lipid-lowering medications: see documented in medical record. - Lipid Panel With LDL/HDL Ratio  2. Prediabetes Oshyn will continue to work on weight loss, exercise, and decreasing simple carbohydrates to help decrease the risk of diabetes.  Will check labs in 3 months.  3. Essential hypertension She has an echo scheduled next week.  Follow-up with Cardiology at previously scheduled appointment.  4. Class 1 obesity with serious comorbidity and body mass index (BMI) of 30.0 to 30.9 in adult, unspecified obesity type Deaysia is currently in the action stage  of change. As such, her goal is to continue with weight loss efforts. She has agreed to keeping a food journal and adhering to recommended goals of 1100-1150 calories and 80 grams of protein.   Exercise goals: Older adults should follow the adult guidelines. When older adults cannot meet the adult guidelines, they should be as physically active as their abilities and  conditions will allow.  Older adults should do exercises that maintain or improve balance if they are at risk of falling.   Behavioral modification strategies: increasing lean protein intake, increasing vegetables, meal planning and cooking strategies and keeping healthy foods in the home.  Everlee has agreed to follow-up with our clinic in 2 weeks. She was informed of the importance of frequent follow-up visits to maximize her success with intensive lifestyle modifications for her multiple health conditions.   Ahyanna was informed we would discuss her lab results at her next visit unless there is a critical issue that needs to be addressed sooner. Nisa agreed to keep her next visit at the agreed upon time to discuss these results.  Objective:   Blood pressure 125/81, pulse (!) 59, temperature 97.9 F (36.6 C), temperature source Oral, height 5\' 3"  (1.6 m), weight 165 lb (74.8 kg), SpO2 98 %. Body mass index is 29.23 kg/m.  General: Cooperative, alert, well developed, in no acute distress. HEENT: Conjunctivae and lids unremarkable. Cardiovascular: Regular rhythm.  Lungs: Normal work of breathing. Neurologic: No focal deficits.   Lab Results  Component Value Date   CREATININE 0.78 11/25/2019   BUN 19 11/25/2019   NA 140 11/25/2019   K 3.6 11/25/2019   CL 102 11/25/2019   CO2 27 11/25/2019   Lab Results  Component Value Date   ALT 45 (H) 11/23/2019   AST 25 11/23/2019   ALKPHOS 62 11/23/2019   BILITOT 0.3 11/23/2019   Lab Results  Component Value Date   HGBA1C 5.4 11/23/2019   HGBA1C 5.2 06/22/2019   HGBA1C 5.3 01/23/2019   HGBA1C 5.6 10/12/2018   HGBA1C 6.1 01/28/2017   Lab Results  Component Value Date   INSULIN 10.3 11/23/2019   INSULIN 6.7 06/22/2019   INSULIN 6.2 01/23/2019   INSULIN 5.9 10/12/2018   Lab Results  Component Value Date   TSH 2.340 12/01/2019   Lab Results  Component Value Date   CHOL 170 06/22/2019   HDL 46 06/22/2019   LDLCALC 103 (H)  06/22/2019   LDLDIRECT 83.0 01/28/2017   TRIG 103 06/22/2019   CHOLHDL 6 01/30/2019   Lab Results  Component Value Date   WBC 8.3 11/25/2019   HGB 13.4 11/25/2019   HCT 41.9 11/25/2019   MCV 79.8 (L) 11/25/2019   PLT 185 11/25/2019   Lab Results  Component Value Date   IRON 52 12/12/2007    Obesity Behavioral Intervention Documentation for Insurance:   Approximately 15 minutes were spent on the discussion below.  ASK: We discussed the diagnosis of obesity with [name] today and Galyn agreed to give [name] permission to discuss obesity behavioral modification therapy today.  ASSESS: Myna has the diagnosis of obesity and her BMI today is 29.2. Jazzlene is in the action stage of change.   ADVISE: Nichol was educated on the multiple health risks of obesity as well as the benefit of weight loss to improve her health. She was advised of the need for long term treatment and the importance of lifestyle modifications to improve her current health and to decrease her risk of future health  problems.  AGREE: Multiple dietary modification options and treatment options were discussed and Izel agreed to follow the recommendations documented in the above note.  ARRANGE: Kornelia was educated on the importance of frequent visits to treat obesity as outlined per CMS and USPSTF guidelines and agreed to schedule her next follow up appointment today.  Attestation Statements:   Reviewed by clinician on day of visit: allergies, medications, problem list, medical history, surgical history, family history, social history, and previous encounter notes.  I, Water quality scientist, CMA, am acting as transcriptionist for Ilene Qua, MD.  I have reviewed the above documentation for accuracy and completeness, and I agree with the above. -  AK

## 2019-12-08 LAB — LIPID PANEL WITH LDL/HDL RATIO
Cholesterol, Total: 173 mg/dL (ref 100–199)
HDL: 49 mg/dL (ref >39)
LDL Chol Calc (NIH): 105 mg/dL — ABNORMAL HIGH (ref 0–99)
LDL/HDL Ratio: 2.1 ratio (ref 0.0–3.2)
Triglycerides: 105 mg/dL (ref 0–149)
VLDL Cholesterol Cal: 19 mg/dL (ref 5–40)

## 2019-12-13 ENCOUNTER — Ambulatory Visit (HOSPITAL_COMMUNITY): Payer: Medicare Other | Attending: Cardiovascular Disease

## 2019-12-13 ENCOUNTER — Other Ambulatory Visit: Payer: Self-pay

## 2019-12-13 DIAGNOSIS — R011 Cardiac murmur, unspecified: Secondary | ICD-10-CM | POA: Diagnosis not present

## 2019-12-13 DIAGNOSIS — R0789 Other chest pain: Secondary | ICD-10-CM | POA: Insufficient documentation

## 2019-12-27 ENCOUNTER — Telehealth (HOSPITAL_COMMUNITY): Payer: Self-pay | Admitting: Emergency Medicine

## 2019-12-27 ENCOUNTER — Encounter (HOSPITAL_COMMUNITY): Payer: Self-pay

## 2019-12-27 ENCOUNTER — Other Ambulatory Visit: Payer: Self-pay | Admitting: Emergency Medicine

## 2019-12-27 DIAGNOSIS — I712 Thoracic aortic aneurysm, without rupture, unspecified: Secondary | ICD-10-CM

## 2019-12-27 NOTE — Telephone Encounter (Signed)
Reaching out to patient to offer assistance regarding upcoming cardiac imaging study; pt verbalizes understanding of appt date/time, parking situation and where to check in, pre-test NPO status and medications ordered, and verified current allergies; name and call back number provided for further questions should they arise Marchia Bond RN Navigator Cardiac Imaging [facility] [phone] office [phone] cell  Last OV HR 59bpm. Suggested she take 1/2 tablet making a 50mg  dose rather than prescribed 100mg  metoprolol dose. Pt verbalized understanding

## 2019-12-28 ENCOUNTER — Ambulatory Visit
Admission: RE | Admit: 2019-12-28 | Discharge: 2019-12-28 | Disposition: A | Discharge status: Home / Self Care | Payer: Medicare Other | Source: Ambulatory Visit | Attending: Cardiovascular Disease | Admitting: Cardiovascular Disease

## 2019-12-28 ENCOUNTER — Other Ambulatory Visit: Payer: Self-pay

## 2019-12-28 DIAGNOSIS — I712 Thoracic aortic aneurysm, without rupture, unspecified: Secondary | ICD-10-CM

## 2019-12-28 DIAGNOSIS — R072 Precordial pain: Secondary | ICD-10-CM | POA: Diagnosis not present

## 2019-12-28 MED ORDER — IOHEXOL 350 MG/ML SOLN
125.0000 mL | Freq: Once | INTRAVENOUS | Status: AC | PRN
  Administered 2019-12-28: 125 mL via INTRAVENOUS

## 2019-12-28 MED ORDER — NITROGLYCERIN 0.4 MG SL SUBL
0.8000 mg | SUBLINGUAL_TABLET | Freq: Once | SUBLINGUAL | Status: AC
  Administered 2019-12-28: 11:00:00 0.8 mg via SUBLINGUAL

## 2019-12-28 NOTE — Progress Notes (Signed)
Patient tolerated CT well. Drank water after. Ambulatory to exit steady gait.  

## 2019-12-29 ENCOUNTER — Other Ambulatory Visit: Payer: Self-pay | Admitting: Cardiovascular Disease

## 2019-12-29 DIAGNOSIS — I10 Essential (primary) hypertension: Secondary | ICD-10-CM

## 2020-01-02 ENCOUNTER — Other Ambulatory Visit: Payer: Self-pay

## 2020-01-02 ENCOUNTER — Encounter (INDEPENDENT_AMBULATORY_CARE_PROVIDER_SITE_OTHER): Payer: Self-pay | Admitting: Family Medicine

## 2020-01-02 ENCOUNTER — Ambulatory Visit (INDEPENDENT_AMBULATORY_CARE_PROVIDER_SITE_OTHER): Payer: Medicare Other | Admitting: Family Medicine

## 2020-01-02 VITALS — BP 132/83 | HR 63 | Temp 98.0°F | Ht 63.0 in | Wt 164.0 lb

## 2020-01-02 DIAGNOSIS — Z6830 Body mass index (BMI) 30.0-30.9, adult: Secondary | ICD-10-CM

## 2020-01-02 DIAGNOSIS — E669 Obesity, unspecified: Secondary | ICD-10-CM

## 2020-01-02 DIAGNOSIS — K760 Fatty (change of) liver, not elsewhere classified: Secondary | ICD-10-CM

## 2020-01-02 DIAGNOSIS — I10 Essential (primary) hypertension: Secondary | ICD-10-CM

## 2020-01-02 MED ORDER — HYDROCHLOROTHIAZIDE 12.5 MG PO TABS: 12.5000 mg | ORAL_TABLET | Freq: Every day | ORAL | 0 refills | Status: DC

## 2020-01-02 NOTE — Progress Notes (Signed)
Chief Complaint:   OBESITY Kristin Delgado is here to discuss her progress with her obesity treatment plan along with follow-up of her obesity related diagnoses. Kristin Delgado is on keeping a food journal of 1100 to 1150 calories and 80 grams of protein daily and states she is following her eating plan approximately 85 to 90% of the time. Kristin Delgado states she is walking the dog 30 minutes 7 times per week.  Today's visit was #: 26 Starting weight: 189 lbs Starting date: 10/12/2018 Today's weight: 164 lbs Today's date: 01/02/2020 Total lbs lost to date: 25 Total lbs lost since last in-office visit: 1  Interim History: Kristin Delgado is still commuting to and from place of work, driving her daughter to work. She is working on trying to get 1100 calories, but she is always hitting her protein goal. She has been making baked goods one time per week with sugar substitute.  Subjective:   NAFLD (nonalcoholic fatty liver disease) CT angio showing diffuse hepatic steatosis. Her last LDL was >100. Her last ALT was 45, AST was within normal limits and alkaline phosphatase was within normal limits.  Essential hypertension  Kristin Delgado has a diagnosis of hypertension and her blood pressure is controlled today. She denies chest pain, chest pressure or headache. She sees cardiology in either late March or early April.  BP Readings from Last 3 Encounters:  01/02/20 132/83  12/28/19 123/65  12/07/19 125/81   Lab Results  Component Value Date   CREATININE 0.78 11/25/2019   CREATININE 0.62 11/23/2019   CREATININE 0.74 06/22/2019   Assessment/Plan:   NAFLD (nonalcoholic fatty liver disease Lifestyle modifications were encouraged.  Essential hypertension  Kristin Delgado is working on healthy weight loss and exercise to improve blood pressure control. Kristin Delgado agrees to continue hydrochlorothiazide (HYDRODIURIL) 12.5 MG tablet PO daily #30 with no refills. We will watch for signs of hypotension as she continues her  lifestyle modifications.  Class 1 obesity with serious comorbidity and body mass index (BMI) of 30.0 to 30.9 in adult, unspecified obesity type - BMI greater than 30 at start of program  Kristin Delgado is currently in the action stage of change. As such, her goal is to continue with weight loss efforts. She has agreed to keeping a food journal and adhering to recommended goals of 1100 to 1150 calories and 80+ grams of protein daily.   Behavioral modification strategies: increasing lean protein intake, planning for success and keeping a strict food journal.  Kristin Delgado has agreed to follow-up with our clinic in 3 to 4 weeks. She was informed of the importance of frequent follow-up visits to maximize her success with intensive lifestyle modifications for her multiple health conditions.   Objective:   Blood pressure 132/83, pulse 63, temperature 98 F (36.7 C), temperature source Oral, height 5\' 3"  (1.6 m), weight 164 lb (74.4 kg), SpO2 97 %. Body mass index is 29.05 kg/m.  General: Cooperative, alert, well developed, in no acute distress. HEENT: Conjunctivae and lids unremarkable. Cardiovascular: Regular rhythm.  Lungs: Normal work of breathing. Neurologic: No focal deficits.   Lab Results  Component Value Date   CREATININE 0.78 11/25/2019   BUN 19 11/25/2019   NA 140 11/25/2019   K 3.6 11/25/2019   CL 102 11/25/2019   CO2 27 11/25/2019   Lab Results  Component Value Date   ALT 45 (H) 11/23/2019   AST 25 11/23/2019   ALKPHOS 62 11/23/2019   BILITOT 0.3 11/23/2019   Lab Results  Component Value Date  HGBA1C 5.4 11/23/2019   HGBA1C 5.2 06/22/2019   HGBA1C 5.3 01/23/2019   HGBA1C 5.6 10/12/2018   HGBA1C 6.1 01/28/2017   Lab Results  Component Value Date   INSULIN 10.3 11/23/2019   INSULIN 6.7 06/22/2019   INSULIN 6.2 01/23/2019   INSULIN 5.9 10/12/2018   Lab Results  Component Value Date   TSH 2.340 12/01/2019   Lab Results  Component Value Date   CHOL 173 12/07/2019    HDL 49 12/07/2019   LDLCALC 105 (H) 12/07/2019   LDLDIRECT 83.0 01/28/2017   TRIG 105 12/07/2019   CHOLHDL 6 01/30/2019   Lab Results  Component Value Date   WBC 8.3 11/25/2019   HGB 13.4 11/25/2019   HCT 41.9 11/25/2019   MCV 79.8 (L) 11/25/2019   PLT 185 11/25/2019   Lab Results  Component Value Date   IRON 52 12/12/2007    Ref. Range 11/23/2019 16:29  Vitamin D, 25-Hydroxy Latest Ref Range: 30.0 - 100.0 ng/mL 86.5    Obesity Behavioral Intervention Documentation for Insurance:   Approximately 15 minutes were spent on the discussion below.  ASK: We discussed the diagnosis of obesity with Kristin Delgado today and Kristin Delgado agreed to give [name] permission to discuss obesity behavioral modification therapy today.  ASSESS: Kristin Delgado has the diagnosis of obesity and her BMI today is 29.06. Kristin Delgado is in the action stage of change.   ADVISE: Kristin Delgado was educated on the multiple health risks of obesity as well as the benefit of weight loss to improve her health. She was advised of the need for long term treatment and the importance of lifestyle modifications to improve her current health and to decrease her risk of future health problems.  AGREE: Multiple dietary modification options and treatment options were discussed and Kristin Delgado agreed to follow the recommendations documented in the above note.  ARRANGE: Kristin Delgado was educated on the importance of frequent visits to treat obesity as outlined per CMS and USPSTF guidelines and agreed to schedule her next follow up appointment today.  Attestation Statements:   Reviewed by clinician on day of visit: allergies, medications, problem list, medical history, surgical history, family history, social history, and previous encounter notes.  I, Doreene Nest, am acting as transcriptionist for Eber Jones, MD.  I have reviewed the above documentation for accuracy and completeness, and I agree with the above. - Ilene Qua, MD

## 2020-01-25 ENCOUNTER — Other Ambulatory Visit: Payer: Self-pay

## 2020-01-25 ENCOUNTER — Ambulatory Visit (INDEPENDENT_AMBULATORY_CARE_PROVIDER_SITE_OTHER): Payer: Medicare Other | Admitting: Family Medicine

## 2020-01-25 ENCOUNTER — Encounter (INDEPENDENT_AMBULATORY_CARE_PROVIDER_SITE_OTHER): Payer: Self-pay | Admitting: Family Medicine

## 2020-01-25 VITALS — BP 146/86 | HR 60 | Temp 98.0°F | Ht 63.0 in | Wt 167.0 lb

## 2020-01-25 DIAGNOSIS — Z6830 Body mass index (BMI) 30.0-30.9, adult: Secondary | ICD-10-CM | POA: Diagnosis not present

## 2020-01-25 DIAGNOSIS — I10 Essential (primary) hypertension: Secondary | ICD-10-CM

## 2020-01-25 DIAGNOSIS — R7303 Prediabetes: Secondary | ICD-10-CM | POA: Diagnosis not present

## 2020-01-25 DIAGNOSIS — E669 Obesity, unspecified: Secondary | ICD-10-CM

## 2020-01-25 MED ORDER — HYDROCHLOROTHIAZIDE 12.5 MG PO TABS: 12.5000 mg | ORAL_TABLET | Freq: Every day | ORAL | 0 refills | Status: DC

## 2020-01-25 NOTE — Progress Notes (Signed)
Chief Complaint:   OBESITY Kristin Delgado is here to discuss her progress with her obesity treatment plan along with follow-up of her obesity related diagnoses. Kristin Delgado is keeping a food journal and adhering to recommended goals of 1100-1150 calories and 80+ grams of protein and states she is following her eating plan approximately 100% of the time. Kristin Delgado states she is walking the dog 1/2 miles 7 times per week.  Today's visit was #: 27 Starting weight: 189 lbs Starting date: 10/12/2018 Today's weight: 167 lbs Today's date: 01/25/2020 Total lbs lost to date: 22 Total lbs lost since last in-office visit: 0  Interim History: Kristin Delgado is feeling frustrated with the lack of weight loss. She reports getting in over 80 grams of protein a day and getting in approximately 1200-1300 calories. Scale has shown a gain of 3.8 lbs of water. She reports eating later in the day, but eating about the same food as her husband, just less of it. Kristin Delgado has been under stress with her daughter and is a stress non-eater.  Subjective:   Essential hypertension. Blood pressure is slightly elevated today. She reports an increase in stress recently. No chest pain, chest pressure, or headache. Kristin Delgado is on lisinopril and HCTZ.  BP Readings from Last 3 Encounters:  01/25/20 (!) 146/86  01/02/20 132/83  12/28/19 123/65   Lab Results  Component Value Date   CREATININE 0.78 11/25/2019   CREATININE 0.62 11/23/2019   CREATININE 0.74 06/22/2019    Prediabetes. Kristin Delgado has a diagnosis of prediabetes based on her elevated HgA1c and was informed this puts her at greater risk of developing diabetes. She continues to work on diet and exercise to decrease her risk of diabetes. She denies nausea or hypoglycemia.  Lab Results  Component Value Date   HGBA1C 5.4 11/23/2019   Lab Results  Component Value Date   INSULIN 10.3 11/23/2019   INSULIN 6.7 06/22/2019   INSULIN 6.2 01/23/2019   INSULIN 5.9 10/12/2018    Assessment/Plan:   Essential hypertension. Kristin Delgado is working on healthy weight loss and exercise to improve blood pressure control. We will watch for signs of hypotension as she continues her lifestyle modifications.She was given a refill on her hydrochlorothiazide (HYDRODIURIL) 12.5 MG tablet PO daily #30 with 0 refills.  Prediabetes. Kristin Delgado will continue to work on weight loss, exercise, and decreasing simple carbohydrates to help decrease the risk of diabetes.   Class 1 obesity with serious comorbidity and body mass index (BMI) of 30.0 to 30.9 in adult, unspecified obesity type - BMI greater than 30 at start of program.   Kristin Delgado is currently in the action stage of change. As such, her goal is to continue with weight loss efforts. She has agreed to keeping a food journal and adhering to recommended goals of 1050-1150 calories and 75+ grams of protein daily.   Exercise goals: No exercise has been prescribed at this time.  Behavioral modification strategies: increasing lean protein intake, increasing vegetables, meal planning and cooking strategies, keeping healthy foods in the home and planning for success.  Kristin Delgado has agreed to follow-up with our clinic in 3 weeks. She was informed of the importance of frequent follow-up visits to maximize her success with intensive lifestyle modifications for her multiple health conditions.   Objective:   Blood pressure (!) 146/86, pulse 60, temperature 98 F (36.7 C), temperature source Oral, height 5\' 3"  (1.6 m), weight 167 lb (75.8 kg), last menstrual period 06/10/2011, SpO2 97 %. Body mass index is  29.58 kg/m.  General: Cooperative, alert, well developed, in no acute distress. HEENT: Conjunctivae and lids unremarkable. Cardiovascular: Regular rhythm.  Lungs: Normal work of breathing. Neurologic: No focal deficits.   Lab Results  Component Value Date   CREATININE 0.78 11/25/2019   BUN 19 11/25/2019   NA 140 11/25/2019   K 3.6 11/25/2019    CL 102 11/25/2019   CO2 27 11/25/2019   Lab Results  Component Value Date   ALT 45 (H) 11/23/2019   AST 25 11/23/2019   ALKPHOS 62 11/23/2019   BILITOT 0.3 11/23/2019   Lab Results  Component Value Date   HGBA1C 5.4 11/23/2019   HGBA1C 5.2 06/22/2019   HGBA1C 5.3 01/23/2019   HGBA1C 5.6 10/12/2018   HGBA1C 6.1 01/28/2017   Lab Results  Component Value Date   INSULIN 10.3 11/23/2019   INSULIN 6.7 06/22/2019   INSULIN 6.2 01/23/2019   INSULIN 5.9 10/12/2018   Lab Results  Component Value Date   TSH 2.340 12/01/2019   Lab Results  Component Value Date   CHOL 173 12/07/2019   HDL 49 12/07/2019   LDLCALC 105 (H) 12/07/2019   LDLDIRECT 83.0 01/28/2017   TRIG 105 12/07/2019   CHOLHDL 6 01/30/2019   Lab Results  Component Value Date   WBC 8.3 11/25/2019   HGB 13.4 11/25/2019   HCT 41.9 11/25/2019   MCV 79.8 (L) 11/25/2019   PLT 185 11/25/2019   Lab Results  Component Value Date   IRON 52 12/12/2007   Obesity Behavioral Intervention Documentation for Insurance:   Approximately 15 minutes were spent on the discussion below.  ASK: We discussed the diagnosis of obesity with Kristin Delgado today and Kristin Delgado agreed to give [name] permission to discuss obesity behavioral modification therapy today.  ASSESS: Kristin Delgado has the diagnosis of obesity and her BMI today is 29.6. Kristin Delgado is in the action stage of change.   ADVISE: Kristin Delgado was educated on the multiple health risks of obesity as well as the benefit of weight loss to improve her health. She was advised of the need for long term treatment and the importance of lifestyle modifications to improve her current health and to decrease her risk of future health problems.  AGREE: Multiple dietary modification options and treatment options were discussed and Kristin Delgado agreed to follow the recommendations documented in the above note.  ARRANGE: Kristin Delgado was educated on the importance of frequent visits to treat obesity as outlined  per CMS and USPSTF guidelines and agreed to schedule her next follow up appointment today.  Attestation Statements:   Reviewed by clinician on day of visit: allergies, medications, problem list, medical history, surgical history, family history, social history, and previous encounter notes.  I, Michaelene Song, am acting as transcriptionist for Coralie Common, MD   I have reviewed the above documentation for accuracy and completeness, and I agree with the above. - Ilene Qua, MD

## 2020-01-31 ENCOUNTER — Other Ambulatory Visit: Payer: Self-pay | Admitting: Family Medicine

## 2020-01-31 DIAGNOSIS — E785 Hyperlipidemia, unspecified: Secondary | ICD-10-CM

## 2020-02-06 ENCOUNTER — Other Ambulatory Visit: Payer: Self-pay

## 2020-02-07 ENCOUNTER — Ambulatory Visit: Payer: Medicare Other | Admitting: Family Medicine

## 2020-02-12 ENCOUNTER — Other Ambulatory Visit: Payer: Self-pay

## 2020-02-13 ENCOUNTER — Ambulatory Visit (INDEPENDENT_AMBULATORY_CARE_PROVIDER_SITE_OTHER): Payer: Medicare Other | Admitting: Family Medicine

## 2020-02-13 ENCOUNTER — Encounter: Payer: Self-pay | Admitting: Family Medicine

## 2020-02-13 VITALS — BP 120/90 | HR 57 | Temp 97.5°F | Resp 18 | Ht 63.0 in | Wt 170.6 lb

## 2020-02-13 DIAGNOSIS — I10 Essential (primary) hypertension: Secondary | ICD-10-CM

## 2020-02-13 DIAGNOSIS — E669 Obesity, unspecified: Secondary | ICD-10-CM

## 2020-02-13 DIAGNOSIS — E785 Hyperlipidemia, unspecified: Secondary | ICD-10-CM | POA: Diagnosis not present

## 2020-02-13 DIAGNOSIS — D352 Benign neoplasm of pituitary gland: Secondary | ICD-10-CM

## 2020-02-13 LAB — COMPREHENSIVE METABOLIC PANEL
ALT: 38 U/L — ABNORMAL HIGH (ref 0–35)
AST: 22 U/L (ref 0–37)
Albumin: 4.1 g/dL (ref 3.5–5.2)
Alkaline Phosphatase: 50 U/L (ref 39–117)
BUN: 20 mg/dL (ref 6–23)
CO2: 33 mEq/L — ABNORMAL HIGH (ref 19–32)
Calcium: 9.5 mg/dL (ref 8.4–10.5)
Chloride: 103 mEq/L (ref 96–112)
Creatinine, Ser: 0.61 mg/dL (ref 0.40–1.20)
GFR: 98.33 mL/min (ref >60.00)
Glucose, Bld: 91 mg/dL (ref 70–99)
Potassium: 4 mEq/L (ref 3.5–5.1)
Sodium: 141 mEq/L (ref 135–145)
Total Bilirubin: 0.5 mg/dL (ref 0.2–1.2)
Total Protein: 6.4 g/dL (ref 6.0–8.3)

## 2020-02-13 LAB — LIPID PANEL
Cholesterol: 150 mg/dL (ref 0–200)
HDL: 40.7 mg/dL (ref >39.00)
LDL Cholesterol: 88 mg/dL (ref 0–99)
NonHDL: 109.68
Total CHOL/HDL Ratio: 4
Triglycerides: 108 mg/dL (ref 0.0–149.0)
VLDL: 21.6 mg/dL (ref 0.0–40.0)

## 2020-02-13 MED ORDER — BROMOCRIPTINE MESYLATE 2.5 MG PO TABS: 2.5000 mg | ORAL_TABLET | Freq: Every day | ORAL | 3 refills | Status: DC

## 2020-02-13 NOTE — Assessment & Plan Note (Signed)
Tolerating statin, encouraged heart healthy diet, avoid trans fats, minimize simple carbs and saturated fats. Increase exercise as tolerated 

## 2020-02-13 NOTE — Progress Notes (Signed)
Patient ID: Kristin Delgado, female    DOB: [birth year]  Age: 64 y.o. MRN: [id number]    Subjective:  Subjective  HPI Kristin Delgado presents for f/u bp , chol and healthy weight and wellness  Kristin Delgado needs refills   Review of Systems  Constitutional: Negative for appetite change, diaphoresis, fatigue and unexpected weight change.  Eyes: Negative for pain, redness and visual disturbance.  Respiratory: Negative for cough, chest tightness, shortness of breath and wheezing.   Cardiovascular: Negative for chest pain, palpitations and leg swelling.  Endocrine: Negative for cold intolerance, heat intolerance, polydipsia, polyphagia and polyuria.  Genitourinary: Negative for difficulty urinating, dysuria and frequency.  Neurological: Negative for dizziness, light-headedness, numbness and headaches.    History Past Medical History:  Diagnosis Date  . Allergy    seasonal  . Anxiety    closed space like MRI  . Back pain \  . Chest pain 2006   admitted, essentially negative  . Constipation   . Depression   . GERD (gastroesophageal reflux disease)   . Hyperlipidemia   . Hypertension   . Infertility, female   . Joint pain   . Pituitary microadenoma ([place])   . Swallowing difficulty   . 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   Kristin Delgado family history includes Alzheimer's disease in Kristin Delgado father; Cancer (age of onset: 82) in Kristin Delgado mother; Dementia in Kristin Delgado father; Lung cancer in Kristin Delgado mother.Kristin Delgado reports that Kristin Delgado has never smoked. Kristin Delgado has never used smokeless tobacco. Kristin Delgado reports that Kristin Delgado does not drink alcohol or use drugs.  Current Outpatient Medications on File Prior to Visit  Medication Sig Dispense Refill  . acetaminophen (TYLENOL ARTHRITIS PAIN) 650 MG CR tablet Take 650 mg by mouth every 8 (eight) hours as needed. pain     . Ascorbic Acid (VITAMIN C)  1000 MG tablet Take 1,000 mg by mouth 2 (two) times daily.      [name] atorvastatin (LIPITOR) 20 MG tablet TAKE 1 TABLET BY MOUTH EVERY DAY 90 tablet 0  . b complex vitamins tablet Take 1 tablet by mouth daily.    . Calcium Acetate, Phos Binder, (CALCIUM ACETATE PO) Take by mouth.    . Calcium Polycarbophil (FIBER-CAPS PO) Take by mouth.    . cholecalciferol (VITAMIN D3) 25 MCG (1000 UT) tablet Take 5,000 Units by mouth daily.    . ferrous sulfate 325 (65 FE) MG EC tablet Take 325 mg by mouth 3 (three) times daily with meals.    . hydrochlorothiazide (HYDRODIURIL) 12.5 MG tablet Take 1 tablet (12.5 mg total) by mouth daily. 30 tablet 0  . lisinopril (ZESTRIL) 10 MG tablet Take 0.5 tablets (5 mg total) by mouth daily. 30 tablet 0  . metoprolol tartrate (LOPRESSOR) 100 MG tablet Take ONE tablet TWO HOURS prior to test. 1 tablet 0  . Multiple Vitamin (MULTIVITAMIN) tablet Take 1 tablet by mouth daily.      . Omega 3-6-9 Fatty Acids (OMEGA 3-6-9 COMPLEX PO) Take by mouth.    . Omega-3 Fatty Acids (FISH OIL) 1200 MG CAPS Take 1 capsule by mouth 2 (two) times daily.    . Probiotic Product (PROBIOTIC ADVANCED PO) Take by mouth.    . ST JOHNS WORT PO Take by mouth.     No current facility-administered medications on file prior to visit.     Objective:  Objective  Physical Exam Vitals and nursing note reviewed.  Constitutional:      Appearance: Kristin Delgado is well-developed.  HENT:     Head: Normocephalic and atraumatic.  Eyes:     Conjunctiva/sclera: Conjunctivae normal.  Neck:     Thyroid: No thyromegaly.     Vascular: No carotid bruit or JVD.  Cardiovascular:     Rate and Rhythm: Normal rate and regular rhythm.     Heart sounds: Murmur present.  Pulmonary:     Effort: Pulmonary effort is normal. No respiratory distress.     Breath sounds: Normal breath sounds. No wheezing or rales.  Chest:     Chest wall: No tenderness.  Musculoskeletal:     Cervical back: Normal range of motion and neck  supple.  Neurological:     Mental Status: Kristin Delgado is alert and oriented to person, place, and time.    Diabetic Foot Exam - Simple   Simple Foot Form Diabetic Foot exam was performed with the following findings: Yes 02/13/2020 10:30 AM  Visual Inspection No deformities, no ulcerations, no other skin breakdown bilaterally: Yes Sensation Testing Intact to touch and monofilament testing bilaterally: Yes Pulse Check Posterior Tibialis and Dorsalis pulse intact bilaterally: Yes Comments     BP 120/90 (BP Location: Left Arm, Patient Position: Sitting, Cuff Size: Normal)   Pulse (!) 57   Temp (!) 97.5 F (36.4 C) (Temporal)   Resp 18   Ht 5\' 3"  (1.6 m)   Wt 170 lb 9.6 oz (77.4 kg)   LMP 06/10/2011   SpO2 100%   BMI 30.22 kg/m  Wt Readings from Last 3 Encounters:  02/13/20 170 lb 9.6 oz (77.4 kg)  01/25/20 167 lb (75.8 kg)  01/02/20 164 lb (74.4 kg)     Lab Results  Component Value Date   WBC 8.3 11/25/2019   HGB 13.4 11/25/2019   HCT 41.9 11/25/2019   PLT 185 11/25/2019   GLUCOSE 115 (H) 11/25/2019   CHOL 173 12/07/2019   TRIG 105 12/07/2019   HDL 49 12/07/2019   LDLDIRECT 83.0 01/28/2017   LDLCALC 105 (H) 12/07/2019   ALT 45 (H) 11/23/2019   AST 25 11/23/2019   NA 140 11/25/2019   K 3.6 11/25/2019   CL 102 11/25/2019   CREATININE 0.78 11/25/2019   BUN 19 11/25/2019   CO2 27 11/25/2019   TSH 2.340 12/01/2019   HGBA1C 5.4 11/23/2019    CT CORONARY MORPH W/CTA COR W/SCORE W/CA W/CM &/OR WO/CM  Addendum Date: 12/28/2019   ADDENDUM REPORT: 12/28/2019 16:29 EXAM: OVER-READ INTERPRETATION  CT CHEST The following report is an over-read performed by radiologist Dr. Rosine Beat [facility] on 12/28/2019. This over-read does not include interpretation of cardiac or coronary anatomy or pathology. The coronary CTA interpretation by the cardiologist is attached. COMPARISON:  None. FINDINGS: The visualized portions of the lower lung fields show no suspicious  nodules, masses, or infiltrates. No pleural fluid seen. The visualized portions of the mediastinum show no lymphadenopathy. Tiny hiatal hernia noted. Imaged portion of the upper abdomen is unremarkable. IMPRESSION: No significant non-cardiovascular abnormality seen in visualized portion of the thorax. Electronically Signed   By: Misty Stanley M.D.   On: 12/28/2019 16:29   Result Date: 12/28/2019 CLINICAL DATA:  Hx of htn, hld, atypical chestpain EXAM: Cardiac/Coronary  CTA TECHNIQUE: The patient was scanned on a Siemens Somatoform go.Top scanner. FINDINGS: A retrospective scan was triggered in the descending thoracic aorta. Axial non-contrast 3 mm slices were carried  out through the heart. The data set was analyzed on a dedicated work station and scored using the [place]. Gantry rotation speed was 330 msecs and collimation was .6 mm. 50mg  of metoprolol and 0.8 mg of sl NTG was given. The 3D data set was reconstructed in 5% intervals of the 35-95 % of the R-R cycle. Diastolic phases were analyzed on a dedicated work station using MPR, MIP and VRT modes. The patient received 125 cc of contrast. Aorta: Mild ascending aorta dilation, measuring 21mm in largest diameter. Mild ascending aortic wall calcifications. No dissection. Aortic Valve:  Trileaflet.  No calcifications. Coronary Arteries:  Normal coronary origin.  Right dominance. RCA is a large dominant artery that gives rise to PDA and PLA. There is no plaque. Left main is a large artery that gives rise to LAD and LCX arteries. LAD is a large vessel that has no plaque. LCX is a non-dominant artery that gives rise to one large first obtuse marginal branch. There is no plaque. Other findings: Normal pulmonary vein drainage into the left atrium. Normal left atrial appendage without a thrombus. Normal size of the pulmonary artery. IMPRESSION: 1. Coronary calcium score of 0. 2. Mild ascending aorta dilation, measuring 67mm in largest diameter 3. Normal coronary  origin with right dominance. 4. No evidence of CAD. 5. CAD-RADS 0. No evidence of CAD (0%). Consider non-atherosclerotic causes of chest pain. Electronically Signed: By: Kate Sable M.D. On: 12/28/2019 13:03   CT ANGIO CHEST AORTA W/CM & OR WO/CM  Result Date: 12/28/2019 CLINICAL DATA:  Ascending thoracic aortic aneurysm on a cardiac/coronary CTA today. EXAM: CT ANGIOGRAPHY CHEST WITH CONTRAST TECHNIQUE: Multidetector CT imaging of the chest was performed using the standard protocol during bolus administration of intravenous contrast. Multiplanar CT image reconstructions and MIPs were obtained to evaluate the vascular anatomy. CONTRAST:  140mL OMNIPAQUE IOHEXOL 350 MG/ML SOLN COMPARISON:  Cardiac/coronary CTA obtained today. FINDINGS: Cardiovascular: Borderline aneurysmal dilatation of the ascending thoracic aorta with a maximum diameter of 4.0 cm. The remainder of the aorta is normal in caliber. Mild thoracic aortic atheromatous calcifications. Mediastinum/Nodes: Small hiatal hernia. No enlarged mediastinal, hilar, or axillary lymph nodes. Thyroid gland, trachea, and esophagus demonstrate no significant findings. Lungs/Pleura: Very minimal bilateral dependent atelectasis. Otherwise, clear lungs. No pleural fluid. Upper Abdomen: Diffuse low density of the liver relative to the spleen. Musculoskeletal: Mild thoracic spine degenerative changes. Mild-to-moderate lower cervical spine degenerative changes. Moderate thoracolumbar spine degenerative changes. Review of the MIP images confirms the above findings. IMPRESSION: 1. Borderline aneurysmal dilatation of the ascending thoracic aorta with a maximum diameter of 4.0 cm. Recommend annual imaging followup by CTA or MRA. This recommendation follows 2010 ACCF/AHA/AATS/ACR/ASA/SCA/SCAI/SIR/STS/SVM Guidelines for the Diagnosis and Management of Patients with Thoracic Aortic Disease. Circulation. 2010; [id number]. Aortic aneurysm NOS (ICD10-I71.9) 2. Small  hiatal hernia. 3. Diffuse hepatic steatosis. Aortic Atherosclerosis (ICD10-I70.0). Electronically Signed   By: Claudie Revering M.D.   On: 12/28/2019 20:05     Assessment & Plan:  Plan  I am having Veverly Fells maintain Kristin Delgado multivitamin, vitamin C, acetaminophen, Fish Oil, Omega 3-6-9 Fatty Acids (OMEGA 3-6-9 COMPLEX PO), Probiotic Product (PROBIOTIC ADVANCED PO), ST JOHNS WORT PO, Calcium Polycarbophil (FIBER-CAPS PO), ferrous sulfate, b complex vitamins, cholecalciferol, (Calcium Acetate, Phos Binder, (CALCIUM ACETATE PO)), lisinopril, metoprolol tartrate, hydrochlorothiazide, atorvastatin, and bromocriptine.  Meds ordered this encounter  Medications  . bromocriptine (PARLODEL) 2.5 MG tablet    Sig: Take 1 tablet (2.5 mg total) by mouth daily.  Dispense:  90 tablet    Refill:  3    Problem List Items Addressed This Visit      Unprioritized   Essential hypertension    Well controlled, no changes to meds. Encouraged heart healthy diet such as the DASH diet and exercise as tolerated.        Hyperlipidemia - Primary    Tolerating statin, encouraged heart healthy diet, avoid trans fats, minimize simple carbs and saturated fats. Increase exercise as tolerated      Relevant Orders   Lipid panel   Comprehensive metabolic panel   Lipid panel    Other Visit Diagnoses    Pituitary microadenoma ([place])       Relevant Medications   bromocriptine (PARLODEL) 2.5 MG tablet   Other Relevant Orders   Prolactin   Class 3 obesity       Relevant Orders   Insulin, random    con't with healthy weight and wellness Follow-up: Return in about 6 months (around 08/15/2020), or if symptoms worsen or fail to improve, for hypertension, hyperlipidemia.  [name], DO

## 2020-02-13 NOTE — Patient Instructions (Signed)

## 2020-02-13 NOTE — Assessment & Plan Note (Signed)
Well controlled, no changes to meds. Encouraged heart healthy diet such as the DASH diet and exercise as tolerated.  °

## 2020-02-14 LAB — INSULIN, RANDOM: Insulin: 6.9 u[IU]/mL

## 2020-02-14 LAB — PROLACTIN: Prolactin: 7.3 ng/mL

## 2020-02-15 ENCOUNTER — Ambulatory Visit (INDEPENDENT_AMBULATORY_CARE_PROVIDER_SITE_OTHER): Payer: Medicare Other | Admitting: Family Medicine

## 2020-02-15 ENCOUNTER — Encounter (INDEPENDENT_AMBULATORY_CARE_PROVIDER_SITE_OTHER): Payer: Self-pay | Admitting: Family Medicine

## 2020-02-15 ENCOUNTER — Other Ambulatory Visit: Payer: Self-pay

## 2020-02-15 VITALS — BP 149/83 | HR 60 | Temp 98.3°F | Ht 63.0 in | Wt 165.0 lb

## 2020-02-15 DIAGNOSIS — E669 Obesity, unspecified: Secondary | ICD-10-CM

## 2020-02-15 DIAGNOSIS — I10 Essential (primary) hypertension: Secondary | ICD-10-CM | POA: Diagnosis not present

## 2020-02-15 DIAGNOSIS — Z6830 Body mass index (BMI) 30.0-30.9, adult: Secondary | ICD-10-CM

## 2020-02-15 NOTE — Progress Notes (Signed)
Chief Complaint:   OBESITY Kristin Delgado is here to discuss her progress with her obesity treatment plan along with follow-up of her obesity related diagnoses. Kristin Delgado is on keeping a food journal and adhering to recommended goals of 1050-1150 calories and 75+ grams of protein and states she is following her eating plan approximately 90% of the time. Kristin Delgado states she is walking the dog 2.5 miles 7 times per week.  Today's visit was #: 28 Starting weight: 189 lbs Starting date: 10/12/2018 Today's weight: 165 lbs Today's date: 02/15/2020 Total lbs lost to date: 24 lbs Total lbs lost since last in-office visit: 0  Interim History: Kristin Delgado is doing well.  She has been walking more and eating all of her calories. She has had some hunger late at night.  She is getting at least 1000 calories in with some days around 1300.  She is getting around 85 grams of protein.  She says she is doing well regarding stress.  She reports there is still some underlying contention with daughter at home.  Subjective:   1. Essential hypertension Review: taking medications as instructed, no medication side effects noted, no chest pain on exertion, no dyspnea on exertion, no swelling of ankles.  Blood pressure is slightly elevated today.  She is taking lisinopril and HCTZ.  She says she didn't get much sleep last night.   BP Readings from Last 3 Encounters:  02/15/20 (!) 149/83  02/13/20 120/90  01/25/20 (!) 146/86   Assessment/Plan:   1. Essential hypertension Kristin Delgado is working on healthy weight loss and exercise to improve blood pressure control. We will watch for signs of hypotension as she continues her lifestyle modifications.  2. Class 1 obesity with serious comorbidity and body mass index (BMI) of 30.0 to 30.9 in adult, unspecified obesity type Kristin Delgado is currently in the action stage of change. As such, her goal is to continue with weight loss efforts. She has agreed to keeping a food journal and  adhering to recommended goals of 1050-1150 calories and 75+ grams of protein.   Exercise goals: As is.  Behavioral modification strategies: increasing lean protein intake, increasing vegetables, meal planning and cooking strategies and keeping a strict food journal.  Kristin Delgado has agreed to follow-up with our clinic in 4 weeks. She was informed of the importance of frequent follow-up visits to maximize her success with intensive lifestyle modifications for her multiple health conditions.   Objective:   Blood pressure (!) 149/83, pulse 60, temperature 98.3 F (36.8 C), temperature source Oral, height 5\' 3"  (1.6 m), weight 165 lb (74.8 kg), last menstrual period 06/10/2011, SpO2 98 %. Body mass index is 29.23 kg/m.  General: Cooperative, alert, well developed, in no acute distress. HEENT: Conjunctivae and lids unremarkable. Cardiovascular: Regular rhythm.  Lungs: Normal work of breathing. Neurologic: No focal deficits.   Lab Results  Component Value Date   CREATININE 0.61 02/13/2020   BUN 20 02/13/2020   NA 141 02/13/2020   K 4.0 02/13/2020   CL 103 02/13/2020   CO2 33 (H) 02/13/2020   Lab Results  Component Value Date   ALT 38 (H) 02/13/2020   AST 22 02/13/2020   ALKPHOS 50 02/13/2020   BILITOT 0.5 02/13/2020   Lab Results  Component Value Date   HGBA1C 5.4 11/23/2019   HGBA1C 5.2 06/22/2019   HGBA1C 5.3 01/23/2019   HGBA1C 5.6 10/12/2018   HGBA1C 6.1 01/28/2017   Lab Results  Component Value Date   INSULIN 10.3 11/23/2019  INSULIN 6.7 06/22/2019   INSULIN 6.2 01/23/2019   INSULIN 5.9 10/12/2018   Lab Results  Component Value Date   TSH 2.340 12/01/2019   Lab Results  Component Value Date   CHOL 150 02/13/2020   HDL 40.70 02/13/2020   LDLCALC 88 02/13/2020   LDLDIRECT 83.0 01/28/2017   TRIG 108.0 02/13/2020   CHOLHDL 4 02/13/2020   Lab Results  Component Value Date   WBC 8.3 11/25/2019   HGB 13.4 11/25/2019   HCT 41.9 11/25/2019   MCV 79.8 (L)  11/25/2019   PLT 185 11/25/2019   Lab Results  Component Value Date   IRON 52 12/12/2007   Attestation Statements:   Reviewed by clinician on day of visit: allergies, medications, problem list, medical history, surgical history, family history, social history, and previous encounter notes.  Time spent on visit including pre-visit chart review and post-visit care and charting was 15 minutes.   I, Water quality scientist, CMA, am acting as transcriptionist for Coralie Common, MD.  I have reviewed the above documentation for accuracy and completeness, and I agree with the above. - Ilene Qua, MD

## 2020-02-25 IMAGING — CR SACRUM AND COCCYX - 2+ VIEW
3 series · 3 of 3 positions shown · non-contrast
Comparison: None

CLINICAL DATA: LEFT side low back and buttock pain since falling a
few days ago

EXAM:
SACRUM AND COCCYX - 2+ VIEW

[t sacrum a.p.]
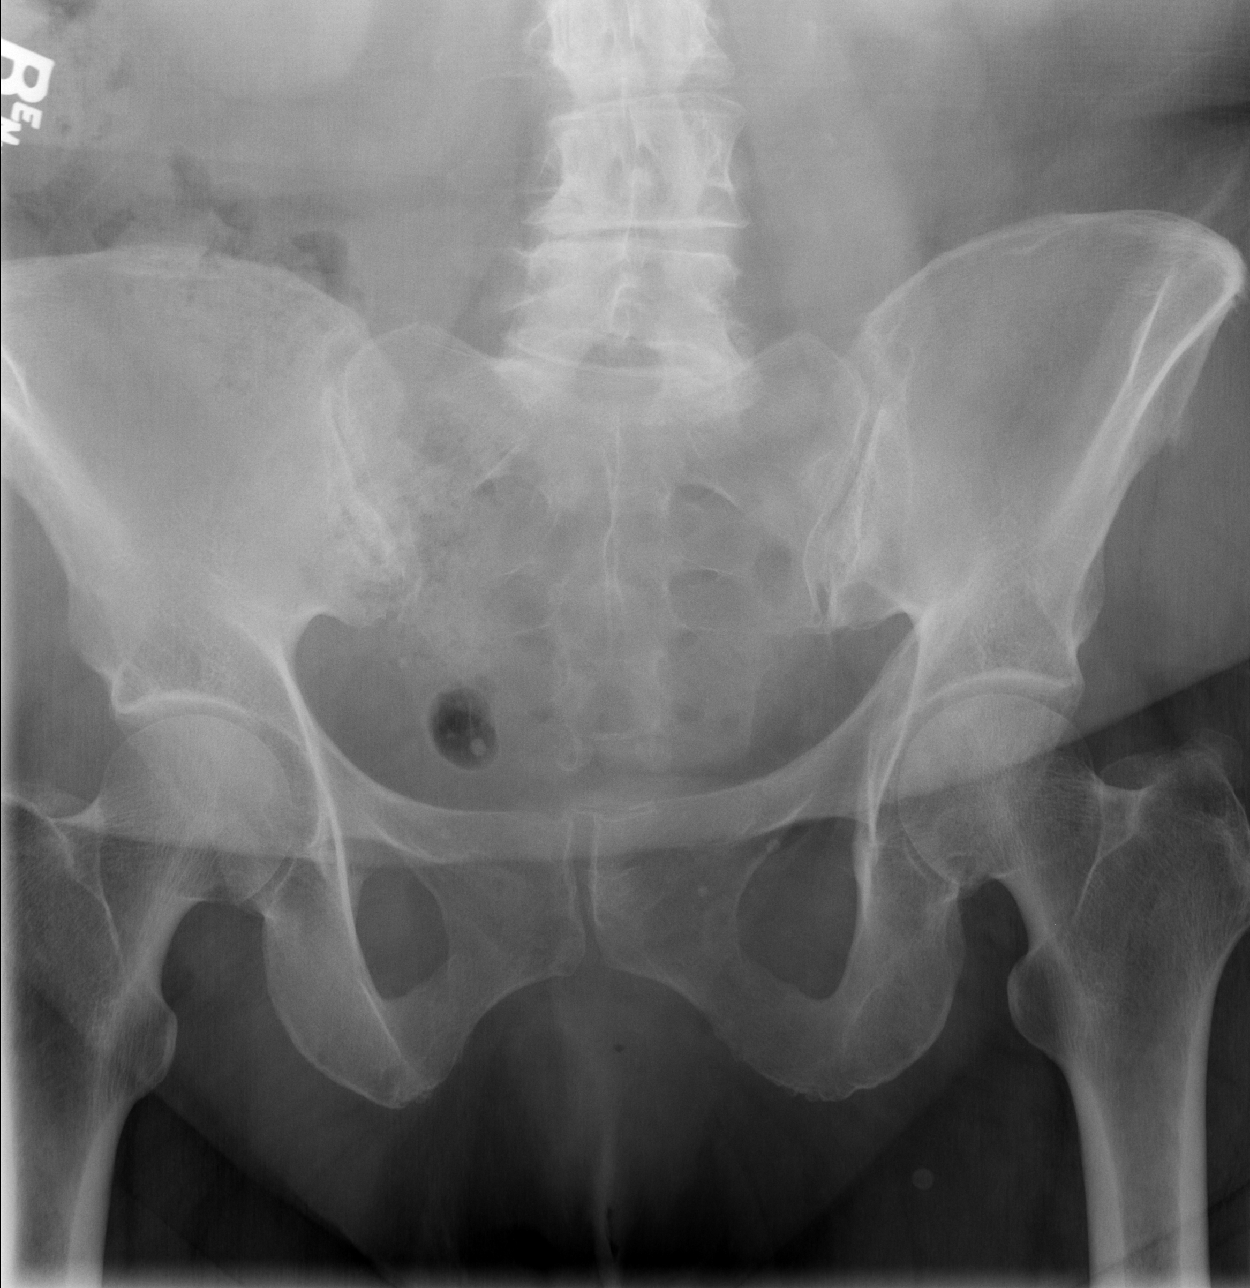

[t coccyx a.p.]
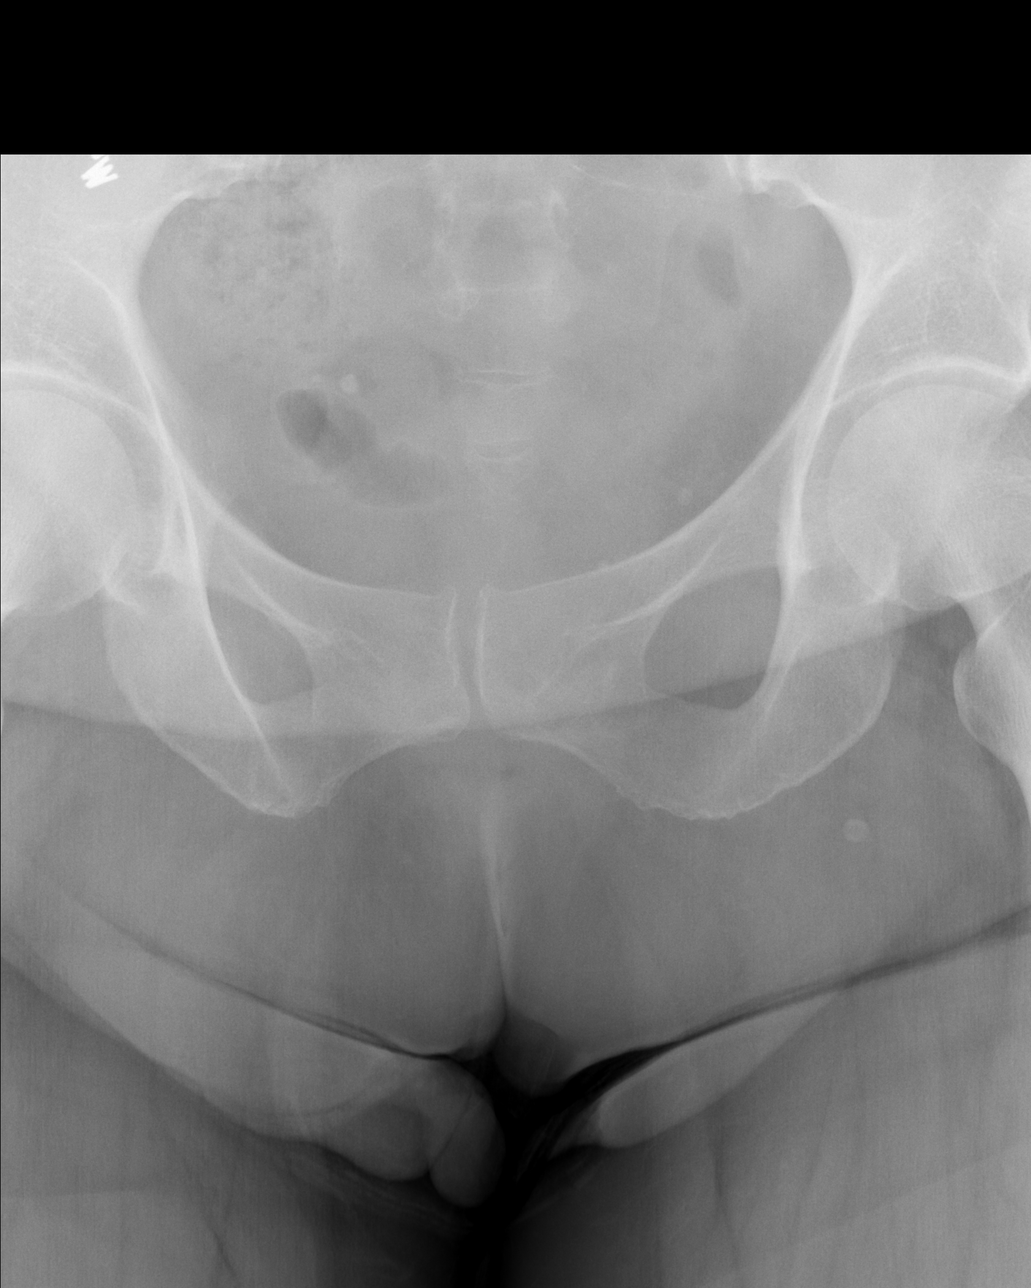

[t sacrum lat]
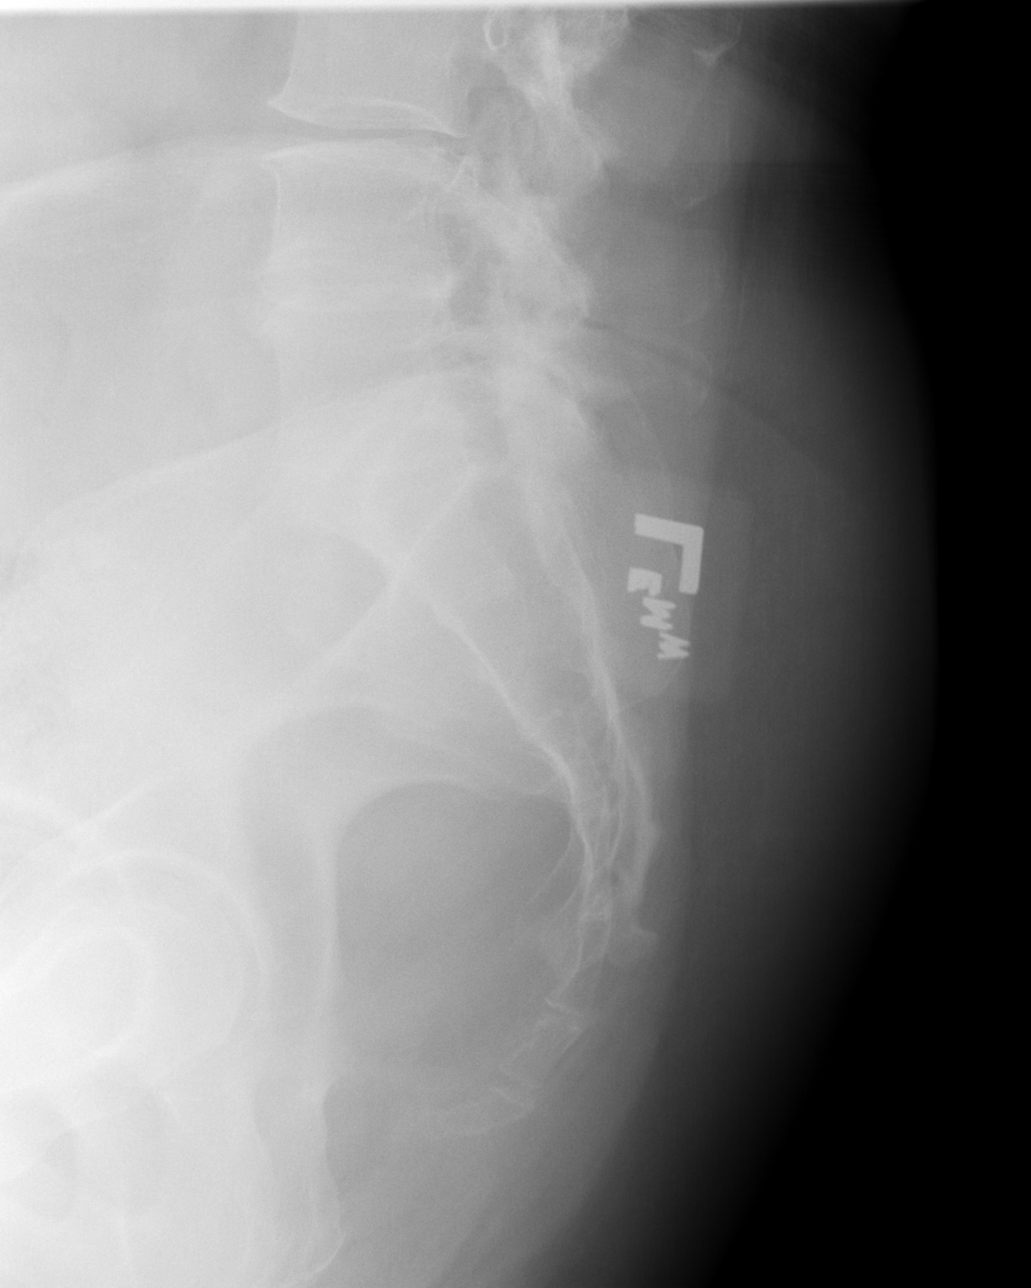

[3 of 3 positions shown; findings below may reference images not displayed]

FINDINGS: Degenerative disc disease changes and minimal levoconvex scoliosis
at visualized lower lumbar spine.

Bones appear demineralized.

Hip and SI joint spaces preserved.

No acute fracture, dislocation, or bone destruction.
IMPRESSION: No acute osseous abnormalities.

Degenerative disc disease changes and levoconvex scoliosis of the
lower lumbar spine.

## 2020-03-06 NOTE — Progress Notes (Signed)
Cardiology Office Note:   Date:  03/07/2020  NAME:  Kristin Delgado    MRN: [id number] DOB:  [birth year]   PCP:  [name], DO  Cardiologist:  No primary care provider on file.   Referring MD: Carollee Herter, Alferd Apa, *   Chief Complaint  Patient presents with  . Follow-up   History of Present Illness:   Kristin Delgado is a 65 y.o. female with a hx of HTN who presents for follow-up of chest pain. Echo normal. No CAD on CCTA. Aortic dilation 43 mm.  She seems to be doing well.  She is relieved to know that her cardiac CTA showed no evidence of CAD.  She does still get tightness in her chest which occur periodically.  They mainly occur during stressful situations such as ongoing discussion she is having with her daughter.  She reports that when she exercises or walks she does not get chest pain or tightness.  She reports she can walk 2.5 miles daily without any limitations such as chest pain or shortness of breath.  She denies any palpitations.  Symptoms all seem to be related to stress and anxiety.  I did discuss with her that her ascending aorta was 43 mm.  This was noted to be similar in the past.  I recommended a repeat CT scan in 1 year and if stable no further evaluation.  Problem List 1. 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 HTN  Past Medical History: Past Medical History:  Diagnosis Date  . Allergy    seasonal  . Anxiety    closed space like MRI  . Back pain \  . Chest pain 2006   admitted, essentially negative  . Constipation   . Depression   . GERD (gastroesophageal reflux disease)   . Hyperlipidemia   . Hypertension   . Infertility, female   . Joint pain   . Pituitary microadenoma ([place])   . Swallowing difficulty   . 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 Vitamin D deficiency     Past Surgical History: Past Surgical History:  Procedure Laterality Date  . 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 BRAIN SURGERY  1 / 1985   microadenoma  . COCHLEAR IMPLANT  02/2013   Left side    Current Medications: Current Meds  Medication Sig  . acetaminophen (TYLENOL ARTHRITIS PAIN) 650 MG CR tablet Take 650 mg by mouth every 8 (eight) hours as needed. pain   . Ascorbic Acid (VITAMIN C) 1000 MG tablet Take 1,000 mg by mouth 2 (two) times daily.    [name] atorvastatin (LIPITOR) 20 MG tablet TAKE 1 TABLET BY MOUTH EVERY DAY  . b complex vitamins tablet Take 1 tablet by mouth daily.  . bromocriptine (PARLODEL) 2.5 MG tablet Take 1 tablet (2.5 mg total) by mouth daily.  . Calcium Acetate, Phos Binder, (CALCIUM ACETATE PO) Take by mouth.  . Calcium Polycarbophil (FIBER-CAPS PO) Take by mouth.  . cholecalciferol (VITAMIN D3) 25 MCG (1000 UT) tablet Take 5,000 Units by mouth daily.  . ferrous sulfate 325 (65 FE) MG EC tablet Take 325 mg by mouth 3 (three) times daily with meals.  . hydrochlorothiazide (HYDRODIURIL) 12.5 MG tablet Take 1 tablet (12.5 mg total) by mouth daily.  [name] lisinopril (ZESTRIL) 10 MG tablet Take 0.5 tablets (5 mg total) by mouth daily.  . Multiple Vitamin (MULTIVITAMIN) tablet Take 1 tablet by mouth daily.    . Omega 3-6-9 Fatty Acids (OMEGA 3-6-9 COMPLEX  PO) Take by mouth.  . Omega-3 Fatty Acids (FISH OIL) 1200 MG CAPS Take 1 capsule by mouth 2 (two) times daily.  . Probiotic Product (PROBIOTIC ADVANCED PO) Take by mouth.  . ST JOHNS WORT PO Take by mouth.     Allergies:    Patient has no known allergies.   Social History: Social History   Socioeconomic History  . Marital status: Married    Spouse name: Annie Main  . Number of children: 3  . Years of education: Not on file  . Highest education level: Not on file  Occupational History  . 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 Smoking status: Never Smoker  . Smokeless tobacco: Never Used  Substance and Sexual Activity  . Alcohol use: No    Comment: rare  . Drug use: No  . Sexual activity: Not Currently    Partners: Male  Other Topics Concern  . Not on file  Social History Narrative    Exercise-- daily --- walking, 3x a week DVD   Social Determinants of Health   Financial Resource Strain:   . Difficulty of Paying Living Expenses:   Food Insecurity:   . Worried About Charity fundraiser in the Last Year:   . Arboriculturist in the Last Year:   Transportation Needs:   . Film/video editor (Medical):   [name] Lack of Transportation (Non-Medical):   Physical Activity:   . Days of Exercise per Week:   . Minutes of Exercise per Session:   Stress:   . Feeling of Stress :   Social Connections:   . Frequency of Communication with Friends and Family:   . Frequency of Social Gatherings with Friends and Family:   . Attends Religious Services:   . Active Member of Clubs or Organizations:   . Attends Archivist Meetings:   [name] Marital Status:      Family History: The patient's family history includes Alzheimer's disease in her father; Cancer (age of onset: 52) in her mother; Dementia in her father; Lung cancer in her mother.  ROS:   All other ROS reviewed and negative. Pertinent positives noted in the HPI.     EKGs/Labs/Other Studies Reviewed:   The following studies were personally reviewed by me today:  CCTA 12/28/2019 IMPRESSION: 1. Coronary calcium score of 0.  2. Mild ascending aorta dilation, measuring 35mm in largest diameter  3. Normal coronary origin with right dominance.  4. No evidence of CAD.  5. CAD-RADS 0. No evidence of CAD (0%). Consider non-atherosclerotic causes of chest pain.  TTE 12/13/2019 1. Left ventricular ejection fraction, by visual estimation, is 60 to  65%. The left ventricle has normal function. There is no left ventricular  hypertrophy.  2. The left ventricle has no regional wall motion abnormalities.  3. Normal GLS -22.  4. Global right ventricle has normal systolic function.The right  ventricular size is normal. No increase in right ventricular wall  thickness.  5. Left atrial size was normal.  6. Right  atrial size was normal.  7. The mitral valve is normal in structure. Trivial mitral valve  regurgitation. No evidence of mitral stenosis.  8. The tricuspid valve is normal in structure.  9. The tricuspid valve is normal in structure. Tricuspid valve  regurgitation is not demonstrated.  10. The aortic valve is tricuspid. Aortic valve regurgitation is mild.  Mild aortic valve sclerosis without stenosis.  11. The pulmonic valve was normal in structure. Pulmonic valve  regurgitation  is trivial.  12. Aortic dilatation noted.  13. There is mild dilatation of the aortic root measuring 41 mm.  14. Mildly elevated pulmonary artery systolic pressure.  15. The inferior vena cava is normal in size with greater than 50%  respiratory variability, suggesting right atrial pressure of 3 mmHg.   Recent Labs: 11/25/2019: Hemoglobin 13.4; Platelets 185 12/01/2019: TSH 2.340 02/13/2020: ALT 38; BUN 20; Creatinine, Ser 0.61; Potassium 4.0; Sodium 141   Recent Lipid Panel    Component Value Date/Time   CHOL 150 02/13/2020 1042   CHOL 173 12/07/2019 1439   TRIG 108.0 02/13/2020 1042   HDL 40.70 02/13/2020 1042   HDL 49 12/07/2019 1439   CHOLHDL 4 02/13/2020 1042   VLDL 21.6 02/13/2020 1042   LDLCALC 88 02/13/2020 1042   LDLCALC 105 (H) 12/07/2019 1439   LDLDIRECT 83.0 01/28/2017 1011    Physical Exam:   VS:  BP 132/80   Pulse 71   Ht 5\' 3"  (1.6 m)   Wt 170 lb 3.2 oz (77.2 kg)   LMP 06/10/2011   SpO2 99%   BMI 30.15 kg/m    Wt Readings from Last 3 Encounters:  03/07/20 170 lb 3.2 oz (77.2 kg)  02/15/20 165 lb (74.8 kg)  02/13/20 170 lb 9.6 oz (77.4 kg)    General: Well nourished, well developed, in no acute distress Heart: Atraumatic, normal size  Eyes: PEERLA, EOMI  Neck: Supple, no JVD Endocrine: No thryomegaly Cardiac: Normal S1, S2; RRR; no murmurs, rubs, or gallops Lungs: Clear to auscultation bilaterally, no wheezing, rhonchi or rales  Abd: Soft, nontender, no hepatomegaly  Ext:  No edema, pulses 2+ Musculoskeletal: No deformities, BUE and BLE strength normal and equal Skin: Warm and dry, no rashes   Neuro: Alert and oriented to person, place, time, and situation, CNII-XII grossly intact, no focal deficits  Psych: Normal mood and affect   ASSESSMENT:   Kristin Delgado is a 64 y.o. female who presents for the following: 1. Other chest pain   2. Aortic ectasia ([facility])   3. Thoracic aortic aneurysm without rupture ([place])     PLAN:   1. Other chest pain -Atypical.  Stress related.  Cardiac CTA with normal coronary arteries.  Echocardiogram normal.  No further work-up.  I have advised stress reduction strategies.  2. Aortic ectasia ([facility]) -Ascending aorta 43 mm.  We will repeat a CT scan in 1 year.  Suspect this is just hypertensive related.  Tricuspid aortic valve.  No concern for aortopathy.  If no change will monitor yearly.  Disposition: Return in about 1 year (around 03/07/2021).  Medication Adjustments/Labs and Tests Ordered: Current medicines are reviewed at length with the patient today.  Concerns regarding medicines are outlined above.  Orders Placed This Encounter  Procedures  . CT ANGIO CHEST AORTA W/CM & OR WO/CM   No orders of the defined types were placed in this encounter.   Patient Instructions  Medication Instructions:  The current medical regimen is effective;  continue present plan and medications.  *If you need a refill on your cardiac medications before your next appointment, please call your pharmacy*   Testing/Procedures: CT angio of Aorta    Follow-Up: At [facility], you and your health needs are our priority.  As part of our continuing mission to provide you with exceptional heart care, we have created designated Provider Care Teams.  These Care Teams include your primary Cardiologist (physician) and Advanced Practice Providers (APPs -  Physician Assistants and  Nurse Practitioners) who all work together to provide you with the care  you need, when you need it.  We recommend signing up for the patient portal called "MyChart".  Sign up information is provided on this After Visit Summary.  MyChart is used to connect with patients for Virtual Visits (Telemedicine).  Patients are able to view lab/test results, encounter notes, upcoming appointments, etc.  Non-urgent messages can be sent to your provider as well.   To learn more about what you can do with MyChart, go to [email or web address].    Your next appointment:   12 month(s)  The format for your next appointment:   In Person  Provider:   Eleonore Chiquito, MD         Time Spent with Patient: I have spent a total of 25 minutes with patient reviewing hospital notes, telemetry, EKGs, labs and examining the patient as well as establishing an assessment and plan that was discussed with the patient.  > 50% of time was spent in direct patient care.  Signed, Addison Naegeli. Audie Box, [place]  [address], [place], [place] [address] [phone]  03/07/2020 12:16 PM

## 2020-03-07 ENCOUNTER — Ambulatory Visit (INDEPENDENT_AMBULATORY_CARE_PROVIDER_SITE_OTHER): Payer: Medicare Other | Admitting: Cardiovascular Disease

## 2020-03-07 ENCOUNTER — Other Ambulatory Visit: Payer: Self-pay

## 2020-03-07 ENCOUNTER — Encounter: Payer: Self-pay | Admitting: Cardiovascular Disease

## 2020-03-07 VITALS — BP 132/80 | HR 71 | Ht 63.0 in | Wt 170.2 lb

## 2020-03-07 DIAGNOSIS — I77819 Aortic ectasia, unspecified site: Secondary | ICD-10-CM

## 2020-03-07 DIAGNOSIS — R0789 Other chest pain: Secondary | ICD-10-CM

## 2020-03-07 DIAGNOSIS — I712 Thoracic aortic aneurysm, without rupture, unspecified: Secondary | ICD-10-CM

## 2020-03-07 NOTE — Patient Instructions (Signed)
Medication Instructions:  The current medical regimen is effective;  continue present plan and medications.  *If you need a refill on your cardiac medications before your next appointment, please call your pharmacy*   Testing/Procedures: CT angio of Aorta    Follow-Up: At [facility], you and your health needs are our priority.  As part of our continuing mission to provide you with exceptional heart care, we have created designated Provider Care Teams.  These Care Teams include your primary Cardiologist (physician) and Advanced Practice Providers (APPs -  Physician Assistants and Nurse Practitioners) who all work together to provide you with the care you need, when you need it.  We recommend signing up for the patient portal called "MyChart".  Sign up information is provided on this After Visit Summary.  MyChart is used to connect with patients for Virtual Visits (Telemedicine).  Patients are able to view lab/test results, encounter notes, upcoming appointments, etc.  Non-urgent messages can be sent to your provider as well.   To learn more about what you can do with MyChart, go to [email or web address].    Your next appointment:   12 month(s)  The format for your next appointment:   In Person  Provider:   Eleonore Chiquito, MD

## 2020-03-14 ENCOUNTER — Encounter (INDEPENDENT_AMBULATORY_CARE_PROVIDER_SITE_OTHER): Payer: Self-pay | Admitting: Family Medicine

## 2020-03-14 ENCOUNTER — Ambulatory Visit (INDEPENDENT_AMBULATORY_CARE_PROVIDER_SITE_OTHER): Payer: Medicare Other | Admitting: Family Medicine

## 2020-03-14 ENCOUNTER — Other Ambulatory Visit: Payer: Self-pay

## 2020-03-14 VITALS — BP 147/77 | HR 62 | Temp 97.8°F | Ht 63.0 in | Wt 169.0 lb

## 2020-03-14 DIAGNOSIS — E669 Obesity, unspecified: Secondary | ICD-10-CM | POA: Diagnosis not present

## 2020-03-14 DIAGNOSIS — Z6830 Body mass index (BMI) 30.0-30.9, adult: Secondary | ICD-10-CM | POA: Diagnosis not present

## 2020-03-14 DIAGNOSIS — E785 Hyperlipidemia, unspecified: Secondary | ICD-10-CM | POA: Diagnosis not present

## 2020-03-14 DIAGNOSIS — I10 Essential (primary) hypertension: Secondary | ICD-10-CM

## 2020-03-14 MED ORDER — ATORVASTATIN CALCIUM 20 MG PO TABS: 20.0000 mg | ORAL_TABLET | Freq: Every day | ORAL | 0 refills | Status: DC

## 2020-03-14 MED ORDER — LISINOPRIL 10 MG PO TABS: 5.0000 mg | ORAL_TABLET | Freq: Every day | ORAL | 0 refills | Status: DC

## 2020-03-14 NOTE — Progress Notes (Signed)
Chief Complaint:   OBESITY Kristin Delgado is here to discuss her progress with her obesity treatment plan along with follow-up of her obesity related diagnoses. Kristin Delgado is keeping a food journal and adhering to recommended goals of 1050-1150 calories and 75 grams of protein and states she is following her eating plan approximately 50% of the time. Kristin Delgado states she is walking the dog 2.5 miles 4 times per week.  Today's visit was #: 86 Starting weight: 189 lbs Starting date: 10/12/2018 Today's weight: 169 lbs Today's date: 03/14/2020 Total lbs lost to date: 20 Total lbs lost since last in-office visit: 0  Interim History: Kristin Delgado has had a very stressful few weeks with her daughter demanding a car and has not been eating secondary to the stress. She has been journaling, but is struggling to get over 1000 calories. Occasionally she will go long stretches without eating and she doesn't think she has been drinking enough water. Kristin Delgado voices she has been eating out more in the past few weeks looking for cars for her daughter.  Subjective:   Essential hypertension. Blood pressure is slightly elevated today. No chest pain, chest pressure, or headache.  BP Readings from Last 3 Encounters:  03/14/20 (!) 147/77  03/07/20 132/80  02/15/20 (!) 149/83   Lab Results  Component Value Date   CREATININE 0.61 02/13/2020   CREATININE 0.78 11/25/2019   CREATININE 0.62 11/23/2019   Hyperlipidemia, unspecified hyperlipidemia type. No myalgias. Last LDL 88. Rosealie is on atorvastatin.  Lab Results  Component Value Date   CHOL 150 02/13/2020   HDL 40.70 02/13/2020   LDLCALC 88 02/13/2020   LDLDIRECT 83.0 01/28/2017   TRIG 108.0 02/13/2020   CHOLHDL 4 02/13/2020   Lab Results  Component Value Date   ALT 38 (H) 02/13/2020   AST 22 02/13/2020   ALKPHOS 50 02/13/2020   BILITOT 0.5 02/13/2020   The 10-year ASCVD risk score Mikey Bussing DC Jr., et al., 2013) is: 9.5%   Values used to calculate  the score:     Age: 64 years     Sex: Female     Is Non-Hispanic African American: No     Diabetic: No     Tobacco smoker: No     Systolic Blood Pressure: [id number] mmHg     Is BP treated: Yes     HDL Cholesterol: 40.7 mg/dL     Total Cholesterol: 150 mg/dL  Assessment/Plan:   Essential hypertension. Evarose is working on healthy weight loss and exercise to improve blood pressure control. We will watch for signs of hypotension as she continues her lifestyle modifications. Refill was given for lisinopril (ZESTRIL) 5 MG tablet PO daily #30 with 0 refills.  Hyperlipidemia, unspecified hyperlipidemia type. Cardiovascular risk and specific lipid/LDL goals reviewed.  We discussed several lifestyle modifications today and Sharlynn will continue to work on diet, exercise and weight loss efforts. Orders and follow up as documented in patient record. Refill was given for atorvastatin (LIPITOR) 20 MG tablet PO daily #30 with 0 refills.  Counseling Intensive lifestyle modifications are the first line treatment for this issue. . Dietary changes: Increase soluble fiber. Decrease simple carbohydrates. . Exercise changes: Moderate to vigorous-intensity aerobic activity 150 minutes per week if tolerated. . Lipid-lowering medications: see documented in medical record.    Class 1 obesity with serious comorbidity and body mass index (BMI) of 30.0 to 30.9 in adult, unspecified obesity type - BMI greater than 30 at start of program.   Kristin Delgado is  currently in the action stage of change. As such, her goal is to continue with weight loss efforts. She has agreed to keeping a food journal and adhering to recommended goals of 1050-1150 calories and 75+ grams of protein daily.   Exercise goals: Kristin Delgado will continue her current exercise regimen.  Behavioral modification strategies: increasing lean protein intake, increasing vegetables, meal planning and cooking strategies, keeping healthy foods in the home and planning for  success.  Kristin Delgado has agreed to follow-up with our clinic in 3-4 weeks. She was informed of the importance of frequent follow-up visits to maximize her success with intensive lifestyle modifications for her multiple health conditions.   Objective:   Blood pressure (!) 147/77, pulse 62, temperature 97.8 F (36.6 C), temperature source Oral, height 5\' 3"  (1.6 m), weight 169 lb (76.7 kg), last menstrual period 06/10/2011, SpO2 100 %. Body mass index is 29.94 kg/m.  General: Cooperative, alert, well developed, in no acute distress. HEENT: Conjunctivae and lids unremarkable. Cardiovascular: Regular rhythm.  Lungs: Normal work of breathing. Neurologic: No focal deficits.   Lab Results  Component Value Date   CREATININE 0.61 02/13/2020   BUN 20 02/13/2020   NA 141 02/13/2020   K 4.0 02/13/2020   CL 103 02/13/2020   CO2 33 (H) 02/13/2020   Lab Results  Component Value Date   ALT 38 (H) 02/13/2020   AST 22 02/13/2020   ALKPHOS 50 02/13/2020   BILITOT 0.5 02/13/2020   Lab Results  Component Value Date   HGBA1C 5.4 11/23/2019   HGBA1C 5.2 06/22/2019   HGBA1C 5.3 01/23/2019   HGBA1C 5.6 10/12/2018   HGBA1C 6.1 01/28/2017   Lab Results  Component Value Date   INSULIN 10.3 11/23/2019   INSULIN 6.7 06/22/2019   INSULIN 6.2 01/23/2019   INSULIN 5.9 10/12/2018   Lab Results  Component Value Date   TSH 2.340 12/01/2019   Lab Results  Component Value Date   CHOL 150 02/13/2020   HDL 40.70 02/13/2020   LDLCALC 88 02/13/2020   LDLDIRECT 83.0 01/28/2017   TRIG 108.0 02/13/2020   CHOLHDL 4 02/13/2020   Lab Results  Component Value Date   WBC 8.3 11/25/2019   HGB 13.4 11/25/2019   HCT 41.9 11/25/2019   MCV 79.8 (L) 11/25/2019   PLT 185 11/25/2019   Lab Results  Component Value Date   IRON 52 12/12/2007   Obesity Behavioral Intervention Documentation for Insurance:   Approximately 15 minutes were spent on the discussion below.  ASK: We discussed the diagnosis of  obesity with Kristin Delgado today and Kristin Delgado agreed to give [name] permission to discuss obesity behavioral modification therapy today.  ASSESS: Kristin Delgado has the diagnosis of obesity and her BMI today is 30.0. Kristin Delgado is in the action stage of change.   ADVISE: Mccauley was educated on the multiple health risks of obesity as well as the benefit of weight loss to improve her health. She was advised of the need for long term treatment and the importance of lifestyle modifications to improve her current health and to decrease her risk of future health problems.  AGREE: Multiple dietary modification options and treatment options were discussed and [place] agreed to follow the recommendations documented in the above note.  ARRANGE: Layona was educated on the importance of frequent visits to treat obesity as outlined per CMS and USPSTF guidelines and agreed to schedule her next follow up appointment today.  Attestation Statements:   Reviewed by clinician on day of visit: allergies, medications, problem  list, medical history, surgical history, family history, social history, and previous encounter notes.  I, Michaelene Song, am acting as transcriptionist for Coralie Common, MD   I have reviewed the above documentation for accuracy and completeness, and I agree with the above. - Jinny Blossom, MD

## 2020-04-05 ENCOUNTER — Other Ambulatory Visit (INDEPENDENT_AMBULATORY_CARE_PROVIDER_SITE_OTHER): Payer: Self-pay | Admitting: Family Medicine

## 2020-04-05 DIAGNOSIS — I10 Essential (primary) hypertension: Secondary | ICD-10-CM

## 2020-04-11 ENCOUNTER — Ambulatory Visit (INDEPENDENT_AMBULATORY_CARE_PROVIDER_SITE_OTHER): Payer: Medicare Other | Admitting: Family Medicine

## 2020-04-11 ENCOUNTER — Other Ambulatory Visit: Payer: Self-pay

## 2020-04-11 ENCOUNTER — Encounter (INDEPENDENT_AMBULATORY_CARE_PROVIDER_SITE_OTHER): Payer: Self-pay | Admitting: Family Medicine

## 2020-04-11 VITALS — BP 125/76 | HR 58 | Temp 97.4°F | Ht 63.0 in | Wt 168.0 lb

## 2020-04-11 DIAGNOSIS — I10 Essential (primary) hypertension: Secondary | ICD-10-CM | POA: Diagnosis not present

## 2020-04-11 DIAGNOSIS — Z6830 Body mass index (BMI) 30.0-30.9, adult: Secondary | ICD-10-CM | POA: Diagnosis not present

## 2020-04-11 DIAGNOSIS — E669 Obesity, unspecified: Secondary | ICD-10-CM | POA: Diagnosis not present

## 2020-04-11 DIAGNOSIS — K5909 Other constipation: Secondary | ICD-10-CM

## 2020-04-11 MED ORDER — HYDROCHLOROTHIAZIDE 12.5 MG PO TABS: 12.5000 mg | ORAL_TABLET | Freq: Every day | ORAL | 0 refills | Status: DC

## 2020-04-11 NOTE — Progress Notes (Signed)
Chief Complaint:   OBESITY Kristin Delgado is here to discuss her progress with her obesity treatment plan along with follow-up of her obesity related diagnoses. Kristin Delgado is on keeping a food journal and adhering to recommended goals of 1050-1150 calories and 75+ grams of protein and states she is following her eating plan approximately 100% of the time. Kristin Delgado states she is walking 1-1/4 to 2 miles 7 times per week.  Today's visit was #: 30 Starting weight: 189 lbs Starting date: 10/12/2018 Today's weight: 168 lbs Today's date: 04/11/2020 Total lbs lost to date: 21 lbs Total lbs lost since last in-office visit: 1 lb  Interim History: Kristin Delgado says that the last few weeks have only been minimally less stressful with her daughter's car.  She has been keeping a food journal even with increased stress.  She tends not to eat when she is under stress.  She works on getting all calories left in allotment.  She has been baking and that is helping calories because she is getting a good portion of lean meat in.  She is mostly getting in 1000-1100 calories with 80 grams of protein.  Subjective:   1. Essential hypertension Review: taking medications as instructed, no medication side effects noted, no chest pain on exertion, no dyspnea on exertion, no swelling of ankles.  Blood pressure is well-controlled.  She is taking HCTZ.  BP Readings from Last 3 Encounters:  04/11/20 125/76  03/14/20 (!) 147/77  03/07/20 132/80   2. Other constipation [name] notes constipation. She states she has a BM every 3-5 days.  She is trying to increase her fiber.   Assessment/Plan:   1. Essential hypertension Mason is working on healthy weight loss and exercise to improve blood pressure control. We will watch for signs of hypotension as she continues her lifestyle modifications. - hydrochlorothiazide (HYDRODIURIL) 12.5 MG tablet; Take 1 tablet (12.5 mg total) by mouth daily.  Dispense: 30 tablet; Refill: 0  2.  Other constipation Start MiraLax daily.  [name] was informed that a decrease in bowel movement frequency is normal while losing weight, but stools should not be hard or painful. Orders and follow up as documented in patient record.   Counseling Getting to Good Bowel Health: Your goal is to have one soft bowel movement each day. Drink at least 8 glasses of water each day. Eat plenty of fiber (goal is over 25 grams each day). It is best to get most of your fiber from dietary sources which includes leafy green vegetables, fresh fruit, and whole grains. You may need to add fiber with the help of OTC fiber supplements. These include Metamucil, Citrucel, and Flaxseed. If you are still having trouble, try adding Miralax or Magnesium Citrate. If all of these changes do not work, Cabin crew.  3. Class 1 obesity with serious comorbidity and body mass index (BMI) of 30.0 to 30.9 in adult, unspecified obesity type Kristin Delgado is currently in the action stage of change. As such, her goal is to continue with weight loss efforts. She has agreed to keeping a food journal and adhering to recommended goals of 1050-1150 calories and 75+ grams of protein.   Exercise goals: As is.  Behavioral modification strategies: increasing lean protein intake, meal planning and cooking strategies, planning for success and keeping a strict food journal.  Kristin Delgado has agreed to follow-up with our clinic in 4 weeks. She was informed of the importance of frequent follow-up visits to maximize her success with intensive lifestyle modifications  for her multiple health conditions.   Objective:   Blood pressure 125/76, pulse (!) 58, temperature (!) 97.4 F (36.3 C), temperature source Oral, height 5\' 3"  (1.6 m), weight 168 lb (76.2 kg), last menstrual period 06/10/2011, SpO2 98 %. Body mass index is 29.76 kg/m.  General: Cooperative, alert, well developed, in no acute distress. HEENT: Conjunctivae and lids unremarkable.  Cardiovascular: Regular rhythm.  Lungs: Normal work of breathing. Neurologic: No focal deficits.   Lab Results  Component Value Date   CREATININE 0.61 02/13/2020   BUN 20 02/13/2020   NA 141 02/13/2020   K 4.0 02/13/2020   CL 103 02/13/2020   CO2 33 (H) 02/13/2020   Lab Results  Component Value Date   ALT 38 (H) 02/13/2020   AST 22 02/13/2020   ALKPHOS 50 02/13/2020   BILITOT 0.5 02/13/2020   Lab Results  Component Value Date   HGBA1C 5.4 11/23/2019   HGBA1C 5.2 06/22/2019   HGBA1C 5.3 01/23/2019   HGBA1C 5.6 10/12/2018   HGBA1C 6.1 01/28/2017   Lab Results  Component Value Date   INSULIN 10.3 11/23/2019   INSULIN 6.7 06/22/2019   INSULIN 6.2 01/23/2019   INSULIN 5.9 10/12/2018   Lab Results  Component Value Date   TSH 2.340 12/01/2019   Lab Results  Component Value Date   CHOL 150 02/13/2020   HDL 40.70 02/13/2020   LDLCALC 88 02/13/2020   LDLDIRECT 83.0 01/28/2017   TRIG 108.0 02/13/2020   CHOLHDL 4 02/13/2020   Lab Results  Component Value Date   WBC 8.3 11/25/2019   HGB 13.4 11/25/2019   HCT 41.9 11/25/2019   MCV 79.8 (L) 11/25/2019   PLT 185 11/25/2019   Lab Results  Component Value Date   IRON 52 12/12/2007   Obesity Behavioral Intervention Documentation for Insurance:   Approximately 15 minutes were spent on the discussion below.  ASK: We discussed the diagnosis of obesity with Kristin Delgado today and Kristin Delgado agreed to give [name] permission to discuss obesity behavioral modification therapy today.  ASSESS: Kristin Delgado has the diagnosis of obesity and her BMI today is 29.9. Kristin Delgado is in the action stage of change.   ADVISE: Kristin Delgado was educated on the multiple health risks of obesity as well as the benefit of weight loss to improve her health. She was advised of the need for long term treatment and the importance of lifestyle modifications to improve her current health and to decrease her risk of future health problems.  AGREE: Multiple dietary  modification options and treatment options were discussed and Kristin Delgado agreed to follow the recommendations documented in the above note.  ARRANGE: Kristin Delgado was educated on the importance of frequent visits to treat obesity as outlined per CMS and USPSTF guidelines and agreed to schedule her next follow up appointment today.  Attestation Statements:   Reviewed by clinician on day of visit: allergies, medications, problem list, medical history, surgical history, family history, social history, and previous encounter notes.  I, Water quality scientist, CMA, am acting as transcriptionist for Coralie Common, MD.  I have reviewed the above documentation for accuracy and completeness, and I agree with the above. - Jinny Blossom, MD

## 2020-05-09 ENCOUNTER — Other Ambulatory Visit: Payer: Self-pay

## 2020-05-09 ENCOUNTER — Encounter (INDEPENDENT_AMBULATORY_CARE_PROVIDER_SITE_OTHER): Payer: Self-pay | Admitting: Family Medicine

## 2020-05-09 ENCOUNTER — Ambulatory Visit (INDEPENDENT_AMBULATORY_CARE_PROVIDER_SITE_OTHER): Payer: Medicare Other | Admitting: Family Medicine

## 2020-05-09 VITALS — BP 152/88 | HR 60 | Temp 98.0°F | Ht 63.0 in | Wt 169.0 lb

## 2020-05-09 DIAGNOSIS — I10 Essential (primary) hypertension: Secondary | ICD-10-CM

## 2020-05-09 DIAGNOSIS — E669 Obesity, unspecified: Secondary | ICD-10-CM | POA: Diagnosis not present

## 2020-05-09 DIAGNOSIS — R0602 Shortness of breath: Secondary | ICD-10-CM | POA: Diagnosis not present

## 2020-05-09 DIAGNOSIS — Z6830 Body mass index (BMI) 30.0-30.9, adult: Secondary | ICD-10-CM

## 2020-05-09 DIAGNOSIS — F418 Other specified anxiety disorders: Secondary | ICD-10-CM

## 2020-05-09 MED ORDER — HYDROCHLOROTHIAZIDE 12.5 MG PO TABS: 12.5000 mg | ORAL_TABLET | Freq: Every day | ORAL | 0 refills | Status: DC

## 2020-05-09 MED ORDER — SERTRALINE HCL 25 MG PO TABS: 25.0000 mg | ORAL_TABLET | Freq: Every day | ORAL | 0 refills | Status: DC

## 2020-05-13 NOTE — Progress Notes (Signed)
Chief Complaint:   OBESITY Kristin Delgado is here to discuss her progress with her obesity treatment plan along with follow-up of her obesity related diagnoses. Kristin Delgado is on keeping a food journal and adhering to recommended goals of 1050-1150 calories and 75+ grams of protein daily and states she is following her eating plan approximately 100% of the time. Kristin Delgado states she is walking the dog for 1.5 miles 7 times per week.  Today's visit was #: 52 Starting weight: 189 lbs Starting date: 10/12/2018 Today's weight: 169 lbs Today's date: 05/09/2020 Total lbs lost to date: 20 Total lbs lost since last in-office visit: 0  Interim History: Kristin Delgado states she is easily reaching her protein and reaching her calories of 1,000 all days, and only around 50 % of the time she is going over 1100. Her average calories this past week is 979 calories with 81 grams of protein.  Subjective:   1. Essential hypertension Kristin Delgado's blood pressure is elevated today. She denies headaches, chest pain, or chest pressure. She is on 5 mg of lisinopril and 12.5 mh of hydrochlorothiazide.  2. SOB (shortness of breath) on exertion Kristin Delgado notes minimal improvement since her last IC. She is only occasionally limited in terms of activity.  3. Depression with anxiety Kristin Delgado is to stay off bromocriptine, as she thinks this significantly alters her ability to cope. She notes significant recent life stressors.  Assessment/Plan:   1. Essential hypertension Kristin Delgado is working on healthy weight loss and exercise to improve blood pressure control. We will watch for signs of hypotension as she continues her lifestyle modifications. Kristin Delgado agreed to increase lisinopril to 10 mg (she is currently cutting her medication in half), and we will refill hydrochlorothiazide for 1 month.  - hydrochlorothiazide (HYDRODIURIL) 12.5 MG tablet; Take 1 tablet (12.5 mg total) by mouth daily.  Dispense: 30 tablet; Refill: 0  2. SOB  (shortness of breath) on exertion We will recheck IC today, and Kristin Delgado will follow up as directed.  3. Depression with anxiety Behavior modification techniques were discussed today to help Kristin Delgado deal with her depression and anxiety. Kristin Delgado agreed to start Zoloft 25 mg daily with no refills. We will follow up at her next appointment. Orders and follow up as documented in patient record.   - sertraline (ZOLOFT) 25 MG tablet; Take 1 tablet (25 mg total) by mouth daily.  Dispense: 30 tablet; Refill: 0  4. Class 1 obesity with serious comorbidity and body mass index (BMI) of 30.0 to 30.9 in adult, unspecified obesity type Kristin Delgado is currently in the action stage of change. As such, her goal is to continue with weight loss efforts. She has agreed to keeping a food journal and adhering to recommended goals of 1000-1100 calories and 70+ grams of protein daily.   Exercise goals: As is, resistance training for 10-15 minutes 2 times per week.  Behavioral modification strategies: increasing lean protein intake, meal planning and cooking strategies, keeping healthy foods in the home and planning for success.  Kristin Delgado has agreed to follow-up with our clinic in 3 to 4 weeks. She was informed of the importance of frequent follow-up visits to maximize her success with intensive lifestyle modifications for her multiple health conditions.   Objective:   Blood pressure (!) 152/88, pulse 60, temperature 98 F (36.7 C), temperature source Oral, height 5\' 3"  (1.6 m), weight 169 lb (76.7 kg), last menstrual period 06/10/2011, SpO2 99 %. Body mass index is 29.94 kg/m.  General: Cooperative, alert, well developed,  in no acute distress. HEENT: Conjunctivae and lids unremarkable. Cardiovascular: Regular rhythm.  Lungs: Normal work of breathing. Neurologic: No focal deficits.   Lab Results  Component Value Date   CREATININE 0.61 02/13/2020   BUN 20 02/13/2020   NA 141 02/13/2020   K 4.0 02/13/2020   CL  103 02/13/2020   CO2 33 (H) 02/13/2020   Lab Results  Component Value Date   ALT 38 (H) 02/13/2020   AST 22 02/13/2020   ALKPHOS 50 02/13/2020   BILITOT 0.5 02/13/2020   Lab Results  Component Value Date   HGBA1C 5.4 11/23/2019   HGBA1C 5.2 06/22/2019   HGBA1C 5.3 01/23/2019   HGBA1C 5.6 10/12/2018   HGBA1C 6.1 01/28/2017   Lab Results  Component Value Date   INSULIN 10.3 11/23/2019   INSULIN 6.7 06/22/2019   INSULIN 6.2 01/23/2019   INSULIN 5.9 10/12/2018   Lab Results  Component Value Date   TSH 2.340 12/01/2019   Lab Results  Component Value Date   CHOL 150 02/13/2020   HDL 40.70 02/13/2020   LDLCALC 88 02/13/2020   LDLDIRECT 83.0 01/28/2017   TRIG 108.0 02/13/2020   CHOLHDL 4 02/13/2020   Lab Results  Component Value Date   WBC 8.3 11/25/2019   HGB 13.4 11/25/2019   HCT 41.9 11/25/2019   MCV 79.8 (L) 11/25/2019   PLT 185 11/25/2019   Lab Results  Component Value Date   IRON 52 12/12/2007    Obesity Behavioral Intervention Documentation for Insurance:   Approximately 15 minutes were spent on the discussion below.  ASK: We discussed the diagnosis of obesity with Kristin Delgado today and Kristin Delgado agreed to give [name] permission to discuss obesity behavioral modification therapy today.  ASSESS: Kristin Delgado has the diagnosis of obesity and her BMI today is 29.94. Kristin Delgado is in the action stage of change.   ADVISE: Kristin Delgado was educated on the multiple health risks of obesity as well as the benefit of weight loss to improve her health. She was advised of the need for long term treatment and the importance of lifestyle modifications to improve her current health and to decrease her risk of future health problems.  AGREE: Multiple dietary modification options and treatment options were discussed and Kristin Delgado agreed to follow the recommendations documented in the above note.  ARRANGE: Allisen was educated on the importance of frequent visits to treat obesity as outlined  per CMS and USPSTF guidelines and agreed to schedule her next follow up appointment today.  Attestation Statements:   Reviewed by clinician on day of visit: allergies, medications, problem list, medical history, surgical history, family history, social history, and previous encounter notes.   I, Trixie Dredge, am acting as transcriptionist for Coralie Common, MD.  I have reviewed the above documentation for accuracy and completeness, and I agree with the above. - Jinny Blossom, MD

## 2020-05-29 ENCOUNTER — Other Ambulatory Visit (INDEPENDENT_AMBULATORY_CARE_PROVIDER_SITE_OTHER): Payer: Self-pay | Admitting: Family Medicine

## 2020-05-29 DIAGNOSIS — F418 Other specified anxiety disorders: Secondary | ICD-10-CM

## 2020-06-06 ENCOUNTER — Other Ambulatory Visit: Payer: Self-pay

## 2020-06-06 ENCOUNTER — Ambulatory Visit (INDEPENDENT_AMBULATORY_CARE_PROVIDER_SITE_OTHER): Payer: Medicare Other | Admitting: Family Medicine

## 2020-06-06 ENCOUNTER — Encounter (INDEPENDENT_AMBULATORY_CARE_PROVIDER_SITE_OTHER): Payer: Self-pay | Admitting: Family Medicine

## 2020-06-06 VITALS — BP 130/73 | HR 62 | Temp 98.1°F | Ht 63.0 in | Wt 169.0 lb

## 2020-06-06 DIAGNOSIS — E669 Obesity, unspecified: Secondary | ICD-10-CM | POA: Diagnosis not present

## 2020-06-06 DIAGNOSIS — E8881 Metabolic syndrome: Secondary | ICD-10-CM

## 2020-06-06 DIAGNOSIS — Z6830 Body mass index (BMI) 30.0-30.9, adult: Secondary | ICD-10-CM | POA: Diagnosis not present

## 2020-06-06 DIAGNOSIS — I10 Essential (primary) hypertension: Secondary | ICD-10-CM

## 2020-06-06 DIAGNOSIS — F3289 Other specified depressive episodes: Secondary | ICD-10-CM | POA: Diagnosis not present

## 2020-06-06 MED ORDER — HYDROCHLOROTHIAZIDE 12.5 MG PO TABS: 12.5000 mg | ORAL_TABLET | Freq: Every day | ORAL | 0 refills | Status: DC

## 2020-06-06 MED ORDER — SERTRALINE HCL 25 MG PO TABS: 25.0000 mg | ORAL_TABLET | Freq: Every day | ORAL | 0 refills | Status: DC

## 2020-06-06 MED ORDER — METFORMIN HCL 500 MG PO TABS: 500.0000 mg | ORAL_TABLET | Freq: Every day | ORAL | 0 refills | Status: DC

## 2020-06-10 NOTE — Progress Notes (Signed)
Chief Complaint:   OBESITY Kristin Delgado is here to discuss her progress with her obesity treatment plan along with follow-up of her obesity related diagnoses. Kristin Delgado is on keeping a food journal and adhering to recommended goals of 1000-1100 calories and 70+ grams of protein daily and states she is following her eating plan approximately 100% of the time. Kristin Delgado states she is walking the dog for 1.5 miles 7 times per week.  Today's visit was #: 59 Starting weight: 189 lbs Starting date: 10/12/2018 Today's weight: 169 lbs Today's date: 06/06/2020 Total lbs lost to date: 20 Total lbs lost since last in-office visit: 0  Interim History: Kristin Delgado is taking life day by day. Her daughter and her relationship has been relatively ok. She had a long discussion with her daughter and her husband and that has helped with stress at home. journaling wise she feels she is hitting calories most days (went over 1 day and under calories rarely), and she is easily getting over her protein goal.  Subjective:   1. Insulin resistance Kristin Delgado is not on medications currently. She notes minimal carbohydrate cravings.  2. Essential hypertension Kristin Delgado's blood pressure is well controlled today. She denies chest pain, chest pressure, or headaches.  3. Other depression, with emotional eating Kristin Delgado denies suicidal ideas or homicidal ideas. Her symptoms are somewhat improved with sertraline. She has on been on sertraline for 1 month.  Assessment/Plan:   1. Insulin resistance Kristin Delgado will continue to work on weight loss, exercise, and decreasing simple carbohydrates to help decrease the risk of diabetes. Kristin Delgado agreed to start metformin 500 mg PO daily with no refills. Kristin Delgado agreed to follow-up with [name] as directed to closely monitor her progress.  - metFORMIN (GLUCOPHAGE) 500 MG tablet; Take 1 tablet (500 mg total) by mouth daily with breakfast.  Dispense: 30 tablet; Refill: 0  2. Essential  hypertension Kristin Delgado is working on healthy weight loss and exercise to improve blood pressure control. We will watch for signs of hypotension as she continues her lifestyle modifications. Kristin Delgado will continue her medications, and we will refill hydrochlorothiazide for 1 month.  - hydrochlorothiazide (HYDRODIURIL) 12.5 MG tablet; Take 1 tablet (12.5 mg total) by mouth daily.  Dispense: 30 tablet; Refill: 0  3. Other depression, with emotional eating Behavior modification techniques were discussed today to help Kristin Delgado deal with her emotional/non-hunger eating behaviors. We will refill sertraline for 1 month. Orders and follow up as documented in patient record.   - sertraline (ZOLOFT) 25 MG tablet; Take 1 tablet (25 mg total) by mouth daily.  Dispense: 30 tablet; Refill: 0  4. Class 1 obesity with serious comorbidity and body mass index (BMI) of 30.0 to 30.9 in adult, unspecified obesity type Kristin Delgado is currently in the action stage of change. As such, her goal is to continue with weight loss efforts. She has agreed to keeping a food journal and adhering to recommended goals of 1000-1100 calories and 70+ grams of protein daily.   Exercise goals: All adults should avoid inactivity. Some physical activity is better than none, and adults who participate in any amount of physical activity gain some health benefits. She is to add in resistance training for 10 minutes 3 times per week.  Behavioral modification strategies: increasing lean protein intake, meal planning and cooking strategies, keeping healthy foods in the home and keeping a strict food journal.  Kristin Delgado has agreed to follow-up with our clinic in 4 weeks. She was informed of the importance of frequent follow-up  visits to maximize her success with intensive lifestyle modifications for her multiple health conditions.   Objective:   Blood pressure 130/73, pulse 62, temperature 98.1 F (36.7 C), temperature source Oral, height 5\' 3"  (1.6 m),  weight 169 lb (76.7 kg), last menstrual period 06/10/2011, SpO2 97 %. Body mass index is 29.94 kg/m.  General: Cooperative, alert, well developed, in no acute distress. HEENT: Conjunctivae and lids unremarkable. Cardiovascular: Regular rhythm.  Lungs: Normal work of breathing. Neurologic: No focal deficits.   Lab Results  Component Value Date   CREATININE 0.61 02/13/2020   BUN 20 02/13/2020   NA 141 02/13/2020   K 4.0 02/13/2020   CL 103 02/13/2020   CO2 33 (H) 02/13/2020   Lab Results  Component Value Date   ALT 38 (H) 02/13/2020   AST 22 02/13/2020   ALKPHOS 50 02/13/2020   BILITOT 0.5 02/13/2020   Lab Results  Component Value Date   HGBA1C 5.4 11/23/2019   HGBA1C 5.2 06/22/2019   HGBA1C 5.3 01/23/2019   HGBA1C 5.6 10/12/2018   HGBA1C 6.1 01/28/2017   Lab Results  Component Value Date   INSULIN 10.3 11/23/2019   INSULIN 6.7 06/22/2019   INSULIN 6.2 01/23/2019   INSULIN 5.9 10/12/2018   Lab Results  Component Value Date   TSH 2.340 12/01/2019   Lab Results  Component Value Date   CHOL 150 02/13/2020   HDL 40.70 02/13/2020   LDLCALC 88 02/13/2020   LDLDIRECT 83.0 01/28/2017   TRIG 108.0 02/13/2020   CHOLHDL 4 02/13/2020   Lab Results  Component Value Date   WBC 8.3 11/25/2019   HGB 13.4 11/25/2019   HCT 41.9 11/25/2019   MCV 79.8 (L) 11/25/2019   PLT 185 11/25/2019   Lab Results  Component Value Date   IRON 52 12/12/2007    Obesity Behavioral Intervention Documentation for Insurance:   Approximately 15 minutes were spent on the discussion below.  ASK: We discussed the diagnosis of obesity with Kristin Delgado today and Kristin Delgado agreed to give [name] permission to discuss obesity behavioral modification therapy today.  ASSESS: Kristin Delgado has the diagnosis of obesity and her BMI today is 29.94. Kristin Delgado is in the action stage of change.   ADVISE: Kristin Delgado was educated on the multiple health risks of obesity as well as the benefit of weight loss to improve her  health. She was advised of the need for long term treatment and the importance of lifestyle modifications to improve her current health and to decrease her risk of future health problems.  AGREE: Multiple dietary modification options and treatment options were discussed and Kristin Delgado agreed to follow the recommendations documented in the above note.  ARRANGE: Kristin Delgado was educated on the importance of frequent visits to treat obesity as outlined per CMS and USPSTF guidelines and agreed to schedule her next follow up appointment today.  Attestation Statements:   Reviewed by clinician on day of visit: allergies, medications, problem list, medical history, surgical history, family history, social history, and previous encounter notes.   I, Trixie Dredge, am acting as transcriptionist for Coralie Common, MD.  I have reviewed the above documentation for accuracy and completeness, and I agree with the above. - Jinny Blossom, MD

## 2020-06-13 ENCOUNTER — Other Ambulatory Visit (INDEPENDENT_AMBULATORY_CARE_PROVIDER_SITE_OTHER): Payer: Self-pay | Admitting: Family Medicine

## 2020-06-13 DIAGNOSIS — I10 Essential (primary) hypertension: Secondary | ICD-10-CM

## 2020-06-20 ENCOUNTER — Other Ambulatory Visit (INDEPENDENT_AMBULATORY_CARE_PROVIDER_SITE_OTHER): Payer: Self-pay

## 2020-06-20 DIAGNOSIS — I10 Essential (primary) hypertension: Secondary | ICD-10-CM

## 2020-06-20 MED ORDER — LISINOPRIL 10 MG PO TABS: 5.0000 mg | ORAL_TABLET | Freq: Every day | ORAL | 0 refills | Status: DC

## 2020-06-26 ENCOUNTER — Other Ambulatory Visit (INDEPENDENT_AMBULATORY_CARE_PROVIDER_SITE_OTHER): Payer: Self-pay | Admitting: Family Medicine

## 2020-06-26 DIAGNOSIS — F3289 Other specified depressive episodes: Secondary | ICD-10-CM

## 2020-07-04 ENCOUNTER — Encounter (INDEPENDENT_AMBULATORY_CARE_PROVIDER_SITE_OTHER): Payer: Self-pay | Admitting: Family Medicine

## 2020-07-04 ENCOUNTER — Other Ambulatory Visit: Payer: Self-pay

## 2020-07-04 ENCOUNTER — Ambulatory Visit (INDEPENDENT_AMBULATORY_CARE_PROVIDER_SITE_OTHER): Payer: Medicare Other | Admitting: Family Medicine

## 2020-07-04 VITALS — BP 157/81 | HR 58 | Temp 97.5°F | Ht 63.0 in | Wt 172.0 lb

## 2020-07-04 DIAGNOSIS — I10 Essential (primary) hypertension: Secondary | ICD-10-CM | POA: Diagnosis not present

## 2020-07-04 DIAGNOSIS — R7303 Prediabetes: Secondary | ICD-10-CM | POA: Diagnosis not present

## 2020-07-04 DIAGNOSIS — Z6830 Body mass index (BMI) 30.0-30.9, adult: Secondary | ICD-10-CM

## 2020-07-04 DIAGNOSIS — F418 Other specified anxiety disorders: Secondary | ICD-10-CM

## 2020-07-04 DIAGNOSIS — E669 Obesity, unspecified: Secondary | ICD-10-CM | POA: Diagnosis not present

## 2020-07-04 MED ORDER — HYDROCHLOROTHIAZIDE 12.5 MG PO TABS: 12.5000 mg | ORAL_TABLET | Freq: Every day | ORAL | 0 refills | Status: DC

## 2020-07-04 MED ORDER — SERTRALINE HCL 25 MG PO TABS: 25.0000 mg | ORAL_TABLET | Freq: Every day | ORAL | 0 refills | Status: DC

## 2020-07-04 MED ORDER — METFORMIN HCL 500 MG PO TABS: 500.0000 mg | ORAL_TABLET | Freq: Every day | ORAL | 0 refills | Status: DC

## 2020-07-04 MED ORDER — LISINOPRIL 10 MG PO TABS: 5.0000 mg | ORAL_TABLET | Freq: Every day | ORAL | 0 refills | Status: DC

## 2020-07-04 MED ORDER — ATORVASTATIN CALCIUM 20 MG PO TABS: 20.0000 mg | ORAL_TABLET | Freq: Every day | ORAL | 0 refills | Status: DC

## 2020-07-04 NOTE — Progress Notes (Signed)
Chief Complaint:   OBESITY Adan is here to discuss her progress with her obesity treatment plan along with follow-up of her obesity related diagnoses. Merri is on keeping a food journal and adhering to recommended goals of 1000-1100 calories and 70+ grams of protein daily and states she is following her eating plan approximately 75% of the time. Anea states she is walking the dog 1 1/4 mile 7 times per week.   Today's visit was #: 72 Starting weight: 189 lbs Starting date: 10/12/2018 Today's weight: 172 lbs Today's date: 07/04/2020 Total lbs lost to date: 17 Total lbs lost since last in-office visit: 0  Interim History: Gisselle voices this week she really hasn't eaten much secondary to stress with her daughter. She reports that she could not eat all day and she realized that around 3 pm. She had GI side effects of metformin but is able to tolerate it now. She is able to his calories but she has been close to not achieving protein goals without protein powder.  Subjective:   1. Essential hypertension Malky's blood pressure was well controlled previously, but slightly elevated today. She denies chest pain, chest pressure, or headache.  2. Pre-diabetes Atavia's last A1c was 5.4 and insulin 10.3. She is on metformin but she has had GI side effects when starting, but this has subsided.  3. Depression with anxiety Takeisha denies suicidal ideas or homicidal ideas. She notes increase in stress recently with familial stress.  Assessment/Plan:   1. Essential hypertension Lennan is working on healthy weight loss and exercise to improve blood pressure control. We will watch for signs of hypotension as she continues her lifestyle modifications. We will refill atorvastatin for 90 days with no refill, we will refill hydrochlorothiazide for 1 month, and will refill lisinopril for 1 month.  - atorvastatin (LIPITOR) 20 MG tablet; Take 1 tablet (20 mg total) by mouth daily.  Dispense:  90 tablet; Refill: 0 - hydrochlorothiazide (HYDRODIURIL) 12.5 MG tablet; Take 1 tablet (12.5 mg total) by mouth daily.  Dispense: 30 tablet; Refill: 0 - lisinopril (ZESTRIL) 10 MG tablet; Take 0.5 tablets (5 mg total) by mouth daily.  Dispense: 30 tablet; Refill: 0  2. Pre-diabetes Vanilla will continue to work on weight loss, exercise, and decreasing simple carbohydrates to help decrease the risk of diabetes. We will refill metformin for 1 month.  - metFORMIN (GLUCOPHAGE) 500 MG tablet; Take 1 tablet (500 mg total) by mouth daily with breakfast.  Dispense: 30 tablet; Refill: 0  3. Depression with anxiety Behavior modification techniques were discussed today to help Armella deal with her emotional/non-hunger eating behaviors. We will refill sertraline for 1 month. Orders and follow up as documented in patient record.   - sertraline (ZOLOFT) 25 MG tablet; Take 1 tablet (25 mg total) by mouth daily.  Dispense: 30 tablet; Refill: 0  4. Class 1 obesity with serious comorbidity and body mass index (BMI) of 30.0 to 30.9 in adult, unspecified obesity type Shakora is currently in the action stage of change. As such, her goal is to continue with weight loss efforts. She has agreed to keeping a food journal and adhering to recommended goals of 1000-1100 calories and 70+ grams of protein daily.   Exercise goals: As is.  Behavioral modification strategies: increasing lean protein intake, meal planning and cooking strategies, keeping healthy foods in the home and planning for success.  Aylen has agreed to follow-up with our clinic in 4 weeks. She was informed of the importance  of frequent follow-up visits to maximize her success with intensive lifestyle modifications for her multiple health conditions.   Objective:   Blood pressure (!) 157/81, pulse (!) 58, temperature (!) 97.5 F (36.4 C), temperature source Oral, height 5\' 3"  (1.6 m), weight 172 lb (78 kg), last menstrual period 06/10/2011, SpO2 98  %. Body mass index is 30.47 kg/m.  General: Cooperative, alert, well developed, in no acute distress. HEENT: Conjunctivae and lids unremarkable. Cardiovascular: Regular rhythm.  Lungs: Normal work of breathing. Neurologic: No focal deficits.   Lab Results  Component Value Date   CREATININE 0.61 02/13/2020   BUN 20 02/13/2020   NA 141 02/13/2020   K 4.0 02/13/2020   CL 103 02/13/2020   CO2 33 (H) 02/13/2020   Lab Results  Component Value Date   ALT 38 (H) 02/13/2020   AST 22 02/13/2020   ALKPHOS 50 02/13/2020   BILITOT 0.5 02/13/2020   Lab Results  Component Value Date   HGBA1C 5.4 11/23/2019   HGBA1C 5.2 06/22/2019   HGBA1C 5.3 01/23/2019   HGBA1C 5.6 10/12/2018   HGBA1C 6.1 01/28/2017   Lab Results  Component Value Date   INSULIN 10.3 11/23/2019   INSULIN 6.7 06/22/2019   INSULIN 6.2 01/23/2019   INSULIN 5.9 10/12/2018   Lab Results  Component Value Date   TSH 2.340 12/01/2019   Lab Results  Component Value Date   CHOL 150 02/13/2020   HDL 40.70 02/13/2020   LDLCALC 88 02/13/2020   LDLDIRECT 83.0 01/28/2017   TRIG 108.0 02/13/2020   CHOLHDL 4 02/13/2020   Lab Results  Component Value Date   WBC 8.3 11/25/2019   HGB 13.4 11/25/2019   HCT 41.9 11/25/2019   MCV 79.8 (L) 11/25/2019   PLT 185 11/25/2019   Lab Results  Component Value Date   IRON 52 12/12/2007    Obesity Behavioral Intervention Documentation for Insurance:   Approximately 15 minutes were spent on the discussion below.  ASK: We discussed the diagnosis of obesity with [name] today and Rhyen agreed to give [name] permission to discuss obesity behavioral modification therapy today.  ASSESS: Avanell has the diagnosis of obesity and her BMI today is 30.48. Anijah is in the action stage of change.   ADVISE: Kimesha was educated on the multiple health risks of obesity as well as the benefit of weight loss to improve her health. She was advised of the need for long term treatment and  the importance of lifestyle modifications to improve her current health and to decrease her risk of future health problems.  AGREE: Multiple dietary modification options and treatment options were discussed and Babette agreed to follow the recommendations documented in the above note.  ARRANGE: Shela was educated on the importance of frequent visits to treat obesity as outlined per CMS and USPSTF guidelines and agreed to schedule her next follow up appointment today.  Attestation Statements:   Reviewed by clinician on day of visit: allergies, medications, problem list, medical history, surgical history, family history, social history, and previous encounter notes.   I, Trixie Dredge, am acting as transcriptionist for Coralie Common, MD.  I have reviewed the above documentation for accuracy and completeness, and I agree with the above. - Jinny Blossom, MD

## 2020-07-26 ENCOUNTER — Other Ambulatory Visit (INDEPENDENT_AMBULATORY_CARE_PROVIDER_SITE_OTHER): Payer: Self-pay | Admitting: Family Medicine

## 2020-07-26 DIAGNOSIS — R7303 Prediabetes: Secondary | ICD-10-CM

## 2020-07-26 DIAGNOSIS — F418 Other specified anxiety disorders: Secondary | ICD-10-CM

## 2020-07-31 ENCOUNTER — Ambulatory Visit (INDEPENDENT_AMBULATORY_CARE_PROVIDER_SITE_OTHER): Payer: Medicare Other | Admitting: Family Medicine

## 2020-07-31 ENCOUNTER — Other Ambulatory Visit: Payer: Self-pay

## 2020-07-31 ENCOUNTER — Encounter (INDEPENDENT_AMBULATORY_CARE_PROVIDER_SITE_OTHER): Payer: Self-pay | Admitting: Family Medicine

## 2020-07-31 VITALS — BP 158/87 | HR 59 | Temp 98.4°F | Ht 63.0 in | Wt 171.0 lb

## 2020-07-31 DIAGNOSIS — E669 Obesity, unspecified: Secondary | ICD-10-CM | POA: Diagnosis not present

## 2020-07-31 DIAGNOSIS — I10 Essential (primary) hypertension: Secondary | ICD-10-CM

## 2020-07-31 DIAGNOSIS — E8881 Metabolic syndrome: Secondary | ICD-10-CM | POA: Diagnosis not present

## 2020-07-31 DIAGNOSIS — F418 Other specified anxiety disorders: Secondary | ICD-10-CM | POA: Diagnosis not present

## 2020-07-31 DIAGNOSIS — Z6830 Body mass index (BMI) 30.0-30.9, adult: Secondary | ICD-10-CM | POA: Diagnosis not present

## 2020-07-31 MED ORDER — SERTRALINE HCL 25 MG PO TABS: 25.0000 mg | ORAL_TABLET | Freq: Every day | ORAL | 0 refills | Status: DC

## 2020-07-31 MED ORDER — LISINOPRIL 10 MG PO TABS: 10.0000 mg | ORAL_TABLET | Freq: Every day | ORAL | 0 refills | Status: DC

## 2020-07-31 MED ORDER — HYDROCHLOROTHIAZIDE 12.5 MG PO TABS: 12.5000 mg | ORAL_TABLET | Freq: Every day | ORAL | 0 refills | Status: DC

## 2020-07-31 MED ORDER — METFORMIN HCL 500 MG PO TABS: 500.0000 mg | ORAL_TABLET | Freq: Every day | ORAL | 0 refills | Status: DC

## 2020-08-01 NOTE — Progress Notes (Signed)
Chief Complaint:   OBESITY Lue is here to discuss her progress with her obesity treatment plan along with follow-up of her obesity related diagnoses. Orlean is on keeping a food journal and adhering to recommended goals of 1000-1100 calories and 70+ grams of protein daily and states she is following her eating plan approximately 80% of the time. Samaira states she is doing resistance with videos for 30-40 minutes 3 times per week, and walking the dog 1.5 miles 7 times per week.  Today's visit was #: 63 Starting weight: 189 lbs Starting date: 10/12/2018 Today's weight: 171 lbs Today's date: 07/31/2020 Total lbs lost to date: 18 Total lbs lost since last in-office visit: 1  Interim History: Ashari voices the last 4 weeks she has been doing most of the same activities. Calorically, she has been around 1050 most days, but made a concerted effort to get over 1100 calories this past week. She is getting in between 70-75 grams of protein. She is adding in a few exercise videos of resistance training.  Subjective:   1. Insulin resistance Aslin is on metformin with no GI side effects. She notes occasional hunger. She is taking metformin at bedtime to not feel effects.  2. Essential hypertension Jonessa's blood pressure is elevated today. She denies chest pain, chest pressure, or headache. She is on lisinopril 5 mg daily and hydrochlorothiazide 12.5 mg   3. Depression with anxiety Mossie denies suicidal or homicidal ideas. She still notes occasional emotional episodes but has significantly improved from previous.  Assessment/Plan:   1. Insulin resistance Ryenne will continue to work on weight loss, exercise, and decreasing simple carbohydrates to help decrease the risk of diabetes. we will refill metformin for 1 month. Arbadella agreed to follow-up with [name] as directed to closely monitor her progress.  - metFORMIN (GLUCOPHAGE) 500 MG tablet; Take 1 tablet (500 mg total) by mouth  daily with breakfast.  Dispense: 30 tablet; Refill: 0  2. Essential hypertension Jaimie is working on healthy weight loss and exercise to improve blood pressure control. We will watch for signs of hypotension as she continues her lifestyle modifications. We will refill both lisinopril and hydrochlorothiazide for 1 month.  - lisinopril (ZESTRIL) 10 MG tablet; Take 1 tablet (10 mg total) by mouth daily.  Dispense: 30 tablet; Refill: 0 - hydrochlorothiazide (HYDRODIURIL) 12.5 MG tablet; Take 1 tablet (12.5 mg total) by mouth daily.  Dispense: 30 tablet; Refill: 0  3. Depression with anxiety Behavior modification techniques were discussed today to help Esta deal with her depression and anxiety. We will refill Zoloft for 1 month. Orders and follow up as documented in patient record.   - sertraline (ZOLOFT) 25 MG tablet; Take 1 tablet (25 mg total) by mouth daily.  Dispense: 30 tablet; Refill: 0  4. Class 1 obesity with serious comorbidity and body mass index (BMI) of 30.0 to 30.9 in adult, unspecified obesity type Roise is currently in the action stage of change. As such, her goal is to continue with weight loss efforts. She has agreed to keeping a food journal and adhering to recommended goals of 1000-1100 calories and 70+ grams of protein daily.   Exercise goals: As is.  Behavioral modification strategies: increasing lean protein intake, increasing vegetables, meal planning and cooking strategies, keeping healthy foods in the home and planning for success.  Jakaylee has agreed to follow-up with our clinic in 4 to 5 weeks. She was informed of the importance of frequent follow-up visits to maximize her  success with intensive lifestyle modifications for her multiple health conditions.   Objective:   Blood pressure (!) 158/87, pulse (!) 59, temperature 98.4 F (36.9 C), temperature source Oral, height 5\' 3"  (1.6 m), weight 171 lb (77.6 kg), last menstrual period 06/10/2011, SpO2 96 %. Body  mass index is 30.29 kg/m.  General: Cooperative, alert, well developed, in no acute distress. HEENT: Conjunctivae and lids unremarkable. Cardiovascular: Regular rhythm.  Lungs: Normal work of breathing. Neurologic: No focal deficits.   Lab Results  Component Value Date   CREATININE 0.61 02/13/2020   BUN 20 02/13/2020   NA 141 02/13/2020   K 4.0 02/13/2020   CL 103 02/13/2020   CO2 33 (H) 02/13/2020   Lab Results  Component Value Date   ALT 38 (H) 02/13/2020   AST 22 02/13/2020   ALKPHOS 50 02/13/2020   BILITOT 0.5 02/13/2020   Lab Results  Component Value Date   HGBA1C 5.4 11/23/2019   HGBA1C 5.2 06/22/2019   HGBA1C 5.3 01/23/2019   HGBA1C 5.6 10/12/2018   HGBA1C 6.1 01/28/2017   Lab Results  Component Value Date   INSULIN 10.3 11/23/2019   INSULIN 6.7 06/22/2019   INSULIN 6.2 01/23/2019   INSULIN 5.9 10/12/2018   Lab Results  Component Value Date   TSH 2.340 12/01/2019   Lab Results  Component Value Date   CHOL 150 02/13/2020   HDL 40.70 02/13/2020   LDLCALC 88 02/13/2020   LDLDIRECT 83.0 01/28/2017   TRIG 108.0 02/13/2020   CHOLHDL 4 02/13/2020   Lab Results  Component Value Date   WBC 8.3 11/25/2019   HGB 13.4 11/25/2019   HCT 41.9 11/25/2019   MCV 79.8 (L) 11/25/2019   PLT 185 11/25/2019   Lab Results  Component Value Date   IRON 52 12/12/2007    Obesity Behavioral Intervention:   Approximately 15 minutes were spent on the discussion below.  ASK: We discussed the diagnosis of obesity with [name] today and Eiley agreed to give [name] permission to discuss obesity behavioral modification therapy today.  ASSESS: Clessie has the diagnosis of obesity and her BMI today is 30.3. Yarithza is in the action stage of change.   ADVISE: Mossie was educated on the multiple health risks of obesity as well as the benefit of weight loss to improve her health. She was advised of the need for long term treatment and the importance of lifestyle  modifications to improve her current health and to decrease her risk of future health problems.  AGREE: Multiple dietary modification options and treatment options were discussed and Nashly agreed to follow the recommendations documented in the above note.  ARRANGE: Zariana was educated on the importance of frequent visits to treat obesity as outlined per CMS and USPSTF guidelines and agreed to schedule her next follow up appointment today.  Attestation Statements:   Reviewed by clinician on day of visit: allergies, medications, problem list, medical history, surgical history, family history, social history, and previous encounter notes.   I, Trixie Dredge, am acting as transcriptionist for Coralie Common, MD.  I have reviewed the above documentation for accuracy and completeness, and I agree with the above. - Jinny Blossom, MD

## 2020-08-08 ENCOUNTER — Ambulatory Visit: Payer: Self-pay | Admitting: *Deleted

## 2020-08-20 ENCOUNTER — Encounter: Payer: Self-pay | Admitting: Family Medicine

## 2020-08-20 ENCOUNTER — Ambulatory Visit (INDEPENDENT_AMBULATORY_CARE_PROVIDER_SITE_OTHER): Payer: Medicare Other | Admitting: Family Medicine

## 2020-08-20 ENCOUNTER — Other Ambulatory Visit: Payer: Self-pay

## 2020-08-20 VITALS — BP 124/80 | HR 56 | Temp 98.4°F | Resp 18 | Ht 63.0 in | Wt 174.8 lb

## 2020-08-20 DIAGNOSIS — D229 Melanocytic nevi, unspecified: Secondary | ICD-10-CM | POA: Diagnosis not present

## 2020-08-20 DIAGNOSIS — I10 Essential (primary) hypertension: Secondary | ICD-10-CM

## 2020-08-20 DIAGNOSIS — E2839 Other primary ovarian failure: Secondary | ICD-10-CM

## 2020-08-20 DIAGNOSIS — Z6830 Body mass index (BMI) 30.0-30.9, adult: Secondary | ICD-10-CM

## 2020-08-20 DIAGNOSIS — F418 Other specified anxiety disorders: Secondary | ICD-10-CM | POA: Diagnosis not present

## 2020-08-20 DIAGNOSIS — D352 Benign neoplasm of pituitary gland: Secondary | ICD-10-CM

## 2020-08-20 DIAGNOSIS — E669 Obesity, unspecified: Secondary | ICD-10-CM

## 2020-08-20 DIAGNOSIS — E785 Hyperlipidemia, unspecified: Secondary | ICD-10-CM | POA: Diagnosis not present

## 2020-08-20 DIAGNOSIS — E8881 Metabolic syndrome: Secondary | ICD-10-CM | POA: Diagnosis not present

## 2020-08-20 MED ORDER — ATORVASTATIN CALCIUM 20 MG PO TABS: 20.0000 mg | ORAL_TABLET | Freq: Every day | ORAL | 1 refills | Status: DC

## 2020-08-20 NOTE — Progress Notes (Signed)
Patient ID: Kristin Delgado, female    DOB: Jul 28, 1956  Age: 64 y.o. MRN: [id number]    Subjective:  Subjective  HPI Kristin Delgado presents for f/u chol and bp.  Pt is going to healthy weight and wellness -- has been struggling recently but is sticking with it  She also c/o spot on her nose that flakes off and comes back ---- is red and irritated and never completely goes away Review of Systems  Constitutional: Negative for appetite change, diaphoresis, fatigue and unexpected weight change.  Eyes: Negative for pain, redness and visual disturbance.  Respiratory: Negative for cough, chest tightness, shortness of breath and wheezing.   Cardiovascular: Negative for chest pain, palpitations and leg swelling.  Endocrine: Negative for cold intolerance, heat intolerance, polydipsia, polyphagia and polyuria.  Genitourinary: Negative for difficulty urinating, dysuria and frequency.  Neurological: Negative for dizziness, light-headedness, numbness and headaches.    History Past Medical History:  Diagnosis Date  . Allergy    seasonal  . Anxiety    closed space like MRI  . Back pain \  . Chest pain 2006   admitted, essentially negative  . Constipation   . Depression   . GERD (gastroesophageal reflux disease)   . Hyperlipidemia   . Hypertension   . Infertility, female   . Joint pain   . Pituitary microadenoma ([place])   . Swallowing difficulty   . 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   Her family history includes Alzheimer's disease in her father; Cancer (age of onset: 71) in her mother; Dementia in her father; Lung cancer in her mother.She reports that she has never smoked. She has never used smokeless tobacco. She reports that she does not drink alcohol and does not use drugs.  Current Outpatient Medications on File Prior to Visit    Medication Sig Dispense Refill  . acetaminophen (TYLENOL ARTHRITIS PAIN) 650 MG CR tablet Take 650 mg by mouth every 8 (eight) hours as needed. pain     . Ascorbic Acid (VITAMIN C) 1000 MG tablet Take 1,000 mg by mouth 2 (two) times daily.      [name] b complex vitamins tablet Take 1 tablet by mouth daily.    . bromocriptine (PARLODEL) 2.5 MG tablet Take 1 tablet (2.5 mg total) by mouth daily. 90 tablet 3  . Calcium Acetate, Phos Binder, (CALCIUM ACETATE PO) Take by mouth.    . Calcium Polycarbophil (FIBER-CAPS PO) Take by mouth.    . cholecalciferol (VITAMIN D3) 25 MCG (1000 UT) tablet Take 5,000 Units by mouth daily.    . ferrous sulfate 325 (65 FE) MG EC tablet Take 325 mg by mouth 3 (three) times daily with meals.    . hydrochlorothiazide (HYDRODIURIL) 12.5 MG tablet Take 1 tablet (12.5 mg total) by mouth daily. 30 tablet 0  . lisinopril (ZESTRIL) 10 MG tablet Take 1 tablet (10 mg total) by mouth daily. 30 tablet 0  . metFORMIN (GLUCOPHAGE) 500 MG tablet Take 1 tablet (500 mg total) by mouth daily with breakfast. 30 tablet 0  . Multiple Vitamin (MULTIVITAMIN) tablet Take 1 tablet by mouth daily.      . Omega 3-6-9 Fatty Acids (OMEGA 3-6-9 COMPLEX PO) Take by mouth.    . Omega-3 Fatty Acids (FISH OIL) 1200 MG CAPS Take 1 capsule by mouth 2 (two) times daily.    [name]  Probiotic Product (PROBIOTIC ADVANCED PO) Take by mouth.    . sertraline (ZOLOFT) 25 MG tablet Take 1 tablet (25 mg total) by mouth daily. 30 tablet 0   No current facility-administered medications on file prior to visit.     Objective:  Objective  Physical Exam Vitals and nursing note reviewed.  Constitutional:      Appearance: She is well-developed.  HENT:     Head: Normocephalic and atraumatic.  Eyes:     Conjunctiva/sclera: Conjunctivae normal.  Neck:     Thyroid: No thyromegaly.     Vascular: No carotid bruit or JVD.  Cardiovascular:     Rate and Rhythm: Normal rate and regular rhythm.     Heart sounds: Normal heart  sounds. No murmur heard.   Pulmonary:     Effort: Pulmonary effort is normal. No respiratory distress.     Breath sounds: Normal breath sounds. No wheezing or rales.  Chest:     Chest wall: No tenderness.  Musculoskeletal:     Cervical back: Normal range of motion and neck supple.  Skin:    Findings: Erythema and lesion present.       Neurological:     Mental Status: She is alert and oriented to person, place, and time.    BP 124/80 (BP Location: Left Arm, Patient Position: Sitting, Cuff Size: Normal)   Pulse (!) 56   Temp 98.4 F (36.9 C) (Oral)   Resp 18   Ht 5\' 3"  (1.6 m)   Wt 174 lb 12.8 oz (79.3 kg)   LMP 06/10/2011   SpO2 99%   BMI 30.96 kg/m  Wt Readings from Last 3 Encounters:  08/20/20 174 lb 12.8 oz (79.3 kg)  07/31/20 171 lb (77.6 kg)  07/04/20 172 lb (78 kg)     Lab Results  Component Value Date   WBC 8.3 11/25/2019   HGB 13.4 11/25/2019   HCT 41.9 11/25/2019   PLT 185 11/25/2019   GLUCOSE 91 02/13/2020   CHOL 150 02/13/2020   TRIG 108.0 02/13/2020   HDL 40.70 02/13/2020   LDLDIRECT 83.0 01/28/2017   LDLCALC 88 02/13/2020   ALT 38 (H) 02/13/2020   AST 22 02/13/2020   NA 141 02/13/2020   K 4.0 02/13/2020   CL 103 02/13/2020   CREATININE 0.61 02/13/2020   BUN 20 02/13/2020   CO2 33 (H) 02/13/2020   TSH 2.340 12/01/2019   HGBA1C 5.4 11/23/2019    CT CORONARY MORPH W/CTA COR W/SCORE W/CA W/CM &/OR WO/CM  Addendum Date: 12/28/2019   ADDENDUM REPORT: 12/28/2019 16:29 EXAM: OVER-READ INTERPRETATION  CT CHEST The following report is an over-read performed by radiologist Dr. Rosine Beat [facility] on 12/28/2019. This over-read does not include interpretation of cardiac or coronary anatomy or pathology. The coronary CTA interpretation by the cardiologist is attached. COMPARISON:  None. FINDINGS: The visualized portions of the lower lung fields show no suspicious nodules, masses, or infiltrates. No pleural fluid seen. The visualized  portions of the mediastinum show no lymphadenopathy. Tiny hiatal hernia noted. Imaged portion of the upper abdomen is unremarkable. IMPRESSION: No significant non-cardiovascular abnormality seen in visualized portion of the thorax. Electronically Signed   By: Misty Stanley M.D.   On: 12/28/2019 16:29   Result Date: 12/28/2019 CLINICAL DATA:  Hx of htn, hld, atypical chestpain EXAM: Cardiac/Coronary  CTA TECHNIQUE: The patient was scanned on a Siemens Somatoform go.Top scanner. FINDINGS: A retrospective scan was triggered in the descending thoracic aorta. Axial non-contrast 3 mm  slices were carried out through the heart. The data set was analyzed on a dedicated work station and scored using the [place]. Gantry rotation speed was 330 msecs and collimation was .6 mm. 50mg  of metoprolol and 0.8 mg of sl NTG was given. The 3D data set was reconstructed in 5% intervals of the 35-95 % of the R-R cycle. Diastolic phases were analyzed on a dedicated work station using MPR, MIP and VRT modes. The patient received 125 cc of contrast. Aorta: Mild ascending aorta dilation, measuring 28mm in largest diameter. Mild ascending aortic wall calcifications. No dissection. Aortic Valve:  Trileaflet.  No calcifications. Coronary Arteries:  Normal coronary origin.  Right dominance. RCA is a large dominant artery that gives rise to PDA and PLA. There is no plaque. Left main is a large artery that gives rise to LAD and LCX arteries. LAD is a large vessel that has no plaque. LCX is a non-dominant artery that gives rise to one large first obtuse marginal branch. There is no plaque. Other findings: Normal pulmonary vein drainage into the left atrium. Normal left atrial appendage without a thrombus. Normal size of the pulmonary artery. IMPRESSION: 1. Coronary calcium score of 0. 2. Mild ascending aorta dilation, measuring 26mm in largest diameter 3. Normal coronary origin with right dominance. 4. No evidence of CAD. 5. CAD-RADS 0. No  evidence of CAD (0%). Consider non-atherosclerotic causes of chest pain. Electronically Signed: By: Kate Sable M.D. On: 12/28/2019 13:03   CT ANGIO CHEST AORTA W/CM & OR WO/CM  Result Date: 12/28/2019 CLINICAL DATA:  Ascending thoracic aortic aneurysm on a cardiac/coronary CTA today. EXAM: CT ANGIOGRAPHY CHEST WITH CONTRAST TECHNIQUE: Multidetector CT imaging of the chest was performed using the standard protocol during bolus administration of intravenous contrast. Multiplanar CT image reconstructions and MIPs were obtained to evaluate the vascular anatomy. CONTRAST:  142mL OMNIPAQUE IOHEXOL 350 MG/ML SOLN COMPARISON:  Cardiac/coronary CTA obtained today. FINDINGS: Cardiovascular: Borderline aneurysmal dilatation of the ascending thoracic aorta with a maximum diameter of 4.0 cm. The remainder of the aorta is normal in caliber. Mild thoracic aortic atheromatous calcifications. Mediastinum/Nodes: Small hiatal hernia. No enlarged mediastinal, hilar, or axillary lymph nodes. Thyroid gland, trachea, and esophagus demonstrate no significant findings. Lungs/Pleura: Very minimal bilateral dependent atelectasis. Otherwise, clear lungs. No pleural fluid. Upper Abdomen: Diffuse low density of the liver relative to the spleen. Musculoskeletal: Mild thoracic spine degenerative changes. Mild-to-moderate lower cervical spine degenerative changes. Moderate thoracolumbar spine degenerative changes. Review of the MIP images confirms the above findings. IMPRESSION: 1. Borderline aneurysmal dilatation of the ascending thoracic aorta with a maximum diameter of 4.0 cm. Recommend annual imaging followup by CTA or MRA. This recommendation follows 2010 ACCF/AHA/AATS/ACR/ASA/SCA/SCAI/SIR/STS/SVM Guidelines for the Diagnosis and Management of Patients with Thoracic Aortic Disease. Circulation. 2010; 121: I503-U882. Aortic aneurysm NOS (ICD10-I71.9) 2. Small hiatal hernia. 3. Diffuse hepatic steatosis. Aortic Atherosclerosis  (ICD10-I70.0). Electronically Signed   By: Claudie Revering M.D.   On: 12/28/2019 20:05     Assessment & Plan:  Plan  I have discontinued [place] PO. I am also having her maintain her multivitamin, vitamin C, acetaminophen, Fish Oil, Omega 3-6-9 Fatty Acids (OMEGA 3-6-9 COMPLEX PO), Probiotic Product (PROBIOTIC ADVANCED PO), Calcium Polycarbophil (FIBER-CAPS PO), ferrous sulfate, b complex vitamins, cholecalciferol, (Calcium Acetate, Phos Binder, (CALCIUM ACETATE PO)), bromocriptine, lisinopril, hydrochlorothiazide, metFORMIN, sertraline, and atorvastatin.  Meds ordered this encounter  Medications  . atorvastatin (LIPITOR) 20 MG tablet    Sig: Take 1 tablet (20  mg total) by mouth daily.    Dispense:  90 tablet    Refill:  1    Problem List Items Addressed This Visit      Unprioritized   Class 1 obesity with serious comorbidity and body mass index (BMI) of 30.0 to 30.9 in adult    Per healthy weight and wellness      Depression with anxiety    Stable       Essential hypertension    Well controlled, no changes to meds. Encouraged heart healthy diet such as the DASH diet and exercise as tolerated.       Relevant Medications   atorvastatin (LIPITOR) 20 MG tablet   Other Relevant Orders   Comprehensive metabolic panel   Lipid panel   Insulin, random   Hyperlipidemia - Primary    Encouraged heart healthy diet, increase exercise, avoid trans fats, consider a krill oil cap daily      Relevant Medications   atorvastatin (LIPITOR) 20 MG tablet   Other Relevant Orders   Comprehensive metabolic panel   Lipid panel   CBC with Differential/Platelet   Insulin, random   Insulin resistance    Check labs       Pituitary adenoma ([facility])    Check prolactin      Relevant Orders   Prolactin   Suspicious nevus    ? [place] Refer to derm       Relevant Orders   Ambulatory referral to Dermatology    Other Visit Diagnoses    Morbid obesity ([place])       Relevant  Orders   CBC with Differential/Platelet   Insulin, random   Estrogen deficiency       Relevant Orders   DG Bone Density      Follow-up: Return in about 6 months (around 02/18/2021), or if symptoms worsen or fail to improve, for hypertension, hyperlipidemia.  [name], DO

## 2020-08-20 NOTE — Assessment & Plan Note (Signed)
Encouraged heart healthy diet, increase exercise, avoid trans fats, consider a krill oil cap daily 

## 2020-08-20 NOTE — Patient Instructions (Addendum)
DASH Eating Plan DASH stands for "Dietary Approaches to Stop Hypertension." The DASH eating plan is a healthy eating plan that has been shown to reduce high blood pressure (hypertension). It may also reduce your risk for type 2 diabetes, heart disease, and stroke. The DASH eating plan may also help with weight loss. What are tips for following this plan?  General guidelines  Avoid eating more than 2,300 mg (milligrams) of salt (sodium) a day. If you have hypertension, you may need to reduce your sodium intake to 1,500 mg a day.  Limit alcohol intake to no more than 1 drink a day for nonpregnant women and 2 drinks a day for men. One drink equals 12 oz of beer, 5 oz of wine, or 1 oz of hard liquor.  Work with your health care provider to maintain a healthy body weight or to lose weight. Ask what an ideal weight is for you.  Get at least 30 minutes of exercise that causes your heart to beat faster (aerobic exercise) most days of the week. Activities may include walking, swimming, or biking.  Work with your health care provider or diet and nutrition specialist (dietitian) to adjust your eating plan to your individual calorie needs. Reading food labels   Check food labels for the amount of sodium per serving. Choose foods with less than 5 percent of the Daily Value of sodium. Generally, foods with less than 300 mg of sodium per serving fit into this eating plan.  To find whole grains, look for the word "whole" as the first word in the ingredient list. Shopping  Buy products labeled as "low-sodium" or "no salt added."  Buy fresh foods. Avoid canned foods and premade or frozen meals. Cooking  Avoid adding salt when cooking. Use salt-free seasonings or herbs instead of table salt or sea salt. Check with your health care provider or pharmacist before using salt substitutes.  Do not fry foods. Cook foods using healthy methods such as baking, boiling, grilling, and broiling instead.  Cook with  heart-healthy oils, such as olive, canola, soybean, or sunflower oil. Meal planning  Eat a balanced diet that includes: ? 5 or more servings of fruits and vegetables each day. At each meal, try to fill half of your plate with fruits and vegetables. ? Up to 6-8 servings of whole grains each day. ? Less than 6 oz of lean meat, poultry, or fish each day. A 3-oz serving of meat is about the same size as a deck of cards. One egg equals 1 oz. ? 2 servings of low-fat dairy each day. ? A serving of nuts, seeds, or beans 5 times each week. ? Heart-healthy fats. Healthy fats called Omega-3 fatty acids are found in foods such as flaxseeds and coldwater fish, like sardines, salmon, and mackerel.  Limit how much you eat of the following: ? Canned or prepackaged foods. ? Food that is high in trans fat, such as fried foods. ? Food that is high in saturated fat, such as fatty meat. ? Sweets, desserts, sugary drinks, and other foods with added sugar. ? Full-fat dairy products.  Do not salt foods before eating.  Try to eat at least 2 vegetarian meals each week.  Eat more home-cooked food and less restaurant, buffet, and fast food.  When eating at a restaurant, ask that your food be prepared with less salt or no salt, if possible. What foods are recommended? The items listed may not be a complete list. Talk with your dietitian about   what dietary choices are best for you. Grains Whole-grain or whole-wheat bread. Whole-grain or whole-wheat pasta. Brown rice. [place]. Bulgur. Whole-grain and low-sodium cereals. Pita bread. Low-fat, low-sodium crackers. Whole-wheat flour tortillas. Vegetables Fresh or frozen vegetables (raw, steamed, roasted, or grilled). Low-sodium or reduced-sodium tomato and vegetable juice. Low-sodium or reduced-sodium tomato sauce and tomato paste. Low-sodium or reduced-sodium canned vegetables. Fruits All fresh, dried, or frozen fruit. Canned fruit in natural juice (without  added sugar). Meat and other protein foods Skinless chicken or [place]. Ground chicken or [place]. Pork with fat trimmed off. Fish and seafood. Egg whites. Dried beans, peas, or lentils. Unsalted nuts, nut butters, and seeds. Unsalted canned beans. Lean cuts of beef with fat trimmed off. Low-sodium, lean deli meat. Dairy Low-fat (1%) or fat-free (skim) milk. Fat-free, low-fat, or reduced-fat cheeses. Nonfat, low-sodium ricotta or cottage cheese. Low-fat or nonfat yogurt. Low-fat, low-sodium cheese. Fats and oils Soft margarine without trans fats. Vegetable oil. Low-fat, reduced-fat, or light mayonnaise and salad dressings (reduced-sodium). Canola, safflower, olive, soybean, and sunflower oils. Avocado. Seasoning and other foods Herbs. Spices. Seasoning mixes without salt. Unsalted popcorn and pretzels. Fat-free sweets. What foods are not recommended? The items listed may not be a complete list. Talk with your dietitian about what dietary choices are best for you. Grains Baked goods made with fat, such as croissants, muffins, or some breads. Dry pasta or rice meal packs. Vegetables Creamed or fried vegetables. Vegetables in a cheese sauce. Regular canned vegetables (not low-sodium or reduced-sodium). Regular canned tomato sauce and paste (not low-sodium or reduced-sodium). Regular tomato and vegetable juice (not low-sodium or reduced-sodium). Angie Fava. Olives. Fruits Canned fruit in a light or heavy syrup. Fried fruit. Fruit in cream or butter sauce. Meat and other protein foods Fatty cuts of meat. Ribs. Fried meat. Berniece Salines. Sausage. Bologna and other processed lunch meats. Salami. Fatback. Hotdogs. Bratwurst. Salted nuts and seeds. Canned beans with added salt. Canned or smoked fish. Whole eggs or egg yolks. Chicken or [place] with skin. Dairy Whole or 2% milk, cream, and half-and-half. Whole or full-fat cream cheese. Whole-fat or sweetened yogurt. Full-fat cheese. Nondairy creamers. Whipped toppings.  Processed cheese and cheese spreads. Fats and oils Butter. Stick margarine. Lard. Shortening. Ghee. Bacon fat. Tropical oils, such as coconut, palm kernel, or palm oil. Seasoning and other foods Salted popcorn and pretzels. Onion salt, garlic salt, seasoned salt, table salt, and sea salt. Worcestershire sauce. Tartar sauce. Barbecue sauce. Teriyaki sauce. Soy sauce, including reduced-sodium. Steak sauce. Canned and packaged gravies. Fish sauce. Oyster sauce. Cocktail sauce. Horseradish that you find on the shelf. Ketchup. Mustard. Meat flavorings and tenderizers. Bouillon cubes. Hot sauce and Tabasco sauce. Premade or packaged marinades. Premade or packaged taco seasonings. Relishes. Regular salad dressings. Where to find more information:  National Heart, Lung, and [place]: https://[place]-e[place].com/  American Heart Association: www.heart.[place].

## 2020-08-20 NOTE — Assessment & Plan Note (Signed)
Per healthy weight and wellness 

## 2020-08-20 NOTE — Assessment & Plan Note (Signed)
Check prolactin  

## 2020-08-20 NOTE — Assessment & Plan Note (Signed)
?   [facility] Refer to derm

## 2020-08-20 NOTE — Assessment & Plan Note (Signed)
Check labs 

## 2020-08-20 NOTE — Assessment & Plan Note (Signed)
Well controlled, no changes to meds. Encouraged heart healthy diet such as the DASH diet and exercise as tolerated.  °

## 2020-08-20 NOTE — Assessment & Plan Note (Signed)
Stable

## 2020-08-21 LAB — LIPID PANEL
Cholesterol: 164 mg/dL (ref <200)
HDL: 39 mg/dL — ABNORMAL LOW (ref >=50)
LDL Cholesterol (Calc): 101 mg/dL (calc) — ABNORMAL HIGH
Non-HDL Cholesterol (Calc): 125 mg/dL (calc) (ref <130)
Total CHOL/HDL Ratio: 4.2 (calc) (ref <5.0)
Triglycerides: 141 mg/dL (ref <150)

## 2020-08-21 LAB — COMPREHENSIVE METABOLIC PANEL
AG Ratio: 1.7 (calc) (ref 1.0–2.5)
ALT: 40 U/L — ABNORMAL HIGH (ref 6–29)
AST: 23 U/L (ref 10–35)
Albumin: 4.3 g/dL (ref 3.6–5.1)
Alkaline phosphatase (APISO): 57 U/L (ref 37–153)
BUN: 21 mg/dL (ref 7–25)
CO2: 30 mmol/L (ref 20–32)
Calcium: 9.9 mg/dL (ref 8.6–10.4)
Chloride: 103 mmol/L (ref 98–110)
Creat: 0.67 mg/dL (ref 0.50–0.99)
Globulin: 2.6 g/dL (calc) (ref 1.9–3.7)
Glucose, Bld: 111 mg/dL — ABNORMAL HIGH (ref 65–99)
Potassium: 4.3 mmol/L (ref 3.5–5.3)
Sodium: 140 mmol/L (ref 135–146)
Total Bilirubin: 0.4 mg/dL (ref 0.2–1.2)
Total Protein: 6.9 g/dL (ref 6.1–8.1)

## 2020-08-21 LAB — CBC WITH DIFFERENTIAL/PLATELET
Absolute Monocytes: 331 cells/uL (ref 200–950)
Basophils Absolute: 17 cells/uL (ref 0–200)
Basophils Relative: 0.3 %
Eosinophils Absolute: 52 cells/uL (ref 15–500)
Eosinophils Relative: 0.9 %
HCT: 40 % (ref 35.0–45.0)
Hemoglobin: 12.8 g/dL (ref 11.7–15.5)
Lymphs Abs: 2013 cells/uL (ref 850–3900)
MCH: 26.3 pg — ABNORMAL LOW (ref 27.0–33.0)
MCHC: 32 g/dL (ref 32.0–36.0)
MCV: 82.3 fL (ref 80.0–100.0)
MPV: 11.9 fL (ref 7.5–12.5)
Monocytes Relative: 5.7 %
Neutro Abs: 3387 cells/uL (ref 1500–7800)
Neutrophils Relative %: 58.4 %
Platelets: 184 10*3/uL (ref 140–400)
RBC: 4.86 10*6/uL (ref 3.80–5.10)
RDW: 13.7 % (ref 11.0–15.0)
Total Lymphocyte: 34.7 %
WBC: 5.8 10*3/uL (ref 3.8–10.8)

## 2020-08-21 LAB — PROLACTIN: Prolactin: 10.1 ng/mL

## 2020-08-21 LAB — INSULIN, RANDOM: Insulin: 8.8 u[IU]/mL

## 2020-08-21 LAB — EXTRA SPECIMEN

## 2020-08-27 ENCOUNTER — Other Ambulatory Visit (INDEPENDENT_AMBULATORY_CARE_PROVIDER_SITE_OTHER): Payer: Self-pay | Admitting: Family Medicine

## 2020-08-27 DIAGNOSIS — I10 Essential (primary) hypertension: Secondary | ICD-10-CM

## 2020-08-27 DIAGNOSIS — F418 Other specified anxiety disorders: Secondary | ICD-10-CM

## 2020-08-27 DIAGNOSIS — E8881 Metabolic syndrome: Secondary | ICD-10-CM

## 2020-08-28 ENCOUNTER — Ambulatory Visit (INDEPENDENT_AMBULATORY_CARE_PROVIDER_SITE_OTHER): Payer: Medicare Other | Admitting: Family Medicine

## 2020-08-28 ENCOUNTER — Other Ambulatory Visit: Payer: Self-pay

## 2020-08-28 ENCOUNTER — Encounter (INDEPENDENT_AMBULATORY_CARE_PROVIDER_SITE_OTHER): Payer: Self-pay | Admitting: Family Medicine

## 2020-08-28 VITALS — BP 151/81 | HR 72 | Temp 98.0°F | Ht 63.0 in | Wt 172.0 lb

## 2020-08-28 DIAGNOSIS — I10 Essential (primary) hypertension: Secondary | ICD-10-CM | POA: Diagnosis not present

## 2020-08-28 DIAGNOSIS — E8881 Metabolic syndrome: Secondary | ICD-10-CM | POA: Diagnosis not present

## 2020-08-28 DIAGNOSIS — E88819 Insulin resistance, unspecified: Secondary | ICD-10-CM

## 2020-08-28 DIAGNOSIS — E669 Obesity, unspecified: Secondary | ICD-10-CM

## 2020-08-28 DIAGNOSIS — Z6830 Body mass index (BMI) 30.0-30.9, adult: Secondary | ICD-10-CM

## 2020-08-28 DIAGNOSIS — F418 Other specified anxiety disorders: Secondary | ICD-10-CM | POA: Diagnosis not present

## 2020-08-28 MED ORDER — SERTRALINE HCL 25 MG PO TABS: 25.0000 mg | ORAL_TABLET | Freq: Every day | ORAL | 0 refills | Status: DC

## 2020-08-28 MED ORDER — LISINOPRIL 10 MG PO TABS: 10.0000 mg | ORAL_TABLET | Freq: Every day | ORAL | 0 refills | Status: DC

## 2020-08-28 MED ORDER — METFORMIN HCL 500 MG PO TABS: 500.0000 mg | ORAL_TABLET | Freq: Every day | ORAL | 0 refills | Status: DC

## 2020-08-28 MED ORDER — HYDROCHLOROTHIAZIDE 12.5 MG PO TABS: 12.5000 mg | ORAL_TABLET | Freq: Every day | ORAL | 0 refills | Status: DC

## 2020-09-02 NOTE — Progress Notes (Signed)
Chief Complaint:   OBESITY Kristin Delgado is here to discuss her progress with her obesity treatment plan along with follow-up of her obesity related diagnoses. Kristin Delgado is on keeping a food journal and adhering to recommended goals of 1000-1100 calories and 70 grams of protein and states she is following her eating plan approximately 100% of the time. Kristin Delgado states she is walking the dog for 30 minutes 7 times per week and resistance training for 40 minutes 3 times per week.  Today's visit was #: 28 Starting weight: 189 lbs Starting date: 10/12/2018 Today's weight: 172 lbs Today's date: 08/28/2020 Total lbs lost to date: 17 lbs Total lbs lost since last in-office visit: 0  Interim History: Kristin Delgado says she has been following the plan 100%.  A few days a couple weeks ago, she actually felt hunger.  She has increased her physical activity recently as well.  She is weighing at home and has noticed weight loss at home, except for today.  She is dealing with significant familial stress with her daughter.  Subjective:   1. Essential hypertension Blood pressure is elevated today, but previously within normal limits at Dr. Nonda Lou office.  Denies chest pain, chest pressure, headache.  Blood pressure within normal limits on home readings as well.  BP Readings from Last 3 Encounters:  08/28/20 (!) 151/81  08/20/20 124/80  07/31/20 (!) 158/87   2. Insulin resistance Last insulin level 8.8.  She is on metformin.  Recent labs showing elevated fasting glucose.  Minimal carb cravings.  Lab Results  Component Value Date   INSULIN 10.3 11/23/2019   INSULIN 6.7 06/22/2019   INSULIN 6.2 01/23/2019   INSULIN 5.9 10/12/2018   Lab Results  Component Value Date   HGBA1C 5.4 11/23/2019   3. Depression with anxiety Denies SI/HI.  She is taking sertraline 25 mg daily.  Significant familial stressors with daughter.  Assessment/Plan:   1. Essential hypertension [name] is working on healthy weight  loss and exercise to improve blood pressure control. We will watch for signs of hypotension as she continues her lifestyle modifications.  Refill lisinopril and HCTZ today, as per below.  -Refill lisinopril (ZESTRIL) 10 MG tablet; Take 1 tablet (10 mg total) by mouth daily.  Dispense: 30 tablet; Refill: 0 -Refill hydrochlorothiazide (HYDRODIURIL) 12.5 MG tablet; Take 1 tablet (12.5 mg total) by mouth daily.  Dispense: 30 tablet; Refill: 0  2. Insulin resistance Kristin Delgado will continue to work on weight loss, exercise, and decreasing simple carbohydrates to help decrease the risk of diabetes. Kristin Delgado agreed to follow-up with Kristin Delgado as directed to closely monitor her progress.  Refill metformin today, as per below.  -Refill metFORMIN (GLUCOPHAGE) 500 MG tablet; Take 1 tablet (500 mg total) by mouth daily with breakfast.  Dispense: 30 tablet; Refill: 0  3. Depression with anxiety Refill sertraline 25 mg, as per below.  -Refill sertraline (ZOLOFT) 25 MG tablet; Take 1 tablet (25 mg total) by mouth daily.  Dispense: 30 tablet; Refill: 0  4. Class 1 obesity with serious comorbidity and body mass index (BMI) of 30.0 to 30.9 in adult, unspecified obesity type  [name] is currently in the action stage of change. As such, her goal is to continue with weight loss efforts. She has agreed to keeping a food journal and adhering to recommended goals of 1000-1200 calories and 75+ grams of protein.   Exercise goals: As is.  Behavioral modification strategies: increasing lean protein intake, meal planning and cooking strategies, keeping healthy foods  in the home and planning for success.  [name] has agreed to follow-up with our clinic in 4 weeks with Dr. Leafy Ro. She was informed of the importance of frequent follow-up visits to maximize her success with intensive lifestyle modifications for her multiple health conditions.   Objective:   Blood pressure (!) 151/81, pulse 72, temperature 98 F (36.7 C),  temperature source Oral, height 5\' 3"  (1.6 m), weight 172 lb (78 kg), last menstrual period 06/10/2011, SpO2 97 %. Body mass index is 30.47 kg/m.  General: Cooperative, alert, well developed, in no acute distress. HEENT: Conjunctivae and lids unremarkable. Cardiovascular: Regular rhythm.  Lungs: Normal work of breathing. Neurologic: No focal deficits.   Lab Results  Component Value Date   CREATININE 0.67 08/20/2020   BUN 21 08/20/2020   NA 140 08/20/2020   K 4.3 08/20/2020   CL 103 08/20/2020   CO2 30 08/20/2020   Lab Results  Component Value Date   ALT 40 (H) 08/20/2020   AST 23 08/20/2020   ALKPHOS 50 02/13/2020   BILITOT 0.4 08/20/2020   Lab Results  Component Value Date   HGBA1C 5.4 11/23/2019   HGBA1C 5.2 06/22/2019   HGBA1C 5.3 01/23/2019   HGBA1C 5.6 10/12/2018   HGBA1C 6.1 01/28/2017   Lab Results  Component Value Date   INSULIN 10.3 11/23/2019   INSULIN 6.7 06/22/2019   INSULIN 6.2 01/23/2019   INSULIN 5.9 10/12/2018   Lab Results  Component Value Date   TSH 2.340 12/01/2019   Lab Results  Component Value Date   CHOL 164 08/20/2020   HDL 39 (L) 08/20/2020   LDLCALC 101 (H) 08/20/2020   LDLDIRECT 83.0 01/28/2017   TRIG 141 08/20/2020   CHOLHDL 4.2 08/20/2020   Lab Results  Component Value Date   WBC 5.8 08/20/2020   HGB 12.8 08/20/2020   HCT 40.0 08/20/2020   MCV 82.3 08/20/2020   PLT 184 08/20/2020   Lab Results  Component Value Date   IRON 52 12/12/2007   Obesity Behavioral Intervention:   Approximately 15 minutes were spent on the discussion below.  ASK: We discussed the diagnosis of obesity with Kristin Delgado today and Kristin Delgado agreed to give Kristin Delgado permission to discuss obesity behavioral modification therapy today.  ASSESS: Kristin Delgado has the diagnosis of obesity and her BMI today is 30.5. Kristin Delgado is in the action stage of change.   ADVISE: Kristin Delgado was educated on the multiple health risks of obesity as well as the benefit of weight loss to  improve her health. She was advised of the need for long term treatment and the importance of lifestyle modifications to improve her current health and to decrease her risk of future health problems.  AGREE: Multiple dietary modification options and treatment options were discussed and Wanona agreed to follow the recommendations documented in the above note.  ARRANGE: Ilina was educated on the importance of frequent visits to treat obesity as outlined per CMS and USPSTF guidelines and agreed to schedule her next follow up appointment today.  Attestation Statements:   Reviewed by clinician on day of visit: allergies, medications, problem list, medical history, surgical history, family history, social history, and previous encounter notes.  I, Water quality scientist, CMA, am acting as transcriptionist for Coralie Common, MD.  I have reviewed the above documentation for accuracy and completeness, and I agree with the above. - Jinny Blossom, MD

## 2020-09-25 ENCOUNTER — Other Ambulatory Visit (INDEPENDENT_AMBULATORY_CARE_PROVIDER_SITE_OTHER): Payer: Self-pay | Admitting: Family Medicine

## 2020-09-25 DIAGNOSIS — F418 Other specified anxiety disorders: Secondary | ICD-10-CM

## 2020-09-25 DIAGNOSIS — E8881 Metabolic syndrome: Secondary | ICD-10-CM

## 2020-09-25 NOTE — Telephone Encounter (Signed)
Dr. U

## 2020-09-26 ENCOUNTER — Ambulatory Visit (INDEPENDENT_AMBULATORY_CARE_PROVIDER_SITE_OTHER): Payer: Medicare Other | Admitting: Family Medicine

## 2020-09-26 ENCOUNTER — Encounter (INDEPENDENT_AMBULATORY_CARE_PROVIDER_SITE_OTHER): Payer: Self-pay | Admitting: Family Medicine

## 2020-09-26 ENCOUNTER — Other Ambulatory Visit: Payer: Self-pay

## 2020-09-26 VITALS — BP 110/77 | HR 60 | Temp 98.0°F | Ht 63.0 in | Wt 173.0 lb

## 2020-09-26 DIAGNOSIS — Z6830 Body mass index (BMI) 30.0-30.9, adult: Secondary | ICD-10-CM

## 2020-09-26 DIAGNOSIS — I10 Essential (primary) hypertension: Secondary | ICD-10-CM | POA: Diagnosis not present

## 2020-09-26 DIAGNOSIS — E669 Obesity, unspecified: Secondary | ICD-10-CM

## 2020-09-26 DIAGNOSIS — R7303 Prediabetes: Secondary | ICD-10-CM

## 2020-09-26 DIAGNOSIS — F3289 Other specified depressive episodes: Secondary | ICD-10-CM | POA: Diagnosis not present

## 2020-09-26 MED ORDER — METFORMIN HCL 500 MG PO TABS: 500.0000 mg | ORAL_TABLET | Freq: Every day | ORAL | 0 refills | Status: DC

## 2020-09-26 MED ORDER — SERTRALINE HCL 25 MG PO TABS: 25.0000 mg | ORAL_TABLET | Freq: Every day | ORAL | 0 refills | Status: DC

## 2020-09-26 MED ORDER — HYDROCHLOROTHIAZIDE 12.5 MG PO TABS: 12.5000 mg | ORAL_TABLET | Freq: Every day | ORAL | 0 refills | Status: DC

## 2020-09-26 NOTE — Progress Notes (Signed)
Chief Complaint:   OBESITY Anora is here to discuss her progress with her obesity treatment plan along with follow-up of her obesity related diagnoses. Aniesha is on keeping a food journal and adhering to recommended goals of 1000-1200 calories and 75+ grams of protein daily and states she is following her eating plan approximately 90% of the time. Ilo states she is walking the dog for 30 minutes 3-4 times per week.  Today's visit was #: 75 Starting weight: 189 lbs Starting date: 10/12/2018 Today's weight: 173 lbs Today's date: 09/26/2020 Total lbs lost to date: 16 Total lbs lost since last in-office visit: 0  Interim History: Chivonne has been working on weight loss but she has stalled for the last few months, and is naturally frustrated by this. She doesn't always meet her calorie goals, but she does meet her protein goals. She sometimes uses protein supplements for her protein intake.  Subjective:   1. Essential hypertension Swayzie's blood pressure is well controlled on her medications. She is working on exercise and diet.  2. Pre-diabetes Halcyon is on metformin but notes some nausea, so she changed to PM but she wonders if she really needs this.  3. Other depression, with emotional eating Aerial is stable on Zoloft, and she notes she is sleeping reasonably well. Her mood appears normal with minimal emotional eating.  Assessment/Plan:   1. Essential hypertension Emryn is working on healthy weight loss and exercise to improve blood pressure control. We will watch for signs of hypotension as she continues her lifestyle modifications. We will refill hydrochlorothiazide for 1 month, and we will plan to recheck labs in 1 month.  - hydrochlorothiazide (HYDRODIURIL) 12.5 MG tablet; Take 1 tablet (12.5 mg total) by mouth daily.  Dispense: 30 tablet; Refill: 0  2. Pre-diabetes Devine will continue to work on weight loss, exercise, and decreasing simple carbohydrates to  help decrease the risk of diabetes. We will refill metformin for 1 month. Martyna is to make sure to take with food and is ok to decreased to 1/2 dose until GI upset resolves.  - metFORMIN (GLUCOPHAGE) 500 MG tablet; Take 1 tablet (500 mg total) by mouth daily with breakfast.  Dispense: 30 tablet; Refill: 0  3. Other depression, with emotional eating Behavior modification techniques were discussed today to help Delaine deal with her emotional/non-hunger eating behaviors. We will refill Zoloft for 1 month, and will continue to follow closely. Orders and follow up as documented in patient record.   - sertraline (ZOLOFT) 25 MG tablet; Take 1 tablet (25 mg total) by mouth daily.  Dispense: 30 tablet; Refill: 0  4. Class 1 obesity with serious comorbidity and body mass index (BMI) of 30.0 to 30.9 in adult, unspecified obesity type Lyan is currently in the action stage of change. As such, her goal is to continue with weight loss efforts. She has agreed to keeping a food journal and adhering to recommended goals of 1200 calories and 75+ grams of protein daily.   Exercise goals: As is.  Behavioral modification strategies: meal planning and cooking strategies and holiday eating strategies .  Nike has agreed to follow-up with our clinic in 3 weeks. She was informed of the importance of frequent follow-up visits to maximize her success with intensive lifestyle modifications for her multiple health conditions.   Objective:   Blood pressure 110/77, pulse 60, temperature 98 F (36.7 C), height 5\' 3"  (1.6 m), weight 173 lb (78.5 kg), last menstrual period 06/10/2011, SpO2 96 %.  Body mass index is 30.65 kg/m.  General: Cooperative, alert, well developed, in no acute distress. HEENT: Conjunctivae and lids unremarkable. Cardiovascular: Regular rhythm.  Lungs: Normal work of breathing. Neurologic: No focal deficits.   Lab Results  Component Value Date   CREATININE 0.67 08/20/2020   BUN 21  08/20/2020   NA 140 08/20/2020   K 4.3 08/20/2020   CL 103 08/20/2020   CO2 30 08/20/2020   Lab Results  Component Value Date   ALT 40 (H) 08/20/2020   AST 23 08/20/2020   ALKPHOS 50 02/13/2020   BILITOT 0.4 08/20/2020   Lab Results  Component Value Date   HGBA1C 5.4 11/23/2019   HGBA1C 5.2 06/22/2019   HGBA1C 5.3 01/23/2019   HGBA1C 5.6 10/12/2018   HGBA1C 6.1 01/28/2017   Lab Results  Component Value Date   INSULIN 10.3 11/23/2019   INSULIN 6.7 06/22/2019   INSULIN 6.2 01/23/2019   INSULIN 5.9 10/12/2018   Lab Results  Component Value Date   TSH 2.340 12/01/2019   Lab Results  Component Value Date   CHOL 164 08/20/2020   HDL 39 (L) 08/20/2020   LDLCALC 101 (H) 08/20/2020   LDLDIRECT 83.0 01/28/2017   TRIG 141 08/20/2020   CHOLHDL 4.2 08/20/2020   Lab Results  Component Value Date   WBC 5.8 08/20/2020   HGB 12.8 08/20/2020   HCT 40.0 08/20/2020   MCV 82.3 08/20/2020   PLT 184 08/20/2020   Lab Results  Component Value Date   IRON 52 12/12/2007    Obesity Behavioral Intervention:   Approximately 15 minutes were spent on the discussion below.  ASK: We discussed the diagnosis of obesity with [name] today and Vicki agreed to give [name] permission to discuss obesity behavioral modification therapy today.  ASSESS: Everli has the diagnosis of obesity and her BMI today is 30.65. Latrena is in the action stage of change.   ADVISE: Giannie was educated on the multiple health risks of obesity as well as the benefit of weight loss to improve her health. She was advised of the need for long term treatment and the importance of lifestyle modifications to improve her current health and to decrease her risk of future health problems.  AGREE: Multiple dietary modification options and treatment options were discussed and Tanith agreed to follow the recommendations documented in the above note.  ARRANGE: [name] was educated on the importance of frequent visits  to treat obesity as outlined per CMS and USPSTF guidelines and agreed to schedule her next follow up appointment today.  Attestation Statements:   Reviewed by clinician on day of visit: allergies, medications, problem list, medical history, surgical history, family history, social history, and previous encounter notes.   I, Trixie Dredge, am acting as transcriptionist for Dennard Nip, MD.  I have reviewed the above documentation for accuracy and completeness, and I agree with the above. -  Dennard Nip, MD

## 2020-10-02 NOTE — Progress Notes (Signed)
Subjective:   Kristin Delgado is a 64 y.o. female who presents for Medicare Annual (Subsequent) preventive examination.   I connected with [name] today by telephone and verified that I am speaking with the correct person using two identifiers. Location patient: home Location provider: work Persons participating in the virtual visit: patient, Marine scientist.    I discussed the limitations, risks, security and privacy concerns of performing an evaluation and management service by telephone and the availability of in person appointments. I also discussed with the patient that there may be a patient responsible charge related to this service. The patient expressed understanding and verbally consented to this telephonic visit.    Interactive audio and video telecommunications were attempted between this provider and patient, however failed, due to patient having technical difficulties OR patient did not have access to video capability.  We continued and completed visit with audio only.  Some vital signs may be absent or patient reported.   Time Spent with patient on telephone encounter: 20 minutes  Review of Systems     Cardiac Risk Factors include: obesity (BMI >30kg/m2);dyslipidemia     Objective:    Today's Vitals   10/03/20 0903  Weight: 173 lb (78.5 kg)  Height: 5\' 3"  (1.6 m)   Body mass index is 30.65 kg/m.  Advanced Directives 10/03/2020 08/08/2019 01/24/2018  Does Patient Have a Medical Advance Directive? No No No  Would patient like information on creating a medical advance directive? Yes (MAU/Ambulatory/Procedural Areas - Information given) No - Patient declined Yes (MAU/Ambulatory/Procedural Areas - Information given)    Current Medications (verified) Outpatient Encounter Medications as of 10/03/2020  Medication Sig  . acetaminophen (TYLENOL ARTHRITIS PAIN) 650 MG CR tablet Take 650 mg by mouth every 8 (eight) hours as needed. pain   . Ascorbic Acid (VITAMIN C) 1000 MG tablet  Take 1,000 mg by mouth 2 (two) times daily.    [name] atorvastatin (LIPITOR) 20 MG tablet Take 1 tablet (20 mg total) by mouth daily.  [name] b complex vitamins tablet Take 1 tablet by mouth daily.  . bromocriptine (PARLODEL) 2.5 MG tablet Take 1 tablet (2.5 mg total) by mouth daily.  . Calcium Acetate, Phos Binder, (CALCIUM ACETATE PO) Take by mouth.  . Calcium Polycarbophil (FIBER-CAPS PO) Take by mouth.  . cholecalciferol (VITAMIN D3) 25 MCG (1000 UT) tablet Take 5,000 Units by mouth daily.  . ferrous sulfate 325 (65 FE) MG EC tablet Take 325 mg by mouth 3 (three) times daily with meals.  . hydrochlorothiazide (HYDRODIURIL) 12.5 MG tablet Take 1 tablet (12.5 mg total) by mouth daily.  [name] lisinopril (ZESTRIL) 10 MG tablet Take 1 tablet (10 mg total) by mouth daily.  . metFORMIN (GLUCOPHAGE) 500 MG tablet Take 1 tablet (500 mg total) by mouth daily with breakfast.  . Multiple Vitamin (MULTIVITAMIN) tablet Take 1 tablet by mouth daily.    . Omega 3-6-9 Fatty Acids (OMEGA 3-6-9 COMPLEX PO) Take by mouth.  . Omega-3 Fatty Acids (FISH OIL) 1200 MG CAPS Take 1 capsule by mouth 2 (two) times daily.  . Probiotic Product (PROBIOTIC ADVANCED PO) Take by mouth.  . sertraline (ZOLOFT) 25 MG tablet Take 1 tablet (25 mg total) by mouth daily.   No facility-administered encounter medications on file as of 10/03/2020.    Allergies (verified) Patient has no known allergies.   History: Past Medical History:  Diagnosis Date  . Allergy    seasonal  . Anxiety    closed space like MRI  . Back  pain \  . Chest pain 2006   admitted, essentially negative  . Constipation   . Depression   . GERD (gastroesophageal reflux disease)   . Hyperlipidemia   . Hypertension   . Infertility, female   . Joint pain   . Pituitary microadenoma ([place])   . Swallowing difficulty   . 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 Vitamin D deficiency    Past Surgical History:  Procedure Laterality Date  . 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 BRAIN SURGERY  1 / 1985   microadenoma  . COCHLEAR IMPLANT  02/2013   Left side   Family History  Problem Relation Age of Onset  . Lung cancer Mother   . Cancer Mother 66       lung   . Alzheimer's disease Father   . Dementia Father    Social History   Socioeconomic History  . Marital status: Married    Spouse name: Annie Main  . Number of children: 3  . Years of education: Not on file  . Highest education level: Not on file  Occupational History  . 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 Smoking status: Never Smoker  . Smokeless tobacco: Never Used  Vaping Use  . Vaping Use: Never used  Substance and Sexual Activity  . Alcohol use: No    Comment: rare  . Drug use: No  . Sexual activity: Not Currently    Partners: Male  Other Topics Concern  . Not on file  Social History Narrative   Exercise-- daily --- walking, 3x a week DVD   Social Determinants of Health   Financial Resource Strain: Low Risk   . Difficulty of Paying Living Expenses: Not hard at all  Food Insecurity: No Food Insecurity  . Worried About Charity fundraiser in the Last Year: Never true  . Ran Out of Food in the Last Year: Never true  Transportation Needs: No Transportation Needs  . Lack of Transportation (Medical): No  . Lack of Transportation (Non-Medical): No  Physical Activity: Sufficiently Active  . Days of Exercise per Week: 7 days  . Minutes of Exercise per Session: 30 min  Stress: No Stress Concern Present  . Feeling of Stress : Only a little  Social Connections: Moderately Isolated  . Frequency of Communication with Friends and Family: More than three times a week  . Frequency of Social Gatherings with Friends and Family: Never  . Attends Religious Services: Never  . Active Member of Clubs or Organizations: No  . Attends Archivist Meetings: Never  . Marital Status: Married    Tobacco Counseling Counseling given: Not Answered   Clinical Intake:  Pre-visit  preparation completed: Yes  Pain : No/denies pain     Nutritional Status: BMI > 30  Obese Nutritional Risks: None Diabetes: No  How often do you need to have someone help you when you read instructions, pamphlets, or other written materials from your doctor or pharmacy?: 1 - Never What is the last grade level you completed in school?: Associate's degree  Diabetic?No  Interpreter Needed?: No  Information entered by :: Caroleen Hamman LPN   Activities of Daily Living In your present state of health, do you have any difficulty performing the following activities: 10/03/2020 08/20/2020  Hearing? Y Y  Comment deaf in left ear -  Vision? N N  Difficulty concentrating or making decisions? N N  Walking or climbing stairs? N Y  Dressing or bathing? N  N  Doing errands, shopping? N N  Preparing Food and eating ? N -  Using the Toilet? N -  In the past six months, have you accidently leaked urine? Y -  Comment occasionally -  Do you have problems with loss of bowel control? N -  Managing your Medications? N -  Managing your Finances? N -  Housekeeping or managing your Housekeeping? N -  Some recent data might be hidden    Patient Care Team: Carollee Herter, Alferd Apa, DO as PCP - General Poudyal, Ritesh, OD (Optometry) Sanjuana Kava, MD as Referring Physician (Otolaryngology) [facility] as Consulting Physician Laqueta Linden, MD as Consulting Physician [facility])  Indicate any recent Medical Services you may have received from other than [facility] providers in the past year (date may be approximate).     Assessment:   This is a routine wellness examination for Bessy.  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  Weight (lb) < 175 lb (79.4 kg) (pt-stated)      Exercise and drink more water.      Depression Screen PHQ 2/9 Scores 10/03/2020 08/20/2020 08/08/2019 10/12/2018 01/24/2018 07/14/2013  PHQ - 2 Score 1 4 0 2 0 0  PHQ- 9 Score - 10 - 8 - -  Exception Documentation - - - Medical reason - -    Fall Risk Fall Risk  10/03/2020 08/20/2020 08/08/2019 01/24/2018  Falls in the past year? 0 0 1 No  Number falls in past yr: 0 0 1 -  Injury with Fall? 0 0 0 -  Risk for fall due to : Impaired balance/gait - - -  Follow up Falls prevention discussed Falls evaluation completed Education provided;Falls prevention discussed -    Any stairs in or around the home? Yes  If so, are there any without handrails? No  Home free of loose throw rugs in walkways, pet beds, electrical cords, etc? Yes  Adequate lighting in your home to reduce risk of falls? Yes   ASSISTIVE DEVICES UTILIZED TO PREVENT FALLS:  Life alert? No  Use of a cane, walker or w/c? No  Grab bars in the bathroom? Yes  Shower chair or bench in shower? No  Elevated toilet seat or a handicapped toilet? No   TIMED UP AND GO:  Was the test performed? No . Phone visit   Cognitive Function:No cognitive impairment noted MMSE - Mini Mental State Exam 01/24/2018  Orientation to time 5  Orientation to Place 5  Registration 3  Attention/ Calculation 5  Recall 3  Language- name 2 objects 2  Language- repeat 1  Language- follow 3 step command 3  Language- read & follow direction 1  Write a sentence 1  Copy design 1  Total score 30        Immunizations Immunization History  Administered Date(s) Administered  . Tdap 06/10/2011  . Zoster 07/25/2015    TDAP status: Up to date  Flu Vaccine status:  Declined, Education has been provided regarding the importance of this vaccine but patient still declined. Advised may receive this vaccine at local pharmacy or Health Dept. Aware to provide a copy of the vaccination record if obtained from local pharmacy or Health Dept. Verbalized acceptance and understanding.   Pneumococcal vaccine status: Due- Patient to discuss with PCP at next office visit.  Covid-19 vaccine status: Declined, Education has been provided regarding the importance of this vaccine but patient still declined. Advised may receive this vaccine at local pharmacy or Health Dept.or vaccine clinic. Aware to provide a copy of the vaccination record if obtained from local pharmacy or Health Dept. Verbalized acceptance and understanding.  Qualifies for Shingles Vaccine? Yes   Zostavax completed Yes   Shingrix Completed?: No.    Education has been provided regarding the importance of this vaccine. Patient has been advised to call insurance company to determine out of pocket expense if they have not yet received this vaccine. Advised may also receive vaccine at local pharmacy or Health Dept. Verbalized acceptance and understanding.  Screening Tests Health Maintenance  Topic Date Due  . COVID-19 Vaccine (1) Never done  . PAP SMEAR-Modifier  01/27/2019  . DEXA SCAN  Never done  . INFLUENZA VACCINE  02/13/2021 (Originally 06/16/2020)  . MAMMOGRAM  08/20/2021 (Originally 08/23/2015)  . PNA vac Low Risk Adult (1 of 2 - PCV13) 08/20/2021 (Originally 10/15/2019)  . TETANUS/TDAP  06/09/2021  . COLONOSCOPY  08/11/2021  . Hepatitis C Screening  Completed  . HIV Screening  Completed    Health Maintenance  Health Maintenance Due  Topic Date Due  . COVID-19 Vaccine (1) Never done  . PAP SMEAR-Modifier  01/27/2019  . DEXA SCAN  Never done    Colorectal cancer screening: Completed Colonoscopy 08/12/2011. Repeat every 10 years   Mammogram-Due- Declined  Bone Density status: Ordered today. Pt  provided with contact info and advised to call to schedule appt.  Lung Cancer Screening: (Low Dose CT Chest recommended if Age 25-80 years, 30 pack-year currently smoking OR have quit w/in 15years.) does not qualify.     Additional Screening:  Hepatitis C Screening:Completed 01/27/2016  Vision Screening: Recommended annual ophthalmology exams for early detection of glaucoma and other disorders of the eye. Is the patient up to date with their annual eye exam?  No  Who is the provider or what is the name of the office in which the patient attends annual eye exams? Unsure of name Patient plans to call and make an appt.  Dental Screening: Recommended annual dental exams for proper oral hygiene  Community Resource Referral / Chronic Care Management: CRR required this visit?  No   CCM required this visit?  No      Plan:     I have personally reviewed and noted the following in the patient's chart:   . Medical and social history . Use of alcohol, tobacco or illicit drugs  . Current medications and supplements . Functional ability and status . Nutritional status . Physical activity . Advanced directives . List of other physicians . Hospitalizations, surgeries, and ER visits in previous 12 months . Vitals . Screenings to include cognitive, depression, and falls . Referrals and appointments  In addition, I have reviewed and discussed with patient certain preventive protocols, quality metrics, and best practice recommendations. A written personalized care plan for preventive services as well as general preventive health recommendations were provided to patient.    Due to this being a  telephonic visit, the after visit summary with patients personalized plan was offered to patient via mail or my-chart.  Patient would like to access on my-chart.   Marta Antu, LPN   [id number]  Nurse Health Advisor  Nurse Notes: None

## 2020-10-03 ENCOUNTER — Ambulatory Visit (INDEPENDENT_AMBULATORY_CARE_PROVIDER_SITE_OTHER): Payer: Medicare Other

## 2020-10-03 VITALS — Ht 63.0 in | Wt 173.0 lb

## 2020-10-03 DIAGNOSIS — Z780 Asymptomatic menopausal state: Secondary | ICD-10-CM

## 2020-10-03 DIAGNOSIS — Z0000 Encounter for general adult medical examination without abnormal findings: Secondary | ICD-10-CM

## 2020-10-03 NOTE — Patient Instructions (Signed)
[name] , Thank you for taking time to complete your Medicare Wellness Visit. I appreciate your ongoing commitment to your health goals. Please review the following plan we discussed and let me know if I can assist you in the future.   Screening recommendations/referrals: Colonoscopy: Completed 08/12/2011-Due 08/11/2021 Mammogram: Declined Bone Density: Ordered today. Someone will be calling you to schedule. Recommended yearly ophthalmology/optometry visit for glaucoma screening and checkup Recommended yearly dental visit for hygiene and checkup  Vaccinations: Influenza vaccine: Declined Pneumococcal vaccine: Due- May obtain vaccine in our office or your local pharmacy. Tdap vaccine: Up to date- Due-06/09/2021 Shingles vaccine: Discuss with pharmacy Covid-19:Declined  Advanced directives: Information mailed today  Conditions/risks identified: See problem list  Next appointment: Follow up in one year for your annual wellness visit    Preventive Care 65 Years and Older, Female Preventive care refers to lifestyle choices and visits with your health care provider that can promote health and wellness. What does preventive care include?  A yearly physical exam. This is also called an annual well check.  Dental exams once or twice a year.  Routine eye exams. Ask your health care provider how often you should have your eyes checked.  Personal lifestyle choices, including:  Daily care of your teeth and gums.  Regular physical activity.  Eating a healthy diet.  Avoiding tobacco and drug use.  Limiting alcohol use.  Practicing safe sex.  Taking low-dose aspirin every day.  Taking vitamin and mineral supplements as recommended by your health care provider. What happens during an annual well check? The services and screenings done by your health care provider during your annual well check will depend on your age, overall health, lifestyle risk factors, and family history of  disease. Counseling  Your health care provider may ask you questions about your:  Alcohol use.  Tobacco use.  Drug use.  Emotional well-being.  Home and relationship well-being.  Sexual activity.  Eating habits.  History of falls.  Memory and ability to understand (cognition).  Work and work Statistician.  Reproductive health. Screening  You may have the following tests or measurements:  Height, weight, and BMI.  Blood pressure.  Lipid and cholesterol levels. These may be checked every 5 years, or more frequently if you are over 68 years old.  Skin check.  Lung cancer screening. You may have this screening every year starting at age 18 if you have a 30-pack-year history of smoking and currently smoke or have quit within the past 15 years.  Fecal occult blood test (FOBT) of the stool. You may have this test every year starting at age 48.  Flexible sigmoidoscopy or colonoscopy. You may have a sigmoidoscopy every 5 years or a colonoscopy every 10 years starting at age 86.  Hepatitis C blood test.  Hepatitis B blood test.  Sexually transmitted disease (STD) testing.  Diabetes screening. This is done by checking your blood sugar (glucose) after you have not eaten for a while (fasting). You may have this done every 1-3 years.  Bone density scan. This is done to screen for osteoporosis. You may have this done starting at age 1.  Mammogram. This may be done every 1-2 years. Talk to your health care provider about how often you should have regular mammograms. Talk with your health care provider about your test results, treatment options, and if necessary, the need for more tests. Vaccines  Your health care provider may recommend certain vaccines, such as:  Influenza vaccine. This is recommended  every year.  Tetanus, diphtheria, and acellular pertussis (Tdap, Td) vaccine. You may need a Td booster every 10 years.  Zoster vaccine. You may need this after age  19.  Pneumococcal 13-valent conjugate (PCV13) vaccine. One dose is recommended after age 86.  Pneumococcal polysaccharide (PPSV23) vaccine. One dose is recommended after age 70. Talk to your health care provider about which screenings and vaccines you need and how often you need them. This information is not intended to replace advice given to you by your health care provider. Make sure you discuss any questions you have with your health care provider. Document Released: 11/29/2015 Document Revised: 07/22/2016 Document Reviewed: 09/03/2015 Elsevier Interactive Patient Education  2017 [place] Prevention in the Home Falls can cause injuries. They can happen to people of all ages. There are many things you can do to make your home safe and to help prevent falls. What can I do on the outside of my home?  Regularly fix the edges of walkways and driveways and fix any cracks.  Remove anything that might make you trip as you walk through a door, such as a raised step or threshold.  Trim any bushes or trees on the path to your home.  Use bright outdoor lighting.  Clear any walking paths of anything that might make someone trip, such as rocks or tools.  Regularly check to see if handrails are loose or broken. Make sure that both sides of any steps have handrails.  Any raised decks and porches should have guardrails on the edges.  Have any leaves, snow, or ice cleared regularly.  Use sand or salt on walking paths during winter.  Clean up any spills in your garage right away. This includes oil or grease spills. What can I do in the bathroom?  Use night lights.  Install grab bars by the toilet and in the tub and shower. Do not use towel bars as grab bars.  Use non-skid mats or decals in the tub or shower.  If you need to sit down in the shower, use a plastic, non-slip stool.  Keep the floor dry. Clean up any water that spills on the floor as soon as it happens.  Remove  soap buildup in the tub or shower regularly.  Attach bath mats securely with double-sided non-slip rug tape.  Do not have throw rugs and other things on the floor that can make you trip. What can I do in the bedroom?  Use night lights.  Make sure that you have a light by your bed that is easy to reach.  Do not use any sheets or blankets that are too big for your bed. They should not hang down onto the floor.  Have a firm chair that has side arms. You can use this for support while you get dressed.  Do not have throw rugs and other things on the floor that can make you trip. What can I do in the kitchen?  Clean up any spills right away.  Avoid walking on wet floors.  Keep items that you use a lot in easy-to-reach places.  If you need to reach something above you, use a strong step stool that has a grab bar.  Keep electrical cords out of the way.  Do not use floor polish or wax that makes floors slippery. If you must use wax, use non-skid floor wax.  Do not have throw rugs and other things on the floor that can make you trip. What  can I do with my stairs?  Do not leave any items on the stairs.  Make sure that there are handrails on both sides of the stairs and use them. Fix handrails that are broken or loose. Make sure that handrails are as long as the stairways.  Check any carpeting to make sure that it is firmly attached to the stairs. Fix any carpet that is loose or worn.  Avoid having throw rugs at the top or bottom of the stairs. If you do have throw rugs, attach them to the floor with carpet tape.  Make sure that you have a light switch at the top of the stairs and the bottom of the stairs. If you do not have them, ask someone to add them for you. What else can I do to help prevent falls?  Wear shoes that:  Do not have high heels.  Have rubber bottoms.  Are comfortable and fit you well.  Are closed at the toe. Do not wear sandals.  If you use a  stepladder:  Make sure that it is fully opened. Do not climb a closed stepladder.  Make sure that both sides of the stepladder are locked into place.  Ask someone to hold it for you, if possible.  Clearly mark and make sure that you can see:  Any grab bars or handrails.  First and last steps.  Where the edge of each step is.  Use tools that help you move around (mobility aids) if they are needed. These include:  Canes.  Walkers.  Scooters.  Crutches.  Turn on the lights when you go into a dark area. Replace any light bulbs as soon as they burn out.  Set up your furniture so you have a clear path. Avoid moving your furniture around.  If any of your floors are uneven, fix them.  If there are any pets around you, be aware of where they are.  Review your medicines with your doctor. Some medicines can make you feel dizzy. This can increase your chance of falling. Ask your doctor what other things that you can do to help prevent falls. This information is not intended to replace advice given to you by your health care provider. Make sure you discuss any questions you have with your health care provider. Document Released: 08/29/2009 Document Revised: 04/09/2016 Document Reviewed: 12/07/2014 Elsevier Interactive Patient Education  2017 [employer].

## 2020-10-17 ENCOUNTER — Encounter (INDEPENDENT_AMBULATORY_CARE_PROVIDER_SITE_OTHER): Payer: Self-pay | Admitting: Family Medicine

## 2020-10-17 ENCOUNTER — Ambulatory Visit (INDEPENDENT_AMBULATORY_CARE_PROVIDER_SITE_OTHER): Payer: Medicare Other | Admitting: Family Medicine

## 2020-10-17 ENCOUNTER — Other Ambulatory Visit: Payer: Self-pay

## 2020-10-17 VITALS — BP 136/83 | HR 60 | Temp 97.4°F | Ht 63.0 in | Wt 174.0 lb

## 2020-10-17 DIAGNOSIS — E669 Obesity, unspecified: Secondary | ICD-10-CM | POA: Diagnosis not present

## 2020-10-17 DIAGNOSIS — F3289 Other specified depressive episodes: Secondary | ICD-10-CM

## 2020-10-17 DIAGNOSIS — R7303 Prediabetes: Secondary | ICD-10-CM

## 2020-10-17 DIAGNOSIS — Z6830 Body mass index (BMI) 30.0-30.9, adult: Secondary | ICD-10-CM | POA: Diagnosis not present

## 2020-10-17 DIAGNOSIS — I10 Essential (primary) hypertension: Secondary | ICD-10-CM

## 2020-10-22 MED ORDER — METFORMIN HCL 500 MG PO TABS: 500.0000 mg | ORAL_TABLET | Freq: Every day | ORAL | 0 refills | Status: DC

## 2020-10-22 MED ORDER — HYDROCHLOROTHIAZIDE 12.5 MG PO TABS: 12.5000 mg | ORAL_TABLET | Freq: Every day | ORAL | 0 refills | Status: DC

## 2020-10-22 MED ORDER — SERTRALINE HCL 25 MG PO TABS: 25.0000 mg | ORAL_TABLET | Freq: Every day | ORAL | 0 refills | Status: DC

## 2020-10-22 NOTE — Progress Notes (Signed)
Chief Complaint:   OBESITY Kristin Delgado is here to discuss her progress with her obesity treatment plan along with follow-up of her obesity related diagnoses. Kristin Delgado is on keeping a food journal and adhering to recommended goals of 1200 calories and 75+ grams of protein daily and states she is following her eating plan approximately 95% of the time. Kristin Delgado states she is walking for 30 minutes 5-7 times per week.  Today's visit was #: 38 Starting weight: 189 lbs Starting date: 10/12/2018 Today's weight: 174 lbs Today's date: 10/17/2020 Total lbs lost to date: 15 Total lbs lost since last in-office visit: 0  Interim History: Kristin Delgado has done well minimizing weight gain over Thanksgiving. She is disappointed in herself that she didn't lose weight and she has been weighing herself at home more often than ideal which discourages her even more.  Subjective:   1. Pre-diabetes Kristin Delgado is working on diet and weight loss, and she is tolerating metformin. She denies nausea or vomiting.   2. Essential hypertension Kristin Delgado's blood pressure is stable today. She is tolerating hydrochlorothiazide well. She requests a refill today.  3. Other depression, with emotional eating Kristin Delgado is stable on Zoloft, and still struggling with emotional eating. She requests a refill today.  Assessment/Plan:   1. Pre-diabetes Kristin Delgado will continue to work on weight loss, exercise, and decreasing simple carbohydrates to help decrease the risk of diabetes. We will metformin for 1 month.  - metFORMIN (GLUCOPHAGE) 500 MG tablet; Take 1 tablet (500 mg total) by mouth daily with breakfast.  Dispense: 30 tablet; Refill: 0  2. Essential hypertension Kristin Delgado is working on healthy weight loss and exercise to improve blood pressure control. We will watch for signs of hypotension as she continues her lifestyle modifications. We will refill hydrochlorothiazide for 1 month.  - hydrochlorothiazide (HYDRODIURIL) 12.5 MG  tablet; Take 1 tablet (12.5 mg total) by mouth daily.  Dispense: 30 tablet; Refill: 0  3. Other depression, with emotional eating Behavior modification techniques were discussed today to help Kristin Delgado deal with her emotional/non-hunger eating behaviors. We will refill Zoloft and she was offered support and encouragement. Orders and follow up as documented in patient record.   - sertraline (ZOLOFT) 25 MG tablet; Take 1 tablet (25 mg total) by mouth daily.  Dispense: 30 tablet; Refill: 0  4. Class 1 obesity with serious comorbidity and body mass index (BMI) of 30.0 to 30.9 in adult, unspecified obesity type Kristin Delgado is currently in the action stage of change. As such, her goal is to continue with weight loss efforts. She has agreed to keeping a food journal and adhering to recommended goals of 1200 calories and 75+ grams of protein daily.   Kristin Delgado was encouraged to be realistic about her goals, and I congratulated on minimizing holiday weight gain.  Exercise goals: As is.  Behavioral modification strategies: emotional eating strategies and holiday eating strategies .  Kristin Delgado has agreed to follow-up with our clinic in 2 to 3 weeks. She was informed of the importance of frequent follow-up visits to maximize her success with intensive lifestyle modifications for her multiple health conditions.   Objective:   Blood pressure 136/83, pulse 60, temperature (!) 97.4 F (36.3 C), height 5\' 3"  (1.6 m), weight 174 lb (78.9 kg), last menstrual period 06/10/2011, SpO2 97 %. Body mass index is 30.82 kg/m.  General: Cooperative, alert, well developed, in no acute distress. HEENT: Conjunctivae and lids unremarkable. Cardiovascular: Regular rhythm.  Lungs: Normal work of breathing. Neurologic: No focal  deficits.   Lab Results  Component Value Date   CREATININE 0.67 08/20/2020   BUN 21 08/20/2020   NA 140 08/20/2020   K 4.3 08/20/2020   CL 103 08/20/2020   CO2 30 08/20/2020   Lab Results   Component Value Date   ALT 40 (H) 08/20/2020   AST 23 08/20/2020   ALKPHOS 50 02/13/2020   BILITOT 0.4 08/20/2020   Lab Results  Component Value Date   HGBA1C 5.4 11/23/2019   HGBA1C 5.2 06/22/2019   HGBA1C 5.3 01/23/2019   HGBA1C 5.6 10/12/2018   HGBA1C 6.1 01/28/2017   Lab Results  Component Value Date   INSULIN 10.3 11/23/2019   INSULIN 6.7 06/22/2019   INSULIN 6.2 01/23/2019   INSULIN 5.9 10/12/2018   Lab Results  Component Value Date   TSH 2.340 12/01/2019   Lab Results  Component Value Date   CHOL 164 08/20/2020   HDL 39 (L) 08/20/2020   LDLCALC 101 (H) 08/20/2020   LDLDIRECT 83.0 01/28/2017   TRIG 141 08/20/2020   CHOLHDL 4.2 08/20/2020   Lab Results  Component Value Date   WBC 5.8 08/20/2020   HGB 12.8 08/20/2020   HCT 40.0 08/20/2020   MCV 82.3 08/20/2020   PLT 184 08/20/2020   Lab Results  Component Value Date   IRON 52 12/12/2007    Obesity Behavioral Intervention:   Approximately 15 minutes were spent on the discussion below.  ASK: We discussed the diagnosis of obesity with Kristin Delgado today and Kristin Delgado agreed to give [name] permission to discuss obesity behavioral modification therapy today.  ASSESS: Kristin Delgado has the diagnosis of obesity and her BMI today is 30.83. Kristin Delgado is in the action stage of change.   ADVISE: Kristin Delgado was educated on the multiple health risks of obesity as well as the benefit of weight loss to improve her health. She was advised of the need for long term treatment and the importance of lifestyle modifications to improve her current health and to decrease her risk of future health problems.  AGREE: Multiple dietary modification options and treatment options were discussed and Kristin Delgado agreed to follow the recommendations documented in the above note.  ARRANGE: Kristin Delgado was educated on the importance of frequent visits to treat obesity as outlined per CMS and USPSTF guidelines and agreed to schedule her next follow up  appointment today.  Attestation Statements:   Reviewed by clinician on day of visit: allergies, medications, problem list, medical history, surgical history, family history, social history, and previous encounter notes.   I, Trixie Dredge, am acting as transcriptionist for Dennard Nip, MD.  I have reviewed the above documentation for accuracy and completeness, and I agree with the above. -  Dennard Nip, MD

## 2020-11-05 ENCOUNTER — Other Ambulatory Visit: Payer: Self-pay

## 2020-11-05 ENCOUNTER — Encounter (INDEPENDENT_AMBULATORY_CARE_PROVIDER_SITE_OTHER): Payer: Self-pay | Admitting: Family Medicine

## 2020-11-05 ENCOUNTER — Ambulatory Visit (INDEPENDENT_AMBULATORY_CARE_PROVIDER_SITE_OTHER): Payer: Medicare Other | Admitting: Family Medicine

## 2020-11-05 VITALS — BP 113/76 | HR 62 | Temp 97.4°F | Ht 63.0 in | Wt 170.0 lb

## 2020-11-05 DIAGNOSIS — E8881 Metabolic syndrome: Secondary | ICD-10-CM

## 2020-11-05 DIAGNOSIS — E669 Obesity, unspecified: Secondary | ICD-10-CM | POA: Diagnosis not present

## 2020-11-05 DIAGNOSIS — Z6830 Body mass index (BMI) 30.0-30.9, adult: Secondary | ICD-10-CM

## 2020-11-06 NOTE — Progress Notes (Signed)
Chief Complaint:   OBESITY Kristin Delgado is here to discuss her progress with her obesity treatment plan along with follow-up of her obesity related diagnoses. Kristin Delgado is on keeping a food journal and adhering to recommended goals of 1200 calories and 75+ grams of protein daily and states she is following her eating plan approximately 75% of the time. Kristin Delgado states she is walking for 30 minutes 5 times per week.  Today's visit was #: 50 Starting weight: 189 lbs Starting date: 10/12/2018 Today's weight: 170 lbs Today's date: 11/05/2020 Total lbs lost to date: 19 Total lbs lost since last in-office visit: 4  Interim History: Kristin Delgado continues to do very well with weight loss. She is journaling and trying to meet her protein. She isn't always eating enough calories however.  Subjective:   1. Insulin resistance Kristin Delgado has questions about insulin resistance and metformin, and if she needs to stay on the medication.  Assessment/Plan:   1. Insulin resistance Kristin Delgado will continue to work on weight loss, exercise, and decreasing simple carbohydrates to help decrease the risk of diabetes. Kristin Delgado was educated on insulin resistance including how diet affects her weight versus metformin. She agreed to continue metformin for now, and she agreed to follow-up with [name] as directed to closely monitor her progress.  2. Class 1 obesity with serious comorbidity and body mass index (BMI) of 30.0 to 30.9 in adult, unspecified obesity type Kristin Delgado is currently in the action stage of change. As such, her goal is to continue with weight loss efforts. She has agreed to keeping a food journal and adhering to recommended goals of 1200-1300 calories and 75+ grams of protein daily.   Exercise goals: As is.  Behavioral modification strategies: increasing lean protein intake, meal planning and cooking strategies and holiday eating strategies .  Kristin Delgado has agreed to follow-up with our clinic in 4 weeks. She was  informed of the importance of frequent follow-up visits to maximize her success with intensive lifestyle modifications for her multiple health conditions.   Objective:   Blood pressure 113/76, pulse 62, temperature (!) 97.4 F (36.3 C), height 5\' 3"  (1.6 m), weight 170 lb (77.1 kg), last menstrual period 06/10/2011, SpO2 100 %. Body mass index is 30.11 kg/m.  General: Cooperative, alert, well developed, in no acute distress. HEENT: Conjunctivae and lids unremarkable. Cardiovascular: Regular rhythm.  Lungs: Normal work of breathing. Neurologic: No focal deficits.   Lab Results  Component Value Date   CREATININE 0.67 08/20/2020   BUN 21 08/20/2020   NA 140 08/20/2020   K 4.3 08/20/2020   CL 103 08/20/2020   CO2 30 08/20/2020   Lab Results  Component Value Date   ALT 40 (H) 08/20/2020   AST 23 08/20/2020   ALKPHOS 50 02/13/2020   BILITOT 0.4 08/20/2020   Lab Results  Component Value Date   HGBA1C 5.4 11/23/2019   HGBA1C 5.2 06/22/2019   HGBA1C 5.3 01/23/2019   HGBA1C 5.6 10/12/2018   HGBA1C 6.1 01/28/2017   Lab Results  Component Value Date   INSULIN 10.3 11/23/2019   INSULIN 6.7 06/22/2019   INSULIN 6.2 01/23/2019   INSULIN 5.9 10/12/2018   Lab Results  Component Value Date   TSH 2.340 12/01/2019   Lab Results  Component Value Date   CHOL 164 08/20/2020   HDL 39 (L) 08/20/2020   LDLCALC 101 (H) 08/20/2020   LDLDIRECT 83.0 01/28/2017   TRIG 141 08/20/2020   CHOLHDL 4.2 08/20/2020   Lab Results  Component  Value Date   WBC 5.8 08/20/2020   HGB 12.8 08/20/2020   HCT 40.0 08/20/2020   MCV 82.3 08/20/2020   PLT 184 08/20/2020   Lab Results  Component Value Date   IRON 52 12/12/2007   Attestation Statements:   Reviewed by clinician on day of visit: allergies, medications, problem list, medical history, surgical history, family history, social history, and previous encounter notes.  Time spent on visit including pre-visit chart review and post-visit  care and charting was 30 minutes.    I, Trixie Dredge, am acting as transcriptionist for Dennard Nip, MD.  I have reviewed the above documentation for accuracy and completeness, and I agree with the above. -  Dennard Nip, MD

## 2020-11-19 ENCOUNTER — Other Ambulatory Visit: Payer: Self-pay

## 2020-11-19 ENCOUNTER — Encounter (INDEPENDENT_AMBULATORY_CARE_PROVIDER_SITE_OTHER): Payer: Self-pay | Admitting: Family Medicine

## 2020-11-19 ENCOUNTER — Ambulatory Visit (INDEPENDENT_AMBULATORY_CARE_PROVIDER_SITE_OTHER): Payer: Medicare Other | Admitting: Family Medicine

## 2020-11-19 VITALS — BP 151/86 | HR 55 | Temp 97.7°F | Ht 63.0 in | Wt 174.0 lb

## 2020-11-19 DIAGNOSIS — E8881 Metabolic syndrome: Secondary | ICD-10-CM | POA: Diagnosis not present

## 2020-11-19 DIAGNOSIS — F3289 Other specified depressive episodes: Secondary | ICD-10-CM

## 2020-11-19 DIAGNOSIS — I10 Essential (primary) hypertension: Secondary | ICD-10-CM

## 2020-11-19 DIAGNOSIS — E669 Obesity, unspecified: Secondary | ICD-10-CM | POA: Diagnosis not present

## 2020-11-19 DIAGNOSIS — Z6830 Body mass index (BMI) 30.0-30.9, adult: Secondary | ICD-10-CM | POA: Diagnosis not present

## 2020-11-19 MED ORDER — LISINOPRIL 10 MG PO TABS: 10.0000 mg | ORAL_TABLET | Freq: Every day | ORAL | 0 refills | Status: DC

## 2020-11-19 MED ORDER — METFORMIN HCL 500 MG PO TABS: 500.0000 mg | ORAL_TABLET | Freq: Every day | ORAL | 0 refills | Status: DC

## 2020-11-19 MED ORDER — SERTRALINE HCL 25 MG PO TABS: 25.0000 mg | ORAL_TABLET | Freq: Every day | ORAL | 0 refills | Status: DC

## 2020-11-19 MED ORDER — HYDROCHLOROTHIAZIDE 12.5 MG PO TABS
12.5000 mg | ORAL_TABLET | Freq: Every day | ORAL | 0 refills | Status: DC
Start: 2020-11-19 — End: 2020-12-19

## 2020-11-19 NOTE — Progress Notes (Signed)
Chief Complaint:   OBESITY Kristin Delgado is here to discuss her progress with her obesity treatment plan along with follow-up of her obesity related diagnoses. Kristin Delgado is on keeping a food journal and adhering to recommended goals of 1250  calories and 75 grams of  protein daily and states she is following her eating plan approximately 50% of the time. Kristin Delgado states she is walking the dog 1 1/4 mile 3-4 times per week.  Today's visit was #: 44 Starting weight: 189 lbs Starting date: 10/12/2018 Today's weight: 174 lbs Today's date: 11/19/2020 Total lbs lost to date: 15 lbs Total lbs lost since last in-office visit: 0  Interim History: Kristin Delgado states she has followed plan 50 percent of the time. Kristin Delgado has started crafting again using a home loom kit. Kristin Delgado mentions she likely got distracted at the end of December and so wasn't on plan as she normally is. Thinking she will get together with her sister in upcoming weeks. Eating only 1100 calories a day.  Subjective:   1. Essential hypertension Blood pressure is elevated today(last 3 appointments BP within normal limits). No chest pain, chest pressure, or headache.  BP Readings from Last 3 Encounters:  11/19/20 (!) 151/86  11/05/20 113/76  10/17/20 136/83    2. Other depression, with emotional eating Symptoms Kristin Delgado is struggling with emotional eating and using food for comfort to the extent that it is negatively impacting her health. She has been working on behavior modification techniques to help reduce her emotional eating and has been somewhat successful. She shows no sign of suicidal or homicidal ideations. Symptoms are better controlled on Sertraline.  3. Insulin resistance Kristin Delgado is on Metformin. No GI side effects. Last insulin level was 8.8.   Assessment/Plan:   1. Essential hypertension Kristin Delgado is working on healthy weight loss and exercise to improve blood pressure control. We will watch for signs of hypotension as  she continues her lifestyle modifications.  - lisinopril (ZESTRIL) 10 MG tablet; Take 1 tablet (10 mg total) by mouth daily.  Dispense: 30 tablet; Refill: 0 - hydrochlorothiazide (HYDRODIURIL) 12.5 MG tablet; Take 1 tablet (12.5 mg total) by mouth daily.  Dispense: 30 tablet; Refill: 0  2. Other depression, with emotional eating Behavior modification techniques were discussed today to help Kristin Delgado deal with her emotional/non-hunger eating behaviors.  Orders and follow up as documented in patient record.  - sertraline (ZOLOFT) 25 MG tablet; Take 1 tablet (25 mg total) by mouth daily.  Dispense: 30 tablet; Refill: 0  3. Insulin resistance Good blood sugar control is important to decrease the likelihood of diabetic complications such as nephropathy, neuropathy, limb loss, blindness, coronary artery disease, and death. Intensive lifestyle modification including diet, exercise and weight loss are the first line of treatment for diabetes.  - metFORMIN (GLUCOPHAGE) 500 MG tablet; Take 1 tablet (500 mg total) by mouth daily with breakfast.  Dispense: 30 tablet; Refill: 0  4. Class 1 obesity with serious comorbidity and body mass index (BMI) of 30.0 to 30.9 in adult, unspecified obesity type  Kristin Delgado is currently in the action stage of change. As such, her goal is to continue with weight loss efforts. She has agreed to keeping a food journal and adhering to recommended goals of 1250 calories and 80 grams of  protein daily  Exercise goals: No exercise goals at this time.   Behavioral modification strategies: increasing lean protein intake, meal planning and cooking strategies, keeping healthy foods in the home and keeping a strict  food journal.  Kristin Delgado has agreed to follow-up with our clinic in 4 weeks. She was informed of the importance of frequent follow-up visits to maximize her success with intensive lifestyle modifications for her multiple health conditions.    Objective:   Blood pressure  (!) 151/86, pulse (!) 55, temperature 97.7 F (36.5 C), temperature source Oral, height _0  (1.6 m), weight 174 lb (78.9 kg), last menstrual period 06/10/2011, SpO2 98 %. Body mass index is 30.82 kg/m.  General: Cooperative, alert, well developed, in no acute distress. HEENT: Conjunctivae and lids unremarkable. Cardiovascular: Regular rhythm.  Lungs: Normal work of breathing. Neurologic: No focal deficits.   Lab Results  Component Value Date   CREATININE 0.67 08/20/2020   BUN 21 08/20/2020   NA 140 08/20/2020   K 4.3 08/20/2020   CL 103 08/20/2020   CO2 30 08/20/2020   Lab Results  Component Value Date   ALT 40 (H) 08/20/2020   AST 23 08/20/2020   ALKPHOS 50 02/13/2020   BILITOT 0.4 08/20/2020   Lab Results  Component Value Date   HGBA1C 5.4 11/23/2019   HGBA1C 5.2 06/22/2019   HGBA1C 5.3 01/23/2019   HGBA1C 5.6 10/12/2018   HGBA1C 6.1 01/28/2017   Lab Results  Component Value Date   INSULIN 10.3 11/23/2019   INSULIN 6.7 06/22/2019   INSULIN 6.2 01/23/2019   INSULIN 5.9 10/12/2018   Lab Results  Component Value Date   TSH 2.340 12/01/2019   Lab Results  Component Value Date   CHOL 164 08/20/2020   HDL 39 (L) 08/20/2020   LDLCALC 101 (H) 08/20/2020   LDLDIRECT 83.0 01/28/2017   TRIG 141 08/20/2020   CHOLHDL 4.2 08/20/2020   Lab Results  Component Value Date   WBC 5.8 08/20/2020   HGB 12.8 08/20/2020   HCT 40.0 08/20/2020   MCV 82.3 08/20/2020   PLT 184 08/20/2020   Lab Results  Component Value Date   IRON 52 12/12/2007    Obesity Behavioral Intervention:   Approximately 15 minutes were spent on the discussion below.  ASK: We discussed the diagnosis of obesity with Kristin Delgado today and Kristin Delgado agreed to give [name] permission to discuss obesity behavioral modification therapy today.  ASSESS: Ravleen has the diagnosis of obesity and her BMI today is 30.9. Kristin Delgado is in the action stage of change.   ADVISE: Kristin Delgado was educated on the multiple  health risks of obesity as well as the benefit of weight loss to improve her health. She was advised of the need for long term treatment and the importance of lifestyle modifications to improve her current health and to decrease her risk of future health problems.  AGREE: Multiple dietary modification options and treatment options were discussed and Kristin Delgado agreed to follow the recommendations documented in the above note.  ARRANGE: Kristin Delgado was educated on the importance of frequent visits to treat obesity as outlined per CMS and USPSTF guidelines and agreed to schedule her next follow up appointment today.  Attestation Statements:   Reviewed by clinician on day of visit: allergies, medications, problem list, medical history, surgical history, family history, social history, and previous encounter notes.  I, Para March, am acting as transcriptionist for Coralie Common, MD.  I have reviewed the above documentation for accuracy and completeness, and I agree with the above. - Jinny Blossom, MD

## 2020-12-17 ENCOUNTER — Encounter (INDEPENDENT_AMBULATORY_CARE_PROVIDER_SITE_OTHER): Payer: Self-pay | Admitting: Family Medicine

## 2020-12-17 ENCOUNTER — Ambulatory Visit (INDEPENDENT_AMBULATORY_CARE_PROVIDER_SITE_OTHER): Payer: Medicare Other | Admitting: Family Medicine

## 2020-12-17 ENCOUNTER — Other Ambulatory Visit: Payer: Self-pay

## 2020-12-17 VITALS — BP 158/88 | HR 69 | Temp 98.0°F | Ht 63.0 in | Wt 177.0 lb

## 2020-12-17 DIAGNOSIS — E7849 Other hyperlipidemia: Secondary | ICD-10-CM | POA: Diagnosis not present

## 2020-12-17 DIAGNOSIS — E669 Obesity, unspecified: Secondary | ICD-10-CM | POA: Diagnosis not present

## 2020-12-17 DIAGNOSIS — I10 Essential (primary) hypertension: Secondary | ICD-10-CM | POA: Diagnosis not present

## 2020-12-17 DIAGNOSIS — Z6831 Body mass index (BMI) 31.0-31.9, adult: Secondary | ICD-10-CM

## 2020-12-17 DIAGNOSIS — M25559 Pain in unspecified hip: Secondary | ICD-10-CM

## 2020-12-17 MED ORDER — LISINOPRIL 10 MG PO TABS: 10.0000 mg | ORAL_TABLET | Freq: Every day | ORAL | 0 refills | Status: DC

## 2020-12-17 MED ORDER — ATORVASTATIN CALCIUM 20 MG PO TABS: 20.0000 mg | ORAL_TABLET | Freq: Every day | ORAL | 1 refills | Status: DC

## 2020-12-18 NOTE — Progress Notes (Signed)
Chief Complaint:   OBESITY Kristin Delgado is here to discuss her progress with her obesity treatment plan along with follow-up of her obesity related diagnoses. Dawana is on keeping a food journal and adhering to recommended goals of 1250 calories and 75-85 g protein and states she is following her eating plan approximately 50% of the time. Frank states she is doing 0 minutes 0 times per week.  Today's visit was #: 65 Starting weight: 189 lbs Starting date: 10/12/2018 Today's weight: 177 lbs Today's date: 12/17/2020 Total lbs lost to date: 12 lbs Total lbs lost since last in-office visit: 0  Interim History: Pt is in pain currently; mostly hip pain bilaterally. She is having a hard time moving around, especially if she tries to move her left leg or hip. Pt is journaling daily but hasn't been reaching calories and is mostly in 70's for grams of protein daily.  Subjective:   1. Hip pain, left Unclear mechanism of injury. Pt has significant restriction in left hip and knee.  2. Essential hypertension BP controlled previously but pt is in pain.  Pt denies chest pain, chest pressure and headache.  3. Other hyperlipidemia LDL previously 101, HDL 39, and triglycerides 141. Pt denies transaminitis.  Assessment/Plan:   1. Hip pain, left Dr. Tamala Julian information given for evaluation and treatment.  2. Essential hypertension Zahirah is working on healthy weight loss and exercise to improve blood pressure control. We will watch for signs of hypotension as she continues her lifestyle modifications. Continue current treatment plan.  - lisinopril (ZESTRIL) 10 MG tablet; Take 1 tablet (10 mg total) by mouth daily.  Dispense: 90 tablet; Refill: 0  3. Other hyperlipidemia Cardiovascular risk and specific lipid/LDL goals reviewed.  We discussed several lifestyle modifications today and Jayme will continue to work on diet, exercise and weight loss efforts. Orders and follow up as documented in  patient record.   Counseling Intensive lifestyle modifications are the first line treatment for this issue. . Dietary changes: Increase soluble fiber. Decrease simple carbohydrates. . Exercise changes: Moderate to vigorous-intensity aerobic activity 150 minutes per week if tolerated. . Lipid-lowering medications: see documented in medical record.  - atorvastatin (LIPITOR) 20 MG tablet; Take 1 tablet (20 mg total) by mouth daily.  Dispense: 90 tablet; Refill: 1  4. Class 1 obesity with serious comorbidity and body mass index (BMI) of 31.0 to 31.9 in adult, unspecified obesity type Khara is currently in the action stage of change. As such, her goal is to continue with weight loss efforts. She has agreed to keeping a food journal and adhering to recommended goals of 1250 calories and 75-85 g protein.   Exercise goals: No exercise has been prescribed at this time.  Behavioral modification strategies: increasing lean protein intake, meal planning and cooking strategies, keeping healthy foods in the home and planning for success.  Finlay has agreed to follow-up with our clinic in 6 weeks. She was informed of the importance of frequent follow-up visits to maximize her success with intensive lifestyle modifications for her multiple health conditions.   Objective:   Blood pressure (!) 158/88, pulse 69, temperature 98 F (36.7 C), temperature source Oral, height 5\' 3"  (1.6 m), weight 177 lb (80.3 kg), last menstrual period 06/10/2011, SpO2 97 %. Body mass index is 31.35 kg/m.  General: Cooperative, alert, well developed, in no acute distress. HEENT: Conjunctivae and lids unremarkable. Cardiovascular: Regular rhythm.  Lungs: Normal work of breathing. Neurologic: No focal deficits.   Lab Results  Component Value Date   CREATININE 0.67 08/20/2020   BUN 21 08/20/2020   NA 140 08/20/2020   K 4.3 08/20/2020   CL 103 08/20/2020   CO2 30 08/20/2020   Lab Results  Component Value Date    ALT 40 (H) 08/20/2020   AST 23 08/20/2020   ALKPHOS 50 02/13/2020   BILITOT 0.4 08/20/2020   Lab Results  Component Value Date   HGBA1C 5.4 11/23/2019   HGBA1C 5.2 06/22/2019   HGBA1C 5.3 01/23/2019   HGBA1C 5.6 10/12/2018   HGBA1C 6.1 01/28/2017   Lab Results  Component Value Date   INSULIN 10.3 11/23/2019   INSULIN 6.7 06/22/2019   INSULIN 6.2 01/23/2019   INSULIN 5.9 10/12/2018   Lab Results  Component Value Date   TSH 2.340 12/01/2019   Lab Results  Component Value Date   CHOL 164 08/20/2020   HDL 39 (L) 08/20/2020   LDLCALC 101 (H) 08/20/2020   LDLDIRECT 83.0 01/28/2017   TRIG 141 08/20/2020   CHOLHDL 4.2 08/20/2020   Lab Results  Component Value Date   WBC 5.8 08/20/2020   HGB 12.8 08/20/2020   HCT 40.0 08/20/2020   MCV 82.3 08/20/2020   PLT 184 08/20/2020   Lab Results  Component Value Date   IRON 52 12/12/2007    Obesity Behavioral Intervention:   Approximately 15 minutes were spent on the discussion below.  ASK: We discussed the diagnosis of obesity with [name] today and Shifra agreed to give [name] permission to discuss obesity behavioral modification therapy today.  ASSESS: Aaliyha has the diagnosis of obesity and her BMI today is 31.4. Kada is in the action stage of change.   ADVISE: Charnette was educated on the multiple health risks of obesity as well as the benefit of weight loss to improve her health. She was advised of the need for long term treatment and the importance of lifestyle modifications to improve her current health and to decrease her risk of future health problems.  AGREE: Multiple dietary modification options and treatment options were discussed and Xariah agreed to follow the recommendations documented in the above note.  ARRANGE: Anabelle was educated on the importance of frequent visits to treat obesity as outlined per CMS and USPSTF guidelines and agreed to schedule her next follow up appointment today.  Attestation  Statements:   Reviewed by clinician on day of visit: allergies, medications, problem list, medical history, surgical history, family history, social history, and previous encounter notes.  Coral Ceo, am acting as transcriptionist for Coralie Common, MD.   I have reviewed the above documentation for accuracy and completeness, and I agree with the above. - Jinny Blossom, MD

## 2020-12-19 ENCOUNTER — Other Ambulatory Visit (INDEPENDENT_AMBULATORY_CARE_PROVIDER_SITE_OTHER): Payer: Self-pay | Admitting: Family Medicine

## 2020-12-19 ENCOUNTER — Encounter (INDEPENDENT_AMBULATORY_CARE_PROVIDER_SITE_OTHER): Payer: Self-pay

## 2020-12-19 DIAGNOSIS — E8881 Metabolic syndrome: Secondary | ICD-10-CM

## 2020-12-19 DIAGNOSIS — I10 Essential (primary) hypertension: Secondary | ICD-10-CM

## 2020-12-19 DIAGNOSIS — F3289 Other specified depressive episodes: Secondary | ICD-10-CM

## 2020-12-19 NOTE — Telephone Encounter (Signed)
Message sent to pt-[facility] 

## 2020-12-30 DIAGNOSIS — S83412A Sprain of medial collateral ligament of left knee, initial encounter: Secondary | ICD-10-CM | POA: Diagnosis not present

## 2020-12-30 DIAGNOSIS — M9902 Segmental and somatic dysfunction of thoracic region: Secondary | ICD-10-CM | POA: Diagnosis not present

## 2020-12-30 DIAGNOSIS — M4726 Other spondylosis with radiculopathy, lumbar region: Secondary | ICD-10-CM | POA: Diagnosis not present

## 2020-12-30 DIAGNOSIS — M9903 Segmental and somatic dysfunction of lumbar region: Secondary | ICD-10-CM | POA: Diagnosis not present

## 2020-12-30 DIAGNOSIS — M4724 Other spondylosis with radiculopathy, thoracic region: Secondary | ICD-10-CM | POA: Diagnosis not present

## 2020-12-30 DIAGNOSIS — M9906 Segmental and somatic dysfunction of lower extremity: Secondary | ICD-10-CM | POA: Diagnosis not present

## 2020-12-31 DIAGNOSIS — M9906 Segmental and somatic dysfunction of lower extremity: Secondary | ICD-10-CM | POA: Diagnosis not present

## 2020-12-31 DIAGNOSIS — M4724 Other spondylosis with radiculopathy, thoracic region: Secondary | ICD-10-CM | POA: Diagnosis not present

## 2020-12-31 DIAGNOSIS — M9902 Segmental and somatic dysfunction of thoracic region: Secondary | ICD-10-CM | POA: Diagnosis not present

## 2020-12-31 DIAGNOSIS — S83412A Sprain of medial collateral ligament of left knee, initial encounter: Secondary | ICD-10-CM | POA: Diagnosis not present

## 2020-12-31 DIAGNOSIS — M9903 Segmental and somatic dysfunction of lumbar region: Secondary | ICD-10-CM | POA: Diagnosis not present

## 2020-12-31 DIAGNOSIS — M4726 Other spondylosis with radiculopathy, lumbar region: Secondary | ICD-10-CM | POA: Diagnosis not present

## 2021-01-01 ENCOUNTER — Ambulatory Visit: Payer: Medicare Other | Admitting: Dermatology

## 2021-01-03 ENCOUNTER — Other Ambulatory Visit: Payer: Self-pay

## 2021-01-03 ENCOUNTER — Ambulatory Visit
Admission: EM | Admit: 2021-01-03 | Discharge: 2021-01-03 | Disposition: A | Discharge status: Home / Self Care | Payer: Medicare Other | Attending: Emergency Medicine | Admitting: Emergency Medicine

## 2021-01-03 DIAGNOSIS — M5442 Lumbago with sciatica, left side: Secondary | ICD-10-CM | POA: Diagnosis not present

## 2021-01-03 MED ORDER — PREDNISONE 10 MG PO TABS: ORAL_TABLET | ORAL | 0 refills | Status: DC

## 2021-01-03 MED ORDER — KETOROLAC TROMETHAMINE 30 MG/ML IJ SOLN
30.0000 mg | Freq: Once | INTRAMUSCULAR | Status: AC
  Administered 2021-01-03: 30 mg via INTRAMUSCULAR

## 2021-01-03 MED ORDER — NAPROXEN 500 MG PO TABS: 500.0000 mg | ORAL_TABLET | Freq: Two times a day (BID) | ORAL | 0 refills | Status: DC

## 2021-01-03 MED ORDER — TIZANIDINE HCL 4 MG PO TABS: 2.0000 mg | ORAL_TABLET | Freq: Four times a day (QID) | ORAL | 0 refills | Status: AC | PRN

## 2021-01-03 NOTE — ED Triage Notes (Signed)
Pt c/o lt hip pain radiating to lt knee for over  Month. States pain worsen over the past week. Denies injury.

## 2021-01-03 NOTE — Discharge Instructions (Addendum)
Begin prednisone taper over the next 6 days-begin with 6 tablets, decrease by 1 tablet each day until complete-6, 5, 4, 3, 2, 1-take with food and earlier in the day if possible Use Naprosyn twice daily as needed for pain after completion of prednisone course Supplement with tizanidine which is a muscle relaxer, may cause drowsiness, limit use to at home/bedtime  Gentle stretching, alternate ice and heat  Please follow-up with primary care sports medicine orthopedics for MRI

## 2021-01-03 NOTE — ED Provider Notes (Signed)
[facility]    CSN: [id number] Arrival date & time: 01/03/21  1034      History   Chief Complaint Chief Complaint  Patient presents with  . Hip Pain    HPI Kristin Delgado is a 65 y.o. female history of hypertension presenting today for evaluation of sciatic pain.  Reports left hip pain radiating into left knee for approximately 1 month.  Pain worsened in the past week.  Denies any new injury or fall.  Feels pain stemming from her left hip.  Went to chiropractor earlier this week and had imaging performed, unable to tolerate chiropractor due to pain.  Denies any changes in urination or bowel movements.  Using Tylenol without relief.  Denies history of diabetes.  HPI  Past Medical History:  Diagnosis Date  . Allergy    seasonal  . Anxiety    closed space like MRI  . Back pain \  . Chest pain 2006   admitted, essentially negative  . Constipation   . Depression   . GERD (gastroesophageal reflux disease)   . Hyperlipidemia   . Hypertension   . Infertility, female   . Joint pain   . Pituitary microadenoma ([place])   . Swallowing difficulty   . 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 Vitamin D deficiency     Patient Active Problem List   Diagnosis Date Noted  . Suspicious nevus 08/20/2020  . At risk for heart disease 11/01/2019  . Vitamin D deficiency 11/01/2019  . Insulin resistance 09/11/2019  . Class 1 obesity with serious comorbidity and body mass index (BMI) of 30.0 to 30.9 in adult 09/11/2019  . Pituitary adenoma ([place]) 01/30/2019  . Arthritis of both hands 08/01/2018  . Hyperlipidemia 07/15/2013  . Sensorineural hearing loss, unilateral 03/07/2013  . Cardiac murmur 06/14/2012  . Depression with anxiety 02/12/2011  . Essential hypertension 03/08/2007    Past Surgical History:  Procedure Laterality Date  . 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 BRAIN SURGERY  1 / 1985   microadenoma  . COCHLEAR IMPLANT  02/2013   Left side    OB History    Gravida  3    Para  3   Term      Preterm      AB      Living        SAB      IAB      Ectopic      Multiple      Live Births               Home Medications    Prior to Admission medications   Medication Sig Start Date End Date Taking? Authorizing Provider  naproxen (NAPROSYN) 500 MG tablet Take 1 tablet (500 mg total) by mouth 2 (two) times daily. Use after course of prednisone 01/03/21  Yes Jinelle Butchko C, PA-C  predniSONE (DELTASONE) 10 MG tablet Begin with 6 tabs on day 1, 5 tab on day 2, 4 tab on day 3, 3 tab on day 4, 2 tab on day 5, 1 tab on day 6-take with food 01/03/21  Yes Carisma Troupe C, PA-C  tiZANidine (ZANAFLEX) 4 MG tablet Take 0.5-1 tablets (2-4 mg total) by mouth every 6 (six) hours as needed for muscle spasms. 01/03/21  Yes Pranay Hilbun C, PA-C  acetaminophen (TYLENOL) 650 MG CR tablet Take 650 mg by mouth every 8 (eight) hours as needed. pain    [provider]  Ascorbic  Acid (VITAMIN C) 1000 MG tablet Take 1,000 mg by mouth 2 (two) times daily.    [provider]  atorvastatin (LIPITOR) 20 MG tablet Take 1 tablet (20 mg total) by mouth daily. 12/17/20   Laqueta Linden, MD  b complex vitamins tablet Take 1 tablet by mouth daily.    [provider]  bromocriptine (PARLODEL) 2.5 MG tablet Take 1 tablet (2.5 mg total) by mouth daily. 02/13/20   [name], DO  Calcium Acetate, Phos Binder, (CALCIUM ACETATE PO) Take by mouth.    [provider]  Calcium Polycarbophil (FIBER-CAPS PO) Take by mouth.    [provider]  cholecalciferol (VITAMIN D3) 25 MCG (1000 UT) tablet Take 5,000 Units by mouth daily.    [provider]  ferrous sulfate 325 (65 FE) MG EC tablet Take 325 mg by mouth 3 (three) times daily with meals.    [provider]  hydrochlorothiazide (HYDRODIURIL) 12.5 MG tablet TAKE 1 TABLET BY MOUTH EVERY DAY 12/19/20   Laqueta Linden, MD  lisinopril (ZESTRIL) 10 MG tablet Take  1 tablet (10 mg total) by mouth daily. 12/17/20   Laqueta Linden, MD  metFORMIN (GLUCOPHAGE) 500 MG tablet TAKE 1 TABLET BY MOUTH EVERY DAY WITH BREAKFAST 12/19/20   Laqueta Linden, MD  Multiple Vitamin (MULTIVITAMIN) tablet Take 1 tablet by mouth daily.    [provider]  Omega 3-6-9 Fatty Acids (OMEGA 3-6-9 COMPLEX PO) Take by mouth.    [provider]  Omega-3 Fatty Acids (FISH OIL) 1200 MG CAPS Take 1 capsule by mouth 2 (two) times daily.    [provider]  Probiotic Product (PROBIOTIC ADVANCED PO) Take by mouth.    [provider]  sertraline (ZOLOFT) 25 MG tablet Take 1 tablet (25 mg total) by mouth daily. 11/19/20   Laqueta Linden, MD    Family History Family History  Problem Relation Age of Onset  . Lung cancer Mother   . Cancer Mother 40       lung   . Alzheimer's disease Father   . Dementia Father     Social History Social History   Tobacco Use  . Smoking status: Never Smoker  . Smokeless tobacco: Never Used  Vaping Use  . Vaping Use: Never used  Substance Use Topics  . Alcohol use: No    Comment: rare  . Drug use: No     Allergies   Patient has no known allergies.   Review of Systems Review of Systems  Constitutional: Negative for fatigue and fever.  Eyes: Negative for visual disturbance.  Respiratory: Negative for shortness of breath.   Cardiovascular: Negative for chest pain.  Gastrointestinal: Negative for abdominal pain, nausea and vomiting.  Musculoskeletal: Positive for arthralgias and back pain. Negative for joint swelling.  Skin: Negative for color change, rash and wound.  Neurological: Negative for dizziness, weakness, light-headedness and headaches.     Physical Exam Triage Vital Signs ED Triage Vitals [01/03/21 1126]  Enc Vitals Group     BP (!) 156/85     Pulse Rate 86     Resp 18     Temp 98.1 F (36.7 C)     Temp Source Oral     SpO2 98 %     Weight      Height      Head  Circumference      Peak Flow      Pain Score 5  Pain Loc      Pain Edu?      Excl. in [place]?    No data found.  Updated Vital Signs BP (!) 156/85 (BP Location: Left Arm)   Pulse 86   Temp 98.1 F (36.7 C) (Oral)   Resp 18   LMP 06/10/2011   SpO2 98%   Visual Acuity Right Eye Distance:   Left Eye Distance:   Bilateral Distance:    Right Eye Near:   Left Eye Near:    Bilateral Near:     Physical Exam Vitals and nursing note reviewed.  Constitutional:      Appearance: She is well-developed and well-nourished.     Comments: No acute distress  HENT:     Head: Normocephalic and atraumatic.     Nose: Nose normal.  Eyes:     Conjunctiva/sclera: Conjunctivae normal.  Cardiovascular:     Rate and Rhythm: Normal rate.  Pulmonary:     Effort: Pulmonary effort is normal. No respiratory distress.  Abdominal:     General: There is no distension.  Musculoskeletal:        General: Normal range of motion.     Cervical back: Neck supple.     Comments: Diffuse tenderness throughout lumbar spine midline, increased tenderness throughout left lumbar area extending into gluteal area  Resisted hip flexion on left 4/5 compared to right, eliciting pain, otherwise hip strength intact  Right knee: No obvious swelling or deformity, nontender to palpation to medial lateral joint line and over patella, full active range of motion with flexion and extension  Skin:    General: Skin is warm and dry.  Neurological:     Mental Status: She is alert and oriented to person, place, and time.  Psychiatric:        Mood and Affect: Mood and affect normal.      UC Treatments / Results  Labs (all labs ordered are listed, but only abnormal results are displayed) Labs Reviewed - No data to display  EKG   Radiology No results found.  Procedures Procedures (including critical care time)  Medications Ordered in UC Medications  ketorolac (TORADOL) 30 MG/ML injection 30 mg (30 mg  Intramuscular Given 01/03/21 1218)    Initial Impression / Assessment and Plan / UC Course  I have reviewed the triage vital signs and the nursing notes.  Pertinent labs & imaging results that were available during my care of the patient were reviewed by me and considered in my medical decision making (see chart for details).     Left-sided lower back pain with radicular distribution into left side, no neuro deficits, negative red flags for cauda equina, treating with prednisone taper and muscle relaxers, Toradol prior to discharge, NSAIDs after completion of prednisone.  Follow-up with PCP/sports medicine orthopedics for further evaluation and imaging.  Advised patient I cannot order MRI at urgent care.  Discussed strict return precautions. Patient verbalized understanding and is agreeable with plan.  Final Clinical Impressions(s) / UC Diagnoses   Final diagnoses:  Acute left-sided low back pain with left-sided sciatica     Discharge Instructions     Begin prednisone taper over the next 6 days-begin with 6 tablets, decrease by 1 tablet each day until complete-6, 5, 4, 3, 2, 1-take with food and earlier in the day if possible Use Naprosyn twice daily as needed for pain after completion of prednisone course Supplement with tizanidine which is a muscle relaxer, may cause drowsiness, limit use to at home/bedtime  Gentle stretching, alternate ice and heat  Please follow-up with primary care sports medicine orthopedics for MRI    ED Prescriptions    Medication Sig Dispense Auth. Provider   predniSONE (DELTASONE) 10 MG tablet Begin with 6 tabs on day 1, 5 tab on day 2, 4 tab on day 3, 3 tab on day 4, 2 tab on day 5, 1 tab on day 6-take with food 21 tablet Tareka Jhaveri C, PA-C   tiZANidine (ZANAFLEX) 4 MG tablet Take 0.5-1 tablets (2-4 mg total) by mouth every 6 (six) hours as needed for muscle spasms. 30 tablet Luanna Weesner C, PA-C   naproxen (NAPROSYN) 500 MG tablet Take 1 tablet  (500 mg total) by mouth 2 (two) times daily. Use after course of prednisone 30 tablet Jordin Vicencio, [facility] C, PA-C     PDMP not reviewed this encounter.   Janith Lima, PA-C 01/03/21 1457

## 2021-01-06 NOTE — Progress Notes (Signed)
[place] Phone: [phone] Subjective:   Fontaine No, am serving as a scribe for Dr. Hulan Saas. This visit occurred during the SARS-CoV-2 public health emergency.  Safety protocols were in place, including screening questions prior to the visit, additional usage of staff PPE, and extensive cleaning of exam room while observing appropriate contact time as indicated for disinfecting solutions.   I'm seeing this patient by the request  of:  [name], DO  CC: Low back pain  [id number]  Cheyan Frees is a 65 y.o. female coming in with complaint of left-sided lower back pain that radiates down to left knee, 1 month. Patient states that her pain has been worsening since January. Pain begins in left glute and sharp pain radiates down to superior aspect of knee. Numbness on medial aspect of knee. Uses Tylenol, heat, ice. Patient was on short course of prednisone. Pain increased as she decreased the dose.   Was suppose to have an MRI in 2020 but COVID began. Did fall and had lumbar compression fracture.     Xray lumbar 01/2019 IMPRESSION: Subtle superior endplate compression fracture of L2 vertebral body though vertebral body height is well maintained.  Osseous demineralization with scattered degenerative disc and facet disease changes of the thoracolumbar spine.  Sacrum xray 2020 IMPRESSION: No acute osseous abnormalities.  Degenerative disc disease changes and levoconvex scoliosis of the lower lumbar spine.    Past Medical History:  Diagnosis Date  . Allergy    seasonal  . Anxiety    closed space like MRI  . Back pain \  . Chest pain 2006   admitted, essentially negative  . Constipation   . Depression   . GERD (gastroesophageal reflux disease)   . Hyperlipidemia   . Hypertension   . Infertility, female   . Joint pain   . Pituitary microadenoma ([place])   . Swallowing difficulty    . 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 Vitamin D deficiency    Past Surgical History:  Procedure Laterality Date  . 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 BRAIN SURGERY  1 / 1985   microadenoma  . COCHLEAR IMPLANT  02/2013   Left side   Social History   Socioeconomic History  . Marital status: Married    Spouse name: Annie Main  . Number of children: 3  . Years of education: Not on file  . Highest education level: Not on file  Occupational History  . 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 Smoking status: Never Smoker  . Smokeless tobacco: Never Used  Vaping Use  . Vaping Use: Never used  Substance and Sexual Activity  . Alcohol use: No    Comment: rare  . Drug use: No  . Sexual activity: Not Currently    Partners: Male  Other Topics Concern  . Not on file  Social History Narrative   Exercise-- daily --- walking, 3x a week DVD   Social Determinants of Health   Financial Resource Strain: Low Risk   . Difficulty of Paying Living Expenses: Not hard at all  Food Insecurity: No Food Insecurity  . Worried About Charity fundraiser in the Last Year: Never true  . Ran Out of Food in the Last Year: Never true  Transportation Needs: No Transportation Needs  . Lack of Transportation (Medical): No  . Lack of Transportation (Non-Medical): No  Physical Activity: Sufficiently Active  . Days  of Exercise per Week: 7 days  . Minutes of Exercise per Session: 30 min  Stress: No Stress Concern Present  . Feeling of Stress : Only a little  Social Connections: Moderately Isolated  . Frequency of Communication with Friends and Family: More than three times a week  . Frequency of Social Gatherings with Friends and Family: Never  . Attends Religious Services: Never  . Active Member of Clubs or Organizations: No  . Attends Archivist Meetings: Never  . Marital Status: Married   No Known Allergies Family History  Problem Relation Age of Onset  . Lung cancer Mother   . Cancer  Mother 20       lung   . Alzheimer's disease Father   . Dementia Father     Current Outpatient Medications (Endocrine & Metabolic):  .  metFORMIN (GLUCOPHAGE) 500 MG tablet, TAKE 1 TABLET BY MOUTH EVERY DAY WITH BREAKFAST .  predniSONE (DELTASONE) 10 MG tablet, Begin with 6 tabs on day 1, 5 tab on day 2, 4 tab on day 3, 3 tab on day 4, 2 tab on day 5, 1 tab on day 6-take with food  Current Outpatient Medications (Cardiovascular):  .  atorvastatin (LIPITOR) 20 MG tablet, Take 1 tablet (20 mg total) by mouth daily. .  hydrochlorothiazide (HYDRODIURIL) 12.5 MG tablet, TAKE 1 TABLET BY MOUTH EVERY DAY .  lisinopril (ZESTRIL) 10 MG tablet, Take 1 tablet (10 mg total) by mouth daily.   Current Outpatient Medications (Analgesics):  .  acetaminophen (TYLENOL) 650 MG CR tablet, Take 650 mg by mouth every 8 (eight) hours as needed. pain .  meloxicam (MOBIC) 7.5 MG tablet, Take 1 tablet (7.5 mg total) by mouth daily.  Current Outpatient Medications (Hematological):  .  ferrous sulfate 325 (65 FE) MG EC tablet, Take 325 mg by mouth 3 (three) times daily with meals.  Current Outpatient Medications (Other):  [name]  Ascorbic Acid (VITAMIN C) 1000 MG tablet, Take 1,000 mg by mouth 2 (two) times daily. [name]  b complex vitamins tablet, Take 1 tablet by mouth daily. .  bromocriptine (PARLODEL) 2.5 MG tablet, Take 1 tablet (2.5 mg total) by mouth daily. .  Calcium Acetate, Phos Binder, (CALCIUM ACETATE PO)*, Take by mouth. .  Calcium Polycarbophil (FIBER-CAPS PO), Take by mouth. .  cholecalciferol (VITAMIN D3) 25 MCG (1000 UT) tablet, Take 5,000 Units by mouth daily. [name]  gabapentin (NEURONTIN) 100 MG capsule, Take 1 capsule (100 mg total) by mouth at bedtime. .  Multiple Vitamin (MULTIVITAMIN) tablet, Take 1 tablet by mouth daily. .  Omega 3-6-9 Fatty Acids (OMEGA 3-6-9 COMPLEX PO), Take by mouth. .  Omega-3 Fatty Acids (FISH OIL) 1200 MG CAPS, Take 1 capsule by mouth 2 (two) times daily. .  Probiotic Product  (PROBIOTIC ADVANCED PO), Take by mouth. .  sertraline (ZOLOFT) 25 MG tablet, Take 1 tablet (25 mg total) by mouth daily. [name]  tiZANidine (ZANAFLEX) 4 MG tablet, Take 0.5-1 tablets (2-4 mg total) by mouth every 6 (six) hours as needed for muscle spasms. * These medications belong to multiple therapeutic classes and are listed under each applicable group.   Reviewed prior external information including notes and imaging from  primary care provider As well as notes that were available from care everywhere and other healthcare systems.  Past medical history, social, surgical and family history all reviewed in electronic medical record.  No pertanent information unless stated regarding to the chief complaint.   Review of Systems:  No  headache, visual changes, nausea, vomiting, diarrhea, constipation, dizziness, abdominal pain, skin rash, fevers, chills, night sweats, weight loss, swollen lymph nodes,joint swelling, chest pain, shortness of breath, mood changes. POSITIVE muscle aches, body aches  Objective  Blood pressure 122/78, pulse 80, height 5\' 3"  (1.6 m), weight 179 lb (81.2 kg), last menstrual period 06/10/2011, SpO2 99 %.   General: No apparent distress alert and oriented x3 mood and affect normal, dressed appropriately.  HEENT: Pupils equal, extraocular movements intact  Respiratory: Patient's speak in full sentences and does not appear short of breath  Cardiovascular: No lower extremity edema, non tender, no erythema  Gait severely antalgic Patient's back exam shows the patient has significant loss of lordosis.  Tender to palpation in the paraspinal musculature of the lumbar spine.  Patient does have some scoliosis noted that seems to be degenerative in nature.  Positive straight leg test noted on the left side at 20 degrees of forward flexion.  Patient actually has weakness noted with dorsi flexion and only 1+ DTR of the Achilles compared to contralateral side.  Patient does have pain with  resisted hip flexion but no significant weakness.  The dorsalis pedis pulses noted and symmetric to contralateral side   Impression and Recommendations:     The above documentation has been reviewed and is accurate and complete Lyndal Pulley, DO

## 2021-01-09 ENCOUNTER — Other Ambulatory Visit: Payer: Self-pay

## 2021-01-09 ENCOUNTER — Ambulatory Visit (INDEPENDENT_AMBULATORY_CARE_PROVIDER_SITE_OTHER): Payer: Medicare Other

## 2021-01-09 ENCOUNTER — Ambulatory Visit (INDEPENDENT_AMBULATORY_CARE_PROVIDER_SITE_OTHER): Payer: Medicare Other | Admitting: Family Medicine

## 2021-01-09 ENCOUNTER — Encounter: Payer: Self-pay | Admitting: Family Medicine

## 2021-01-09 VITALS — BP 122/78 | HR 80 | Ht 63.0 in | Wt 179.0 lb

## 2021-01-09 DIAGNOSIS — M5416 Radiculopathy, lumbar region: Secondary | ICD-10-CM

## 2021-01-09 DIAGNOSIS — M545 Low back pain, unspecified: Secondary | ICD-10-CM

## 2021-01-09 MED ORDER — MELOXICAM 7.5 MG PO TABS: 7.5000 mg | ORAL_TABLET | Freq: Every day | ORAL | 0 refills | Status: DC

## 2021-01-09 MED ORDER — GABAPENTIN 100 MG PO CAPS: 100.0000 mg | ORAL_CAPSULE | Freq: Every day | ORAL | 0 refills | Status: DC

## 2021-01-09 NOTE — Assessment & Plan Note (Signed)
Patient is having a lumbar radiculopathy.  Patient does have unfortunately radicular symptoms going down into the more of the L3-S1 nerve root distributions.  Patient does have weakness of dorsiflexion of the left leg.  Achilles deep tendon reflexes is also 1+ compared to the contralateral side.  Patient did respond to the prednisone initially but then unfortunately having worsening pain again.  Patient given a very low dose of a meloxicam and discontinue the naproxen.  Discussed gabapentin at a very low dose significantly beneficial to help with nerve pain.  I do believe though secondary to the weakness and constant numbness that we do need any further imaging with an MRI.  Patient could be a potential candidate for injections if necessary.  Patient has had a history of potential compression fracture as well and this could be contributing as well.  Will rule out any occult fracture.  Worsening pain patient is to go to the emergency room.  Follow-up with me again after imaging to discuss potential treatment options.

## 2021-01-09 NOTE — Patient Instructions (Addendum)
Meloxicam 7.5mg  Do not use NSAIDS such as Advil or Aleve when taking Meloxicam It is ok to use Tylenol for additional pain relief  Gabapentin 100mg  at night for the nerve  Xray today  MRI lumbar spine [phone]  If symptoms worsen, please seek medical care at the emergency room  Lets get image and we will talk through MyChart  Please see front desk to schedule bone density

## 2021-01-17 ENCOUNTER — Telehealth: Payer: Self-pay

## 2021-01-23 NOTE — Telephone Encounter (Signed)
Entered in error

## 2021-01-25 ENCOUNTER — Ambulatory Visit
Admission: RE | Admit: 2021-01-25 | Discharge: 2021-01-25 | Disposition: A | Discharge status: Home / Self Care | Payer: Medicare Other | Source: Ambulatory Visit | Attending: Family Medicine | Admitting: Family Medicine

## 2021-01-25 ENCOUNTER — Other Ambulatory Visit: Payer: Self-pay

## 2021-01-25 DIAGNOSIS — M545 Low back pain, unspecified: Secondary | ICD-10-CM

## 2021-01-27 ENCOUNTER — Encounter (INDEPENDENT_AMBULATORY_CARE_PROVIDER_SITE_OTHER): Payer: Self-pay | Admitting: Family Medicine

## 2021-01-27 ENCOUNTER — Other Ambulatory Visit: Payer: Self-pay

## 2021-01-27 ENCOUNTER — Ambulatory Visit (INDEPENDENT_AMBULATORY_CARE_PROVIDER_SITE_OTHER): Payer: Medicare Other | Admitting: Family Medicine

## 2021-01-27 VITALS — BP 144/81 | HR 72 | Temp 97.7°F | Ht 63.0 in | Wt 176.0 lb

## 2021-01-27 DIAGNOSIS — F32A Depression, unspecified: Secondary | ICD-10-CM | POA: Diagnosis not present

## 2021-01-27 DIAGNOSIS — E669 Obesity, unspecified: Secondary | ICD-10-CM | POA: Diagnosis not present

## 2021-01-27 DIAGNOSIS — I10 Essential (primary) hypertension: Secondary | ICD-10-CM

## 2021-01-27 DIAGNOSIS — F419 Anxiety disorder, unspecified: Secondary | ICD-10-CM

## 2021-01-27 DIAGNOSIS — Z6831 Body mass index (BMI) 31.0-31.9, adult: Secondary | ICD-10-CM | POA: Diagnosis not present

## 2021-01-27 MED ORDER — LISINOPRIL 10 MG PO TABS: 10.0000 mg | ORAL_TABLET | Freq: Every day | ORAL | 0 refills | Status: DC

## 2021-01-27 MED ORDER — SERTRALINE HCL 25 MG PO TABS: 25.0000 mg | ORAL_TABLET | Freq: Every day | ORAL | 0 refills | Status: DC

## 2021-01-27 MED ORDER — HYDROCHLOROTHIAZIDE 12.5 MG PO TABS
12.5000 mg | ORAL_TABLET | Freq: Every day | ORAL | 0 refills | Status: DC
Start: 2021-01-27 — End: 2021-03-17

## 2021-01-29 NOTE — Progress Notes (Signed)
Chief Complaint:   OBESITY Kristin Delgado is here to discuss her progress with her obesity treatment plan along with follow-up of her obesity related diagnoses. Kristin Delgado is on keeping a food journal and adhering to recommended goals of 1250 calories and 85 g protein and states she is following her eating plan approximately 50% of the time. Kristin Delgado states she is doing some walking.  Today's visit was #: 52 Starting weight: 189 lbs Starting date: 10/12/2018 Today's weight: 176 lbs Today's date: 01/27/2021 Total lbs lost to date: 13 lbs Total lbs lost since last in-office visit: 1 lb  Interim History: Pt is having significant pain in her back, knee, and leg. She saw Dr. Tamala Julian and he believes it is related to her compression fracture. She is still waiting for her MRI, which is on hold until she gets documentation about cochlear implant. Food wise, pt is very very limited on ability to stand and cook. When pain increased, she was not even getting in 800-900 calories. Last week she did get up to 1,000 with 70 g protein.   Subjective:   1. Essential hypertension Pt's BP is slightly elevated today, but she has significant pain in hip.  She denies chest pain, chest pressure and headache.  BP Readings from Last 3 Encounters:  01/27/21 (!) 144/81  01/09/21 122/78  01/03/21 (!) 156/85    2. Anxiety and depression Pt denies suicidal or homicidal ideations. She reports some improvement in symptoms. She has recent increase in symptoms with significant hip pain and decreased mobility ability. Pt is having decreased appetite with recent pain and feeling very limited.  Assessment/Plan:   1. Essential hypertension George is working on healthy weight loss and exercise to improve blood pressure control. We will watch for signs of hypotension as she continues her lifestyle modifications.  - hydrochlorothiazide (HYDRODIURIL) 12.5 MG tablet; Take 1 tablet (12.5 mg total) by mouth daily.  Dispense: 90  tablet; Refill: 0 - lisinopril (ZESTRIL) 10 MG tablet; Take 1 tablet (10 mg total) by mouth daily.  Dispense: 90 tablet; Refill: 0  2. Anxiety and depression Behavior modification techniques were discussed today to help Kristin Delgado deal with her anxiety.  Orders and follow up as documented in patient record.   - sertraline (ZOLOFT) 25 MG tablet; Take 1 tablet (25 mg total) by mouth daily.  Dispense: 90 tablet; Refill: 0  3. Class 1 obesity with serious comorbidity and body mass index (BMI) of 31.0 to 31.9 in adult, unspecified obesity type Kristin Delgado is currently in the action stage of change. As such, her goal is to continue with weight loss efforts. She has agreed to keeping a food journal and adhering to recommended goals of 1250 calories and 80+ g protein.   Exercise goals: As is  Behavioral modification strategies: increasing lean protein intake, meal planning and cooking strategies, keeping healthy foods in the home and keeping a strict food journal.  Kristin Delgado has agreed to follow-up with our clinic in 6-8 weeks. She was informed of the importance of frequent follow-up visits to maximize her success with intensive lifestyle modifications for her multiple health conditions.   Objective:   Blood pressure (!) 144/81, pulse 72, temperature 97.7 F (36.5 C), temperature source Oral, height 5\' 3"  (1.6 m), weight 176 lb (79.8 kg), last menstrual period 06/10/2011, SpO2 99 %. Body mass index is 31.18 kg/m.  General: Cooperative, alert, well developed, in no acute distress. HEENT: Conjunctivae and lids unremarkable. Cardiovascular: Regular rhythm.  Lungs: Normal work of  breathing. Neurologic: No focal deficits.   Lab Results  Component Value Date   CREATININE 0.67 08/20/2020   BUN 21 08/20/2020   NA 140 08/20/2020   K 4.3 08/20/2020   CL 103 08/20/2020   CO2 30 08/20/2020   Lab Results  Component Value Date   ALT 40 (H) 08/20/2020   AST 23 08/20/2020   ALKPHOS 50 02/13/2020   BILITOT  0.4 08/20/2020   Lab Results  Component Value Date   HGBA1C 5.4 11/23/2019   HGBA1C 5.2 06/22/2019   HGBA1C 5.3 01/23/2019   HGBA1C 5.6 10/12/2018   HGBA1C 6.1 01/28/2017   Lab Results  Component Value Date   INSULIN 10.3 11/23/2019   INSULIN 6.7 06/22/2019   INSULIN 6.2 01/23/2019   INSULIN 5.9 10/12/2018   Lab Results  Component Value Date   TSH 2.340 12/01/2019   Lab Results  Component Value Date   CHOL 164 08/20/2020   HDL 39 (L) 08/20/2020   LDLCALC 101 (H) 08/20/2020   LDLDIRECT 83.0 01/28/2017   TRIG 141 08/20/2020   CHOLHDL 4.2 08/20/2020   Lab Results  Component Value Date   WBC 5.8 08/20/2020   HGB 12.8 08/20/2020   HCT 40.0 08/20/2020   MCV 82.3 08/20/2020   PLT 184 08/20/2020   Lab Results  Component Value Date   IRON 52 12/12/2007    Obesity Behavioral Intervention:   Approximately 15 minutes were spent on the discussion below.  ASK: We discussed the diagnosis of obesity with [name] today and Rosi agreed to give [name] permission to discuss obesity behavioral modification therapy today.  ASSESS: Lynora has the diagnosis of obesity and her BMI today is 31.3. Wilmetta is in the action stage of change.   ADVISE: Lutricia was educated on the multiple health risks of obesity as well as the benefit of weight loss to improve her health. She was advised of the need for long term treatment and the importance of lifestyle modifications to improve her current health and to decrease her risk of future health problems.  AGREE: Multiple dietary modification options and treatment options were discussed and Candiace agreed to follow the recommendations documented in the above note.  ARRANGE: Garnett was educated on the importance of frequent visits to treat obesity as outlined per CMS and USPSTF guidelines and agreed to schedule her next follow up appointment today.  Attestation Statements:   Reviewed by clinician on day of visit: allergies, medications,  problem list, medical history, surgical history, family history, social history, and previous encounter notes.  Coral Ceo, am acting as transcriptionist for Coralie Common, MD.   I have reviewed the above documentation for accuracy and completeness, and I agree with the above. - Jinny Blossom, MD

## 2021-01-31 ENCOUNTER — Other Ambulatory Visit: Payer: Self-pay | Admitting: Family Medicine

## 2021-02-09 ENCOUNTER — Other Ambulatory Visit (INDEPENDENT_AMBULATORY_CARE_PROVIDER_SITE_OTHER): Payer: Self-pay | Admitting: Family Medicine

## 2021-02-09 DIAGNOSIS — E8881 Metabolic syndrome: Secondary | ICD-10-CM

## 2021-02-10 ENCOUNTER — Encounter (INDEPENDENT_AMBULATORY_CARE_PROVIDER_SITE_OTHER): Payer: Self-pay

## 2021-02-10 NOTE — Telephone Encounter (Signed)
Message sent to pt-[facility] 

## 2021-02-13 ENCOUNTER — Ambulatory Visit
Admission: RE | Admit: 2021-02-13 | Discharge: 2021-02-13 | Disposition: A | Discharge status: Home / Self Care | Payer: Medicare Other | Source: Ambulatory Visit | Attending: Family Medicine | Admitting: Family Medicine

## 2021-02-13 ENCOUNTER — Other Ambulatory Visit (INDEPENDENT_AMBULATORY_CARE_PROVIDER_SITE_OTHER): Payer: Self-pay | Admitting: Family Medicine

## 2021-02-13 ENCOUNTER — Other Ambulatory Visit: Payer: Self-pay

## 2021-02-13 DIAGNOSIS — M48061 Spinal stenosis, lumbar region without neurogenic claudication: Secondary | ICD-10-CM | POA: Diagnosis not present

## 2021-02-13 DIAGNOSIS — M545 Low back pain, unspecified: Secondary | ICD-10-CM | POA: Diagnosis not present

## 2021-02-13 DIAGNOSIS — E8881 Metabolic syndrome: Secondary | ICD-10-CM

## 2021-02-17 NOTE — Telephone Encounter (Signed)
Dr.Ukleja 

## 2021-02-18 ENCOUNTER — Other Ambulatory Visit: Payer: Self-pay

## 2021-02-18 ENCOUNTER — Ambulatory Visit (INDEPENDENT_AMBULATORY_CARE_PROVIDER_SITE_OTHER): Payer: Medicare Other | Admitting: Family Medicine

## 2021-02-18 ENCOUNTER — Encounter: Payer: Self-pay | Admitting: Family Medicine

## 2021-02-18 VITALS — BP 128/88 | HR 60 | Temp 98.3°F | Resp 18 | Ht 63.0 in | Wt 184.4 lb

## 2021-02-18 DIAGNOSIS — D352 Benign neoplasm of pituitary gland: Secondary | ICD-10-CM | POA: Diagnosis not present

## 2021-02-18 DIAGNOSIS — M48061 Spinal stenosis, lumbar region without neurogenic claudication: Secondary | ICD-10-CM

## 2021-02-18 DIAGNOSIS — E785 Hyperlipidemia, unspecified: Secondary | ICD-10-CM | POA: Diagnosis not present

## 2021-02-18 DIAGNOSIS — M5136 Other intervertebral disc degeneration, lumbar region: Secondary | ICD-10-CM

## 2021-02-18 DIAGNOSIS — M5126 Other intervertebral disc displacement, lumbar region: Secondary | ICD-10-CM

## 2021-02-18 DIAGNOSIS — I10 Essential (primary) hypertension: Secondary | ICD-10-CM

## 2021-02-18 DIAGNOSIS — F320 Major depressive disorder, single episode, mild: Secondary | ICD-10-CM | POA: Diagnosis not present

## 2021-02-18 DIAGNOSIS — F418 Other specified anxiety disorders: Secondary | ICD-10-CM | POA: Diagnosis not present

## 2021-02-18 DIAGNOSIS — M51369 Other intervertebral disc degeneration, lumbar region without mention of lumbar back pain or lower extremity pain: Secondary | ICD-10-CM

## 2021-02-18 LAB — COMPREHENSIVE METABOLIC PANEL
ALT: 31 U/L (ref 0–35)
AST: 21 U/L (ref 0–37)
Albumin: 4.1 g/dL (ref 3.5–5.2)
Alkaline Phosphatase: 48 U/L (ref 39–117)
BUN: 19 mg/dL (ref 6–23)
CO2: 31 mEq/L (ref 19–32)
Calcium: 9.6 mg/dL (ref 8.4–10.5)
Chloride: 104 mEq/L (ref 96–112)
Creatinine, Ser: 0.64 mg/dL (ref 0.40–1.20)
GFR: 92.14 mL/min (ref >60.00)
Glucose, Bld: 105 mg/dL — ABNORMAL HIGH (ref 70–99)
Potassium: 4.1 mEq/L (ref 3.5–5.1)
Sodium: 142 mEq/L (ref 135–145)
Total Bilirubin: 0.3 mg/dL (ref 0.2–1.2)
Total Protein: 6.3 g/dL (ref 6.0–8.3)

## 2021-02-18 LAB — CBC WITH DIFFERENTIAL/PLATELET
Basophils Absolute: 0 10*3/uL (ref 0.0–0.1)
Basophils Relative: 0.3 % (ref 0.0–3.0)
Eosinophils Absolute: 0.1 10*3/uL (ref 0.0–0.7)
Eosinophils Relative: 1 % (ref 0.0–5.0)
HCT: 37.3 % (ref 36.0–46.0)
Hemoglobin: 12 g/dL (ref 12.0–15.0)
Lymphocytes Relative: 34.4 % (ref 12.0–46.0)
Lymphs Abs: 2 10*3/uL (ref 0.7–4.0)
MCHC: 32.1 g/dL (ref 30.0–36.0)
MCV: 80.8 fl (ref 78.0–100.0)
Monocytes Absolute: 0.4 10*3/uL (ref 0.1–1.0)
Monocytes Relative: 6.4 % (ref 3.0–12.0)
Neutro Abs: 3.3 10*3/uL (ref 1.4–7.7)
Neutrophils Relative %: 57.9 % (ref 43.0–77.0)
Platelets: 182 10*3/uL (ref 150.0–400.0)
RBC: 4.61 Mil/uL (ref 3.87–5.11)
RDW: 16.3 % — ABNORMAL HIGH (ref 11.5–15.5)
WBC: 5.7 10*3/uL (ref 4.0–10.5)

## 2021-02-18 LAB — LIPID PANEL
Cholesterol: 158 mg/dL (ref 0–200)
HDL: 41 mg/dL (ref >39.00)
LDL Cholesterol: 94 mg/dL (ref 0–99)
NonHDL: 117.21
Total CHOL/HDL Ratio: 4
Triglycerides: 118 mg/dL (ref 0.0–149.0)
VLDL: 23.6 mg/dL (ref 0.0–40.0)

## 2021-02-18 MED ORDER — SERTRALINE HCL 50 MG PO TABS: 50.0000 mg | ORAL_TABLET | Freq: Every day | ORAL | 3 refills | Status: DC

## 2021-02-18 NOTE — Telephone Encounter (Signed)
Would you like to refill? 

## 2021-02-18 NOTE — Assessment & Plan Note (Signed)
Refer to pt  F/u with sport med prn --- she does not want injections

## 2021-02-18 NOTE — Progress Notes (Signed)
Patient ID: Kristin Delgado, female    DOB: 1956/10/20  Age: 65 y.o. MRN: [id number]    Subjective:  Subjective    HPI Kristin Delgado presents for f/u bp and cholesterol and depression.   Kristin Delgado feels like the zoloft could be increased.  Pt is still in [place] but has been frustrated that her weight is going up.    Kristin Delgado was also seeing Dr Tamala Julian for her back pain and mri was done---+ stenosis and bulging discs---- Kristin Delgado is also frustrated because Kristin Delgado can not exercise due to back pain  Review of Systems  Constitutional: Negative for appetite change, diaphoresis, fatigue and unexpected weight change.  Eyes: Negative for pain, redness and visual disturbance.  Respiratory: Negative for cough, chest tightness, shortness of breath and wheezing.   Cardiovascular: Negative for chest pain, palpitations and leg swelling.  Endocrine: Negative for cold intolerance, heat intolerance, polydipsia, polyphagia and polyuria.  Genitourinary: Negative for difficulty urinating, dysuria and frequency.  Musculoskeletal: Positive for back pain and gait problem.  Neurological: Negative for dizziness, light-headedness, numbness and headaches.  Psychiatric/Behavioral: Positive for dysphoric mood. Negative for decreased concentration.    History Past Medical History:  Diagnosis Date  . Allergy    seasonal  . Anxiety    closed space like MRI  . Back pain \  . Chest pain 2006   admitted, essentially negative  . Constipation   . Depression   . GERD (gastroesophageal reflux disease)   . Hyperlipidemia   . Hypertension   . Infertility, female   . Joint pain   . Pituitary microadenoma ([place])   . Swallowing difficulty   . 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   Her family history includes Alzheimer's disease in her father; Cancer (age of onset: 43) in her mother; Dementia  in her father; Lung cancer in her mother.Kristin Delgado reports that Kristin Delgado has never smoked. Kristin Delgado has never used smokeless tobacco. Kristin Delgado reports that Kristin Delgado does not drink alcohol and does not use drugs.  Current Outpatient Medications on File Prior to Visit  Medication Sig Dispense Refill  . acetaminophen (TYLENOL) 650 MG CR tablet Take 650 mg by mouth every 8 (eight) hours as needed. pain    . Ascorbic Acid (VITAMIN C) 1000 MG tablet Take 1,000 mg by mouth 2 (two) times daily.    [name] atorvastatin (LIPITOR) 20 MG tablet Take 1 tablet (20 mg total) by mouth daily. 90 tablet 1  . b complex vitamins tablet Take 1 tablet by mouth daily.    . bromocriptine (PARLODEL) 2.5 MG tablet Take 1 tablet (2.5 mg total) by mouth daily. 90 tablet 3  . Calcium Acetate, Phos Binder, (CALCIUM ACETATE PO) Take by mouth.    . Calcium Polycarbophil (FIBER-CAPS PO) Take by mouth.    . cholecalciferol (VITAMIN D3) 25 MCG (1000 UT) tablet Take 5,000 Units by mouth daily.    . ferrous sulfate 325 (65 FE) MG EC tablet Take 325 mg by mouth 3 (three) times daily with meals.    . gabapentin (NEURONTIN) 100 MG capsule Take 1 capsule (100 mg total) by mouth at bedtime. 90 capsule 0  . hydrochlorothiazide (HYDRODIURIL) 12.5 MG tablet Take 1 tablet (12.5 mg total) by mouth daily. 90 tablet 0  . lisinopril (ZESTRIL) 10 MG tablet Take 1 tablet (10 mg total) by mouth daily. 90 tablet 0  .  meloxicam (MOBIC) 7.5 MG tablet TAKE 1 TABLET BY MOUTH EVERY DAY 30 tablet 0  . metFORMIN (GLUCOPHAGE) 500 MG tablet TAKE 1 TABLET BY MOUTH EVERY DAY WITH BREAKFAST 30 tablet 0  . Multiple Vitamin (MULTIVITAMIN) tablet Take 1 tablet by mouth daily.    . Omega 3-6-9 Fatty Acids (OMEGA 3-6-9 COMPLEX PO) Take by mouth.    . Omega-3 Fatty Acids (FISH OIL) 1200 MG CAPS Take 1 capsule by mouth 2 (two) times daily.    . Probiotic Product (PROBIOTIC ADVANCED PO) Take by mouth.    [name] tiZANidine (ZANAFLEX) 4 MG tablet Take 0.5-1 tablets (2-4 mg total) by mouth every 6 (six)  hours as needed for muscle spasms. 30 tablet 0   No current facility-administered medications on file prior to visit.     Objective:  Objective  Physical Exam Vitals and nursing note reviewed.  Constitutional:      Appearance: Kristin Delgado is well-developed.  HENT:     Head: Normocephalic and atraumatic.  Eyes:     Conjunctiva/sclera: Conjunctivae normal.  Neck:     Thyroid: No thyromegaly.     Vascular: No carotid bruit or JVD.  Cardiovascular:     Rate and Rhythm: Normal rate and regular rhythm.     Heart sounds: Normal heart sounds. No murmur heard.   Pulmonary:     Effort: Pulmonary effort is normal. No respiratory distress.     Breath sounds: Normal breath sounds. No wheezing or rales.  Chest:     Chest wall: No tenderness.  Musculoskeletal:     Cervical back: Normal range of motion and neck supple.  Neurological:     Mental Status: Kristin Delgado is alert and oriented to person, place, and time.    BP 128/88 (BP Location: Right Arm, Patient Position: Sitting, Cuff Size: Large)   Pulse 60   Temp 98.3 F (36.8 C) (Oral)   Resp 18   Ht 5\' 3"  (1.6 m)   Wt 184 lb 6.4 oz (83.6 kg)   LMP 06/10/2011   SpO2 96%   BMI 32.66 kg/m  Wt Readings from Last 3 Encounters:  02/18/21 184 lb 6.4 oz (83.6 kg)  01/27/21 176 lb (79.8 kg)  01/09/21 179 lb (81.2 kg)     Lab Results  Component Value Date   WBC 5.8 08/20/2020   HGB 12.8 08/20/2020   HCT 40.0 08/20/2020   PLT 184 08/20/2020   GLUCOSE 111 (H) 08/20/2020   CHOL 164 08/20/2020   TRIG 141 08/20/2020   HDL 39 (L) 08/20/2020   LDLDIRECT 83.0 01/28/2017   LDLCALC 101 (H) 08/20/2020   ALT 40 (H) 08/20/2020   AST 23 08/20/2020   NA 140 08/20/2020   K 4.3 08/20/2020   CL 103 08/20/2020   CREATININE 0.67 08/20/2020   BUN 21 08/20/2020   CO2 30 08/20/2020   TSH 2.340 12/01/2019   HGBA1C 5.4 11/23/2019    MR Lumbar Spine Wo Contrast  Result Date: 02/14/2021 CLINICAL DATA:  Low back and left hip pain for 2 months. EXAM: MRI  LUMBAR SPINE WITHOUT CONTRAST TECHNIQUE: Multiplanar, multisequence MR imaging of the lumbar spine was performed. No intravenous contrast was administered. COMPARISON:  06/19/2010 FINDINGS: Segmentation:  Standard. Alignment: Mild lower lumbar levoscoliosis. Trace retrolisthesis of L3 on L4. Vertebrae: Mild chronic L2 superior endplate compression fracture. No acute fracture or suspicious osseous lesion. Mild left-sided degenerative endplate edema at X5-T7. Modic type 2 endplate changes at multiple levels. Hemangioma in the L4 vertebral body. Scattered small  Schmorl's nodes. Conus medullaris and cauda equina: Conus extends to the L1-2 level. Conus and cauda equina appear normal. Paraspinal and other soft tissues: Unremarkable. Disc levels: Disc desiccation from T12-L1 to L5-S1 with associated disc space narrowing which is moderate at L3-4 and L4-5 and mild elsewhere. T11-12: Negative. T12-L1: Minimal disc bulging without stenosis. L1-2: The right paracentral disc extrusion on the prior study has regressed. There is a small residual disc protrusion in this location with minimal disc bulging and endplate spurring. No stenosis. L2-3: New shallow left foraminal disc protrusion and mild facet hypertrophy without stenosis. L3-4: Increased disc bulging, a new left foraminal disc extrusion, and mild facet and ligamentum flavum hypertrophy result in borderline to mild spinal and lateral recess stenosis and moderate to severe left neural foraminal stenosis with left L3 nerve root impingement. L4-5: A right paracentral to right foraminal disc extrusion on the prior MRI has involuted. Right eccentric disc bulging, endplate spurring, disc space height loss, and moderate facet and ligamentum flavum hypertrophy result in mild spinal stenosis and moderate right neural foraminal stenosis. L5-S1: Disc bulging, endplate spurring, and mild-to-moderate facet hypertrophy without significant stenosis, similar to the prior MRI. IMPRESSION:  1. New left foraminal disc extrusion at L3-4 with left L3 nerve root impingement. 2. New shallow left foraminal disc protrusion at L2-3 without stenosis. 3. Regression of L1-2 and L4-5 disc herniations. Mild multifactorial spinal stenosis and moderate right neural foraminal stenosis at L4-5. Electronically Signed   By: Logan Bores M.D.   On: 02/14/2021 09:35     Assessment & Plan:  Plan  I have discontinued Selinda Shutes's predniSONE and sertraline. I am also having her start on sertraline. Additionally, I am having her maintain her multivitamin, vitamin C, acetaminophen, Fish Oil, Omega 3-6-9 Fatty Acids (OMEGA 3-6-9 COMPLEX PO), Probiotic Product (PROBIOTIC ADVANCED PO), Calcium Polycarbophil (FIBER-CAPS PO), ferrous sulfate, b complex vitamins, cholecalciferol, (Calcium Acetate, Phos Binder, (CALCIUM ACETATE PO)), bromocriptine, atorvastatin, metFORMIN, tiZANidine, gabapentin, hydrochlorothiazide, lisinopril, and meloxicam.  Meds ordered this encounter  Medications  . sertraline (ZOLOFT) 50 MG tablet    Sig: Take 1 tablet (50 mg total) by mouth daily.    Dispense:  90 tablet    Refill:  3    Problem List Items Addressed This Visit      Unprioritized   Bulge of lumbar disc without myelopathy    Refer to PT  F/u with sport med       Relevant Orders   Ambulatory referral to Physical Therapy   Depression with anxiety    Inc zoloft to 50 mg daily        Relevant Medications   sertraline (ZOLOFT) 50 MG tablet   Essential hypertension    Well controlled, no changes to meds. Encouraged heart healthy diet such as the DASH diet and exercise as tolerated.       Hyperlipidemia    Tolerating statin, encouraged heart healthy diet, avoid trans fats, minimize simple carbs and saturated fats. Increase exercise as tolerated      Relevant Orders   Lipid panel   Comprehensive metabolic panel   Pituitary adenoma ([place])    Check prolactin       Spinal stenosis of lumbar region - Primary     Refer to pt  F/u with sport med prn --- Kristin Delgado does not want injections       Relevant Orders   Ambulatory referral to Physical Therapy    Other Visit Diagnoses    Pituitary microadenoma ([place])  Relevant Orders   Comprehensive metabolic panel   Prolactin   Primary hypertension       Relevant Orders   CBC with Differential/Platelet   Depression, major, single episode, mild (HCC)       Relevant Medications   sertraline (ZOLOFT) 50 MG tablet      Follow-up: Return in about 6 months (around 08/20/2021), or if symptoms worsen or fail to improve, for hypertension, hyperlipidemia, diabetes II.  [name], DO

## 2021-02-18 NOTE — Assessment & Plan Note (Signed)
Inc zoloft to 50 mg daily

## 2021-02-18 NOTE — Assessment & Plan Note (Signed)
Check prolactin

## 2021-02-18 NOTE — Patient Instructions (Signed)

## 2021-02-18 NOTE — Assessment & Plan Note (Signed)
Refer to PT  F/u with sport med

## 2021-02-18 NOTE — Assessment & Plan Note (Signed)
Well controlled, no changes to meds. Encouraged heart healthy diet such as the DASH diet and exercise as tolerated.  °

## 2021-02-18 NOTE — Assessment & Plan Note (Signed)
Tolerating statin, encouraged heart healthy diet, avoid trans fats, minimize simple carbs and saturated fats. Increase exercise as tolerated 

## 2021-02-19 LAB — PROLACTIN: Prolactin: 9.3 ng/mL

## 2021-02-26 ENCOUNTER — Telehealth: Payer: Self-pay | Admitting: Cardiovascular Disease

## 2021-02-26 NOTE — Telephone Encounter (Signed)
Spoke with patientregarding Thursday 03/06/21 10:30 am appointment for the CTA chest/aorta ordered by Dr. Laury Axon time is 10:00 am--1st floor radiology at [facility]---liquids only 4 hours prior to study---patient voiced her understanding.

## 2021-03-02 DIAGNOSIS — I10 Essential (primary) hypertension: Secondary | ICD-10-CM | POA: Diagnosis not present

## 2021-03-06 ENCOUNTER — Other Ambulatory Visit: Payer: Self-pay

## 2021-03-06 ENCOUNTER — Ambulatory Visit (HOSPITAL_COMMUNITY)
Admission: RE | Admit: 2021-03-06 | Discharge: 2021-03-06 | Disposition: A | Discharge status: Home / Self Care | Payer: Medicare Other | Source: Ambulatory Visit | Attending: Cardiovascular Disease | Admitting: Cardiovascular Disease

## 2021-03-06 DIAGNOSIS — K449 Diaphragmatic hernia without obstruction or gangrene: Secondary | ICD-10-CM | POA: Diagnosis not present

## 2021-03-06 DIAGNOSIS — I2699 Other pulmonary embolism without acute cor pulmonale: Secondary | ICD-10-CM | POA: Diagnosis not present

## 2021-03-06 DIAGNOSIS — I712 Thoracic aortic aneurysm, without rupture, unspecified: Secondary | ICD-10-CM

## 2021-03-06 MED ORDER — IOHEXOL 350 MG/ML SOLN
75.0000 mL | Freq: Once | INTRAVENOUS | Status: AC | PRN
  Administered 2021-03-06: 75 mL via INTRAVENOUS

## 2021-03-07 ENCOUNTER — Emergency Department (HOSPITAL_BASED_OUTPATIENT_CLINIC_OR_DEPARTMENT_OTHER): Payer: Medicare Other

## 2021-03-07 ENCOUNTER — Telehealth: Payer: Self-pay

## 2021-03-07 ENCOUNTER — Other Ambulatory Visit: Payer: Self-pay

## 2021-03-07 ENCOUNTER — Encounter (HOSPITAL_COMMUNITY): Payer: Self-pay

## 2021-03-07 ENCOUNTER — Emergency Department (HOSPITAL_COMMUNITY)
Admission: EM | Admit: 2021-03-07 | Discharge: 2021-03-07 | Disposition: A | Discharge status: Home / Self Care | Payer: Medicare Other | Attending: Emergency Medicine | Admitting: Emergency Medicine

## 2021-03-07 DIAGNOSIS — Z79899 Other long term (current) drug therapy: Secondary | ICD-10-CM | POA: Diagnosis not present

## 2021-03-07 DIAGNOSIS — I10 Essential (primary) hypertension: Secondary | ICD-10-CM | POA: Insufficient documentation

## 2021-03-07 DIAGNOSIS — M7989 Other specified soft tissue disorders: Secondary | ICD-10-CM | POA: Diagnosis not present

## 2021-03-07 DIAGNOSIS — Y92410 Unspecified street and highway as the place of occurrence of the external cause: Secondary | ICD-10-CM | POA: Diagnosis not present

## 2021-03-07 DIAGNOSIS — I2693 Single subsegmental pulmonary embolism without acute cor pulmonale: Secondary | ICD-10-CM | POA: Insufficient documentation

## 2021-03-07 DIAGNOSIS — R609 Edema, unspecified: Secondary | ICD-10-CM

## 2021-03-07 DIAGNOSIS — R079 Chest pain, unspecified: Secondary | ICD-10-CM | POA: Diagnosis present

## 2021-03-07 LAB — COMPREHENSIVE METABOLIC PANEL
ALT: 19 U/L (ref 0–44)
AST: 18 U/L (ref 15–41)
Albumin: 3.5 g/dL (ref 3.5–5.0)
Alkaline Phosphatase: 44 U/L (ref 38–126)
Anion gap: 10 (ref 5–15)
BUN: 12 mg/dL (ref 8–23)
CO2: 26 mmol/L (ref 22–32)
Calcium: 9.2 mg/dL (ref 8.9–10.3)
Chloride: 106 mmol/L (ref 98–111)
Creatinine, Ser: 0.67 mg/dL (ref 0.44–1.00)
GFR, Estimated: >60 mL/min (ref >60)
Glucose, Bld: 95 mg/dL (ref 70–99)
Potassium: 3.4 mmol/L — ABNORMAL LOW (ref 3.5–5.1)
Sodium: 142 mmol/L (ref 135–145)
Total Bilirubin: 0.7 mg/dL (ref 0.3–1.2)
Total Protein: 6.2 g/dL — ABNORMAL LOW (ref 6.5–8.1)

## 2021-03-07 LAB — CBG MONITORING, ED: Glucose-Capillary: 83 mg/dL (ref 70–99)

## 2021-03-07 MED ORDER — RIVAROXABAN (XARELTO) VTE STARTER PACK (15 & 20 MG): ORAL_TABLET | ORAL | 0 refills | Status: DC

## 2021-03-07 MED ORDER — RIVAROXABAN 15 MG PO TABS
15.0000 mg | ORAL_TABLET | Freq: Once | ORAL | Status: AC
  Administered 2021-03-07: 15 mg via ORAL
  Filled 2021-03-07: qty 1

## 2021-03-07 MED ORDER — RIVAROXABAN (XARELTO) EDUCATION KIT FOR DVT/PE PATIENTS
PACK | Freq: Once | Status: AC
  Filled 2021-03-07: qty 1
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
## 2021-03-07 NOTE — Discharge Instructions (Addendum)
Start the Xarelto prescription tomorrow morning.  See your primary care doctor for checkup next week.  Return here if needed for problems or concerns.  Watch for worsening trouble breathing, weakness, dizziness, nausea, vomiting or inability to tolerate medication

## 2021-03-07 NOTE — ED Triage Notes (Signed)
Pt sent by cardiologist for further eval of abnormal CT chest she had done yesterday. Ct showed a pulmonary emboli. Pt has intermittent chest pain for the past year but denies any at this time, pt also reports some mild sob when walking long distances. Pt a.o resp e.u at this time

## 2021-03-07 NOTE — Progress Notes (Signed)
Lower extremity venous has been completed.   Preliminary results in CV Proc.   Kristin Delgado 03/07/2021 2:26 PM

## 2021-03-11 ENCOUNTER — Ambulatory Visit (INDEPENDENT_AMBULATORY_CARE_PROVIDER_SITE_OTHER): Payer: Medicare Other | Admitting: Family Medicine

## 2021-03-11 ENCOUNTER — Other Ambulatory Visit: Payer: Self-pay

## 2021-03-11 ENCOUNTER — Encounter: Payer: Self-pay | Admitting: Family Medicine

## 2021-03-11 ENCOUNTER — Ambulatory Visit (INDEPENDENT_AMBULATORY_CARE_PROVIDER_SITE_OTHER): Payer: Medicare Other | Admitting: Bariatrics

## 2021-03-11 VITALS — BP 142/100 | HR 77 | Temp 99.0°F | Ht 63.0 in | Wt 185.1 lb

## 2021-03-11 DIAGNOSIS — R0789 Other chest pain: Secondary | ICD-10-CM

## 2021-03-11 MED ORDER — TRAMADOL HCL 50 MG PO TABS: 50.0000 mg | ORAL_TABLET | Freq: Three times a day (TID) | ORAL | 0 refills | Status: AC | PRN

## 2021-03-11 NOTE — Patient Instructions (Signed)
Ice/cold pack over area for 10-15 min twice daily.  Heat (pad or rice pillow in microwave) over affected area, 10-15 minutes twice daily.   OK to take Tylenol 1000 mg (2 extra strength tabs) or 975 mg (3 regular strength tabs) every 6 hours as needed.  Do not drink alcohol, do any illicit/street drugs, drive or do anything that requires alertness while on this medicine.   Make sure to routinely take deep breaths while awake. If you start having coughing, fevers or shortness of breath, seek care.  Let us know if you need anything.   Pectoralis Major Rehab Ask your health care provider which exercises are safe for you. Do exercises exactly as told by your health care provider and adjust them as directed. It is normal to feel mild stretching, pulling, tightness, or discomfort as you do these exercises, but you should stop right away if you feel sudden pain or your pain gets worse.Do not begin these exercises until told by your health care provider. Stretching and range of motion exercises These exercises warm up your muscles and joints and improve the movement and flexibility of your shoulder. These exercises can also help to relieve pain, numbness, and tingling. Exercise A: Pendulum  1. Stand near a wall or a surface that you can hold onto for balance. 2. Bend at the waist and let your left / right arm hang straight down. Use your other arm to keep your balance. 3. Relax your arm and shoulder muscles, and move your hips and your trunk so your left / right arm swings freely. Your arm should swing because of the motion of your body, not because you are using your arm or shoulder muscles. 4. Keep moving so your arm swings in the following directions, as told by your health care provider: ? Side to side. ? Forward and backward. ? In clockwise and counterclockwise circles. 5. Slowly return to the starting position. Repeat 2 times. Complete this exercise 3 times per week. Exercise B: Abduction,  standing 1. Stand and hold a broomstick, a cane, or a similar object. Place your hands a little more than shoulder-width apart on the object. Your left / right hand should be palm-up, and your other hand should be palm-down. 2. While keeping your elbow straight and your shoulder muscles relaxed, push the stick across your body toward your left / right side. Raise your left / right arm to the side of your body and then over your head until you feel a stretch in your shoulder. ? Stop when you reach the angle that is recommended by your health care provider. ? Avoid shrugging your shoulder while you raise your arm. Keep your shoulder blade tucked down toward the middle of your spine. 3. Hold for 10 seconds. 4. Slowly return to the starting position. Repeat 2 times. Complete this exercise 3 times per week. Exercise C: Wand flexion, supine  1. Lie on your back. You may bend your knees for comfort. 2. Hold a broomstick, a cane, or a similar object so that your hands are about shoulder-width apart on the object. Your palms should face toward your feet. 3. Raise your left / right arm in front of your face, then behind your head (toward the floor). Use your other hand to help you do this. Stop when you feel a gentle stretch in your shoulder, or when you reach the angle that is recommended by your health care provider. 4. Hold for 3 seconds. 5. Use the broomstick and  your other arm to help you return your left / right arm to the starting position. Repeat 2 times. Complete this exercise 3 times per week. Exercise D: Wand shoulder external rotation 1. Stand and hold a broomstick, a cane, or a similar object so your handsare about shoulder-width apart on the object. 2. Start with your arms hanging down, then bend both elbows to an "L" shape (90 degrees). 3. Keep your left / right elbow at your side. Use your other hand to push the stick so your left / right forearm moves away from your body, out to your  side. ? Keep your left / right elbow bent to 90 degrees and keep it against your side. ? Stop when you feel a gentle stretch in your shoulder, or when you reach the angle recommended by your health care provider. 4. Hold for 10 seconds. 5. Use the stick to help you return your left / right arm to the starting position. Repeat 2 times. Complete this exercise 3 times per week. Strengthening exercises These exercises build strength and endurance in your shoulder. Endurance is the ability to use your muscles for a long time, even after your muscles get tired. Exercise E: Scapular protraction, standing 1. Stand so you are facing a wall. Place your feet about one arm-length away from the wall. 2. Place your hands on the wall and straighten your elbows. 3. Keep your hands on the wall as you push your upper back away from the wall. You should feel your shoulder blades sliding forward.Keep your elbows and your head still. ? If you are not sure that you are doing this exercise correctly, ask your health care provider for more instructions. 4. Hold for 3 seconds. 5. Slowly return to the starting position. Let your muscles relax completely before you repeat this exercise. Repeat 2 times. Complete this exercise 3 times per week. Exercise F: Shoulder blade squeezes  (scapular retraction) 1. Sit with good posture in a stable chair. Do not let your back touch the back of the chair. 2. Your arms should be at your sides with your elbows bent. You may rest your forearms on a pillow if that is more comfortable. 3. Squeeze your shoulder blades together. Bring them down and back. ? Keep your shoulders level. ? Do not lift your shoulders up toward your ears. 4. Hold for 3 seconds. 5. Return to the starting position. Repeat 2 times. Complete this exercise 3 times per week. This information is not intended to replace advice given to you by your health care provider. Make sure you discuss any questions you have  with your health care provider. Document Released: 11/02/2005 Document Revised: 08/13/2016 Document Reviewed: 07/21/2015 Elsevier Interactive Patient Education  [employer].

## 2021-03-11 NOTE — Telephone Encounter (Signed)
MD aware- results sent to patient via mychart.
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
## 2021-03-14 ENCOUNTER — Ambulatory Visit (INDEPENDENT_AMBULATORY_CARE_PROVIDER_SITE_OTHER): Payer: Medicare Other | Admitting: Cardiovascular Disease

## 2021-03-14 ENCOUNTER — Other Ambulatory Visit: Payer: Self-pay

## 2021-03-14 ENCOUNTER — Encounter: Payer: Self-pay | Admitting: Cardiovascular Disease

## 2021-03-14 VITALS — BP 132/88 | HR 74 | Ht 63.0 in | Wt 183.2 lb

## 2021-03-14 DIAGNOSIS — I2699 Other pulmonary embolism without acute cor pulmonale: Secondary | ICD-10-CM

## 2021-03-14 DIAGNOSIS — I10 Essential (primary) hypertension: Secondary | ICD-10-CM | POA: Diagnosis not present

## 2021-03-14 DIAGNOSIS — I712 Thoracic aortic aneurysm, without rupture, unspecified: Secondary | ICD-10-CM

## 2021-03-14 MED ORDER — RIVAROXABAN 20 MG PO TABS: 20.0000 mg | ORAL_TABLET | Freq: Every day | ORAL | 1 refills | Status: DC

## 2021-03-14 NOTE — Patient Instructions (Signed)
Medication Instructions:  Continue to take Xarelto- after 3 months you may stop.  *If you need a refill on your cardiac medications before your next appointment, please call your pharmacy*   Lab Work: BMET (1 year)   If you have labs (blood work) drawn today and your tests are completely normal, you will receive your results only by: [name] MyChart Message (if you have MyChart) OR . A paper copy in the mail If you have any lab test that is abnormal or we need to change your treatment, we will call you to review the results.   Testing/Procedures: CT CHEST AORTA (in 1 year)   Follow-Up: At [facility], you and your health needs are our priority.  As part of our continuing mission to provide you with exceptional heart care, we have created designated Provider Care Teams.  These Care Teams include your primary Cardiologist (physician) and Advanced Practice Providers (APPs -  Physician Assistants and Nurse Practitioners) who all work together to provide you with the care you need, when you need it.  We recommend signing up for the patient portal called "MyChart".  Sign up information is provided on this After Visit Summary.  MyChart is used to connect with patients for Virtual Visits (Telemedicine).  Patients are able to view lab/test results, encounter notes, upcoming appointments, etc.  Non-urgent messages can be sent to your provider as well.   To learn more about what you can do with MyChart, go to [email or web address].    Your next appointment:   12 month(s)  The format for your next appointment:   In Person  Provider:   Eleonore Chiquito, MD

## 2021-03-16 ENCOUNTER — Other Ambulatory Visit (INDEPENDENT_AMBULATORY_CARE_PROVIDER_SITE_OTHER): Payer: Self-pay | Admitting: Family Medicine

## 2021-03-16 DIAGNOSIS — E8881 Metabolic syndrome: Secondary | ICD-10-CM

## 2021-03-17 ENCOUNTER — Ambulatory Visit (INDEPENDENT_AMBULATORY_CARE_PROVIDER_SITE_OTHER): Payer: Medicare Other | Admitting: Bariatrics

## 2021-03-17 ENCOUNTER — Other Ambulatory Visit: Payer: Self-pay

## 2021-03-17 ENCOUNTER — Encounter (INDEPENDENT_AMBULATORY_CARE_PROVIDER_SITE_OTHER): Payer: Self-pay | Admitting: Bariatrics

## 2021-03-17 VITALS — BP 131/83 | HR 63 | Temp 98.0°F | Ht 63.0 in | Wt 176.0 lb

## 2021-03-17 DIAGNOSIS — Z6833 Body mass index (BMI) 33.0-33.9, adult: Secondary | ICD-10-CM

## 2021-03-17 DIAGNOSIS — E7849 Other hyperlipidemia: Secondary | ICD-10-CM

## 2021-03-17 DIAGNOSIS — I10 Essential (primary) hypertension: Secondary | ICD-10-CM

## 2021-03-17 DIAGNOSIS — E669 Obesity, unspecified: Secondary | ICD-10-CM

## 2021-03-17 DIAGNOSIS — E8881 Metabolic syndrome: Secondary | ICD-10-CM

## 2021-03-17 MED ORDER — HYDROCHLOROTHIAZIDE 12.5 MG PO TABS: 12.5000 mg | ORAL_TABLET | Freq: Every day | ORAL | 0 refills | Status: DC

## 2021-03-17 MED ORDER — METFORMIN HCL 500 MG PO TABS: 500.0000 mg | ORAL_TABLET | Freq: Every day | ORAL | 0 refills | Status: DC

## 2021-03-17 MED ORDER — ATORVASTATIN CALCIUM 20 MG PO TABS
20.0000 mg | ORAL_TABLET | Freq: Every evening | ORAL | 0 refills | Status: DC
Start: 2021-03-17 — End: 2021-08-12

## 2021-03-17 MED ORDER — LISINOPRIL 10 MG PO TABS: 10.0000 mg | ORAL_TABLET | Freq: Every day | ORAL | 0 refills | Status: DC

## 2021-03-18 ENCOUNTER — Encounter (INDEPENDENT_AMBULATORY_CARE_PROVIDER_SITE_OTHER): Payer: Self-pay | Admitting: Bariatrics
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
## 2021-04-08 ENCOUNTER — Other Ambulatory Visit: Payer: Self-pay

## 2021-04-08 ENCOUNTER — Ambulatory Visit (INDEPENDENT_AMBULATORY_CARE_PROVIDER_SITE_OTHER): Payer: Medicare Other | Admitting: Family Medicine

## 2021-04-08 ENCOUNTER — Encounter (INDEPENDENT_AMBULATORY_CARE_PROVIDER_SITE_OTHER): Payer: Self-pay | Admitting: Family Medicine

## 2021-04-08 VITALS — BP 131/82 | HR 61 | Temp 98.2°F | Ht 63.0 in | Wt 177.0 lb

## 2021-04-08 DIAGNOSIS — E669 Obesity, unspecified: Secondary | ICD-10-CM | POA: Diagnosis not present

## 2021-04-08 DIAGNOSIS — I2693 Single subsegmental pulmonary embolism without acute cor pulmonale: Secondary | ICD-10-CM

## 2021-04-08 DIAGNOSIS — F320 Major depressive disorder, single episode, mild: Secondary | ICD-10-CM

## 2021-04-08 DIAGNOSIS — Z6831 Body mass index (BMI) 31.0-31.9, adult: Secondary | ICD-10-CM

## 2021-04-08 MED ORDER — BUPROPION HCL ER (SR) 100 MG PO TB12: 100.0000 mg | ORAL_TABLET | Freq: Every day | ORAL | 0 refills | Status: DC

## 2021-04-08 MED ORDER — SERTRALINE HCL 50 MG PO TABS: 75.0000 mg | ORAL_TABLET | Freq: Every day | ORAL | 3 refills | Status: DC
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
## 2021-05-01 ENCOUNTER — Other Ambulatory Visit (INDEPENDENT_AMBULATORY_CARE_PROVIDER_SITE_OTHER): Payer: Self-pay | Admitting: Family Medicine

## 2021-05-01 DIAGNOSIS — F320 Major depressive disorder, single episode, mild: Secondary | ICD-10-CM

## 2021-05-01 NOTE — Telephone Encounter (Signed)
Refill request

## 2021-05-20 ENCOUNTER — Ambulatory Visit (INDEPENDENT_AMBULATORY_CARE_PROVIDER_SITE_OTHER): Payer: Medicare Other | Admitting: Family Medicine

## 2021-05-20 ENCOUNTER — Encounter (INDEPENDENT_AMBULATORY_CARE_PROVIDER_SITE_OTHER): Payer: Self-pay | Admitting: Family Medicine

## 2021-05-20 ENCOUNTER — Other Ambulatory Visit: Payer: Self-pay

## 2021-05-20 VITALS — BP 151/81 | HR 61 | Temp 98.3°F | Ht 63.0 in | Wt 178.0 lb

## 2021-05-20 DIAGNOSIS — F418 Other specified anxiety disorders: Secondary | ICD-10-CM

## 2021-05-20 DIAGNOSIS — E7849 Other hyperlipidemia: Secondary | ICD-10-CM

## 2021-05-20 DIAGNOSIS — E669 Obesity, unspecified: Secondary | ICD-10-CM

## 2021-05-20 DIAGNOSIS — Z6831 Body mass index (BMI) 31.0-31.9, adult: Secondary | ICD-10-CM
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
## 2021-05-25 ENCOUNTER — Other Ambulatory Visit (INDEPENDENT_AMBULATORY_CARE_PROVIDER_SITE_OTHER): Payer: Self-pay | Admitting: Adult Health

## 2021-05-25 DIAGNOSIS — F320 Major depressive disorder, single episode, mild: Secondary | ICD-10-CM

## 2021-05-28 ENCOUNTER — Other Ambulatory Visit: Payer: Self-pay | Admitting: Cardiovascular Disease

## 2021-05-28 NOTE — Telephone Encounter (Signed)
47f, 80.7kg, scr 0.67 03/07/21/, lovw/oneal 03/14/21, ccr 105.2

## 2021-06-24 ENCOUNTER — Other Ambulatory Visit (INDEPENDENT_AMBULATORY_CARE_PROVIDER_SITE_OTHER): Payer: Self-pay | Admitting: Bariatrics

## 2021-06-24 ENCOUNTER — Encounter (INDEPENDENT_AMBULATORY_CARE_PROVIDER_SITE_OTHER): Payer: Self-pay

## 2021-06-24 DIAGNOSIS — E8881 Metabolic syndrome: Secondary | ICD-10-CM

## 2021-06-24 NOTE — Telephone Encounter (Signed)
Message sent to pt-[facility] 

## 2021-07-01 ENCOUNTER — Ambulatory Visit (INDEPENDENT_AMBULATORY_CARE_PROVIDER_SITE_OTHER): Payer: Medicare Other | Admitting: Family Medicine

## 2021-07-01 ENCOUNTER — Encounter (INDEPENDENT_AMBULATORY_CARE_PROVIDER_SITE_OTHER): Payer: Self-pay | Admitting: Family Medicine

## 2021-07-01 ENCOUNTER — Other Ambulatory Visit: Payer: Self-pay

## 2021-07-01 VITALS — BP 150/90 | HR 63 | Temp 98.1°F | Ht 63.0 in | Wt 181.0 lb

## 2021-07-01 DIAGNOSIS — I10 Essential (primary) hypertension: Secondary | ICD-10-CM

## 2021-07-01 DIAGNOSIS — F32A Depression, unspecified: Secondary | ICD-10-CM | POA: Diagnosis not present

## 2021-07-01 DIAGNOSIS — F419 Anxiety disorder, unspecified: Secondary | ICD-10-CM

## 2021-07-01 DIAGNOSIS — Z6838 Body mass index (BMI) 38.0-38.9, adult: Secondary | ICD-10-CM | POA: Diagnosis not present

## 2021-07-01 DIAGNOSIS — E8881 Metabolic syndrome: Secondary | ICD-10-CM

## 2021-07-01 MED ORDER — SERTRALINE HCL 50 MG PO TABS: 25.0000 mg | ORAL_TABLET | Freq: Every day | ORAL | 0 refills | Status: DC

## 2021-07-01 MED ORDER — METFORMIN HCL 500 MG PO TABS: 500.0000 mg | ORAL_TABLET | Freq: Every day | ORAL | 0 refills | Status: DC

## 2021-07-02 NOTE — Progress Notes (Signed)
Chief Complaint:   OBESITY Kristin Delgado is here to discuss Kristin Delgado progress with Kristin Delgado obesity treatment plan along with follow-up of Kristin Delgado obesity related diagnoses. Kristin Delgado is on keeping a food journal and adhering to recommended goals of 1250 calories and 75-80 grams protein and states she is following Kristin Delgado eating plan approximately 50% of the time. Kristin Delgado states she is walking 1/2 mile 4 times per week.  Today's visit was #: 55 Starting weight: 189 lbs Starting date: 10/12/2018 Today's weight: 181 lbs Today's date: 07/01/2021 Total lbs lost to date: 8 Total lbs lost since last in-office visit: 0  Interim History: Kristin Delgado hasn't had water since July and insurance is refusing to pay for restoration. She is waiting for any resolution of water situation. Stress wise, she is really struggling with this and Kristin Delgado daughter's repetitive suicide attempts. She hasn't been journaling due to stress and often going long periods of time without eating.  Subjective:   1. Essential hypertension BP elevated today. Pt denies chest pain/chest pressure/headache. She is on lisinopril 10 mg.  2. Anxiety and depression Kristin Delgado is on Wellbutrin and sertraline. She stopped sertraline due to side effects of burning mouth and tingling that stopped with cessation.  3. Insulin resistance Kristin Delgado A1c recently was 5.4 and insulin level 10.3. She denies GI side effects.  Assessment/Plan:   1. Essential hypertension Kristin Delgado is working on healthy weight loss and exercise to improve blood pressure control. We will watch for signs of hypotension as she continues Kristin Delgado lifestyle modifications. Increase lisinopril to 20 mg. Pt is to check BP up to 3 times a week at home.  2. Anxiety and depression Follow up at next appt. Continue 25 mg sertraline and 100 mg Wellbutrin. Behavior modification techniques were discussed today to help Kristin Delgado deal with Kristin Delgado anxiety.  Orders and follow up as documented in patient record.   Refill-  sertraline (ZOLOFT) 50 MG tablet; Take 0.5 tablets (25 mg total) by mouth daily.  Dispense: 90 tablet; Refill: 0  3. Insulin resistance Kristin Delgado will continue to work on weight loss, exercise, and decreasing simple carbohydrates to help decrease the risk of diabetes. Kristin Delgado agreed to follow-up with [name] as directed to closely monitor Kristin Delgado progress.  Refill- metFORMIN (GLUCOPHAGE) 500 MG tablet; Take 1 tablet (500 mg total) by mouth at bedtime.  Dispense: 90 tablet; Refill: 0  4. Obesity with current BMI of 32.2  Kristin Delgado is currently in the action stage of change. As such, Kristin Delgado goal is to continue with weight loss efforts. She has agreed to keeping a food journal and adhering to recommended goals of 1250 calories and 75-80+ grams protein.   Exercise goals: All adults should avoid inactivity. Some physical activity is better than none, and adults who participate in any amount of physical activity gain some health benefits.  Behavioral modification strategies: increasing lean protein intake, meal planning and cooking strategies, keeping healthy foods in the home, and planning for success.  Kristin Delgado has agreed to follow-up with our clinic in 6 weeks. She was informed of the importance of frequent follow-up visits to maximize Kristin Delgado success with intensive lifestyle modifications for Kristin Delgado multiple health conditions.   Objective:   Blood pressure (!) 150/90, pulse 63, temperature 98.1 F (36.7 C), height '5\' 3"'$  (1.6 m), weight 181 lb (82.1 kg), last menstrual period 06/10/2011, SpO2 97 %. Body mass index is 32.06 kg/m.  General: Cooperative, alert, well developed, in no acute distress. HEENT: Conjunctivae and lids unremarkable. Cardiovascular: Regular rhythm.  Lungs:  Normal work of breathing. Neurologic: No focal deficits.   Lab Results  Component Value Date   CREATININE 0.67 03/07/2021   BUN 12 03/07/2021   NA 142 03/07/2021   K 3.4 (L) 03/07/2021   CL 106 03/07/2021   CO2 26 03/07/2021   Lab  Results  Component Value Date   ALT 19 03/07/2021   AST 18 03/07/2021   ALKPHOS 44 03/07/2021   BILITOT 0.7 03/07/2021   Lab Results  Component Value Date   HGBA1C 5.4 11/23/2019   HGBA1C 5.2 06/22/2019   HGBA1C 5.3 01/23/2019   HGBA1C 5.6 10/12/2018   HGBA1C 6.1 01/28/2017   Lab Results  Component Value Date   INSULIN 10.3 11/23/2019   INSULIN 6.7 06/22/2019   INSULIN 6.2 01/23/2019   INSULIN 5.9 10/12/2018   Lab Results  Component Value Date   TSH 2.340 12/01/2019   Lab Results  Component Value Date   CHOL 158 02/18/2021   HDL 41.00 02/18/2021   LDLCALC 94 02/18/2021   LDLDIRECT 83.0 01/28/2017   TRIG 118.0 02/18/2021   CHOLHDL 4 02/18/2021   Lab Results  Component Value Date   VD25OH 86.5 11/23/2019   VD25OH 52.5 01/23/2019   VD25OH 41.1 10/12/2018   Lab Results  Component Value Date   WBC 5.7 02/18/2021   HGB 12.0 02/18/2021   HCT 37.3 02/18/2021   MCV 80.8 02/18/2021   PLT 182.0 02/18/2021   Lab Results  Component Value Date   IRON 52 12/12/2007    Obesity Behavioral Intervention:   Approximately 15 minutes were spent on the discussion below.  ASK: We discussed the diagnosis of obesity with Kristin Delgado today and Kristin Delgado agreed to give [name] permission to discuss obesity behavioral modification therapy today.  ASSESS: Kristin Delgado has the diagnosis of obesity and Kristin Delgado BMI today is 32.2. Kristin Delgado is in the action stage of change.   ADVISE: Kristin Delgado was educated on the multiple health risks of obesity as well as the benefit of weight loss to improve Kristin Delgado health. She was advised of the need for long term treatment and the importance of lifestyle modifications to improve Kristin Delgado current health and to decrease Kristin Delgado risk of future health problems.  AGREE: Multiple dietary modification options and treatment options were discussed and Kristin Delgado agreed to follow the recommendations documented in the above note.  ARRANGE: Kristin Delgado was educated on the importance of frequent  visits to treat obesity as outlined per CMS and USPSTF guidelines and agreed to schedule Kristin Delgado next follow up appointment today.  Attestation Statements:   Reviewed by clinician on day of visit: allergies, medications, problem list, medical history, surgical history, family history, social history, and previous encounter notes.  Coral Ceo, CMA, am acting as transcriptionist for Coralie Common, MD.  I have reviewed the above documentation for accuracy and completeness, and I agree with the above. - Coralie Common, MD

## 2021-07-06 ENCOUNTER — Other Ambulatory Visit (INDEPENDENT_AMBULATORY_CARE_PROVIDER_SITE_OTHER): Payer: Self-pay | Admitting: Bariatrics

## 2021-07-06 DIAGNOSIS — E7849 Other hyperlipidemia: Secondary | ICD-10-CM

## 2021-07-06 DIAGNOSIS — I10 Essential (primary) hypertension: Secondary | ICD-10-CM

## 2021-07-07 ENCOUNTER — Encounter (INDEPENDENT_AMBULATORY_CARE_PROVIDER_SITE_OTHER): Payer: Self-pay

## 2021-07-07 NOTE — Telephone Encounter (Signed)
Message sent to pt-[facility] 

## 2021-08-02 ENCOUNTER — Other Ambulatory Visit (INDEPENDENT_AMBULATORY_CARE_PROVIDER_SITE_OTHER): Payer: Self-pay | Admitting: Bariatrics

## 2021-08-02 DIAGNOSIS — I10 Essential (primary) hypertension: Secondary | ICD-10-CM

## 2021-08-02 DIAGNOSIS — E7849 Other hyperlipidemia: Secondary | ICD-10-CM

## 2021-08-04 ENCOUNTER — Encounter (INDEPENDENT_AMBULATORY_CARE_PROVIDER_SITE_OTHER): Payer: Self-pay

## 2021-08-04 NOTE — Telephone Encounter (Signed)
Message sent to pt-[facility] 

## 2021-08-12 ENCOUNTER — Encounter (INDEPENDENT_AMBULATORY_CARE_PROVIDER_SITE_OTHER): Payer: Self-pay | Admitting: Family Medicine

## 2021-08-12 ENCOUNTER — Ambulatory Visit (INDEPENDENT_AMBULATORY_CARE_PROVIDER_SITE_OTHER): Payer: Medicare Other | Admitting: Family Medicine

## 2021-08-12 ENCOUNTER — Other Ambulatory Visit: Payer: Self-pay

## 2021-08-12 VITALS — BP 126/78 | HR 59 | Temp 97.9°F | Ht 63.0 in | Wt 179.0 lb

## 2021-08-12 DIAGNOSIS — Z6838 Body mass index (BMI) 38.0-38.9, adult: Secondary | ICD-10-CM | POA: Diagnosis not present

## 2021-08-12 DIAGNOSIS — E7849 Other hyperlipidemia: Secondary | ICD-10-CM | POA: Diagnosis not present

## 2021-08-12 DIAGNOSIS — I10 Essential (primary) hypertension: Secondary | ICD-10-CM | POA: Diagnosis not present

## 2021-08-12 DIAGNOSIS — F3289 Other specified depressive episodes: Secondary | ICD-10-CM

## 2021-08-12 MED ORDER — HYDROCHLOROTHIAZIDE 12.5 MG PO TABS: 12.5000 mg | ORAL_TABLET | Freq: Every day | ORAL | 0 refills | Status: DC

## 2021-08-12 MED ORDER — BUPROPION HCL ER (SR) 100 MG PO TB12: 100.0000 mg | ORAL_TABLET | Freq: Every day | ORAL | 0 refills | Status: DC

## 2021-08-12 MED ORDER — ATORVASTATIN CALCIUM 20 MG PO TABS: 20.0000 mg | ORAL_TABLET | Freq: Every evening | ORAL | 0 refills | Status: DC

## 2021-08-12 NOTE — Progress Notes (Signed)
Chief Complaint:   OBESITY Kristin Delgado is here to discuss her progress with her obesity treatment plan along with follow-up of her obesity related diagnoses. Kristin Delgado is on keeping a food journal and adhering to recommended goals of 1250 calories and 80+ grams protein and states she is following her eating plan approximately 75% of the time. Kristin Delgado states she is walking 1/2-3/4 mile 4-5 times per week.  Today's visit was #: 68 Starting weight: 189 lbs Starting date: 10/12/2018 Today's weight: 179 lbs Today's date: 08/12/2021 Total lbs lost to date: 10 Total lbs lost since last in-office visit: 2  Interim History: Kristin Delgado voices she is trying to get through the rest of the month. She is working on replacing her bathroom floor and walls of bathroom. She has been journaling (not necessarily writing it down). Her protein intake is about 60-70 grams and calories are about 1,000 per day. She is walking more, as knee is feeling better. Pt reports occasional hunger.  Subjective:   1. Other hyperlipidemia Kristin Delgado is on statin therapy and denies transaminitis. Her last labs revealed an LDL of 94, HDL 41, and triglycerides 118.  2. Essential hypertension Pt denies chest pain/chest pressure/headache. She is prescribed HCTZ and lisinopril.  3. Other depression, with emotional eating Pt denies suicidal or homicidal ideations. She is taking Wellbutrin and Zoloft with some improvement in symptoms.  Assessment/Plan:   1. Other hyperlipidemia Cardiovascular risk and specific lipid/LDL goals reviewed.  We discussed several lifestyle modifications today and Kristin Delgado will continue to work on diet, exercise and weight loss efforts. Orders and follow up as documented in patient record. Continue current treatment plan.  Counseling Intensive lifestyle modifications are the first line treatment for this issue. Dietary changes: Increase soluble fiber. Decrease simple carbohydrates. Exercise changes:  Moderate to vigorous-intensity aerobic activity 150 minutes per week if tolerated. Lipid-lowering medications: see documented in medical record.  Refill- atorvastatin (LIPITOR) 20 MG tablet; Take 1 tablet (20 mg total) by mouth at bedtime.  Dispense: 90 tablet; Refill: 0  2. Essential hypertension Kristin Delgado is working on healthy weight loss and exercise to improve blood pressure control. We will watch for signs of hypotension as she continues her lifestyle modifications. Continue current treatment plan.  Refill- hydrochlorothiazide (HYDRODIURIL) 12.5 MG tablet; Take 1 tablet (12.5 mg total) by mouth daily.  Dispense: 90 tablet; Refill: 0  3. Other depression, with emotional eating Behavior modification techniques were discussed today to help Kristin Delgado deal with her emotional/non-hunger eating behaviors.  Orders and follow up as documented in patient record. Continue Wellbutrin and Zoloft.  Refill- buPROPion ER (WELLBUTRIN SR) 100 MG 12 hr tablet; Take 1 tablet (100 mg total) by mouth daily.  Dispense: 90 tablet; Refill: 0  4. Obesity with current BMI of 31.7  Kristin Delgado is currently in the action stage of change. As such, her goal is to continue with weight loss efforts. She has agreed to keeping a food journal and adhering to recommended goals of 1250 calories and 75-80 grams protein.   Exercise goals:  As is- Add in 15 minutes of resistance training 2-3 times a week.  Behavioral modification strategies: increasing lean protein intake, meal planning and cooking strategies, keeping healthy foods in the home, planning for success, and keeping a strict food journal.  Kristin Delgado has agreed to follow-up with our clinic in 6 weeks. She was informed of the importance of frequent follow-up visits to maximize her success with intensive lifestyle modifications for her multiple health conditions.   Objective:  Blood pressure 126/78, pulse (!) 59, temperature 97.9 F (36.6 C), height 5\' 3"  (1.6 m), weight  179 lb (81.2 kg), last menstrual period 06/10/2011, SpO2 96 %. Body mass index is 31.71 kg/m.  General: Cooperative, alert, well developed, in no acute distress. HEENT: Conjunctivae and lids unremarkable. Cardiovascular: Regular rhythm.  Lungs: Normal work of breathing. Neurologic: No focal deficits.   Lab Results  Component Value Date   CREATININE 0.67 03/07/2021   BUN 12 03/07/2021   NA 142 03/07/2021   K 3.4 (L) 03/07/2021   CL 106 03/07/2021   CO2 26 03/07/2021   Lab Results  Component Value Date   ALT 19 03/07/2021   AST 18 03/07/2021   ALKPHOS 44 03/07/2021   BILITOT 0.7 03/07/2021   Lab Results  Component Value Date   HGBA1C 5.4 11/23/2019   HGBA1C 5.2 06/22/2019   HGBA1C 5.3 01/23/2019   HGBA1C 5.6 10/12/2018   HGBA1C 6.1 01/28/2017   Lab Results  Component Value Date   INSULIN 10.3 11/23/2019   INSULIN 6.7 06/22/2019   INSULIN 6.2 01/23/2019   INSULIN 5.9 10/12/2018   Lab Results  Component Value Date   TSH 2.340 12/01/2019   Lab Results  Component Value Date   CHOL 158 02/18/2021   HDL 41.00 02/18/2021   LDLCALC 94 02/18/2021   LDLDIRECT 83.0 01/28/2017   TRIG 118.0 02/18/2021   CHOLHDL 4 02/18/2021   Lab Results  Component Value Date   VD25OH 86.5 11/23/2019   VD25OH 52.5 01/23/2019   VD25OH 41.1 10/12/2018   Lab Results  Component Value Date   WBC 5.7 02/18/2021   HGB 12.0 02/18/2021   HCT 37.3 02/18/2021   MCV 80.8 02/18/2021   PLT 182.0 02/18/2021   Lab Results  Component Value Date   IRON 52 12/12/2007    Obesity Behavioral Intervention:   Approximately 15 minutes were spent on the discussion below.  ASK: We discussed the diagnosis of obesity with Kristin Delgado today and Kristin Delgado agreed to give [name] permission to discuss obesity behavioral modification therapy today.  ASSESS: Kristin Delgado has the diagnosis of obesity and her BMI today is 31.7. Kristin Delgado is in the action stage of change.   ADVISE: Kristin Delgado was educated on the multiple  health risks of obesity as well as the benefit of weight loss to improve her health. She was advised of the need for long term treatment and the importance of lifestyle modifications to improve her current health and to decrease her risk of future health problems.  AGREE: Multiple dietary modification options and treatment options were discussed and Kristin Delgado agreed to follow the recommendations documented in the above note.  ARRANGE: Kristin Delgado was educated on the importance of frequent visits to treat obesity as outlined per CMS and USPSTF guidelines and agreed to schedule her next follow up appointment today.  Attestation Statements:   Reviewed by clinician on day of visit: allergies, medications, problem list, medical history, surgical history, family history, social history, and previous encounter notes.  Coral Ceo, CMA, am acting as transcriptionist for Coralie Common, MD.   I have reviewed the above documentation for accuracy and completeness, and I agree with the above. - Coralie Common, MD

## 2021-08-21 ENCOUNTER — Other Ambulatory Visit: Payer: Self-pay | Admitting: Family Medicine

## 2021-08-21 ENCOUNTER — Encounter: Payer: Self-pay | Admitting: Family Medicine

## 2021-08-21 ENCOUNTER — Other Ambulatory Visit: Payer: Self-pay

## 2021-08-21 ENCOUNTER — Ambulatory Visit (INDEPENDENT_AMBULATORY_CARE_PROVIDER_SITE_OTHER): Payer: Medicare Other | Admitting: Family Medicine

## 2021-08-21 DIAGNOSIS — Z1211 Encounter for screening for malignant neoplasm of colon: Secondary | ICD-10-CM | POA: Diagnosis not present

## 2021-08-21 DIAGNOSIS — E1169 Type 2 diabetes mellitus with other specified complication: Secondary | ICD-10-CM | POA: Diagnosis not present

## 2021-08-21 DIAGNOSIS — E559 Vitamin D deficiency, unspecified: Secondary | ICD-10-CM | POA: Diagnosis not present

## 2021-08-21 DIAGNOSIS — E1165 Type 2 diabetes mellitus with hyperglycemia: Secondary | ICD-10-CM | POA: Diagnosis not present

## 2021-08-21 DIAGNOSIS — D352 Benign neoplasm of pituitary gland: Secondary | ICD-10-CM

## 2021-08-21 DIAGNOSIS — E785 Hyperlipidemia, unspecified: Secondary | ICD-10-CM

## 2021-08-21 DIAGNOSIS — E2839 Other primary ovarian failure: Secondary | ICD-10-CM | POA: Diagnosis not present

## 2021-08-21 DIAGNOSIS — I10 Essential (primary) hypertension: Secondary | ICD-10-CM | POA: Diagnosis not present

## 2021-08-21 LAB — COMPREHENSIVE METABOLIC PANEL
ALT: 18 U/L (ref 0–35)
AST: 17 U/L (ref 0–37)
Albumin: 4 g/dL (ref 3.5–5.2)
Alkaline Phosphatase: 61 U/L (ref 39–117)
BUN: 19 mg/dL (ref 6–23)
CO2: 31 mEq/L (ref 19–32)
Calcium: 9.5 mg/dL (ref 8.4–10.5)
Chloride: 104 mEq/L (ref 96–112)
Creatinine, Ser: 0.78 mg/dL (ref 0.40–1.20)
GFR: 78.91 mL/min (ref >60.00)
Glucose, Bld: 103 mg/dL — ABNORMAL HIGH (ref 70–99)
Potassium: 4.3 mEq/L (ref 3.5–5.1)
Sodium: 140 mEq/L (ref 135–145)
Total Bilirubin: 0.3 mg/dL (ref 0.2–1.2)
Total Protein: 6.4 g/dL (ref 6.0–8.3)

## 2021-08-21 LAB — HEMOGLOBIN A1C: Hgb A1c MFr Bld: 5.7 % (ref 4.6–6.5)

## 2021-08-21 LAB — LDL CHOLESTEROL, DIRECT: Direct LDL: 113 mg/dL

## 2021-08-21 LAB — LIPID PANEL
Cholesterol: 234 mg/dL — ABNORMAL HIGH (ref 0–200)
HDL: 37 mg/dL — ABNORMAL LOW (ref >39.00)
NonHDL: 196.92
Total CHOL/HDL Ratio: 6
Triglycerides: 371 mg/dL — ABNORMAL HIGH (ref 0.0–149.0)
VLDL: 74.2 mg/dL — ABNORMAL HIGH (ref 0.0–40.0)

## 2021-08-21 LAB — VITAMIN D 25 HYDROXY (VIT D DEFICIENCY, FRACTURES): VITD: 47.07 ng/mL (ref 30.00–100.00)

## 2021-08-21 NOTE — Assessment & Plan Note (Signed)
hgba1c to be checked, minimize simple carbs. Increase exercise as tolerated. Continue current meds  

## 2021-08-21 NOTE — Patient Instructions (Signed)
Carbohydrate Counting for Diabetes Mellitus, Adult Carbohydrate counting is a method of keeping track of how many carbohydrates you eat. Eating carbohydrates naturally increases the amount of sugar (glucose) in the blood. Counting how many carbohydrates you eat improves your blood glucose control, which helps you manage your diabetes. It is important to know how many carbohydrates you can safely have in each meal. This is different for every person. A dietitian can help you make a meal plan and calculate how many carbohydrates you should have at each meal and snack. What foods contain carbohydrates? Carbohydrates are found in the following foods: Grains, such as breads and cereals. Dried beans and soy products. Starchy vegetables, such as potatoes, peas, and corn. Fruit and fruit juices. Milk and yogurt. Sweets and snack foods, such as cake, cookies, candy, chips, and soft drinks. How do I count carbohydrates in foods? There are two ways to count carbohydrates in food. You can read food labels or learn standard serving sizes of foods. You can use either of the methods or a combination of both. Using the Nutrition Facts label The Nutrition Facts list is included on the labels of almost all packaged foods and beverages in the [place] It includes: The serving size. Information about nutrients in each serving, including the grams (g) of carbohydrate per serving. To use the Nutrition Facts: Decide how many servings you will have. Multiply the number of servings by the number of carbohydrates per serving. The resulting number is the total amount of carbohydrates that you will be having. Learning the standard serving sizes of foods When you eat carbohydrate foods that are not packaged or do not include Nutrition Facts on the label, you need to measure the servings in order to count the amount of carbohydrates. Measure the foods that you will eat with a food scale or measuring cup, if needed. Decide how  many standard-size servings you will eat. Multiply the number of servings by 15. For foods that contain carbohydrates, one serving equals 15 g of carbohydrates. For example, if you eat 2 cups or 10 oz (300 g) of strawberries, you will have eaten 2 servings and 30 g of carbohydrates (2 servings x 15 g = 30 g). For foods that have more than one food mixed, such as soups and casseroles, you must count the carbohydrates in each food that is included. The following list contains standard serving sizes of common carbohydrate-rich foods. Each of these servings has about 15 g of carbohydrates: 1 slice of bread. 1 six-inch (15 cm) tortilla. ? cup or 2 oz (53 g) cooked rice or pasta.  cup or 3 oz (85 g) cooked or canned, drained and rinsed beans or lentils.  cup or 3 oz (85 g) starchy vegetable, such as peas, corn, or squash.  cup or 4 oz (120 g) hot cereal.  cup or 3 oz (85 g) boiled or mashed potatoes, or  or 3 oz (85 g) of a large baked potato.  cup or 4 fl oz (118 mL) fruit juice. 1 cup or 8 fl oz (237 mL) milk. 1 small or 4 oz (106 g) apple.  or 2 oz (63 g) of a medium banana. 1 cup or 5 oz (150 g) strawberries. 3 cups or 1 oz (24 g) popped popcorn. What is an example of carbohydrate counting? To calculate the number of carbohydrates in this sample meal, follow the steps shown below. Sample meal 3 oz (85 g) chicken breast. ? cup or 4 oz (106 g) brown   rice.  cup or 3 oz (85 g) corn. 1 cup or 8 fl oz (237 mL) milk. 1 cup or 5 oz (150 g) strawberries with sugar-free whipped topping. Carbohydrate calculation Identify the foods that contain carbohydrates: Rice. Corn. Milk. Strawberries. Calculate how many servings you have of each food: 2 servings rice. 1 serving corn. 1 serving milk. 1 serving strawberries. Multiply each number of servings by 15 g: 2 servings rice x 15 g = 30 g. 1 serving corn x 15 g = 15 g. 1 serving milk x 15 g = 15 g. 1 serving strawberries x 15 g = 15  g. Add together all of the amounts to find the total grams of carbohydrates eaten: 30 g + 15 g + 15 g + 15 g = 75 g of carbohydrates total. What are tips for following this plan? Shopping Develop a meal plan and then make a shopping list. Buy fresh and frozen vegetables, fresh and frozen fruit, dairy, eggs, beans, lentils, and whole grains. Look at food labels. Choose foods that have more fiber and less sugar. Avoid processed foods and foods with added sugars. Meal planning Aim to have the same amount of carbohydrates at each meal and for each snack time. Plan to have regular, balanced meals and snacks. Where to find more information American Diabetes Association: www.diabetes.org Centers for Disease Control and Prevention: www.cdc.gov Summary Carbohydrate counting is a method of keeping track of how many carbohydrates you eat. Eating carbohydrates naturally increases the amount of sugar (glucose) in the blood. Counting how many carbohydrates you eat improves your blood glucose control, which helps you manage your diabetes. A dietitian can help you make a meal plan and calculate how many carbohydrates you should have at each meal and snack. This information is not intended to replace advice given to you by your health care provider. Make sure you discuss any questions you have with your health care provider. Document Revised: 11/02/2019 Document Reviewed: 11/03/2019 Elsevier Patient Education  2021 [employer]  

## 2021-08-21 NOTE — Assessment & Plan Note (Signed)
Well controlled, no changes to meds. Encouraged heart healthy diet such as the DASH diet and exercise as tolerated.  °

## 2021-08-21 NOTE — Assessment & Plan Note (Signed)
Encourage heart healthy diet such as MIND or DASH diet, increase exercise, avoid trans fats, simple carbohydrates and processed foods, consider a krill or fish or flaxseed oil cap daily.  °

## 2021-08-21 NOTE — Progress Notes (Addendum)
Subjective:   By signing my name below, I, Kristin Delgado, attest that this documentation has been prepared under the direction and in the presence of Dr. Roma Schanz, DO. 08/21/2021      Patient ID: Kristin Delgado, female    DOB: 1956/04/19, 65 y.o.   MRN: [id number]  Chief Complaint  Patient presents with   Hypertension   Hyperlipidemia   Diabetes   Follow-up    Hypertension Pertinent negatives include no blurred vision, chest pain, headaches, malaise/fatigue, palpitations or shortness of breath.  Hyperlipidemia Pertinent negatives include no chest pain, myalgias or shortness of breath.  Diabetes Pertinent negatives for hypoglycemia include no dizziness, headaches or nervousness/anxiousness. Pertinent negatives for diabetes include no blurred vision, no chest pain and no weakness.  Patient is in today for a office visit. She complains of stiffness in her left knee. She has mild swelling as well.  She continues complaining of stress. Her stress is from family issues.  She reports that in the spring season of this year she was diagnosed with a blood clot in her lung and was given 20 mg xeralto daily PO and reports no new issues while taking it. She has an upcomming CAT scan next year to follow up on her blood clot.  She continues seeing a healthy weight and wellness program and reports she is struggling to lose weight. She is on a 1250 calorie deficit diet and is making sure to eat enough protein. She notes that she typically eats only 60 g of protein daily and is planning to increase that amount.   Wt Readings from Last 3 Encounters:  08/21/21 185 lb 9.6 oz (84.2 kg)  08/12/21 179 lb (81.2 kg)  07/01/21 181 lb (82.1 kg)   Her blood pressure is doing well during this visit. She continues taking 12.5 mg hydrochlorothiazide daily PO, 10 mg lisinopril daily PO and reports no new issues while taking them.   BP Readings from Last 3 Encounters:  08/21/21 136/82  08/12/21 126/78   07/01/21 (!) 150/90   Pulse Readings from Last 3 Encounters:  08/21/21 (!) 57  08/12/21 (!) 59  07/01/21 63   She does not consistently check her blood sugars at home. She continues taking 500 mg metformin daily PO and she thinks that the medication is not effective in managing her appetite.   Lab Results  Component Value Date   HGBA1C 5.4 11/23/2019   She is not interested in receiving a flu vaccine. She reports receiving the new shingles vaccine at her pharmacy. She is due for a tetanus vaccine and is willing to get it at her pharmacy.  She is due for a colonoscopy by 1 month and reports not receiving a letter informing her.  She is due for a mammogram and is not interested in getting one. She is due for a bone density scan and is interested in setting up an appointment to get it completed.    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   BRAIN SURGERY  1 / 1985   microadenoma   COCHLEAR IMPLANT  02/2013   Left side    Family History  Problem Relation Age of Onset   Lung cancer Mother    Cancer Mother 80       lung    Alzheimer's disease Father    Dementia Father     Social History   Socioeconomic History   Marital status: Married    Spouse name: Annie Main   Number of children: 3   Years of education: Not on file   Highest education level: Not on file  Occupational History   Occupation: Baker/Cook  Tobacco Use   Smoking status: Never   Smokeless tobacco: Never  Vaping Use   Vaping Use: Never  used  Substance and Sexual Activity   Alcohol use: No    Comment: rare   Drug use: No   Sexual activity: Not Currently    Partners: Male  Other Topics Concern   Not on file  Social History Narrative   Exercise-- daily --- walking, 3x a week DVD   Social Determinants of Health   Financial Resource Strain: Low Risk    Difficulty of Paying Living Expenses: Not hard at all  Food Insecurity: No Food Insecurity   Worried About Charity fundraiser in the Last Year: Never true   [place] in the Last Year: Never true  Transportation Needs: No Transportation Needs   Lack of Transportation (Medical): No   Lack of Transportation (Non-Medical): No  Physical Activity: Sufficiently Active   Days of Exercise per Week: 7 days   Minutes of Exercise per Session: 30 min  Stress: No Stress Concern Present   Feeling of Stress : Only a little  Social Connections: Moderately Isolated   Frequency of Communication with Friends and Family: More than three times a week   Frequency of Social Gatherings with Friends and Family: Never   Attends Religious Services: Never   Marine scientist or Organizations: No   Attends Music therapist: Never   Marital Status: Married  Human resources officer Violence: Not At Risk   Fear of Current or Ex-Partner: No   Emotionally Abused: No   Physically Abused: No   Sexually Abused: No    Outpatient Medications Prior to Visit  Medication Sig Dispense Refill   acetaminophen (TYLENOL) 500 MG tablet Take 500-1,000 mg by mouth every 6 (six) hours as needed for headache (pain).     Ascorbic Acid (VITAMIN C PO) Take 1 tablet by mouth 2 (two) times daily.     atorvastatin (LIPITOR) 20 MG tablet Take 1 tablet (20 mg total) by mouth at bedtime. 90 tablet 0   B Complex-C (SUPER B COMPLEX PO) Take 1 tablet by mouth daily with lunch.     buPROPion ER (WELLBUTRIN SR) 100 MG 12 hr tablet Take 1 tablet (100 mg total) by mouth daily. 90 tablet 0   CALCIUM PO Take  1 tablet by mouth at bedtime.     ferrous sulfate 325 (65 FE) MG EC tablet Take 325 mg by mouth daily with lunch.     hydrochlorothiazide (HYDRODIURIL) 12.5 MG tablet Take 1 tablet (12.5 mg total) by mouth daily. 90 tablet 0   lisinopril (ZESTRIL) 10 MG tablet Take 1 tablet (10 mg total) by mouth at bedtime. 90 tablet 0   metFORMIN (GLUCOPHAGE)  500 MG tablet Take 1 tablet (500 mg total) by mouth at bedtime. 90 tablet 0   Multiple Vitamin (MULTIVITAMIN WITH MINERALS) TABS tablet Take 1 tablet by mouth daily with lunch.     Multiple Vitamins-Minerals (ZINC PO) Take 1 tablet by mouth at bedtime.     Omega-3 Fatty Acids (FISH OIL PO) Take 1 capsule by mouth 2 (two) times daily.     POTASSIUM PO Take 1 tablet by mouth at bedtime.     rivaroxaban (XARELTO) 20 MG TABS tablet TAKE 1 TABLET BY MOUTH DAILY WITH SUPPER. 90 tablet 1   sertraline (ZOLOFT) 50 MG tablet Take 0.5 tablets (25 mg total) by mouth daily. 90 tablet 0   tiZANidine (ZANAFLEX) 4 MG tablet Take 0.5-1 tablets (2-4 mg total) by mouth every 6 (six) hours as needed for muscle spasms. 30 tablet 0   VITAMIN D PO Take 1 tablet by mouth daily.     Wheat Dextrin (BENEFIBER) POWD Take 15 mLs by mouth 2 (two) times daily.     No facility-administered medications prior to visit.    No Known Allergies  Review of Systems  Constitutional:  Negative for chills, fever and malaise/fatigue.  HENT:  Negative for congestion and hearing loss.   Eyes:  Negative for blurred vision and discharge.  Respiratory:  Negative for cough, sputum production and shortness of breath.   Cardiovascular:  Negative for chest pain, palpitations and leg swelling.  Gastrointestinal:  Negative for abdominal pain, blood in stool, constipation, diarrhea, heartburn, nausea and vomiting.  Genitourinary:  Negative for dysuria, frequency, hematuria and urgency.  Musculoskeletal:  Negative for back pain, falls and myalgias.       (+)stiffness and mild swelling in left knee   Skin:  Negative for rash.  Neurological:  Negative for dizziness, sensory change, loss of consciousness, weakness and headaches.  Endo/Heme/Allergies:  Negative for environmental allergies. Does not bruise/bleed easily.  Psychiatric/Behavioral:  Negative for depression and suicidal ideas. The patient is not nervous/anxious and does not have insomnia.        (+)stress      Objective:    Physical Exam Vitals and nursing note reviewed.  Constitutional:      General: She is not in acute distress.    Appearance: Normal appearance. She is not ill-appearing.  HENT:     Head: Normocephalic and atraumatic.     Right Ear: External ear normal.     Left Ear: External ear normal.  Eyes:     Extraocular Movements: Extraocular movements intact.     Pupils: Pupils are equal, round, and reactive to light.  Cardiovascular:     Rate and Rhythm: Normal rate and regular rhythm.     Heart sounds: Normal heart sounds. No murmur heard.   No gallop.  Pulmonary:     Effort: Pulmonary effort is normal. No respiratory distress.     Breath sounds: Normal breath sounds. No wheezing or rales.  Feet:     Comments: Diabetic Foot Exam - Simple   Simple Foot Form Diabetic Foot exam was performed with the following findings: Yes  08/21/2021  8:50 AM  Visual Inspection No deformities, no ulcerations, no other skin breakdown bilaterally: Yes Sensation Testing Intact to touch and monofilament testing bilaterally: Yes Pulse Check Posterior Tibialis and Dorsalis pulse intact bilaterally: Yes Comments    Skin:    General: Skin is warm and dry.  Neurological:     Mental Status: She is alert and oriented to person, place, and  time.  Psychiatric:        Behavior: Behavior normal.        Judgment: Judgment normal.    BP 136/82 (BP Location: Left Arm, Patient Position: Sitting, Cuff Size: Large)   Pulse (!) 57   Temp 98.2 F (36.8 C) (Oral)   Resp 18   Ht 5\' 3"  (1.6 m)   Wt 185 lb 9.6 oz (84.2 kg)   LMP  06/10/2011   SpO2 97%   BMI 32.88 kg/m  Wt Readings from Last 3 Encounters:  08/21/21 185 lb 9.6 oz (84.2 kg)  08/12/21 179 lb (81.2 kg)  07/01/21 181 lb (82.1 kg)    Diabetic Foot Exam - Simple   Simple Foot Form Diabetic Foot exam was performed with the following findings: Yes 08/21/2021  8:50 AM  Visual Inspection No deformities, no ulcerations, no other skin breakdown bilaterally: Yes Sensation Testing Intact to touch and monofilament testing bilaterally: Yes Pulse Check Posterior Tibialis and Dorsalis pulse intact bilaterally: Yes Comments    Lab Results  Component Value Date   WBC 5.7 02/18/2021   HGB 12.0 02/18/2021   HCT 37.3 02/18/2021   PLT 182.0 02/18/2021   GLUCOSE 95 03/07/2021   CHOL 158 02/18/2021   TRIG 118.0 02/18/2021   HDL 41.00 02/18/2021   LDLDIRECT 83.0 01/28/2017   LDLCALC 94 02/18/2021   ALT 19 03/07/2021   AST 18 03/07/2021   NA 142 03/07/2021   K 3.4 (L) 03/07/2021   CL 106 03/07/2021   CREATININE 0.67 03/07/2021   BUN 12 03/07/2021   CO2 26 03/07/2021   TSH 2.340 12/01/2019   HGBA1C 5.4 11/23/2019    Lab Results  Component Value Date   TSH 2.340 12/01/2019   Lab Results  Component Value Date   WBC 5.7 02/18/2021   HGB 12.0 02/18/2021   HCT 37.3 02/18/2021   MCV 80.8 02/18/2021   PLT 182.0 02/18/2021   Lab Results  Component Value Date   NA 142 03/07/2021   K 3.4 (L) 03/07/2021   CO2 26 03/07/2021   GLUCOSE 95 03/07/2021   BUN 12 03/07/2021   CREATININE 0.67 03/07/2021   BILITOT 0.7 03/07/2021   ALKPHOS 44 03/07/2021   AST 18 03/07/2021   ALT 19 03/07/2021   PROT 6.2 (L) 03/07/2021   ALBUMIN 3.5 03/07/2021   CALCIUM 9.2 03/07/2021   ANIONGAP 10 03/07/2021   GFR 92.14 02/18/2021   Lab Results  Component Value Date   CHOL 158 02/18/2021   Lab Results  Component Value Date   HDL 41.00 02/18/2021   Lab Results  Component Value Date   LDLCALC 94 02/18/2021   Lab Results  Component Value Date   TRIG 118.0  02/18/2021   Lab Results  Component Value Date   CHOLHDL 4 02/18/2021   Lab Results  Component Value Date   HGBA1C 5.4 11/23/2019       Assessment & Plan:   Problem List Items Addressed This Visit       Unprioritized   Vitamin D deficiency   Relevant Orders   VITAMIN D 25 Hydroxy (Vit-D Deficiency, Fractures)   Pituitary adenoma ([place])   Relevant Orders   Prolactin   Essential hypertension    Well controlled, no changes to meds. Encouraged heart healthy diet such as the DASH diet and exercise as tolerated.       Hyperlipidemia    Encourage heart healthy diet such as MIND or DASH diet, increase exercise, avoid trans fats, simple  carbohydrates and processed foods, consider a krill or fish or flaxseed oil cap daily.       Type 2 diabetes mellitus with hyperglycemia, without long-term current use of insulin (HCC)    hgba1c to be checked, minimize simple carbs. Increase exercise as tolerated. Continue current meds       Relevant Orders   Hemoglobin A1c   Microalbumin, urine   Comprehensive metabolic panel   Lipid panel   Insulin, random   VITAMIN D 25 Hydroxy (Vit-D Deficiency, Fractures)   Other Visit Diagnoses     Morbid obesity ([place])    -  Primary   Relevant Orders   Hemoglobin A1c   Microalbumin, urine   Comprehensive metabolic panel   Lipid panel   Insulin, random   VITAMIN D 25 Hydroxy (Vit-D Deficiency, Fractures)   Primary hypertension       Relevant Orders   Hemoglobin A1c   Microalbumin, urine   Comprehensive metabolic panel   Lipid panel   Insulin, random   VITAMIN D 25 Hydroxy (Vit-D Deficiency, Fractures)   Hyperlipidemia associated with type 2 diabetes mellitus (HCC)       Relevant Orders   Hemoglobin A1c   Microalbumin, urine   Comprehensive metabolic panel   Lipid panel   Insulin, random   VITAMIN D 25 Hydroxy (Vit-D Deficiency, Fractures)   Colon cancer screening       Relevant Orders   Ambulatory referral to Gastroenterology    Estrogen deficiency       Relevant Orders   DG Bone Density        No orders of the defined types were placed in this encounter.   I, Dr. Roma Schanz, DO, personally preformed the services described in this documentation.  All medical record entries made by the scribe were at my direction and in my presence.  I have reviewed the chart and discharge instructions (if applicable) and agree that the record reflects my personal performance and is accurate and complete. 08/21/2021   I,Kristin Delgado,acting as a scribe for [name], DO.,have documented all relevant documentation on the behalf of [name], DO,as directed by  [name], DO while in the presence of [name], DO.   [name], DO

## 2021-08-22 LAB — MICROALBUMIN, URINE: Microalb, Ur: 0.8 mg/dL

## 2021-08-22 LAB — EXTRA SPECIMEN

## 2021-08-22 LAB — INSULIN, RANDOM: Insulin: 9.8 u[IU]/mL

## 2021-08-22 LAB — PROLACTIN: Prolactin: 11.6 ng/mL

## 2021-08-26 ENCOUNTER — Other Ambulatory Visit (HOSPITAL_BASED_OUTPATIENT_CLINIC_OR_DEPARTMENT_OTHER): Payer: Medicare Other

## 2021-08-26 ENCOUNTER — Encounter: Payer: Self-pay | Admitting: Gastroenterology

## 2021-09-23 ENCOUNTER — Encounter (INDEPENDENT_AMBULATORY_CARE_PROVIDER_SITE_OTHER): Payer: Self-pay | Admitting: Family Medicine

## 2021-09-23 ENCOUNTER — Other Ambulatory Visit: Payer: Self-pay

## 2021-09-23 ENCOUNTER — Ambulatory Visit (INDEPENDENT_AMBULATORY_CARE_PROVIDER_SITE_OTHER): Payer: Medicare Other | Admitting: Family Medicine

## 2021-09-23 VITALS — BP 147/85 | HR 61 | Temp 97.4°F | Ht 63.0 in | Wt 179.0 lb

## 2021-09-23 DIAGNOSIS — R7303 Prediabetes: Secondary | ICD-10-CM

## 2021-09-23 DIAGNOSIS — I10 Essential (primary) hypertension: Secondary | ICD-10-CM

## 2021-09-23 DIAGNOSIS — Z6838 Body mass index (BMI) 38.0-38.9, adult: Secondary | ICD-10-CM

## 2021-09-23 MED ORDER — METFORMIN HCL 500 MG PO TABS: 500.0000 mg | ORAL_TABLET | Freq: Every day | ORAL | 0 refills | Status: DC

## 2021-09-23 MED ORDER — LISINOPRIL 10 MG PO TABS: 10.0000 mg | ORAL_TABLET | Freq: Every day | ORAL | 0 refills | Status: DC

## 2021-09-23 NOTE — Progress Notes (Signed)
Chief Complaint:   OBESITY Kristin Delgado is here to discuss her progress with her obesity treatment plan along with follow-up of her obesity related diagnoses. Kristin Delgado is on keeping a food journal and adhering to recommended goals of 1250 calories and 75-80 grams protein and states she is following her eating plan approximately 50% of the time. Kristin Delgado states she is walking the dog and doing housework 15 minutes 3-4 times per week.  Today's visit was #: 67 Starting weight: 189 lbs Starting date: 10/12/2018 Today's weight: 179 lbs Today's date: 09/23/2021 Total lbs lost to date: 10 Total lbs lost since last in-office visit: 0  Interim History: Kristin Delgado has had a rough few weeks. She is still working with insurance to get paid for recent water leak. She is experiencing strife with daughter still. Pt is emotionally drained from constant arguments. She recognizes she hasn't been able to get all nutrition in and is not sure how many calories she is getting in. Often times, pt is using tea and broth to suffice for a meal. She wants to start eating in the day. She is staying home for Thanksgiving.  Subjective:   1. Essential hypertension BP slightly elevated today. She has some familial stress. Pt denies chest pain/chest pressure/headache.  2. Pre-diabetes Kristin Delgado has an A1c of 5.7 and insulin level of 9.8. She is on Glucophage.  Assessment/Plan:   1. Essential hypertension Kristin Delgado is working on healthy weight loss and exercise to improve blood pressure control. We will watch for signs of hypotension as she continues her lifestyle modifications.  Refill- lisinopril (ZESTRIL) 10 MG tablet; Take 1 tablet (10 mg total) by mouth at bedtime.  Dispense: 90 tablet; Refill: 0  2. Pre-diabetes Kristin Delgado will continue to work on weight loss, exercise, and decreasing simple carbohydrates to help decrease the risk of diabetes.   Refill- metFORMIN (GLUCOPHAGE) 500 MG tablet; Take 1 tablet (500 mg total)  by mouth at bedtime.  Dispense: 90 tablet; Refill: 0  3. Obesity with current BMI of 31.8  Kristin Delgado is currently in the action stage of change. As such, her goal is to continue with weight loss efforts. She has agreed to keeping a food journal and adhering to recommended goals of 1250 calories and 75-80+ grams protein.   Exercise goals:  As is  Behavioral modification strategies: increasing lean protein intake, meal planning and cooking strategies, keeping healthy foods in the home, and planning for success.  Kristin Delgado has agreed to follow-up with our clinic in 8 weeks. She was informed of the importance of frequent follow-up visits to maximize her success with intensive lifestyle modifications for her multiple health conditions.   Objective:   Blood pressure (!) 147/85, pulse 61, temperature (!) 97.4 F (36.3 C), height 5\' 3"  (1.6 m), weight 179 lb (81.2 kg), last menstrual period 06/10/2011, SpO2 98 %. Body mass index is 31.71 kg/m.  General: Cooperative, alert, well developed, in no acute distress. HEENT: Conjunctivae and lids unremarkable. Cardiovascular: Regular rhythm.  Lungs: Normal work of breathing. Neurologic: No focal deficits.   Lab Results  Component Value Date   CREATININE 0.78 08/21/2021   BUN 19 08/21/2021   NA 140 08/21/2021   K 4.3 08/21/2021   CL 104 08/21/2021   CO2 31 08/21/2021   Lab Results  Component Value Date   ALT 18 08/21/2021   AST 17 08/21/2021   ALKPHOS 61 08/21/2021   BILITOT 0.3 08/21/2021   Lab Results  Component Value Date   HGBA1C 5.7  08/21/2021   HGBA1C 5.4 11/23/2019   HGBA1C 5.2 06/22/2019   HGBA1C 5.3 01/23/2019   HGBA1C 5.6 10/12/2018   Lab Results  Component Value Date   INSULIN 10.3 11/23/2019   INSULIN 6.7 06/22/2019   INSULIN 6.2 01/23/2019   INSULIN 5.9 10/12/2018   Lab Results  Component Value Date   TSH 2.340 12/01/2019   Lab Results  Component Value Date   CHOL 234 (H) 08/21/2021   HDL 37.00 (L) 08/21/2021    LDLCALC 94 02/18/2021   LDLDIRECT 113.0 08/21/2021   TRIG 371.0 (H) 08/21/2021   CHOLHDL 6 08/21/2021   Lab Results  Component Value Date   VD25OH 47.07 08/21/2021   VD25OH 86.5 11/23/2019   VD25OH 52.5 01/23/2019   Lab Results  Component Value Date   WBC 5.7 02/18/2021   HGB 12.0 02/18/2021   HCT 37.3 02/18/2021   MCV 80.8 02/18/2021   PLT 182.0 02/18/2021   Lab Results  Component Value Date   IRON 52 12/12/2007    Attestation Statements:   Reviewed by clinician on day of visit: allergies, medications, problem list, medical history, surgical history, family history, social history, and previous encounter notes.  Coral Ceo, CMA, am acting as transcriptionist for Coralie Common, MD.   I have reviewed the above documentation for accuracy and completeness, and I agree with the above. - Coralie Common, MD

## 2021-09-29 ENCOUNTER — Other Ambulatory Visit (HOSPITAL_BASED_OUTPATIENT_CLINIC_OR_DEPARTMENT_OTHER): Payer: Medicare Other

## 2021-10-23 ENCOUNTER — Telehealth (HOSPITAL_BASED_OUTPATIENT_CLINIC_OR_DEPARTMENT_OTHER): Payer: Self-pay

## 2021-11-04 ENCOUNTER — Telehealth (HOSPITAL_BASED_OUTPATIENT_CLINIC_OR_DEPARTMENT_OTHER): Payer: Self-pay

## 2021-11-11 ENCOUNTER — Other Ambulatory Visit (INDEPENDENT_AMBULATORY_CARE_PROVIDER_SITE_OTHER): Payer: Self-pay | Admitting: Family Medicine

## 2021-11-11 ENCOUNTER — Encounter (INDEPENDENT_AMBULATORY_CARE_PROVIDER_SITE_OTHER): Payer: Self-pay

## 2021-11-11 DIAGNOSIS — E7849 Other hyperlipidemia: Secondary | ICD-10-CM

## 2021-11-11 DIAGNOSIS — I10 Essential (primary) hypertension: Secondary | ICD-10-CM

## 2021-11-11 NOTE — Telephone Encounter (Signed)
Msg sent to pt 

## 2021-11-18 ENCOUNTER — Ambulatory Visit (INDEPENDENT_AMBULATORY_CARE_PROVIDER_SITE_OTHER): Payer: Medicare Other | Admitting: Family Medicine

## 2021-11-18 ENCOUNTER — Encounter (INDEPENDENT_AMBULATORY_CARE_PROVIDER_SITE_OTHER): Payer: Self-pay | Admitting: Family Medicine

## 2021-11-18 ENCOUNTER — Other Ambulatory Visit: Payer: Self-pay

## 2021-11-18 VITALS — BP 146/84 | HR 67 | Temp 98.4°F | Ht 63.0 in | Wt 180.0 lb

## 2021-11-18 DIAGNOSIS — E7849 Other hyperlipidemia: Secondary | ICD-10-CM

## 2021-11-18 DIAGNOSIS — F3289 Other specified depressive episodes: Secondary | ICD-10-CM

## 2021-11-18 DIAGNOSIS — I10 Essential (primary) hypertension: Secondary | ICD-10-CM

## 2021-11-18 DIAGNOSIS — Z6838 Body mass index (BMI) 38.0-38.9, adult: Secondary | ICD-10-CM

## 2021-11-18 MED ORDER — BUPROPION HCL ER (SR) 100 MG PO TB12: 100.0000 mg | ORAL_TABLET | Freq: Every day | ORAL | 0 refills | Status: DC

## 2021-11-18 MED ORDER — HYDROCHLOROTHIAZIDE 12.5 MG PO TABS: 12.5000 mg | ORAL_TABLET | Freq: Every day | ORAL | 0 refills | Status: DC

## 2021-11-18 MED ORDER — ATORVASTATIN CALCIUM 20 MG PO TABS: 20.0000 mg | ORAL_TABLET | Freq: Every evening | ORAL | 0 refills | Status: DC

## 2021-11-18 NOTE — Progress Notes (Signed)
Chief Complaint:   OBESITY Kristin Delgado is here to discuss her progress with her obesity treatment plan along with follow-up of her obesity related diagnoses. Kristin Delgado is on keeping a food journal and adhering to recommended goals of 1250 calories and 75-80 protein and states she is following her eating plan approximately 50% of the time. Kristin Delgado states she is walking 15 minutes 7 times per week.  Today's visit was #: 43 Starting weight: 189 lbs Starting date: 10/12/2018 Today's weight: 180 lbs Today's date: 11/18/2021 Total lbs lost to date: 9 Total lbs lost since last in-office visit: 0  Interim History: Pt had a quiet holiday- ate and cooked. She is hoping to get a thorough house cleaning done for January. She has struggled with journaling since her last appt. Pt has not been writing food down. She wants to get outside more over the next few weeks. Pt realizes she needs to get back on writing food down.  Subjective:   1. Other hyperlipidemia Kristin Delgado denies transaminitis or myalgias. She is on Lipitor.  2. Essential hypertension Pt denies chest pain/chest pressure/headache. She is on HCTZ and lisinopril.  3. Other depression Pt is in on Wellbutrin 100 mg daily. Pt denies suicidal or homicidal ideations.  Assessment/Plan:   1. Other hyperlipidemia Cardiovascular risk and specific lipid/LDL goals reviewed.  We discussed several lifestyle modifications today and Kristin Delgado will continue to work on diet, exercise and weight loss efforts. Orders and follow up as documented in patient record.   Counseling Intensive lifestyle modifications are the first line treatment for this issue. Dietary changes: Increase soluble fiber. Decrease simple carbohydrates. Exercise changes: Moderate to vigorous-intensity aerobic activity 150 minutes per week if tolerated. Lipid-lowering medications: see documented in medical record.  Refill- atorvastatin (LIPITOR) 20 MG tablet; Take 1 tablet (20 mg total)  by mouth at bedtime.  Dispense: 90 tablet; Refill: 0  2. Essential hypertension Kristin Delgado is working on healthy weight loss and exercise to improve blood pressure control. We will watch for signs of hypotension as she continues her lifestyle modifications. Continue HCTZ and lisinopril.  Refill- hydrochlorothiazide (HYDRODIURIL) 12.5 MG tablet; Take 1 tablet (12.5 mg total) by mouth daily.  Dispense: 90 tablet; Refill: 0  3. Other depression Behavior modification techniques were discussed today to help Kristin Delgado deal with her emotional/non-hunger eating behaviors.  Orders and follow up as documented in patient record.   Refill- buPROPion ER (WELLBUTRIN SR) 100 MG 12 hr tablet; Take 1 tablet (100 mg total) by mouth daily.  Dispense: 90 tablet; Refill: 0  4. Obesity with current BMI of 31.8  Kristin Delgado is currently in the action stage of change. As such, her goal is to continue with weight loss efforts. She has agreed to keeping a food journal and adhering to recommended goals of 1250 calories and 75-80+ grams protein.   Exercise goals: All adults should avoid inactivity. Some physical activity is better than none, and adults who participate in any amount of physical activity gain some health benefits.  Behavioral modification strategies: increasing lean protein intake, meal planning and cooking strategies, emotional eating strategies, planning for success, and keeping a strict food journal.  Kristin Delgado has agreed to follow-up with our clinic in 12-16 weeks. She was informed of the importance of frequent follow-up visits to maximize her success with intensive lifestyle modifications for her multiple health conditions.   Objective:   Blood pressure (!) 146/84, pulse 67, temperature 98.4 F (36.9 C), height 5\' 3"  (1.6 m), weight 180 lb (81.6  kg), last menstrual period 06/10/2011, SpO2 96 %. Body mass index is 31.89 kg/m.  General: Cooperative, alert, well developed, in no acute distress. HEENT:  Conjunctivae and lids unremarkable. Cardiovascular: Regular rhythm.  Lungs: Normal work of breathing. Neurologic: No focal deficits.   Lab Results  Component Value Date   CREATININE 0.78 08/21/2021   BUN 19 08/21/2021   NA 140 08/21/2021   K 4.3 08/21/2021   CL 104 08/21/2021   CO2 31 08/21/2021   Lab Results  Component Value Date   ALT 18 08/21/2021   AST 17 08/21/2021   ALKPHOS 61 08/21/2021   BILITOT 0.3 08/21/2021   Lab Results  Component Value Date   HGBA1C 5.7 08/21/2021   HGBA1C 5.4 11/23/2019   HGBA1C 5.2 06/22/2019   HGBA1C 5.3 01/23/2019   HGBA1C 5.6 10/12/2018   Lab Results  Component Value Date   INSULIN 10.3 11/23/2019   INSULIN 6.7 06/22/2019   INSULIN 6.2 01/23/2019   INSULIN 5.9 10/12/2018   Lab Results  Component Value Date   TSH 2.340 12/01/2019   Lab Results  Component Value Date   CHOL 234 (H) 08/21/2021   HDL 37.00 (L) 08/21/2021   LDLCALC 94 02/18/2021   LDLDIRECT 113.0 08/21/2021   TRIG 371.0 (H) 08/21/2021   CHOLHDL 6 08/21/2021   Lab Results  Component Value Date   VD25OH 47.07 08/21/2021   VD25OH 86.5 11/23/2019   VD25OH 52.5 01/23/2019   Lab Results  Component Value Date   WBC 5.7 02/18/2021   HGB 12.0 02/18/2021   HCT 37.3 02/18/2021   MCV 80.8 02/18/2021   PLT 182.0 02/18/2021   Lab Results  Component Value Date   IRON 52 12/12/2007    Obesity Behavioral Intervention:   Approximately 15 minutes were spent on the discussion below.  ASK: We discussed the diagnosis of obesity with Kristin Delgado today and Kristin Delgado agreed to give [name] permission to discuss obesity behavioral modification therapy today.  ASSESS: Kristin Delgado has the diagnosis of obesity and her BMI today is 32.0. Kristin Delgado is in the action stage of change.   ADVISE: Kristin Delgado was educated on the multiple health risks of obesity as well as the benefit of weight loss to improve her health. She was advised of the need for long term treatment and the importance of  lifestyle modifications to improve her current health and to decrease her risk of future health problems.  AGREE: Multiple dietary modification options and treatment options were discussed and Kristin Delgado agreed to follow the recommendations documented in the above note.  ARRANGE: Kristin Delgado was educated on the importance of frequent visits to treat obesity as outlined per CMS and USPSTF guidelines and agreed to schedule her next follow up appointment today.  Attestation Statements:   Reviewed by clinician on day of visit: allergies, medications, problem list, medical history, surgical history, family history, social history, and previous encounter notes.  Coral Ceo, CMA, am acting as transcriptionist for Coralie Common, MD.  I have reviewed the above documentation for accuracy and completeness, and I agree with the above. - Coralie Common, MD

## 2021-12-19 NOTE — Progress Notes (Signed)
Subjective:   Kristin Delgado is a 66 y.o. female who presents for Medicare Annual (Subsequent) preventive examination.  I connected with Symphonie today by telephone and verified that I am speaking with the correct person using two identifiers. Location patient: home Location provider: work Persons participating in the virtual visit: patient, Marine scientist.    I discussed the limitations, risks, security and privacy concerns of performing an evaluation and management service by telephone and the availability of in person appointments. I also discussed with the patient that there may be a patient responsible charge related to this service. The patient expressed understanding and verbally consented to this telephonic visit.    Interactive audio and video telecommunications were attempted between this provider and patient, however failed, due to patient having technical difficulties OR patient did not have access to video capability.  We continued and completed visit with audio only.  Some vital signs may be absent or patient reported.   Time Spent with patient on telephone encounter: 25 minutes   Review of Systems     Cardiac Risk Factors include: advanced age (>32men, >61 women);dyslipidemia;diabetes mellitus;hypertension;obesity (BMI >30kg/m2);sedentary lifestyle     Objective:    Today's Vitals   12/22/21 0948  Weight: 180 lb (81.6 kg)  Height: 5\' 3"  (1.6 m)   Body mass index is 31.89 kg/m.  Advanced Directives 12/22/2021 10/03/2020 08/08/2019 01/24/2018  Does Patient Have a Medical Advance Directive? No No No No  Would patient like information on creating a medical advance directive? No - Patient declined Yes (MAU/Ambulatory/Procedural Areas - Information given) No - Patient declined Yes (MAU/Ambulatory/Procedural Areas - Information given)    Current Medications (verified) Outpatient Encounter Medications as of 12/22/2021  Medication Sig   acetaminophen (TYLENOL) 500 MG tablet Take  500-1,000 mg by mouth every 6 (six) hours as needed for headache (pain).   Ascorbic Acid (VITAMIN C PO) Take 1 tablet by mouth 2 (two) times daily.   atorvastatin (LIPITOR) 20 MG tablet Take 1 tablet (20 mg total) by mouth at bedtime.   B Complex-C (SUPER B COMPLEX PO) Take 1 tablet by mouth daily with lunch.   buPROPion ER (WELLBUTRIN SR) 100 MG 12 hr tablet Take 1 tablet (100 mg total) by mouth daily.   CALCIUM PO Take 1 tablet by mouth at bedtime.   ferrous sulfate 325 (65 FE) MG EC tablet Take 325 mg by mouth daily with lunch.   hydrochlorothiazide (HYDRODIURIL) 12.5 MG tablet Take 1 tablet (12.5 mg total) by mouth daily.   lisinopril (ZESTRIL) 10 MG tablet Take 1 tablet (10 mg total) by mouth at bedtime.   metFORMIN (GLUCOPHAGE) 500 MG tablet Take 1 tablet (500 mg total) by mouth at bedtime.   Multiple Vitamin (MULTIVITAMIN WITH MINERALS) TABS tablet Take 1 tablet by mouth daily with lunch.   Multiple Vitamins-Minerals (ZINC PO) Take 1 tablet by mouth at bedtime.   Omega-3 Fatty Acids (FISH OIL PO) Take 1 capsule by mouth 2 (two) times daily.   POTASSIUM PO Take 1 tablet by mouth at bedtime.   rivaroxaban (XARELTO) 20 MG TABS tablet TAKE 1 TABLET BY MOUTH DAILY WITH SUPPER.   sertraline (ZOLOFT) 50 MG tablet Take 0.5 tablets (25 mg total) by mouth daily.   tiZANidine (ZANAFLEX) 4 MG tablet Take 0.5-1 tablets (2-4 mg total) by mouth every 6 (six) hours as needed for muscle spasms.   VITAMIN D PO Take 1 tablet by mouth daily.   Wheat Dextrin (BENEFIBER) POWD Take 15 mLs by mouth  2 (two) times daily.   No facility-administered encounter medications on file as of 12/22/2021.    Allergies (verified) Patient has no known allergies.   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   BRAIN SURGERY  1 / 1985   microadenoma   COCHLEAR IMPLANT  02/2013   Left side   Family History  Problem Relation Age of Onset   Lung cancer Mother    Cancer Mother 35       lung    Alzheimer's disease Father    Dementia Father    Social History   Socioeconomic History   Marital status: Married    Spouse name: Annie Main   Number of children: 3   Years of education: Not on file   Highest education level: Not on file  Occupational History   Occupation: Baker/Cook  Tobacco Use   Smoking status: Never   Smokeless tobacco: Never  Vaping Use   Vaping Use: Never used  Substance and Sexual Activity   Alcohol use: No    Comment: rare   Drug use: No   Sexual activity: Not Currently    Partners: Male  Other Topics Concern   Not on file  Social History Narrative   Exercise-- daily --- walking, 3x a week DVD   Social Determinants of Health   Financial Resource Strain: Low Risk    Difficulty of Paying Living Expenses: Not hard at all  Food Insecurity: No Food Insecurity   Worried About Charity fundraiser in the Last Year: Never true   Ran Out of Food in the Last Year: Never true  Transportation Needs: No Transportation Needs   Lack of Transportation (Medical): No   Lack of Transportation (Non-Medical): No  Physical Activity: Inactive   Days of Exercise per Week: 0 days   Minutes of Exercise per Session: 0 min  Stress: No Stress Concern Present   Feeling of Stress : Only a little  Social Connections: Moderately Integrated   Frequency of Communication with Friends and Family: Twice a week   Frequency of Social Gatherings with Friends and Family: Once a week   Attends Religious Services: 1 to 4  times per year   Active Member of [employer] or Organizations: No   Attends Music therapist: Never   Marital Status: Married    Tobacco Counseling Counseling given: Not Answered   Clinical Intake:  Pre-visit preparation completed: Yes  Pain : No/denies pain     BMI - recorded: 31.89 Nutritional Status: BMI > 30  Obese Nutritional Risks: None Diabetes: Yes CBG done?: No Did pt. bring in CBG monitor from home?: No  How often do you need to have someone help you when you read instructions, pamphlets, or other written materials from your doctor or pharmacy?: 1 - Never Diabetes:  Is the patient diabetic?  Yes  If diabetic, was a  CBG obtained today?  No  Did the patient bring in their glucometer from home?  No phone visit How often do you monitor your CBG's? never.   Financial Strains and Diabetes Management:  Are you having any financial strains with the device, your supplies or your medication? No .  Does the patient want to be seen by Chronic Care Management for management of their diabetes?  No  Would the patient like to be referred to a Nutritionist or for Diabetic Management?  No   Diabetic Exams:  Diabetic Eye Exam: . Overdue for diabetic eye exam. Pt has been advised about the importance in completing this exam.   Diabetic Foot Exam: Completed 08/21/2021.    Interpreter Needed?: No  Information entered by :: Caroleen Hamman LPN   Activities of Daily Living In your present state of health, do you have any difficulty performing the following activities: 12/22/2021  Hearing? N  Vision? N  Difficulty concentrating or making decisions? N  Walking or climbing stairs? N  Dressing or bathing? N  Doing errands, shopping? N  Preparing Food and eating ? N  Using the Toilet? N  In the past six months, have you accidently leaked urine? Y  Do you have problems with loss of bowel control? N  Managing your Medications? N  Managing your Finances? N  Housekeeping  or managing your Housekeeping? N  Some recent data might be hidden    Patient Care Team: Carollee Herter, Alferd Apa, DO as PCP - General Poudyal, Ritesh, OD (Optometry) Sanjuana Kava, MD as Referring Physician (Otolaryngology) [facility] as Consulting Physician Laqueta Linden, MD as Consulting Physician [facility])  Indicate any recent Medical Services you may have received from other than [facility] providers in the past year (date may be approximate).     Assessment:   This is a routine wellness examination for Lorenna.  Hearing/Vision screen Hearing Screening - Comments:: Deaf in left ear Vision Screening - Comments:: Last eye exam-several years ago  Dietary issues and exercise activities discussed: Current Exercise Habits: The patient does not participate in regular exercise at present, Exercise limited by: None identified   Goals Addressed   None    Depression Screen PHQ 2/9 Scores 12/22/2021 10/03/2020 08/20/2020 08/08/2019 10/12/2018 01/24/2018 07/14/2013  PHQ - 2 Score 0 1 4 0 2 0 0  PHQ- 9 Score - - 10 - 8 - -  Exception Documentation - - - - Medical reason - -    Fall Risk Fall Risk  12/22/2021 10/03/2020 08/20/2020 08/08/2019 01/24/2018  Falls in the past year? 0 0 0 1 No  Number falls in past yr: 0 0 0 1 -  Injury with Fall? 0 0 0 0 -  Risk for fall due to : - Impaired balance/gait - - -  Follow up Falls prevention discussed Falls prevention discussed Falls evaluation completed Education provided;Falls prevention discussed -    FALL RISK PREVENTION PERTAINING TO THE HOME:  Any stairs in or around the home? Yes  If so, are there any without handrails? No  Home free of loose throw rugs in walkways, pet beds, electrical cords, etc? Yes  Adequate lighting in your home to reduce risk of falls? Yes   ASSISTIVE DEVICES UTILIZED TO PREVENT FALLS:  Life alert? No  Use of a cane, walker or w/c? No  Grab bars in the bathroom? Yes  Shower chair or  bench in shower? Yes  Elevated toilet seat or a handicapped toilet? No  TIMED UP AND GO:  Was the test performed? No . Phone visit   Cognitive Function:Normal cognitive status assessed by this Nurse Health Advisor. No abnormalities found.   MMSE - Mini Mental State Exam 01/24/2018  Orientation to time 5  Orientation to Place 5  Registration 3  Attention/ Calculation 5  Recall 3  Language- name 2 objects 2  Language- repeat 1  Language- follow 3 step command 3  Language- read & follow direction 1  Write a sentence 1  Copy design 1  Total score 30        Immunizations Immunization History  Administered Date(s) Administered   Tdap 06/10/2011   Zoster, Live 07/25/2015    TDAP status: Due, Education has been provided regarding the importance of this vaccine. Advised may receive this vaccine at local pharmacy or Health Dept. Aware to provide a copy of the vaccination record if obtained from local pharmacy or Health Dept. Verbalized acceptance and understanding.  Flu Vaccine status: Declined, Education has been provided regarding the importance of this vaccine but patient still declined. Advised may receive this vaccine at local pharmacy or Health Dept. Aware to provide a copy of the vaccination record if obtained from local pharmacy or Health Dept. Verbalized acceptance and understanding.  Pneumococcal vaccine status: Due, Education has been provided regarding the importance of this vaccine. Advised may receive this vaccine at local pharmacy or Health Dept. Aware to provide a copy of the vaccination record if obtained from local pharmacy or Health Dept. Verbalized acceptance and understanding.  Covid-19 vaccine status: Declined, Education has been provided regarding the importance of this vaccine but patient still declined. Advised may receive this vaccine at local pharmacy or Health Dept.or vaccine clinic. Aware to provide a copy of the vaccination record if obtained from local  pharmacy or Health Dept. Verbalized acceptance and understanding.  Qualifies for Shingles Vaccine? Yes   Zostavax completed Yes   Shingrix Completed?: No.    Education has been provided regarding the importance of this vaccine. Patient has been advised to call insurance company to determine out of pocket expense if they have not yet received this vaccine. Advised may also receive vaccine at local pharmacy or Health Dept. Verbalized acceptance and understanding.  Screening Tests Health Maintenance  Topic Date Due   Pneumonia Vaccine 61+ Years old (1 - PCV) Never done   OPHTHALMOLOGY EXAM  Never done   Zoster Vaccines- Shingrix (1 of 2) Never done   MAMMOGRAM  08/23/2015   DEXA SCAN  Never done   TETANUS/TDAP  06/09/2021   COLONOSCOPY (Pts 45-69yrs Insurance coverage will need to be confirmed)  08/11/2021   COVID-19 Vaccine (1) 01/07/2022 (Originally 04/14/1957)   INFLUENZA VACCINE  02/13/2022 (Originally 06/16/2021)   HEMOGLOBIN A1C  02/19/2022   FOOT EXAM  08/21/2022   Hepatitis C Screening  Completed   HPV VACCINES  Aged Out    Health Maintenance  Health Maintenance Due  Topic Date Due   Pneumonia Vaccine 13+ Years old (1 - PCV) Never done   OPHTHALMOLOGY EXAM  Never done   Zoster Vaccines- Shingrix (1 of 2) Never done   MAMMOGRAM  08/23/2015   DEXA SCAN  Never done   TETANUS/TDAP  06/09/2021   COLONOSCOPY (Pts 45-79yrs Insurance coverage will need to be confirmed)  08/11/2021    Colorectal cancer screening: Declined  Mammogram status: Declined  Bone Density status: Ordered 08/2021. Pt provided with contact info and advised to call to schedule appt.  Lung Cancer Screening: (  Low Dose CT Chest recommended if Age 27-80 years, 30 pack-year currently smoking OR have quit w/in 15years.) does not qualify.     Additional Screening:  Hepatitis C Screening: Completed 01/27/2016  Vision Screening: Recommended annual ophthalmology exams for early detection of glaucoma and other  disorders of the eye. Is the patient up to date with their annual eye exam?  No  Who is the provider or what is the name of the office in which the patient attends annual eye exams? unknown If pt is not established with a provider, would they like to be referred to a provider to establish care? No .   Dental Screening: Recommended annual dental exams for proper oral hygiene  Community Resource Referral / Chronic Care Management: CRR required this visit?  No   CCM required this visit?  No      Plan:     I have personally reviewed and noted the following in the patients chart:   Medical and social history Use of alcohol, tobacco or illicit drugs  Current medications and supplements including opioid prescriptions.  Functional ability and status Nutritional status Physical activity Advanced directives List of other physicians Hospitalizations, surgeries, and ER visits in previous 12 months Vitals Screenings to include cognitive, depression, and falls Referrals and appointments  In addition, I have reviewed and discussed with patient certain preventive protocols, quality metrics, and best practice recommendations. A written personalized care plan for preventive services as well as general preventive health recommendations were provided to patient.   Due to this being a telephonic visit, the after visit summary with patients personalized plan was offered to patient via mail or my-chart. Patient would like to access on my-chart.   Marta Antu, LPN   6/0/1561  Nurse Health Advisor  Nurse Notes: None

## 2021-12-20 ENCOUNTER — Other Ambulatory Visit (INDEPENDENT_AMBULATORY_CARE_PROVIDER_SITE_OTHER): Payer: Self-pay | Admitting: Family Medicine

## 2021-12-20 DIAGNOSIS — R7303 Prediabetes: Secondary | ICD-10-CM

## 2021-12-22 ENCOUNTER — Encounter (INDEPENDENT_AMBULATORY_CARE_PROVIDER_SITE_OTHER): Payer: Self-pay

## 2021-12-22 ENCOUNTER — Ambulatory Visit (INDEPENDENT_AMBULATORY_CARE_PROVIDER_SITE_OTHER): Payer: Medicare Other

## 2021-12-22 VITALS — Ht 63.0 in | Wt 180.0 lb

## 2021-12-22 DIAGNOSIS — Z0000 Encounter for general adult medical examination without abnormal findings: Secondary | ICD-10-CM

## 2021-12-22 NOTE — Patient Instructions (Signed)
Kristin Delgado , Thank you for taking time to complete your Medicare Wellness Visit. I appreciate your ongoing commitment to your health goals. Please review the following plan we discussed and let me know if I can assist you in the future.   Screening recommendations/referrals: Colonoscopy: Declined Mammogram: Declined Bone Density: Per our conversation ,you will call to schedule. Recommended yearly ophthalmology/optometry visit for glaucoma screening and checkup Recommended yearly dental visit for hygiene and checkup. Dental resources mailed today.  Vaccinations: Influenza vaccine: Declined Pneumococcal vaccine: Due-May obtain vaccine at our office or your local pharmacy. Tdap vaccine: Due-May obtain vaccine at your local pharmacy. Shingles vaccine: Due-May obtain vaccine at your local pharmacy. Covid-19:Declined  Advanced directives: Declined information today.  Conditions/risks identified: See problem list  Next appointment: Follow up in one year for your annual wellness visit    Preventive Care 65 Years and Older, Female Preventive care refers to lifestyle choices and visits with your health care provider that can promote health and wellness. What does preventive care include? A yearly physical exam. This is also called an annual well check. Dental exams once or twice a year. Routine eye exams. Ask your health care provider how often you should have your eyes checked. Personal lifestyle choices, including: Daily care of your teeth and gums. Regular physical activity. Eating a healthy diet. Avoiding tobacco and drug use. Limiting alcohol use. Practicing safe sex. Taking low-dose aspirin every day. Taking vitamin and mineral supplements as recommended by your health care provider. What happens during an annual well check? The services and screenings done by your health care provider during your annual well check will depend on your age, overall health, lifestyle risk factors, and  family history of disease. Counseling  Your health care provider may ask you questions about your: Alcohol use. Tobacco use. Drug use. Emotional well-being. Home and relationship well-being. Sexual activity. Eating habits. History of falls. Memory and ability to understand (cognition). Work and work Statistician. Reproductive health. Screening  You may have the following tests or measurements: Height, weight, and BMI. Blood pressure. Lipid and cholesterol levels. These may be checked every 5 years, or more frequently if you are over 32 years old. Skin check. Lung cancer screening. You may have this screening every year starting at age 43 if you have a 30-pack-year history of smoking and currently smoke or have quit within the past 15 years. Fecal occult blood test (FOBT) of the stool. You may have this test every year starting at age 21. Flexible sigmoidoscopy or colonoscopy. You may have a sigmoidoscopy every 5 years or a colonoscopy every 10 years starting at age 81. Hepatitis C blood test. Hepatitis B blood test. Sexually transmitted disease (STD) testing. Diabetes screening. This is done by checking your blood sugar (glucose) after you have not eaten for a while (fasting). You may have this done every 1-3 years. Bone density scan. This is done to screen for osteoporosis. You may have this done starting at age 79. Mammogram. This may be done every 1-2 years. Talk to your health care provider about how often you should have regular mammograms. Talk with your health care provider about your test results, treatment options, and if necessary, the need for more tests. Vaccines  Your health care provider may recommend certain vaccines, such as: Influenza vaccine. This is recommended every year. Tetanus, diphtheria, and acellular pertussis (Tdap, Td) vaccine. You may need a Td booster every 10 years. Zoster vaccine. You may need this after age 5. Pneumococcal 13-valent  conjugate  (PCV13) vaccine. One dose is recommended after age 54. Pneumococcal polysaccharide (PPSV23) vaccine. One dose is recommended after age 35. Talk to your health care provider about which screenings and vaccines you need and how often you need them. This information is not intended to replace advice given to you by your health care provider. Make sure you discuss any questions you have with your health care provider. Document Released: 11/29/2015 Document Revised: 07/22/2016 Document Reviewed: 09/03/2015 Elsevier Interactive Patient Education  2017 [employer] Prevention in the Home Falls can cause injuries. They can happen to people of all ages. There are many things you can do to make your home safe and to help prevent falls. What can I do on the outside of my home? Regularly fix the edges of walkways and driveways and fix any cracks. Remove anything that might make you trip as you walk through a door, such as a raised step or threshold. Trim any bushes or trees on the path to your home. Use bright outdoor lighting. Clear any walking paths of anything that might make someone trip, such as rocks or tools. Regularly check to see if handrails are loose or broken. Make sure that both sides of any steps have handrails. Any raised decks and porches should have guardrails on the edges. Have any leaves, snow, or ice cleared regularly. Use sand or salt on walking paths during winter. Clean up any spills in your garage right away. This includes oil or grease spills. What can I do in the bathroom? Use night lights. Install grab bars by the toilet and in the tub and shower. Do not use towel bars as grab bars. Use non-skid mats or decals in the tub or shower. If you need to sit down in the shower, use a plastic, non-slip stool. Keep the floor dry. Clean up any water that spills on the floor as soon as it happens. Remove soap buildup in the tub or shower regularly. Attach bath mats securely with  double-sided non-slip rug tape. Do not have throw rugs and other things on the floor that can make you trip. What can I do in the bedroom? Use night lights. Make sure that you have a light by your bed that is easy to reach. Do not use any sheets or blankets that are too big for your bed. They should not hang down onto the floor. Have a firm chair that has side arms. You can use this for support while you get dressed. Do not have throw rugs and other things on the floor that can make you trip. What can I do in the kitchen? Clean up any spills right away. Avoid walking on wet floors. Keep items that you use a lot in easy-to-reach places. If you need to reach something above you, use a strong step stool that has a grab bar. Keep electrical cords out of the way. Do not use floor polish or wax that makes floors slippery. If you must use wax, use non-skid floor wax. Do not have throw rugs and other things on the floor that can make you trip. What can I do with my stairs? Do not leave any items on the stairs. Make sure that there are handrails on both sides of the stairs and use them. Fix handrails that are broken or loose. Make sure that handrails are as long as the stairways. Check any carpeting to make sure that it is firmly attached to the stairs. Fix any carpet that  is loose or worn. Avoid having throw rugs at the top or bottom of the stairs. If you do have throw rugs, attach them to the floor with carpet tape. Make sure that you have a light switch at the top of the stairs and the bottom of the stairs. If you do not have them, ask someone to add them for you. What else can I do to help prevent falls? Wear shoes that: Do not have high heels. Have rubber bottoms. Are comfortable and fit you well. Are closed at the toe. Do not wear sandals. If you use a stepladder: Make sure that it is fully opened. Do not climb a closed stepladder. Make sure that both sides of the stepladder are locked  into place. Ask someone to hold it for you, if possible. Clearly mark and make sure that you can see: Any grab bars or handrails. First and last steps. Where the edge of each step is. Use tools that help you move around (mobility aids) if they are needed. These include: Canes. Walkers. Scooters. Crutches. Turn on the lights when you go into a dark area. Replace any light bulbs as soon as they burn out. Set up your furniture so you have a clear path. Avoid moving your furniture around. If any of your floors are uneven, fix them. If there are any pets around you, be aware of where they are. Review your medicines with your doctor. Some medicines can make you feel dizzy. This can increase your chance of falling. Ask your doctor what other things that you can do to help prevent falls. This information is not intended to replace advice given to you by your health care provider. Make sure you discuss any questions you have with your health care provider. Document Released: 08/29/2009 Document Revised: 04/09/2016 Document Reviewed: 12/07/2014 Elsevier Interactive Patient Education  2017 [employer].

## 2022-02-10 ENCOUNTER — Ambulatory Visit (INDEPENDENT_AMBULATORY_CARE_PROVIDER_SITE_OTHER): Payer: Medicare Other | Admitting: Family Medicine

## 2022-02-10 ENCOUNTER — Encounter (INDEPENDENT_AMBULATORY_CARE_PROVIDER_SITE_OTHER): Payer: Self-pay | Admitting: Family Medicine

## 2022-02-10 ENCOUNTER — Other Ambulatory Visit: Payer: Self-pay

## 2022-02-10 VITALS — BP 163/80 | HR 56 | Temp 98.0°F | Ht 63.0 in | Wt 182.0 lb

## 2022-02-10 DIAGNOSIS — E669 Obesity, unspecified: Secondary | ICD-10-CM | POA: Diagnosis not present

## 2022-02-10 DIAGNOSIS — Z6832 Body mass index (BMI) 32.0-32.9, adult: Secondary | ICD-10-CM

## 2022-02-10 DIAGNOSIS — E7849 Other hyperlipidemia: Secondary | ICD-10-CM

## 2022-02-10 DIAGNOSIS — I10 Essential (primary) hypertension: Secondary | ICD-10-CM | POA: Diagnosis not present

## 2022-02-10 MED ORDER — CHLORTHALIDONE 25 MG PO TABS: 25.0000 mg | ORAL_TABLET | Freq: Every day | ORAL | 0 refills | Status: DC

## 2022-02-10 MED ORDER — LISINOPRIL 10 MG PO TABS: 10.0000 mg | ORAL_TABLET | Freq: Every day | ORAL | 0 refills | Status: DC

## 2022-02-10 MED ORDER — ATORVASTATIN CALCIUM 20 MG PO TABS: 20.0000 mg | ORAL_TABLET | Freq: Every evening | ORAL | 0 refills | Status: DC

## 2022-02-11 NOTE — Progress Notes (Signed)
? ? ? ?Chief Complaint:  ? ?OBESITY ?Kristin Delgado is here to discuss her progress with her obesity treatment plan along with follow-up of her obesity related diagnoses. Kristin Delgado is on keeping a food journal and adhering to recommended goals of 1250 calories and 75-80+ grams of protein daily and states she is following her eating plan approximately 80% of the time. Kristin Delgado states she is walking the dog more 7 times per week. ? ?Today's visit was #: 4 ?Starting weight: 189 lbs ?Starting date: 10/12/2018 ?Today's weight: 182 lbs ?Today's date: 02/10/2022 ?Total lbs lost to date: 7 ?Total lbs lost since last in-office visit: 0 ? ?Interim History: Kristin Delgado is working on increasing her protein and walking. She is doing well with meeting her protein goals, but she feels her calories are too low at times. She is getting ready to start her vegetable garden.  ? ?Subjective:  ? ?1. Essential hypertension ?Kristin Delgado's blood pressure is elevated today. She is off hydrochlorothiazide due to pharmacy shortages. ? ?2. Other hyperlipidemia ?Kristin Delgado is stable on Lipitor, with no side effects noted. She is doing well on a low cholesterol diet. ? ?Assessment/Plan:  ? ?1. Essential hypertension ?Kristin Delgado agreed to discontinue hydrochlorothiazide, and start chlorthalidone 25 mg q daily with a 90 day supply. We will refill lisinopril for 90 days with no refills. ? ?- lisinopril (ZESTRIL) 10 MG tablet; Take 1 tablet (10 mg total) by mouth at bedtime.  Dispense: 90 tablet; Refill: 0 ?- chlorthalidone (HYGROTON) 25 MG tablet; Take 1 tablet (25 mg total) by mouth daily.  Dispense: 90 tablet; Refill: 0 ? ?2. Other hyperlipidemia ?Kristin Delgado will continue Lipitor, and we will refill for 90 days with no refills. ? ?- atorvastatin (LIPITOR) 20 MG tablet; Take 1 tablet (20 mg total) by mouth at bedtime.  Dispense: 90 tablet; Refill: 0 ? ?3. Obesity with current BMI of 32.3 ?Kristin Delgado is currently in the action stage of change. As such, her goal is to continue  with weight loss efforts. She has agreed to keeping a food journal and adhering to recommended goals of 1200 calories and 75+ grams of protein daily.  ? ?Exercise goals: As is. ? ?Behavioral modification strategies: increasing lean protein intake and no skipping meals. ? ?Kristin Delgado has agreed to follow-up with our clinic in 12 weeks. She was informed of the importance of frequent follow-up visits to maximize her success with intensive lifestyle modifications for her multiple health conditions.  ? ?Objective:  ? ?Blood pressure (!) 163/80, pulse (!) 56, temperature 98 ?F (36.7 ?C), height '5\' 3"'$  (1.6 m), weight 182 lb (82.6 kg), last menstrual period 06/10/2011, SpO2 100 %. ?Body mass index is 32.24 kg/m?. ? ?General: Cooperative, alert, well developed, in no acute distress. ?HEENT: Conjunctivae and lids unremarkable. ?Cardiovascular: Regular rhythm.  ?Lungs: Normal work of breathing. ?Neurologic: No focal deficits.  ? ?Lab Results  ?Component Value Date  ? CREATININE 0.78 08/21/2021  ? BUN 19 08/21/2021  ? NA 140 08/21/2021  ? K 4.3 08/21/2021  ? CL 104 08/21/2021  ? CO2 31 08/21/2021  ? ?Lab Results  ?Component Value Date  ? ALT 18 08/21/2021  ? AST 17 08/21/2021  ? ALKPHOS 61 08/21/2021  ? BILITOT 0.3 08/21/2021  ? ?Lab Results  ?Component Value Date  ? HGBA1C 5.7 08/21/2021  ? HGBA1C 5.4 11/23/2019  ? HGBA1C 5.2 06/22/2019  ? HGBA1C 5.3 01/23/2019  ? HGBA1C 5.6 10/12/2018  ? ?Lab Results  ?Component Value Date  ? INSULIN 10.3 11/23/2019  ? INSULIN 6.7  06/22/2019  ? INSULIN 6.2 01/23/2019  ? INSULIN 5.9 10/12/2018  ? ?Lab Results  ?Component Value Date  ? TSH 2.340 12/01/2019  ? ?Lab Results  ?Component Value Date  ? CHOL 234 (H) 08/21/2021  ? HDL 37.00 (L) 08/21/2021  ? [place] 94 02/18/2021  ? LDLDIRECT 113.0 08/21/2021  ? TRIG 371.0 (H) 08/21/2021  ? CHOLHDL 6 08/21/2021  ? ?Lab Results  ?Component Value Date  ? VD25OH 47.07 08/21/2021  ? VD25OH 86.5 11/23/2019  ? VD25OH 52.5 01/23/2019  ? ?Lab Results  ?Component  Value Date  ? WBC 5.7 02/18/2021  ? HGB 12.0 02/18/2021  ? HCT 37.3 02/18/2021  ? MCV 80.8 02/18/2021  ? PLT 182.0 02/18/2021  ? ?Lab Results  ?Component Value Date  ? IRON 52 12/12/2007  ? ? ?Obesity Behavioral Intervention:  ? ?Approximately 15 minutes were spent on the discussion below. ? ?ASK: ?We discussed the diagnosis of obesity with Kristin Delgado today and Kristin Delgado agreed to give [name] permission to discuss obesity behavioral modification therapy today. ? ?ASSESS: ?Kristin Delgado has the diagnosis of obesity and her BMI today is 32.3. Kristin Delgado is in the action stage of change.  ? ?ADVISE: ?Kristin Delgado was educated on the multiple health risks of obesity as well as the benefit of weight loss to improve her health. She was advised of the need for long term treatment and the importance of lifestyle modifications to improve her current health and to decrease her risk of future health problems. ? ?AGREE: ?Multiple dietary modification options and treatment options were discussed and Kristin Delgado agreed to follow the recommendations documented in the above note. ? ?ARRANGE: ?Kristin Delgado was educated on the importance of frequent visits to treat obesity as outlined per CMS and USPSTF guidelines and agreed to schedule her next follow up appointment today. ? ?Attestation Statements:  ? ?Reviewed by clinician on day of visit: allergies, medications, problem list, medical history, surgical history, family history, social history, and previous encounter notes. ? ? ?Kristin Delgado, Kristin Delgado, am acting as transcriptionist for Dennard Nip, MD. ? ?Kristin Delgado have reviewed the above documentation for accuracy and completeness, and Kristin Delgado agree with the above. -  Dennard Nip, MD ? ? ?

## 2022-02-27 ENCOUNTER — Encounter: Payer: Medicare Other | Admitting: Family Medicine

## 2022-03-06 ENCOUNTER — Encounter: Payer: Medicare Other | Admitting: Family Medicine

## 2022-03-17 ENCOUNTER — Other Ambulatory Visit: Payer: Self-pay | Admitting: Family Medicine

## 2022-03-17 DIAGNOSIS — F419 Anxiety disorder, unspecified: Secondary | ICD-10-CM

## 2022-04-01 IMAGING — CT CT ANGIO CHEST
2 of 7 series · 15 of 46 positions shown · IV contrast (OMNIPAQUE)
Comparison: 12/28/2019

CLINICAL DATA: Thoracic aortic aneurysm without rupture. Outpatient
follow-up.

EXAM:
CT ANGIOGRAPHY CHEST WITH CONTRAST
TECHNIQUE: Multidetector CT imaging of the chest was performed using the
standard protocol during bolus administration of intravenous
contrast. Multiplanar CT image reconstructions and MIPs were
obtained to evaluate the vascular anatomy.
CONTRAST:  75mL OMNIPAQUE IOHEXOL 350 MG/ML SOLN

[Series 5: axial thins · axial · 0.64mm/px · z∈[+1213,+1587]mm · 12 of 414 slices shown]
[im 20/414  lung]
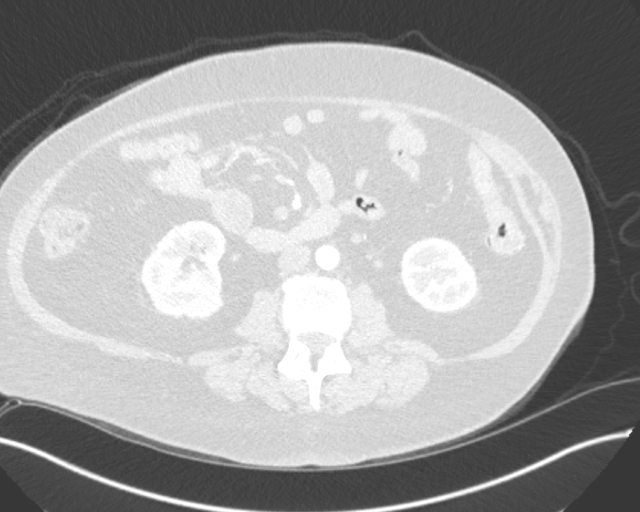
[im 60/414  soft-tissue]
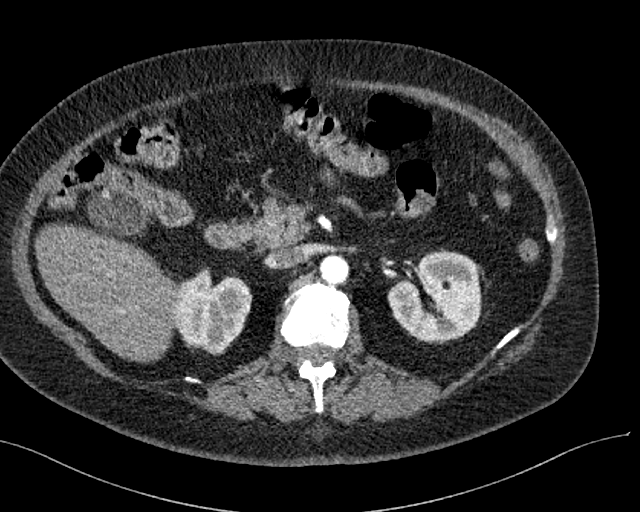
[im 99/414  lung]
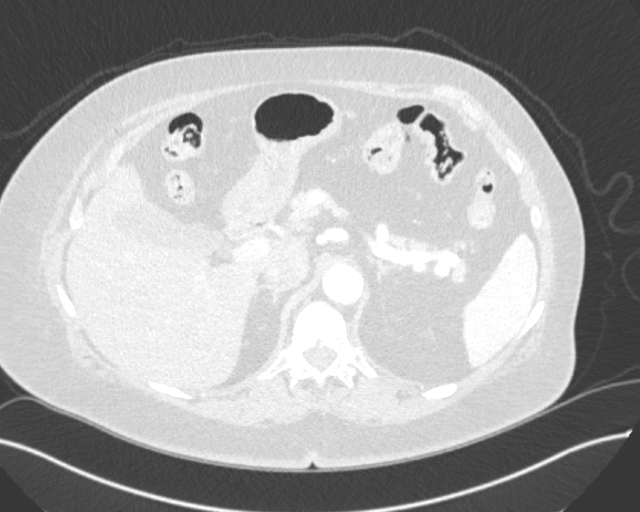
[im 119/414  soft-tissue]
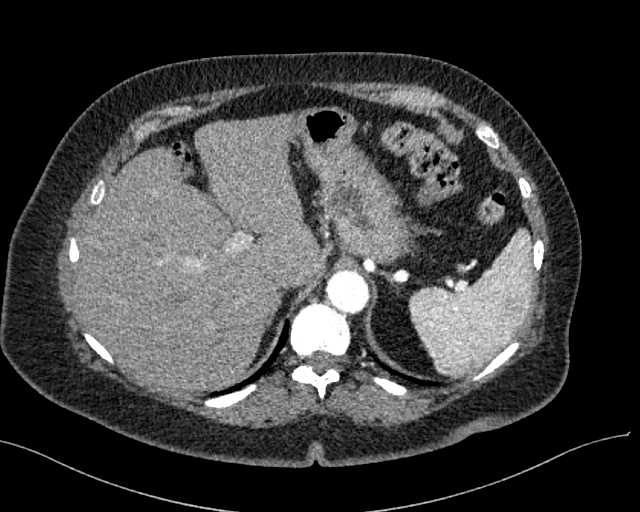
[im 158/414  lung]
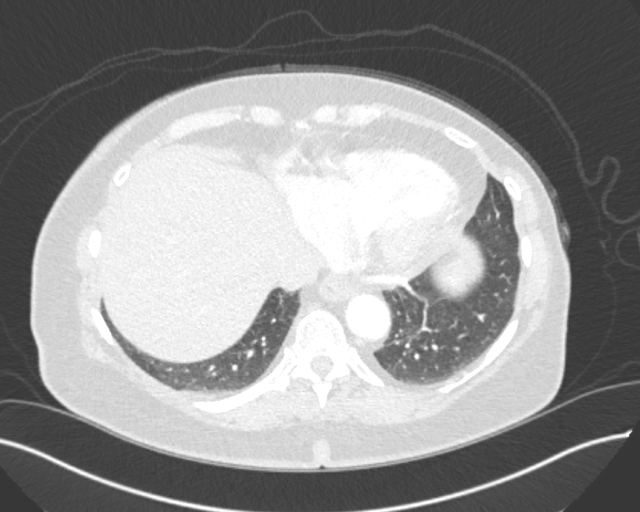
[im 197/414  soft-tissue]
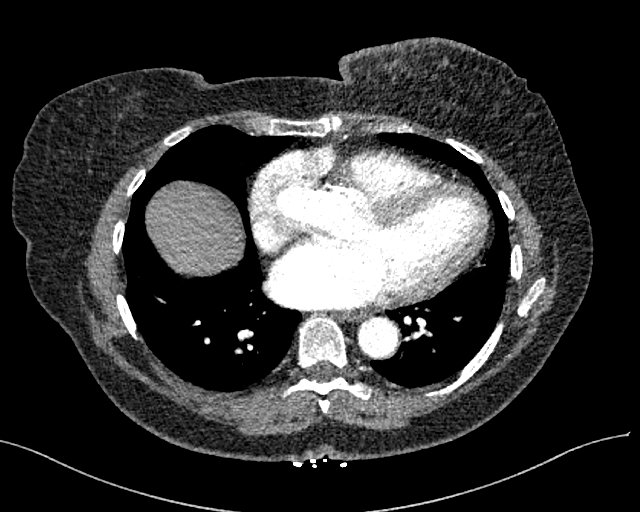
[im 217/414  lung]
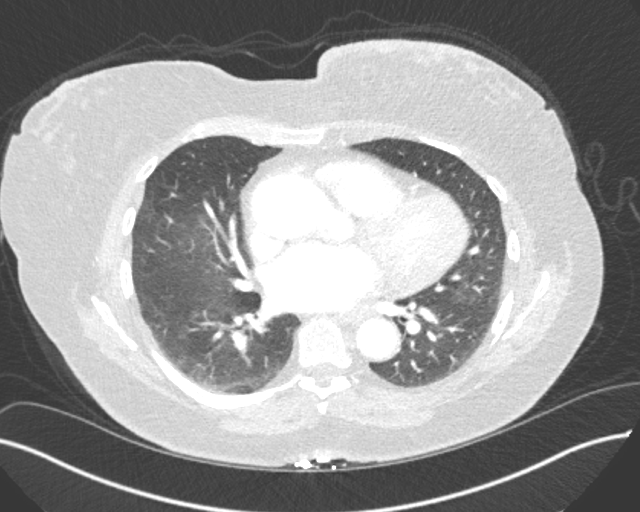
[im 256/414  soft-tissue]
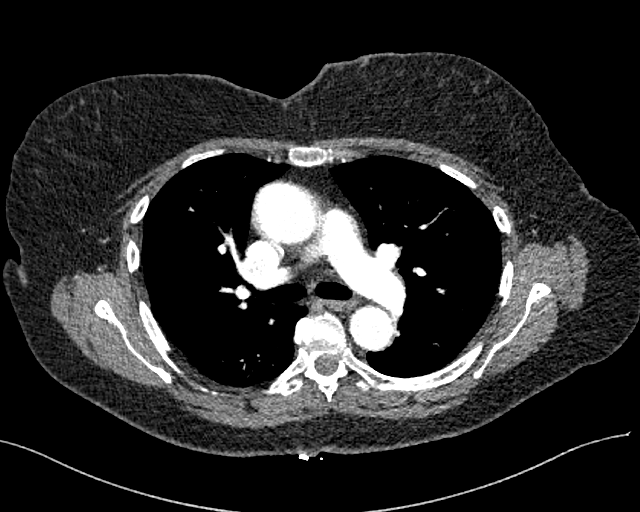
[im 296/414  lung]
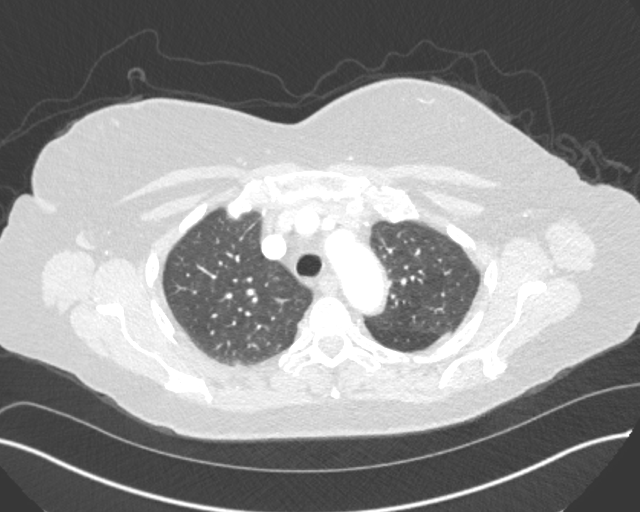
[im 315/414  soft-tissue]
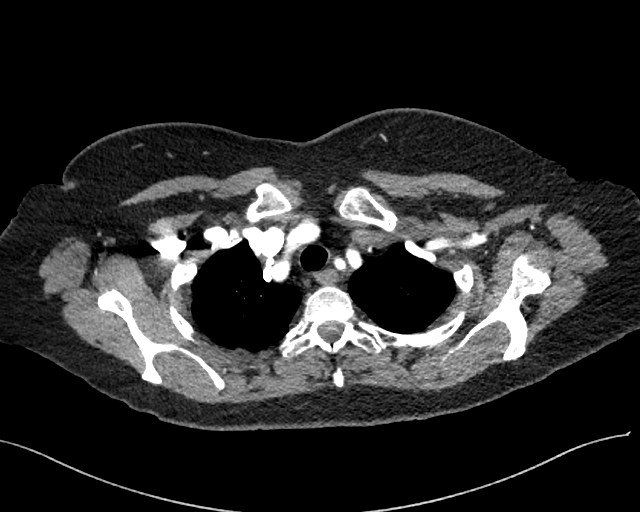
[im 355/414  lung]
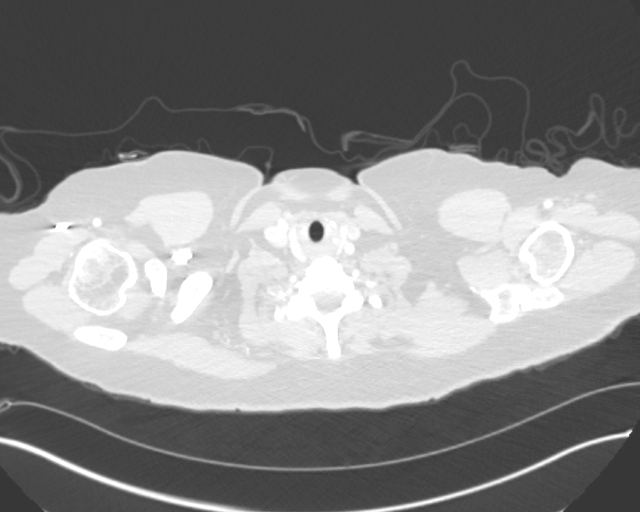
[im 394/414  soft-tissue]
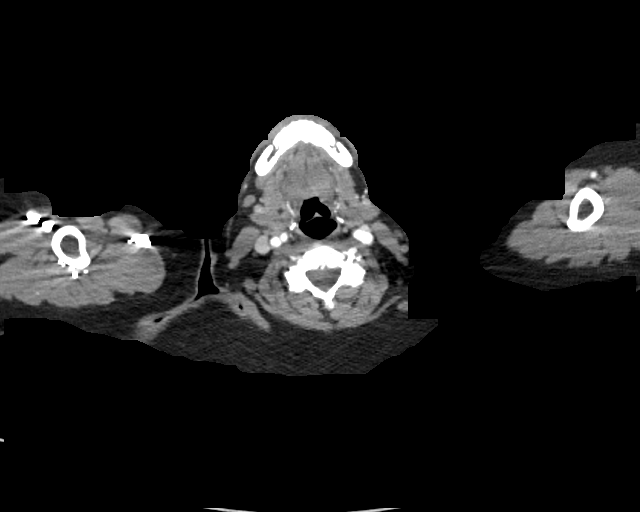

[Series 6: coronal thins · coronal · 0.81mm/px · 3 of 152 slices shown]
[im 51/152  soft-tissue]
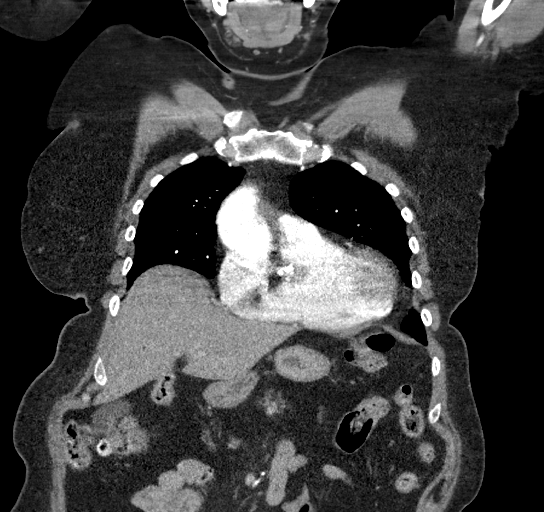
[im 76/152  soft-tissue]
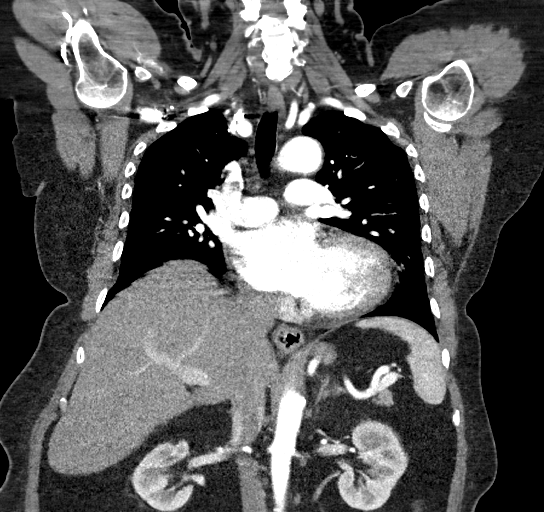
[im 101/152  soft-tissue]
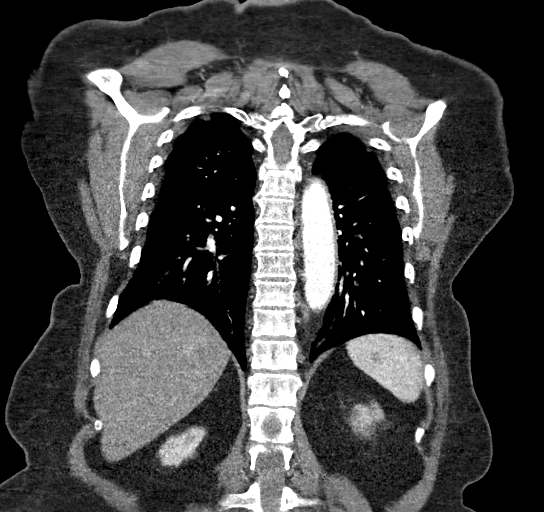

[15 of 46 positions shown; findings below may reference images not displayed]

FINDINGS: Cardiovascular: Similar mild fusiform aneurysmal dilatation of the
ascending thoracic aorta. Maximal diameter 4.2 cm, previously 4 cm.
No associated dissection. Patent 3 vessel arch anatomy. Minor
atherosclerotic change. No mediastinal hemorrhage or hematoma.
Calcifications of the aortic valve noted. Stable heart size without
pericardial effusion.

Pulmonary arteries are normal in caliber. Branching hypodense
filling defects within proximal segmental pulmonary arteries to the
right middle lobe, images 178 through 190 series 5. Very small
thrombus burden isolated to the proximal segmental right middle lobe
branches. No other pulmonary emboli. Central and proximal hilar
pulmonary vascularity remains patent. No evidence of heart strain.

Mediastinum/Nodes: No enlarged mediastinal, hilar, or axillary lymph
nodes. Thyroid gland, trachea, and esophagus demonstrate no
significant findings.

Lungs/Pleura: Very minimal depended basilar
atelectasis/hypoventilatory changes. No acute airspace process,
collapse or consolidation. No pleural abnormality, effusion or
pneumothorax.

Upper Abdomen: Small hiatal hernia noted. No acute upper abdominal
finding.

Musculoskeletal: Minor degenerative changes of the thoracic spine.

Review of the MIP images confirms the above findings.
IMPRESSION: Small right middle lobe proximal segmental pulmonary emboli. See
above comment.

4.2 cm ascending thoracic aortic aneurysm.

Recommend annual imaging followup by CTA or MRA. This recommendation
follows 0454 ACCF/AHA/AATS/ACR/ASA/SCA/COMER/BABAR/DOSAMORES/BARBA Guidelines
for the Diagnosis and Management of Patients with Thoracic Aortic
Disease. Circulation. 0454; 121: [id number]. Aortic aneurysm NOS
(C3V2C-KU1.E)

Aortic Atherosclerosis (C3V2C-KAB.B).

These results will be called to the ordering clinician or
representative by the Radiologist Assistant, and communication
documented in the PACS or [REDACTED].

## 2022-05-04 ENCOUNTER — Ambulatory Visit (INDEPENDENT_AMBULATORY_CARE_PROVIDER_SITE_OTHER): Payer: Medicare Other | Admitting: Family Medicine

## 2022-05-04 ENCOUNTER — Encounter (INDEPENDENT_AMBULATORY_CARE_PROVIDER_SITE_OTHER): Payer: Self-pay | Admitting: Family Medicine

## 2022-05-04 VITALS — BP 123/77 | HR 55 | Temp 98.3°F | Ht 63.0 in | Wt 179.0 lb

## 2022-05-04 DIAGNOSIS — R198 Other specified symptoms and signs involving the digestive system and abdomen: Secondary | ICD-10-CM | POA: Diagnosis not present

## 2022-05-04 DIAGNOSIS — Z6831 Body mass index (BMI) 31.0-31.9, adult: Secondary | ICD-10-CM

## 2022-05-04 DIAGNOSIS — I10 Essential (primary) hypertension: Secondary | ICD-10-CM

## 2022-05-04 DIAGNOSIS — E669 Obesity, unspecified: Secondary | ICD-10-CM | POA: Diagnosis not present

## 2022-05-04 DIAGNOSIS — E7849 Other hyperlipidemia: Secondary | ICD-10-CM | POA: Diagnosis not present

## 2022-05-04 MED ORDER — LISINOPRIL 10 MG PO TABS: 10.0000 mg | ORAL_TABLET | Freq: Every day | ORAL | 0 refills | Status: AC

## 2022-05-04 MED ORDER — CHLORTHALIDONE 25 MG PO TABS: 25.0000 mg | ORAL_TABLET | Freq: Every day | ORAL | 0 refills | Status: AC

## 2022-05-04 MED ORDER — ATORVASTATIN CALCIUM 20 MG PO TABS: 20.0000 mg | ORAL_TABLET | Freq: Every evening | ORAL | 0 refills | Status: DC

## 2022-05-04 NOTE — Progress Notes (Signed)
Chief Complaint:   OBESITY Kristin Delgado is here to discuss her progress with her obesity treatment plan along with follow-up of her obesity related diagnoses. Kristin Delgado is on keeping a food journal and adhering to recommended goals of 1200 calories and 75+ grams of protein daily and states she is following her eating plan approximately 90% of the time. Kristin Delgado states she is walking the dog for 20-30 minutes 7 times per week.  Today's visit was #: 85 Starting weight: 189 lbs Starting date: 10/12/2018 Today's weight: 179 lbs Today's date: 05/04/2022 Total lbs lost to date: 10 Total lbs lost since last in-office visit: 3  Interim History: Kristin Delgado is doing well with weight loss. She enjoys the freedom of food journaling. She is open to breakfast suggestions.  Subjective:   1. Alternating constipation and diarrhea Kristin Delgado notes alternating loose stools with constipation, and worse recently. She has no history of IBS. She denies abdominal pain or rectal bleeding.  2. Essential hypertension Kristin Delgado's blood pressure is stable on her medications. She denies chest pain, headache, or dizziness.   3. Other hyperlipidemia Kristin Delgado is stable on Lipitor, and she denies chest pain or myalgias. She requests a refill today.   Assessment/Plan:   1. Alternating constipation and diarrhea Kristin Delgado was educated on simple carbohydrates and GI motion. She was encouraged to increase her fiber to help regulate both symptoms.   2. Essential hypertension We will refill lisinopril and chlorthalidone for 90 days, with no refills. We will recheck labs at her next visit.  - lisinopril (ZESTRIL) 10 MG tablet; Take 1 tablet (10 mg total) by mouth at bedtime.  Dispense: 90 tablet; Refill: 0 - chlorthalidone (HYGROTON) 25 MG tablet; Take 1 tablet (25 mg total) by mouth daily.  Dispense: 90 tablet; Refill: 0  3. Other hyperlipidemia We will refill Lipitor for 90 days, with no refills. We will recheck labs at her next  visit.  - atorvastatin (LIPITOR) 20 MG tablet; Take 1 tablet (20 mg total) by mouth at bedtime.  Dispense: 90 tablet; Refill: 0  4. Obesity with current BMI of 31.8 Kristin Delgado is currently in the action stage of change. As such, her goal is to continue with weight loss efforts. She has agreed to keeping a food journal and adhering to recommended goals of 1200 calories and 75+ grams of protein daily.   Breakfast ideas were discussed to help her meet her protein goals.  We will recheck fasting labs at her next visit.  Exercise goals: As is.  Behavioral modification strategies: increasing lean protein intake and meal planning and cooking strategies.  Kristin Delgado has agreed to follow-up with our clinic in 4 weeks. She was informed of the importance of frequent follow-up visits to maximize her success with intensive lifestyle modifications for her multiple health conditions.   Objective:   Blood pressure 123/77, pulse (!) 55, temperature 98.3 F (36.8 C), height '5\' 3"'$  (1.6 m), weight 179 lb (81.2 kg), last menstrual period 06/10/2011, SpO2 95 %. Body mass index is 31.71 kg/m.  General: Cooperative, alert, well developed, in no acute distress. HEENT: Conjunctivae and lids unremarkable. Cardiovascular: Regular rhythm.  Lungs: Normal work of breathing. Neurologic: No focal deficits.   Lab Results  Component Value Date   CREATININE 0.78 08/21/2021   BUN 19 08/21/2021   NA 140 08/21/2021   K 4.3 08/21/2021   CL 104 08/21/2021   CO2 31 08/21/2021   Lab Results  Component Value Date   ALT 18 08/21/2021   AST  17 08/21/2021   ALKPHOS 61 08/21/2021   BILITOT 0.3 08/21/2021   Lab Results  Component Value Date   HGBA1C 5.7 08/21/2021   HGBA1C 5.4 11/23/2019   HGBA1C 5.2 06/22/2019   HGBA1C 5.3 01/23/2019   HGBA1C 5.6 10/12/2018   Lab Results  Component Value Date   INSULIN 10.3 11/23/2019   INSULIN 6.7 06/22/2019   INSULIN 6.2 01/23/2019   INSULIN 5.9 10/12/2018   Lab Results   Component Value Date   TSH 2.340 12/01/2019   Lab Results  Component Value Date   CHOL 234 (H) 08/21/2021   HDL 37.00 (L) 08/21/2021   LDLCALC 94 02/18/2021   LDLDIRECT 113.0 08/21/2021   TRIG 371.0 (H) 08/21/2021   CHOLHDL 6 08/21/2021   Lab Results  Component Value Date   VD25OH 47.07 08/21/2021   VD25OH 86.5 11/23/2019   VD25OH 52.5 01/23/2019   Lab Results  Component Value Date   WBC 5.7 02/18/2021   HGB 12.0 02/18/2021   HCT 37.3 02/18/2021   MCV 80.8 02/18/2021   PLT 182.0 02/18/2021   Lab Results  Component Value Date   IRON 52 12/12/2007   Attestation Statements:   Reviewed by clinician on day of visit: allergies, medications, problem list, medical history, surgical history, family history, social history, and previous encounter notes.  Time spent on visit including pre-visit chart review and post-visit care and charting was 40 minutes.   I, Trixie Dredge, am acting as transcriptionist for Dennard Nip, MD.  I have reviewed the above documentation for accuracy and completeness, and I agree with the above. -  Dennard Nip, MD

## 2022-06-24 ENCOUNTER — Encounter (INDEPENDENT_AMBULATORY_CARE_PROVIDER_SITE_OTHER): Payer: Self-pay

## 2022-08-04 ENCOUNTER — Ambulatory Visit (INDEPENDENT_AMBULATORY_CARE_PROVIDER_SITE_OTHER): Payer: Medicare Other | Admitting: Family Medicine

## 2022-08-04 ENCOUNTER — Encounter (INDEPENDENT_AMBULATORY_CARE_PROVIDER_SITE_OTHER): Payer: Self-pay | Admitting: Family Medicine

## 2022-08-04 VITALS — BP 141/83 | HR 57 | Temp 97.8°F | Ht 63.0 in | Wt 171.0 lb

## 2022-08-04 DIAGNOSIS — Z6833 Body mass index (BMI) 33.0-33.9, adult: Secondary | ICD-10-CM

## 2022-08-04 DIAGNOSIS — Z6830 Body mass index (BMI) 30.0-30.9, adult: Secondary | ICD-10-CM | POA: Diagnosis not present

## 2022-08-04 DIAGNOSIS — I10 Essential (primary) hypertension: Secondary | ICD-10-CM | POA: Diagnosis not present

## 2022-08-04 DIAGNOSIS — R1319 Other dysphagia: Secondary | ICD-10-CM | POA: Diagnosis not present

## 2022-08-04 DIAGNOSIS — E669 Obesity, unspecified: Secondary | ICD-10-CM

## 2022-08-04 DIAGNOSIS — R1310 Dysphagia, unspecified: Secondary | ICD-10-CM | POA: Insufficient documentation

## 2022-08-04 DIAGNOSIS — E7849 Other hyperlipidemia: Secondary | ICD-10-CM

## 2022-08-04 MED ORDER — ATORVASTATIN CALCIUM 20 MG PO TABS: 20.0000 mg | ORAL_TABLET | Freq: Every evening | ORAL | 0 refills | Status: AC

## 2022-08-11 NOTE — Progress Notes (Signed)
Chief Complaint:   OBESITY Kristin Delgado is here to discuss her progress with her obesity treatment plan along with follow-up of her obesity related diagnoses. [name] is on keeping a food journal and adhering to recommended goals of 1200 calories and 75+ grams of protein daily and states she is following her eating plan approximately 0% of the time. [name] states she is walking for 30 minutes 5 times per week.  Today's visit was #: 16 Starting weight: 189 lbs Starting date: 10/12/2018 Today's weight: 171 lbs Today's date: 08/04/2022 Total lbs lost to date: 18 Total lbs lost since last in-office visit: 8  Interim History: Kristin Delgado has eaten a lot less food and may not be eating all of her calories or protein due to dysphagia.   Subjective:   1. Other dysphagia [name] notes food has been feeling "stuck" in her throat, and she feels this is worse with constipation but she has alternating episodes of diarrhea. She uses miralax and Benefiber.   2. Other hyperlipidemia [name] continues to work on decreasing cholesterol in her diet. She has no problems with Lipitor.  3. Essential hypertension [name]'s blood pressure is elevated again. She is occasionally checking her blood pressure at home.   Assessment/Plan:   1. Other dysphagia [name] was advised to discuss this with her PCP, Dr. Etter Sjogren.   2. Other hyperlipidemia [name] will continue Lipitor 20 mg qhs, and we will refill for 90 days.   - atorvastatin (LIPITOR) 20 MG tablet; Take 1 tablet (20 mg total) by mouth at bedtime.  Dispense: 90 tablet; Refill: 0  3. Essential hypertension Kristin Delgado is to check her blood pressure 2-3 times per week and bring in her log to her next visit.   4. Obesity, Current BMI 30.3 [name] is currently in the action stage of change. As such, her goal is to continue with weight loss efforts. She has agreed to keeping a food journal and adhering to recommended goals of 1200 calories and 70+ grams of  protein daily.   Kristin Delgado is to follow-up with Dr. Etter Sjogren. We discussed causes of dysphagia and possible treatment options.   Exercise goals: As is.   Behavioral modification strategies: increasing lean protein intake.  Kristin Delgado has agreed to follow-up with our clinic in 12 weeks. She was informed of the importance of frequent follow-up visits to maximize her success with intensive lifestyle modifications for her multiple health conditions.   Objective:   Blood pressure (!) 141/83, pulse (!) 57, temperature 97.8 F (36.6 C), height '5\' 3"'$  (1.6 m), weight 171 lb (77.6 kg), last menstrual period 06/10/2011, SpO2 97 %. Body mass index is 30.29 kg/m.  General: Cooperative, alert, well developed, in no acute distress. HEENT: Conjunctivae and lids unremarkable. Cardiovascular: Regular rhythm.  Lungs: Normal work of breathing. Neurologic: No focal deficits.   Lab Results  Component Value Date   CREATININE 0.78 08/21/2021   BUN 19 08/21/2021   NA 140 08/21/2021   K 4.3 08/21/2021   CL 104 08/21/2021   CO2 31 08/21/2021   Lab Results  Component Value Date   ALT 18 08/21/2021   AST 17 08/21/2021   ALKPHOS 61 08/21/2021   BILITOT 0.3 08/21/2021   Lab Results  Component Value Date   HGBA1C 5.7 08/21/2021   HGBA1C 5.4 11/23/2019   HGBA1C 5.2 06/22/2019   HGBA1C 5.3 01/23/2019   HGBA1C 5.6 10/12/2018   Lab Results  Component Value Date   INSULIN 10.3 11/23/2019   INSULIN 6.7 06/22/2019  INSULIN 6.2 01/23/2019   INSULIN 5.9 10/12/2018   Lab Results  Component Value Date   TSH 2.340 12/01/2019   Lab Results  Component Value Date   CHOL 234 (H) 08/21/2021   HDL 37.00 (L) 08/21/2021   LDLCALC 94 02/18/2021   LDLDIRECT 113.0 08/21/2021   TRIG 371.0 (H) 08/21/2021   CHOLHDL 6 08/21/2021   Lab Results  Component Value Date   VD25OH 47.07 08/21/2021   VD25OH 86.5 11/23/2019   VD25OH 52.5 01/23/2019   Lab Results  Component Value Date   WBC 5.7 02/18/2021   HGB 12.0  02/18/2021   HCT 37.3 02/18/2021   MCV 80.8 02/18/2021   PLT 182.0 02/18/2021   Lab Results  Component Value Date   IRON 52 12/12/2007   Attestation Statements:   Reviewed by clinician on day of visit: allergies, medications, problem list, medical history, surgical history, family history, social history, and previous encounter notes.  Time spent on visit including pre-visit chart review and post-visit care and charting was 40 minutes.   I, Trixie Dredge, am acting as transcriptionist for Dennard Nip, MD.  I have reviewed the above documentation for accuracy and completeness, and I agree with the above. -  Dennard Nip, MD

## 2022-08-14 ENCOUNTER — Other Ambulatory Visit (INDEPENDENT_AMBULATORY_CARE_PROVIDER_SITE_OTHER): Payer: Self-pay | Admitting: Family Medicine

## 2022-08-14 DIAGNOSIS — I10 Essential (primary) hypertension: Secondary | ICD-10-CM

## 2022-08-18 ENCOUNTER — Encounter: Payer: Self-pay | Admitting: Family Medicine

## 2022-08-18 ENCOUNTER — Ambulatory Visit (INDEPENDENT_AMBULATORY_CARE_PROVIDER_SITE_OTHER): Payer: Medicare Other | Admitting: Family Medicine

## 2022-08-18 VITALS — BP 118/80 | HR 77 | Temp 98.8°F | Resp 16 | Ht 63.0 in | Wt 172.2 lb

## 2022-08-18 DIAGNOSIS — R1310 Dysphagia, unspecified: Secondary | ICD-10-CM | POA: Diagnosis not present

## 2022-08-18 DIAGNOSIS — I10 Essential (primary) hypertension: Secondary | ICD-10-CM

## 2022-08-18 DIAGNOSIS — E7849 Other hyperlipidemia: Secondary | ICD-10-CM

## 2022-08-18 DIAGNOSIS — K582 Mixed irritable bowel syndrome: Secondary | ICD-10-CM | POA: Insufficient documentation

## 2022-08-18 DIAGNOSIS — R1319 Other dysphagia: Secondary | ICD-10-CM | POA: Diagnosis not present

## 2022-08-18 LAB — CBC WITH DIFFERENTIAL/PLATELET
Basophils Absolute: 0.1 10*3/uL (ref 0.0–0.1)
Basophils Relative: 0.6 % (ref 0.0–3.0)
Eosinophils Absolute: 0.2 10*3/uL (ref 0.0–0.7)
Eosinophils Relative: 1.9 % (ref 0.0–5.0)
HCT: 35.3 % — ABNORMAL LOW (ref 36.0–46.0)
Hemoglobin: 11.6 g/dL — ABNORMAL LOW (ref 12.0–15.0)
Lymphocytes Relative: 17.8 % (ref 12.0–46.0)
Lymphs Abs: 1.8 10*3/uL (ref 0.7–4.0)
MCHC: 32.7 g/dL (ref 30.0–36.0)
MCV: 77.8 fl — ABNORMAL LOW (ref 78.0–100.0)
Monocytes Absolute: 0.6 10*3/uL (ref 0.1–1.0)
Monocytes Relative: 5.8 % (ref 3.0–12.0)
Neutro Abs: 7.4 10*3/uL (ref 1.4–7.7)
Neutrophils Relative %: 73.9 % (ref 43.0–77.0)
Platelets: 175 10*3/uL (ref 150.0–400.0)
RBC: 4.54 Mil/uL (ref 3.87–5.11)
RDW: 14.2 % (ref 11.5–15.5)
WBC: 10 10*3/uL (ref 4.0–10.5)

## 2022-08-18 LAB — COMPREHENSIVE METABOLIC PANEL
ALT: 17 U/L (ref 0–35)
AST: 27 U/L (ref 0–37)
Albumin: 4.1 g/dL (ref 3.5–5.2)
Alkaline Phosphatase: 89 U/L (ref 39–117)
BUN: 11 mg/dL (ref 6–23)
CO2: 31 mEq/L (ref 19–32)
Calcium: 10 mg/dL (ref 8.4–10.5)
Chloride: 96 mEq/L (ref 96–112)
Creatinine, Ser: 0.67 mg/dL (ref 0.40–1.20)
GFR: 90.17 mL/min (ref >60.00)
Glucose, Bld: 94 mg/dL (ref 70–99)
Potassium: 3.6 mEq/L (ref 3.5–5.1)
Sodium: 139 mEq/L (ref 135–145)
Total Bilirubin: 0.6 mg/dL (ref 0.2–1.2)
Total Protein: 6.9 g/dL (ref 6.0–8.3)

## 2022-08-18 LAB — LIPID PANEL
Cholesterol: 156 mg/dL (ref 0–200)
HDL: 30.4 mg/dL — ABNORMAL LOW (ref >39.00)
NonHDL: 125.26
Total CHOL/HDL Ratio: 5
Triglycerides: 223 mg/dL — ABNORMAL HIGH (ref 0.0–149.0)
VLDL: 44.6 mg/dL — ABNORMAL HIGH (ref 0.0–40.0)

## 2022-08-18 LAB — TSH: TSH: 1.39 u[IU]/mL (ref 0.35–5.50)

## 2022-08-18 LAB — H. PYLORI ANTIBODY, IGG: H Pylori IgG: NEGATIVE

## 2022-08-18 MED ORDER — FAMOTIDINE 20 MG PO TABS: 20.0000 mg | ORAL_TABLET | Freq: Two times a day (BID) | ORAL | 3 refills | Status: AC

## 2022-08-18 NOTE — Assessment & Plan Note (Signed)
con't with fiber  Refer to GI

## 2022-08-18 NOTE — Progress Notes (Addendum)
Subjective:   By signing my name below, I, [name], attest that this documentation has been prepared under the direction and in the presence of [name], DO  08/18/2022     Patient ID: Kristin Delgado, female    DOB: 1955-11-25, 66 y.o.   MRN: [id number]  Chief Complaint  Patient presents with   Sore Throat    Pt states having pain with swallowing. Pt states sxs have been going on for years but has gotten worse over the summer. Pt states feels like foods get stuck.     HPI Patient is in today for an office visit. Patient is complaining of pain while swallowing for the past couple of years. The pain is worsen when drinking water. She noticed the pain after her pituitary gland surgery. She has been burping a lot because of swallowing pain and feels as though gas is trying to come up at the same time. Her current pain medication does not bring her relief. It takes her about an hour and half to eat a meal such as eggs and toast.  She is experiencing mid epigastric tenderness. She is experiencing constipation and diarrhea, but has taken Miralax for it which seems to have the symptoms under control.  She is experiencing auras about 2-3x a week that are relieved with tylenol after 30 minutes. She has a history of migraines that affected her vision in the past, but she is now having similar symptoms but with no headache. She is not interested in receiving the influenza vaccine this visit. She is not UTD on vision care. She has not recently had a colonoscopy done.   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   BRAIN SURGERY  1 / 1985   microadenoma   COCHLEAR IMPLANT  02/2013   Left side    Family History  Problem Relation Age of Onset   Lung cancer Mother    Cancer Mother 45       lung    Alzheimer's disease Father    Dementia Father     Social History   Socioeconomic History   Marital status: Married    Spouse name: Annie Main   Number of children: 3   Years of education: Not on file   Highest education level: Not on file  Occupational History   Occupation: Baker/Cook  Tobacco Use   Smoking status: Never   Smokeless tobacco: Never  Vaping Use   Vaping Use: Never used  Substance and Sexual Activity   Alcohol use: No    Comment: rare   Drug use: No   Sexual activity: Not Currently    Partners: Male  Other Topics Concern   Not on file  Social History Narrative   Exercise-- daily --- walking, 3x a week DVD   Social Determinants of Health   Financial Resource Strain: Low Risk  (  12/22/2021)   Overall Financial Resource Strain (CARDIA)    Difficulty of Paying Living Expenses: Not hard at all  Food Insecurity: No Food Insecurity (12/22/2021)   Hunger Vital Sign    Worried About Running Out of Food in the Last Year: Never true    Ran Out of Food in the Last Year: Never true  Transportation Needs: No Transportation Needs (12/22/2021)   PRAPARE - Hydrologist (Medical): No    Lack of Transportation (Non-Medical): No  Physical Activity: Inactive (12/22/2021)   Exercise Vital Sign    Days of Exercise per Week: 0 days    Minutes of Exercise per Session: 0 min  Stress: No Stress Concern Present (12/22/2021)   [place]    Feeling of Stress : Only a little  Social Connections: Moderately Integrated (12/22/2021)   Social Connection and Isolation Panel [NHANES]    Frequency of  Communication with Friends and Family: Twice a week    Frequency of Social Gatherings with Friends and Family: Once a week    Attends Religious Services: 1 to 4 times per year    Active Member of [employer] or Organizations: No    Attends Archivist Meetings: Never    Marital Status: Married  Human resources officer Violence: Not At Risk (12/22/2021)   Humiliation, Afraid, Rape, and Kick questionnaire    Fear of Current or Ex-Partner: No    Emotionally Abused: No    Physically Abused: No    Sexually Abused: No    Outpatient Medications Prior to Visit  Medication Sig Dispense Refill   acetaminophen (TYLENOL) 500 MG tablet Take 500-1,000 mg by mouth every 6 (six) hours as needed for headache (pain).     Ascorbic Acid (VITAMIN C PO) Take 1 tablet by mouth 2 (two) times daily.     atorvastatin (LIPITOR) 20 MG tablet Take 1 tablet (20 mg total) by mouth at bedtime. 90 tablet 0   B Complex-C (SUPER B COMPLEX PO) Take 1 tablet by mouth daily with lunch.     CALCIUM PO Take 1 tablet by mouth at bedtime.     chlorthalidone (HYGROTON) 25 MG tablet Take 1 tablet (25 mg total) by mouth daily. 90 tablet 0   ferrous sulfate 325 (65 FE) MG EC tablet Take 325 mg by mouth daily with lunch.     lisinopril (ZESTRIL) 10 MG tablet Take 1 tablet (10 mg total) by mouth at bedtime. 90 tablet 0   Multiple Vitamin (MULTIVITAMIN WITH MINERALS) TABS tablet Take 1 tablet by mouth daily with lunch.     Multiple Vitamins-Minerals (ZINC PO) Take 1 tablet by mouth at bedtime.     Omega-3 Fatty Acids (FISH OIL PO) Take 1 capsule by mouth 2 (two) times daily.     POTASSIUM PO Take 1 tablet by mouth at bedtime.     tiZANidine (ZANAFLEX) 4 MG tablet Take 0.5-1 tablets (2-4 mg total) by mouth every 6 (six) hours as needed for muscle spasms. 30 tablet 0   VITAMIN D PO Take 1 tablet by mouth daily.     Wheat Dextrin (BENEFIBER) POWD Take 15 mLs by mouth 2 (two) times daily.     No facility-administered medications prior to  visit.    No Known Allergies  Review of Systems  HENT:         (+)pain while swallowing  Eyes:        (+)aura  Gastrointestinal:  Positive for abdominal pain (mid-epigastric), constipation and diarrhea.       (+)burping frequently       Objective:    Physical Exam Constitutional:      General: She is not in acute distress.    Appearance: Normal appearance.  HENT:     Head: Normocephalic and atraumatic.     Right Ear: External ear normal.     Left Ear: External ear normal.  Eyes:     Extraocular Movements: Extraocular movements intact.     Right eye: Normal extraocular motion.     Pupils: Pupils are equal, round, and reactive to light.  Cardiovascular:     Rate and Rhythm: Normal rate and regular rhythm.     Heart sounds: Normal heart sounds. No murmur heard.    No gallop.  Pulmonary:     Effort: Pulmonary effort is normal. No respiratory distress.     Breath sounds: Normal breath sounds. No wheezing or rales.  Abdominal:     General: Bowel sounds are normal. There is no distension.     Palpations: Abdomen is soft.     Tenderness: There is abdominal tenderness in the epigastric area.  Skin:    General: Skin is warm and dry.  Neurological:     Mental Status: She is alert and oriented to person, place, and time.  Psychiatric:        Judgment: Judgment normal.     BP 118/80 (BP Location: Left Arm, Patient Position: Sitting, Cuff Size: Large)   Pulse 77   Temp 98.8 F (37.1 C) (Oral)   Resp 16   Ht '5\' 3"'$  (1.6 m)   Wt 172 lb 3.2 oz (78.1 kg)   LMP 06/10/2011   SpO2 97%   BMI 30.50 kg/m  Wt Readings from Last 3 Encounters:  08/18/22 172 lb 3.2 oz (78.1 kg)  08/04/22 171 lb (77.6 kg)  05/04/22 179 lb (81.2 kg)    Diabetic Foot Exam - Simple   No data filed    Lab Results  Component Value Date   WBC 5.7 02/18/2021   HGB 12.0 02/18/2021   HCT 37.3 02/18/2021   PLT 182.0 02/18/2021   GLUCOSE 103 (H) 08/21/2021   CHOL 234 (H) 08/21/2021   TRIG 371.0  (H) 08/21/2021   HDL 37.00 (L) 08/21/2021   LDLDIRECT 113.0 08/21/2021   LDLCALC 94 02/18/2021   ALT 18 08/21/2021   AST 17 08/21/2021   NA 140 08/21/2021   K 4.3 08/21/2021   CL 104 08/21/2021   CREATININE 0.78 08/21/2021   BUN 19 08/21/2021   CO2 31 08/21/2021   TSH 2.340 12/01/2019   HGBA1C 5.7 08/21/2021   MICROALBUR 0.8 08/21/2021    Lab Results  Component Value Date   TSH 2.340 12/01/2019   Lab Results  Component Value Date   WBC 5.7 02/18/2021   HGB 12.0 02/18/2021   HCT 37.3 02/18/2021   MCV 80.8 02/18/2021   PLT 182.0 02/18/2021   Lab Results  Component Value Date   NA 140 08/21/2021   K 4.3 08/21/2021   CO2 31 08/21/2021   GLUCOSE 103 (H) 08/21/2021   BUN 19 08/21/2021   CREATININE 0.78 08/21/2021   BILITOT 0.3 08/21/2021   ALKPHOS 61 08/21/2021   AST 17 08/21/2021   ALT 18 08/21/2021   PROT 6.4 08/21/2021   ALBUMIN 4.0 08/21/2021   CALCIUM 9.5 08/21/2021   ANIONGAP 10 03/07/2021   GFR 78.91 08/21/2021   Lab Results  Component  Value Date   CHOL 234 (H) 08/21/2021   Lab Results  Component Value Date   HDL 37.00 (L) 08/21/2021   Lab Results  Component Value Date   LDLCALC 94 02/18/2021   Lab Results  Component Value Date   TRIG 371.0 (H) 08/21/2021   Lab Results  Component Value Date   CHOLHDL 6 08/21/2021   Lab Results  Component Value Date   HGBA1C 5.7 08/21/2021       Assessment & Plan:   Problem List Items Addressed This Visit       Unprioritized   Hyperlipidemia   Relevant Orders   CBC with Differential/Platelet   Comprehensive metabolic panel   H. pylori antibody, IgG   Lipid panel   TSH   Essential hypertension   Relevant Orders   CBC with Differential/Platelet   Comprehensive metabolic panel   H. pylori antibody, IgG   Lipid panel   TSH   Other dysphagia    Suspect stricture  Refer to GI  pepcid bid         Irritable bowel syndrome with both constipation and diarrhea    con't with fiber  Refer to GI        Relevant Medications   famotidine (PEPCID) 20 MG tablet   Other Relevant Orders   Ambulatory referral to Gastroenterology   Other Visit Diagnoses     Dysphagia, unspecified type    -  Primary   Relevant Medications   famotidine (PEPCID) 20 MG tablet   Other Relevant Orders   Ambulatory referral to Gastroenterology   CBC with Differential/Platelet   Comprehensive metabolic panel   H. pylori antibody, IgG   Lipid panel   TSH       Meds ordered this encounter  Medications   famotidine (PEPCID) 20 MG tablet    Sig: Take 1 tablet (20 mg total) by mouth 2 (two) times daily.    Dispense:  60 tablet    Refill:  3    I, [name], DO, personally preformed the services described in this documentation.  All medical record entries made by the scribe were at my direction and in my presence.  I have reviewed the chart and discharge instructions (if applicable) and agree that the record reflects my personal performance and is accurate and complete. 08/18/2022   I,[name],acting as a scribe for [name], DO.,have documented all relevant documentation on the behalf of [name], DO,as directed by  [name], DO while in the presence of [name], DO.   [name], DO

## 2022-08-18 NOTE — Assessment & Plan Note (Signed)
Suspect stricture  Refer to GI  pepcid bid

## 2022-08-18 NOTE — Patient Instructions (Signed)
Esophageal Stricture  Esophageal stricture is a narrowing of the esophagus. The esophagus is the part of the body that moves food and liquid from your mouth to your stomach. The esophagus can become narrow because of disease or damage to the area. This condition can make swallowing difficult, painful, or even impossible. It also makes choking more likely. What are the causes? The most common cause of this condition is gastroesophageal reflux disease (GERD). Normally, food travels down the esophagus and stays in the stomach to be digested. In GERD, food and stomach acid move back up into the esophagus. Over time, this causes scar tissue and leads to narrowing. Other causes of esophageal stricture include: Scarring from swallowing a harmful substance. Damage from medical instruments used in the esophagus. Radiation therapy. Cancer. Inflammation of the esophagus. What increases the risk? You are more likely to develop an esophageal stricture if you have GERD or esophageal cancer. What are the signs or symptoms? Symptoms of this condition include: Difficulty swallowing. Pain when swallowing. Burning pain or discomfort in the throat or chest (heartburn). Vomiting or spitting up food or liquids. Unexplained weight loss. How is this diagnosed? This condition may be diagnosed based on: Your symptoms and a physical exam. Tests, such as: Upper endoscopy. Your health care provider will insert a flexible tube with a tiny camera on it (endoscope) into your esophagus to check for a stricture. A tissue sample may also be taken to be examined under a microscope (biopsy). Esophageal pH monitoring. This test involves using a tube to collect acid in the esophagus to determine how much stomach acid is entering the esophagus. Barium swallow test. For this test, you will drink a chalky liquid (barium solution) that coats the lining of the esophagus. Then you will have an X-ray taken. The barium solution helps to  show if there is a stricture. How is this treated? Treatment for esophageal stricture depends on what is causing your condition and how severe your condition is. Treatment options include: Esophageal dilation. In this procedure, a health care provider inserts an endoscope or a tool called a dilator into the esophagus to gently stretch it and make the opening wider. Stents. In some cases, a health care provider may place a small device (stent) in the esophagus to keep it open. Acid-blocking medicines. Taking these can help you manage GERD symptoms after an esophageal stricture. Controlling your GERD symptoms or being free of them can prevent the stricture from returning. Follow these instructions at home: Eating and drinking Follow instructions from your health care provider about eating or drinking restrictions. Cut your food into small pieces, chew well, and eat slowly. Try to eat soft food that is easier to swallow. Eat and drink only when you are sitting upright. Do not drink alcohol. If you need help quitting, ask your health care provider. Do not eat during the 3 hours before bedtime. Do not overeat at meals. Do not eat foods that can make reflux worse. These include: Fatty foods, such as red meat and processed foods. Spicy foods. Soda. Tomato products. Chocolate. General instructions Take over-the-counter and prescription medicines only as told by your health care provider. Do not use any products that contain nicotine or tobacco, such as cigarettes, e-cigarettes, and chewing tobacco. If you need help quitting, ask your health care provider. Lose weight if you are overweight. Wear loose, comfortable clothing. When lying in bed, raise your head with pillows. This will help to prevent your stomach contents from backing up into  your esophagus while you sleep. Keep all follow-up visits. This is important. Contact a health care provider if: You have problems eating or swallowing. You  vomit or spit up food and liquid. Your symptoms do not improve with treatment. Get help right away if: You can no longer keep down any food, drink, or your saliva. Summary Esophageal stricture is a narrowing of the part of the body that moves food and liquid from your mouth to your stomach (esophagus). The esophagus can become narrow because of disease or damage to the area. This can make swallowing difficult, painful, or even impossible. Treatment for esophageal stricture depends on what is causing your condition and how severe your condition is. In some cases, procedures may be done to make the opening of the esophagus wider or to place a stent in the esophagus to keep it open. Do not drink alcohol, overeat at meals, or eat foods that can make reflux worse. This information is not intended to replace advice given to you by your health care provider. Make sure you discuss any questions you have with your health care provider. Document Revised: 03/20/2020 Document Reviewed: 03/20/2020 Elsevier Patient Education  [place].

## 2022-08-19 LAB — LDL CHOLESTEROL, DIRECT: Direct LDL: 85 mg/dL

## 2022-08-20 ENCOUNTER — Other Ambulatory Visit (INDEPENDENT_AMBULATORY_CARE_PROVIDER_SITE_OTHER): Payer: Self-pay | Admitting: Family Medicine

## 2022-08-20 DIAGNOSIS — I10 Essential (primary) hypertension: Secondary | ICD-10-CM

## 2022-08-23 ENCOUNTER — Ambulatory Visit (INDEPENDENT_AMBULATORY_CARE_PROVIDER_SITE_OTHER): Payer: Medicare Other

## 2022-08-23 ENCOUNTER — Ambulatory Visit
Admission: EM | Admit: 2022-08-23 | Discharge: 2022-08-23 | Disposition: A | Discharge status: Home / Self Care | Payer: Medicare Other | Attending: Internal Medicine | Admitting: Internal Medicine

## 2022-08-23 DIAGNOSIS — R053 Chronic cough: Secondary | ICD-10-CM

## 2022-08-23 DIAGNOSIS — R0602 Shortness of breath: Secondary | ICD-10-CM | POA: Diagnosis not present

## 2022-08-23 DIAGNOSIS — R059 Cough, unspecified: Secondary | ICD-10-CM | POA: Diagnosis not present

## 2022-08-23 MED ORDER — ALBUTEROL SULFATE HFA 108 (90 BASE) MCG/ACT IN AERS: 1.0000 | INHALATION_SPRAY | Freq: Four times a day (QID) | RESPIRATORY_TRACT | 0 refills | Status: AC | PRN

## 2022-08-23 MED ORDER — BENZONATATE 100 MG PO CAPS: 100.0000 mg | ORAL_CAPSULE | Freq: Three times a day (TID) | ORAL | 0 refills | Status: AC | PRN

## 2022-08-23 MED ORDER — IPRATROPIUM-ALBUTEROL 0.5-2.5 (3) MG/3ML IN SOLN
3.0000 mL | Freq: Once | RESPIRATORY_TRACT | Status: AC
  Administered 2022-08-23: 3 mL via RESPIRATORY_TRACT

## 2022-08-23 NOTE — Discharge Instructions (Signed)
I have prescribed you 2 medications including an inhaler to help alleviate inflammation and shortness of breath.  Please use the inhaler about every 4-6 hours while awake for the next 24 hours and then as needed after that.  Follow-up if symptoms persist or worsen.

## 2022-08-23 NOTE — ED Triage Notes (Signed)
Pt c/o shallow breathing and dry cough onset a few weeks ago.

## 2022-08-23 NOTE — ED Provider Notes (Signed)
[facility]    CSN: [id number] Arrival date & time: 08/23/22  1238      History   Chief Complaint Chief Complaint  Patient presents with   Cough    HPI Kristin Delgado is a 66 y.o. female.   Patient presents with persistent cough for about 3 weeks as well as some intermittent shortness of breath.  Patient denies any associated upper respiratory symptoms, runny nose, fever.  Denies any known sick contacts.  Denies chest pain, sore throat, nausea, vomiting, diarrhea, abdominal pain.  Patient denies history of asthma or COPD.  Patient has not taken any medications over-the-counter to help alleviate symptoms.   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[place])    Vitamin D deficiency     Patient Active Problem List   Diagnosis Date Noted   Irritable bowel syndrome with both constipation and diarrhea 08/18/2022   Other dysphagia 08/04/2022   Type 2 diabetes mellitus with hyperglycemia, without long-term current use of insulin ([place]) 08/21/2021   Spinal stenosis of lumbar region 02/18/2021   Bulge of lumbar disc without myelopathy 02/18/2021   Left lumbar radiculopathy 01/09/2021   Suspicious nevus 08/20/2020   At risk for heart disease 11/01/2019   Vitamin D deficiency 11/01/2019   Insulin resistance 09/11/2019   Class 1 obesity with serious comorbidity and body mass index (BMI) of 30.0 to 30.9 in adult 09/11/2019   Pituitary adenoma ([place]) 01/30/2019   Arthritis of both hands 08/01/2018   Hyperlipidemia 07/15/2013   Sensorineural hearing loss, unilateral 03/07/2013   Cardiac murmur 06/14/2012   Depression  with anxiety 02/12/2011   Essential hypertension 03/08/2007    Past Surgical History:  Procedure Laterality Date   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 Authorizing Provider  albuterol (VENTOLIN HFA) 108 (90 Base) MCG/ACT inhaler Inhale 1-2 puffs into the lungs every 6 (six) hours as needed for wheezing or shortness of breath. 08/23/22  Yes Gavriela Cashin, Hildred Alamin E, FNP  benzonatate (TESSALON) 100 MG capsule Take 1 capsule (100 mg total) by mouth every 8 (eight) hours as needed for cough. 08/23/22  Yes Shadonna Benedick, Hildred Alamin E, FNP  acetaminophen (TYLENOL) 500 MG tablet Take 500-1,000 mg by mouth every 6 (six) hours as needed for headache (pain).    [provider]  Ascorbic  Acid (VITAMIN C PO) Take 1 tablet by mouth 2 (two) times daily.    [provider]  atorvastatin (LIPITOR) 20 MG tablet Take 1 tablet (20 mg total) by mouth at bedtime. 08/04/22   Dennard Nip D, MD  B Complex-C (SUPER B COMPLEX PO) Take 1 tablet by mouth daily with lunch.    [provider]  CALCIUM PO Take 1 tablet by mouth at bedtime.    [provider]  chlorthalidone (HYGROTON) 25 MG tablet Take 1 tablet (25 mg total) by mouth daily. 05/04/22   Dennard Nip D, MD  famotidine (PEPCID) 20 MG tablet Take 1 tablet (20 mg total) by mouth 2 (two) times daily. 08/18/22   Roma Schanz R, DO  ferrous sulfate 325 (65 FE) MG EC tablet Take 325 mg by mouth daily with lunch.    [provider]  lisinopril (ZESTRIL) 10 MG tablet Take 1 tablet (10 mg total) by mouth at bedtime. 05/04/22   Dennard Nip D, MD  Multiple Vitamin (MULTIVITAMIN  WITH MINERALS) TABS tablet Take 1 tablet by mouth daily with lunch.    [provider]  Multiple Vitamins-Minerals (ZINC PO) Take 1 tablet by mouth at bedtime.    [provider]  Omega-3 Fatty Acids (FISH OIL PO) Take 1 capsule by mouth 2 (two) times daily.    [provider]  POTASSIUM PO Take 1 tablet by mouth at bedtime.    [provider]  tiZANidine (ZANAFLEX) 4 MG tablet Take 0.5-1 tablets (2-4 mg total) by mouth every 6 (six) hours as needed for muscle spasms. 01/03/21   Wieters, Hallie C, PA-C  VITAMIN D PO Take 1 tablet by mouth daily.    [provider]  Wheat Dextrin (BENEFIBER) POWD Take 15 mLs by mouth 2 (two) times daily.    [provider]    Family History Family History  Problem Relation Age of Onset   Lung cancer Mother    Cancer Mother 37       lung    Alzheimer's disease Father    Dementia Father     Social History Social History   Tobacco Use   Smoking status: Never   Smokeless tobacco: Never  Vaping Use   Vaping Use: Never used  Substance Use Topics   Alcohol use: No    Comment: rare   Drug use: No     Allergies   Patient has no known allergies.   Review of Systems Review of Systems Per HPI  Physical Exam Triage Vital Signs ED Triage Vitals  Enc Vitals Group     BP 08/23/22 1344 (!) 155/73     Pulse Rate 08/23/22 1344 92     Resp 08/23/22 1344 16     Temp 08/23/22 1344 98.7 F (37.1 C)     Temp Source 08/23/22 1344 Oral     SpO2 08/23/22 1344 95 %     Weight --      Height --      Head Circumference --      Peak Flow --      Pain Score 08/23/22 1345 3     Pain Loc --      Pain Edu? --      Excl. in [place]? --    No data found.  Updated Vital Signs BP (!) 155/73 (BP Location: Left Arm)   Pulse 92   Temp 98.7 F (37.1 C) (Oral)   Resp 16   LMP 06/10/2011  SpO2 95%   Visual Acuity Right Eye Distance:   Left Eye Distance:   Bilateral Distance:    Right Eye Near:   Left  Eye Near:    Bilateral Near:     Physical Exam Constitutional:      General: She is not in acute distress.    Appearance: Normal appearance. She is not toxic-appearing or diaphoretic.  HENT:     Head: Normocephalic and atraumatic.     Right Ear: Tympanic membrane and ear canal normal.     Left Ear: Tympanic membrane and ear canal normal.     Nose: No congestion.     Mouth/Throat:     Mouth: Mucous membranes are moist.     Pharynx: No posterior oropharyngeal erythema.  Eyes:     Extraocular Movements: Extraocular movements intact.     Conjunctiva/sclera: Conjunctivae normal.     Pupils: Pupils are equal, round, and reactive to light.  Cardiovascular:     Rate and Rhythm: Normal rate and regular rhythm.     Pulses: Normal pulses.     Heart sounds: Normal heart sounds.  Pulmonary:     Effort: Pulmonary effort is normal. No respiratory distress.     Breath sounds: Normal breath sounds. No stridor. No wheezing, rhonchi or rales.  Abdominal:     General: Abdomen is flat. Bowel sounds are normal.     Palpations: Abdomen is soft.  Musculoskeletal:        General: Normal range of motion.     Cervical back: Normal range of motion.  Skin:    General: Skin is warm and dry.  Neurological:     General: No focal deficit present.     Mental Status: She is alert and oriented to person, place, and time. Mental status is at baseline.  Psychiatric:        Mood and Affect: Mood normal.        Behavior: Behavior normal.      UC Treatments / Results  Labs (all labs ordered are listed, but only abnormal results are displayed) Labs Reviewed - No data to display  EKG   Radiology DG Chest 2 View  Result Date: 08/23/2022 CLINICAL DATA:  Short of breath. Shallow breathing and dry cough for a few days. EXAM: CHEST - 2 VIEW COMPARISON:  11/25/2019. FINDINGS: Cardiac silhouette is normal in size. No mediastinal or hilar masses. No evidence of adenopathy. Minor linear scarring or atelectasis at  the left lung base. Lungs otherwise clear. No pleural effusion or pneumothorax. Skeletal structures are intact. IMPRESSION: No active cardiopulmonary disease. Electronically Signed   By: Lajean Manes M.D.   On: 08/23/2022 14:16    Procedures Procedures (including critical care time)  Medications Ordered in UC Medications  ipratropium-albuterol (DUONEB) 0.5-2.5 (3) MG/3ML nebulizer solution 3 mL (3 mLs Nebulization Given 08/23/22 1421)    Initial Impression / Assessment and Plan / UC Course  I have reviewed the triage vital signs and the nursing notes.  Pertinent labs & imaging results that were available during my care of the patient were reviewed by me and considered in my medical decision making (see chart for details).     Chest x-ray is showing minor scarring versus atelectasis in the left lung base which could be contributing to patient's symptoms or may be incidental finding.  There are no signs of secondary bacterial infection, community-acquired pneumonia, bronchitis that is obvious on x-ray.  Suspect the patient has a viral persistent cough that is causing  inflammation and intermittent shortness of breath.  There are no other signs of respiratory distress, tachypnea, and oxygen and vital signs are normal.  DuoNeb was administered in urgent care today and patient stated that she felt much better after this.  Will prescribe albuterol inhaler as well as benzonatate to take as needed for cough.  Do not think viral testing is necessary given duration of symptoms and would not change treatment.  Patient was given very strict return and ER precautions.  Patient verbalized understanding and was agreeable with plan. Final Clinical Impressions(s) / UC Diagnoses   Final diagnoses:  Persistent cough for 3 weeks or longer  Shortness of breath     Discharge Instructions      I have prescribed you 2 medications including an inhaler to help alleviate inflammation and shortness of breath.   Please use the inhaler about every 4-6 hours while awake for the next 24 hours and then as needed after that.  Follow-up if symptoms persist or worsen.    ED Prescriptions     Medication Sig Dispense Auth. Provider   albuterol (VENTOLIN HFA) 108 (90 Base) MCG/ACT inhaler Inhale 1-2 puffs into the lungs every 6 (six) hours as needed for wheezing or shortness of breath. 1 each Birdsong, [place] E, [place]   benzonatate (TESSALON) 100 MG capsule Take 1 capsule (100 mg total) by mouth every 8 (eight) hours as needed for cough. 21 capsule Starbuck, Michele Rockers, [place]      PDMP not reviewed this encounter.   Teodora Medici, [place] 08/23/22 1454

## 2022-08-27 ENCOUNTER — Encounter: Payer: Self-pay | Admitting: Emergency Medicine

## 2022-08-27 ENCOUNTER — Ambulatory Visit
Admission: EM | Admit: 2022-08-27 | Discharge: 2022-08-27 | Disposition: A | Discharge status: Home / Self Care | Payer: Medicare Other | Attending: Family Medicine | Admitting: Family Medicine

## 2022-08-27 DIAGNOSIS — R634 Abnormal weight loss: Secondary | ICD-10-CM

## 2022-08-27 DIAGNOSIS — J0100 Acute maxillary sinusitis, unspecified: Secondary | ICD-10-CM

## 2022-08-27 DIAGNOSIS — R1310 Dysphagia, unspecified: Secondary | ICD-10-CM

## 2022-08-27 DIAGNOSIS — R110 Nausea: Secondary | ICD-10-CM

## 2022-08-27 MED ORDER — ONDANSETRON 4 MG PO TBDP: 4.0000 mg | ORAL_TABLET | Freq: Three times a day (TID) | ORAL | 0 refills | Status: AC | PRN

## 2022-08-27 MED ORDER — CEFDINIR 300 MG PO CAPS: 300.0000 mg | ORAL_CAPSULE | Freq: Two times a day (BID) | ORAL | 0 refills | Status: AC

## 2022-08-27 NOTE — ED Triage Notes (Addendum)
Pt is present today with c/o cough and nasal congestion. Pt sx started one month ago Pt states she takes OTC allergy medication and it helps most of the time   Pt also states that she noticed a bruise on her left eye lid this morning. Pt  states that she noticed numbness on the left side of her face. Pt denies any recent injury

## 2022-08-30 ENCOUNTER — Inpatient Hospital Stay (HOSPITAL_COMMUNITY)
Admission: EM | Admit: 2022-08-30 | Discharge: 2022-09-16 | DRG: 025 | Disposition: E | Payer: Medicare Other | Attending: Pulmonary Disease | Admitting: Pulmonary Disease

## 2022-08-30 ENCOUNTER — Other Ambulatory Visit: Payer: Self-pay

## 2022-08-30 ENCOUNTER — Emergency Department (HOSPITAL_COMMUNITY): Payer: Medicare Other

## 2022-08-30 ENCOUNTER — Inpatient Hospital Stay (HOSPITAL_COMMUNITY): Payer: Medicare Other

## 2022-08-30 ENCOUNTER — Encounter (HOSPITAL_COMMUNITY): Admission: EM | Disposition: E | Payer: Self-pay | Source: Home / Self Care | Attending: Pulmonary Disease

## 2022-08-30 ENCOUNTER — Encounter (HOSPITAL_COMMUNITY): Payer: Self-pay

## 2022-08-30 ENCOUNTER — Inpatient Hospital Stay (HOSPITAL_COMMUNITY): Payer: Medicare Other | Admitting: Anesthesiology

## 2022-08-30 DIAGNOSIS — G928 Other toxic encephalopathy: Secondary | ICD-10-CM | POA: Diagnosis present

## 2022-08-30 DIAGNOSIS — D352 Benign neoplasm of pituitary gland: Secondary | ICD-10-CM | POA: Diagnosis present

## 2022-08-30 DIAGNOSIS — I615 Nontraumatic intracerebral hemorrhage, intraventricular: Secondary | ICD-10-CM | POA: Diagnosis not present

## 2022-08-30 DIAGNOSIS — I6203 Nontraumatic chronic subdural hemorrhage: Secondary | ICD-10-CM | POA: Diagnosis not present

## 2022-08-30 DIAGNOSIS — R531 Weakness: Secondary | ICD-10-CM | POA: Diagnosis present

## 2022-08-30 DIAGNOSIS — G932 Benign intracranial hypertension: Secondary | ICD-10-CM | POA: Diagnosis not present

## 2022-08-30 DIAGNOSIS — E119 Type 2 diabetes mellitus without complications: Secondary | ICD-10-CM | POA: Diagnosis not present

## 2022-08-30 DIAGNOSIS — E1149 Type 2 diabetes mellitus with other diabetic neurological complication: Secondary | ICD-10-CM | POA: Diagnosis not present

## 2022-08-30 DIAGNOSIS — E876 Hypokalemia: Secondary | ICD-10-CM | POA: Diagnosis not present

## 2022-08-30 DIAGNOSIS — M7989 Other specified soft tissue disorders: Secondary | ICD-10-CM | POA: Diagnosis not present

## 2022-08-30 DIAGNOSIS — J9601 Acute respiratory failure with hypoxia: Secondary | ICD-10-CM | POA: Diagnosis present

## 2022-08-30 DIAGNOSIS — R410 Disorientation, unspecified: Secondary | ICD-10-CM | POA: Diagnosis present

## 2022-08-30 DIAGNOSIS — H532 Diplopia: Secondary | ICD-10-CM | POA: Diagnosis not present

## 2022-08-30 DIAGNOSIS — E785 Hyperlipidemia, unspecified: Secondary | ICD-10-CM | POA: Diagnosis present

## 2022-08-30 DIAGNOSIS — I2699 Other pulmonary embolism without acute cor pulmonale: Secondary | ICD-10-CM | POA: Diagnosis not present

## 2022-08-30 DIAGNOSIS — G936 Cerebral edema: Secondary | ICD-10-CM | POA: Diagnosis not present

## 2022-08-30 DIAGNOSIS — G40901 Epilepsy, unspecified, not intractable, with status epilepticus: Secondary | ICD-10-CM | POA: Diagnosis present

## 2022-08-30 DIAGNOSIS — Z515 Encounter for palliative care: Secondary | ICD-10-CM

## 2022-08-30 DIAGNOSIS — F419 Anxiety disorder, unspecified: Secondary | ICD-10-CM | POA: Diagnosis present

## 2022-08-30 DIAGNOSIS — S065XAA Traumatic subdural hemorrhage with loss of consciousness status unknown, initial encounter: Secondary | ICD-10-CM | POA: Diagnosis present

## 2022-08-30 DIAGNOSIS — G935 Compression of brain: Secondary | ICD-10-CM | POA: Diagnosis present

## 2022-08-30 DIAGNOSIS — K219 Gastro-esophageal reflux disease without esophagitis: Secondary | ICD-10-CM | POA: Diagnosis present

## 2022-08-30 DIAGNOSIS — R1310 Dysphagia, unspecified: Secondary | ICD-10-CM | POA: Diagnosis present

## 2022-08-30 DIAGNOSIS — S066X0A Traumatic subarachnoid hemorrhage without loss of consciousness, initial encounter: Secondary | ICD-10-CM | POA: Diagnosis not present

## 2022-08-30 DIAGNOSIS — I10 Essential (primary) hypertension: Secondary | ICD-10-CM

## 2022-08-30 DIAGNOSIS — Z801 Family history of malignant neoplasm of trachea, bronchus and lung: Secondary | ICD-10-CM | POA: Diagnosis not present

## 2022-08-30 DIAGNOSIS — D696 Thrombocytopenia, unspecified: Secondary | ICD-10-CM | POA: Diagnosis present

## 2022-08-30 DIAGNOSIS — S0012XA Contusion of left eyelid and periocular area, initial encounter: Secondary | ICD-10-CM | POA: Diagnosis not present

## 2022-08-30 DIAGNOSIS — F32A Depression, unspecified: Secondary | ICD-10-CM | POA: Diagnosis present

## 2022-08-30 DIAGNOSIS — R739 Hyperglycemia, unspecified: Secondary | ICD-10-CM | POA: Diagnosis not present

## 2022-08-30 DIAGNOSIS — Z9621 Cochlear implant status: Secondary | ICD-10-CM | POA: Diagnosis present

## 2022-08-30 DIAGNOSIS — D62 Acute posthemorrhagic anemia: Secondary | ICD-10-CM | POA: Diagnosis not present

## 2022-08-30 DIAGNOSIS — Z9911 Dependence on respirator [ventilator] status: Secondary | ICD-10-CM | POA: Diagnosis not present

## 2022-08-30 DIAGNOSIS — R404 Transient alteration of awareness: Secondary | ICD-10-CM | POA: Diagnosis not present

## 2022-08-30 DIAGNOSIS — R0689 Other abnormalities of breathing: Secondary | ICD-10-CM | POA: Diagnosis not present

## 2022-08-30 DIAGNOSIS — I6200 Nontraumatic subdural hemorrhage, unspecified: Secondary | ICD-10-CM | POA: Diagnosis not present

## 2022-08-30 DIAGNOSIS — G9389 Other specified disorders of brain: Secondary | ICD-10-CM | POA: Diagnosis not present

## 2022-08-30 DIAGNOSIS — I611 Nontraumatic intracerebral hemorrhage in hemisphere, cortical: Secondary | ICD-10-CM | POA: Diagnosis not present

## 2022-08-30 DIAGNOSIS — R9431 Abnormal electrocardiogram [ECG] [EKG]: Secondary | ICD-10-CM | POA: Diagnosis not present

## 2022-08-30 DIAGNOSIS — R569 Unspecified convulsions: Secondary | ICD-10-CM | POA: Diagnosis not present

## 2022-08-30 DIAGNOSIS — D649 Anemia, unspecified: Secondary | ICD-10-CM | POA: Diagnosis not present

## 2022-08-30 DIAGNOSIS — Z66 Do not resuscitate: Secondary | ICD-10-CM | POA: Diagnosis not present

## 2022-08-30 DIAGNOSIS — J341 Cyst and mucocele of nose and nasal sinus: Secondary | ICD-10-CM | POA: Diagnosis not present

## 2022-08-30 DIAGNOSIS — G9349 Other encephalopathy: Secondary | ICD-10-CM | POA: Diagnosis present

## 2022-08-30 DIAGNOSIS — F418 Other specified anxiety disorders: Secondary | ICD-10-CM | POA: Diagnosis not present

## 2022-08-30 DIAGNOSIS — D3610 Benign neoplasm of peripheral nerves and autonomic nervous system, unspecified: Secondary | ICD-10-CM | POA: Diagnosis present

## 2022-08-30 DIAGNOSIS — Z4682 Encounter for fitting and adjustment of non-vascular catheter: Secondary | ICD-10-CM | POA: Diagnosis not present

## 2022-08-30 DIAGNOSIS — Z79899 Other long term (current) drug therapy: Secondary | ICD-10-CM

## 2022-08-30 DIAGNOSIS — Z820 Family history of epilepsy and other diseases of the nervous system: Secondary | ICD-10-CM

## 2022-08-30 DIAGNOSIS — Z818 Family history of other mental and behavioral disorders: Secondary | ICD-10-CM

## 2022-08-30 DIAGNOSIS — R0902 Hypoxemia: Secondary | ICD-10-CM | POA: Diagnosis not present

## 2022-08-30 DIAGNOSIS — R55 Syncope and collapse: Secondary | ICD-10-CM | POA: Diagnosis not present

## 2022-08-30 DIAGNOSIS — Z86711 Personal history of pulmonary embolism: Secondary | ICD-10-CM

## 2022-08-30 HISTORY — PX: BURR HOLE: SHX908

## 2022-08-30 LAB — BASIC METABOLIC PANEL
Anion gap: 16 — ABNORMAL HIGH (ref 5–15)
BUN: 21 mg/dL (ref 8–23)
CO2: 20 mmol/L — ABNORMAL LOW (ref 22–32)
Calcium: 8.9 mg/dL (ref 8.9–10.3)
Chloride: 110 mmol/L (ref 98–111)
Creatinine, Ser: 0.65 mg/dL (ref 0.44–1.00)
GFR, Estimated: >60 mL/min (ref >60)
Glucose, Bld: 176 mg/dL — ABNORMAL HIGH (ref 70–99)
Potassium: 3.7 mmol/L (ref 3.5–5.1)
Sodium: 146 mmol/L — ABNORMAL HIGH (ref 135–145)

## 2022-08-30 LAB — CBC WITH DIFFERENTIAL/PLATELET
Abs Immature Granulocytes: 0.49 10*3/uL — ABNORMAL HIGH (ref 0.00–0.07)
Basophils Absolute: 0.1 10*3/uL (ref 0.0–0.1)
Basophils Relative: 1 %
Eosinophils Absolute: 0.2 10*3/uL (ref 0.0–0.5)
Eosinophils Relative: 2 %
HCT: 37.9 % (ref 36.0–46.0)
Hemoglobin: 11.9 g/dL — ABNORMAL LOW (ref 12.0–15.0)
Immature Granulocytes: 5 %
Lymphocytes Relative: 11 %
Lymphs Abs: 1.1 10*3/uL (ref 0.7–4.0)
MCH: 25 pg — ABNORMAL LOW (ref 26.0–34.0)
MCHC: 31.4 g/dL (ref 30.0–36.0)
MCV: 79.6 fL — ABNORMAL LOW (ref 80.0–100.0)
Monocytes Absolute: 0.6 10*3/uL (ref 0.1–1.0)
Monocytes Relative: 6 %
Neutro Abs: 7.7 10*3/uL (ref 1.7–7.7)
Neutrophils Relative %: 75 %
Platelets: 128 10*3/uL — ABNORMAL LOW (ref 150–400)
RBC: 4.76 MIL/uL (ref 3.87–5.11)
RDW: 13.8 % (ref 11.5–15.5)
WBC: 10.2 10*3/uL (ref 4.0–10.5)
nRBC: 0.6 % — ABNORMAL HIGH (ref 0.0–0.2)

## 2022-08-30 LAB — URINALYSIS, ROUTINE W REFLEX MICROSCOPIC
Bilirubin Urine: NEGATIVE
Glucose, UA: NEGATIVE mg/dL
Hgb urine dipstick: NEGATIVE
Ketones, ur: NEGATIVE mg/dL
Leukocytes,Ua: NEGATIVE
Nitrite: NEGATIVE
Protein, ur: NEGATIVE mg/dL
Specific Gravity, Urine: 1.023 (ref 1.005–1.030)
pH: 5 (ref 5.0–8.0)

## 2022-08-30 LAB — COMPREHENSIVE METABOLIC PANEL
ALT: 28 U/L (ref 0–44)
AST: 53 U/L — ABNORMAL HIGH (ref 15–41)
Albumin: 2.8 g/dL — ABNORMAL LOW (ref 3.5–5.0)
Alkaline Phosphatase: 120 U/L (ref 38–126)
Anion gap: 11 (ref 5–15)
BUN: 22 mg/dL (ref 8–23)
CO2: 29 mmol/L (ref 22–32)
Calcium: 9.7 mg/dL (ref 8.9–10.3)
Chloride: 102 mmol/L (ref 98–111)
Creatinine, Ser: 0.67 mg/dL (ref 0.44–1.00)
GFR, Estimated: >60 mL/min (ref >60)
Glucose, Bld: 119 mg/dL — ABNORMAL HIGH (ref 70–99)
Potassium: 4 mmol/L (ref 3.5–5.1)
Sodium: 142 mmol/L (ref 135–145)
Total Bilirubin: 1 mg/dL (ref 0.3–1.2)
Total Protein: 6.3 g/dL — ABNORMAL LOW (ref 6.5–8.1)

## 2022-08-30 LAB — I-STAT ARTERIAL BLOOD GAS, ED
Acid-Base Excess: 2 mmol/L (ref 0.0–2.0)
Bicarbonate: 25.5 mmol/L (ref 20.0–28.0)
Calcium, Ion: 1.28 mmol/L (ref 1.15–1.40)
HCT: 32 % — ABNORMAL LOW (ref 36.0–46.0)
Hemoglobin: 10.9 g/dL — ABNORMAL LOW (ref 12.0–15.0)
O2 Saturation: 94 %
Potassium: 3 mmol/L — ABNORMAL LOW (ref 3.5–5.1)
Sodium: 144 mmol/L (ref 135–145)
TCO2: 27 mmol/L (ref 22–32)
pCO2 arterial: 33.1 mmHg (ref 32–48)
pH, Arterial: 7.495 — ABNORMAL HIGH (ref 7.35–7.45)
pO2, Arterial: 63 mmHg — ABNORMAL LOW (ref 83–108)

## 2022-08-30 LAB — ABO/RH: ABO/RH(D): AB POS

## 2022-08-30 LAB — CBC
HCT: 37.1 % (ref 36.0–46.0)
Hemoglobin: 11.6 g/dL — ABNORMAL LOW (ref 12.0–15.0)
MCH: 24.6 pg — ABNORMAL LOW (ref 26.0–34.0)
MCHC: 31.3 g/dL (ref 30.0–36.0)
MCV: 78.8 fL — ABNORMAL LOW (ref 80.0–100.0)
Platelets: 91 10*3/uL — ABNORMAL LOW (ref 150–400)
RBC: 4.71 MIL/uL (ref 3.87–5.11)
RDW: 13.9 % (ref 11.5–15.5)
WBC: 11.3 10*3/uL — ABNORMAL HIGH (ref 4.0–10.5)
nRBC: 0.3 % — ABNORMAL HIGH (ref 0.0–0.2)

## 2022-08-30 LAB — CBG MONITORING, ED: Glucose-Capillary: 120 mg/dL — ABNORMAL HIGH (ref 70–99)

## 2022-08-30 LAB — I-STAT CHEM 8, ED
BUN: 31 mg/dL — ABNORMAL HIGH (ref 8–23)
Calcium, Ion: 1.18 mmol/L (ref 1.15–1.40)
Chloride: 104 mmol/L (ref 98–111)
Creatinine, Ser: 0.6 mg/dL (ref 0.44–1.00)
Glucose, Bld: 116 mg/dL — ABNORMAL HIGH (ref 70–99)
HCT: 36 % (ref 36.0–46.0)
Hemoglobin: 12.2 g/dL (ref 12.0–15.0)
Potassium: 4.1 mmol/L (ref 3.5–5.1)
Sodium: 141 mmol/L (ref 135–145)
TCO2: 30 mmol/L (ref 22–32)

## 2022-08-30 LAB — RAPID URINE DRUG SCREEN, HOSP PERFORMED
Amphetamines: NOT DETECTED
Barbiturates: NOT DETECTED
Benzodiazepines: NOT DETECTED
Cocaine: NOT DETECTED
Opiates: NOT DETECTED
Tetrahydrocannabinol: NOT DETECTED

## 2022-08-30 LAB — PROTIME-INR
INR: 1.5 — ABNORMAL HIGH (ref 0.8–1.2)
Prothrombin Time: 17.8 seconds — ABNORMAL HIGH (ref 11.4–15.2)

## 2022-08-30 LAB — TYPE AND SCREEN
ABO/RH(D): AB POS
Antibody Screen: NEGATIVE

## 2022-08-30 LAB — TSH: TSH: 1.927 u[IU]/mL (ref 0.350–4.500)

## 2022-08-30 LAB — TROPONIN I (HIGH SENSITIVITY)
Troponin I (High Sensitivity): 22 ng/L — ABNORMAL HIGH (ref <18)
Troponin I (High Sensitivity): 36 ng/L — ABNORMAL HIGH (ref <18)

## 2022-08-30 LAB — MRSA NEXT GEN BY PCR, NASAL: MRSA by PCR Next Gen: NOT DETECTED

## 2022-08-30 LAB — HIV ANTIBODY (ROUTINE TESTING W REFLEX): HIV Screen 4th Generation wRfx: NONREACTIVE

## 2022-08-30 LAB — LACTIC ACID, PLASMA
Lactic Acid, Venous: 1.7 mmol/L (ref 0.5–1.9)
Lactic Acid, Venous: 1.2 mmol/L (ref 0.5–1.9)

## 2022-08-30 LAB — SALICYLATE LEVEL: Salicylate Lvl: <7 mg/dL — ABNORMAL LOW (ref 7.0–30.0)

## 2022-08-30 LAB — ETHANOL: Alcohol, Ethyl (B): <10 mg/dL (ref <10)

## 2022-08-30 SURGERY — CREATION, CRANIAL BURR HOLE
Anesthesia: General | Site: Head | Laterality: Left

## 2022-08-30 MED ORDER — POLYETHYLENE GLYCOL 3350 17 G PO PACK: 17.0000 g | PACK | Freq: Every day | ORAL | Status: DC | PRN

## 2022-08-30 MED ORDER — DOCUSATE SODIUM 100 MG PO CAPS: 100.0000 mg | ORAL_CAPSULE | Freq: Two times a day (BID) | ORAL | Status: DC | PRN

## 2022-08-30 MED ORDER — MANNITOL 25 % IV SOLN
25.0000 g | Freq: Once | INTRAVENOUS | Status: AC
  Administered 2022-08-30: 25 g via INTRAVENOUS
  Filled 2022-08-30: qty 100

## 2022-08-30 MED ORDER — THROMBIN 5000 UNITS EX SOLR
CUTANEOUS | Status: AC
  Filled 2022-08-30: qty 5000

## 2022-08-30 MED ORDER — LACTATED RINGERS IV BOLUS: 1000.0000 mL | Freq: Once | INTRAVENOUS | Status: DC

## 2022-08-30 MED ORDER — LABETALOL HCL 5 MG/ML IV SOLN
20.0000 mg | Freq: Once | INTRAVENOUS | Status: AC
  Administered 2022-08-30: 20 mg via INTRAVENOUS
  Filled 2022-08-30: qty 4

## 2022-08-30 MED ORDER — FENTANYL CITRATE PF 50 MCG/ML IJ SOSY
25.0000 ug | PREFILLED_SYRINGE | INTRAMUSCULAR | Status: DC | PRN
  Administered 2022-08-31: 25 ug via INTRAVENOUS
  Filled 2022-08-30: qty 1

## 2022-08-30 MED ORDER — CHLORTHALIDONE 25 MG PO TABS
25.0000 mg | ORAL_TABLET | Freq: Every day | ORAL | Status: DC
  Filled 2022-08-30: qty 1

## 2022-08-30 MED ORDER — PROPOFOL 1000 MG/100ML IV EMUL
0.0000 ug/kg/min | INTRAVENOUS | Status: DC
  Administered 2022-08-30: 20 ug/kg/min via INTRAVENOUS
  Administered 2022-08-30: 75 ug/kg/min via INTRAVENOUS
  Administered 2022-08-31: 20 ug/kg/min via INTRAVENOUS
  Administered 2022-08-31: 40 ug/kg/min via INTRAVENOUS
  Filled 2022-08-30 (×3): qty 100

## 2022-08-30 MED ORDER — LIDOCAINE-EPINEPHRINE 1 %-1:100000 IJ SOLN
INTRAMUSCULAR | Status: DC | PRN
  Administered 2022-08-30: 6 mL

## 2022-08-30 MED ORDER — CLEVIDIPINE BUTYRATE 0.5 MG/ML IV EMUL
0.0000 mg/h | INTRAVENOUS | Status: DC
  Administered 2022-08-30: 2 mg/h via INTRAVENOUS
  Filled 2022-08-30 (×2): qty 50

## 2022-08-30 MED ORDER — PHENYLEPHRINE 80 MCG/ML (10ML) SYRINGE FOR IV PUSH (FOR BLOOD PRESSURE SUPPORT)
PREFILLED_SYRINGE | INTRAVENOUS | Status: DC | PRN
  Administered 2022-08-30 (×2): 80 ug via INTRAVENOUS

## 2022-08-30 MED ORDER — THROMBIN 20000 UNITS EX SOLR
CUTANEOUS | Status: AC
  Filled 2022-08-30: qty 20000

## 2022-08-30 MED ORDER — CEFAZOLIN SODIUM 1 G IJ SOLR
INTRAMUSCULAR | Status: AC
  Filled 2022-08-30: qty 40

## 2022-08-30 MED ORDER — THROMBIN 20000 UNITS EX SOLR: CUTANEOUS | Status: DC | PRN

## 2022-08-30 MED ORDER — PHENYLEPHRINE 80 MCG/ML (10ML) SYRINGE FOR IV PUSH (FOR BLOOD PRESSURE SUPPORT)
PREFILLED_SYRINGE | INTRAVENOUS | Status: AC
  Filled 2022-08-30: qty 10

## 2022-08-30 MED ORDER — BACITRACIN ZINC 500 UNIT/GM EX OINT
TOPICAL_OINTMENT | CUTANEOUS | Status: AC
  Filled 2022-08-30: qty 28.35

## 2022-08-30 MED ORDER — LIDOCAINE-EPINEPHRINE 1 %-1:100000 IJ SOLN
INTRAMUSCULAR | Status: AC
  Filled 2022-08-30: qty 1

## 2022-08-30 MED ORDER — DEXAMETHASONE SODIUM PHOSPHATE 10 MG/ML IJ SOLN
INTRAMUSCULAR | Status: AC
  Filled 2022-08-30: qty 1

## 2022-08-30 MED ORDER — CEFDINIR 250 MG/5ML PO SUSR
300.0000 mg | Freq: Two times a day (BID) | ORAL | Status: DC
  Administered 2022-08-30: 300 mg
  Filled 2022-08-30 (×3): qty 6

## 2022-08-30 MED ORDER — LIDOCAINE 2% (20 MG/ML) 5 ML SYRINGE
INTRAMUSCULAR | Status: AC
  Filled 2022-08-30: qty 5

## 2022-08-30 MED ORDER — PROPOFOL 10 MG/ML IV BOLUS
INTRAVENOUS | Status: DC | PRN
  Administered 2022-08-30: 50 mg via INTRAVENOUS

## 2022-08-30 MED ORDER — FENTANYL CITRATE (PF) 250 MCG/5ML IJ SOLN
INTRAMUSCULAR | Status: AC
  Filled 2022-08-30: qty 5

## 2022-08-30 MED ORDER — DOCUSATE SODIUM 50 MG/5ML PO LIQD: 100.0000 mg | Freq: Two times a day (BID) | ORAL | Status: DC | PRN

## 2022-08-30 MED ORDER — FENTANYL CITRATE (PF) 250 MCG/5ML IJ SOLN
INTRAMUSCULAR | Status: DC | PRN
  Administered 2022-08-30: 100 ug via INTRAVENOUS

## 2022-08-30 MED ORDER — ACETAMINOPHEN 325 MG PO TABS: 650.0000 mg | ORAL_TABLET | ORAL | Status: DC | PRN

## 2022-08-30 MED ORDER — CEFAZOLIN SODIUM-DEXTROSE 2-3 GM-%(50ML) IV SOLR
INTRAVENOUS | Status: DC | PRN
  Administered 2022-08-30: 2 g via INTRAVENOUS

## 2022-08-30 MED ORDER — ORAL CARE MOUTH RINSE: 15.0000 mL | OROMUCOSAL | Status: DC | PRN

## 2022-08-30 MED ORDER — DEXAMETHASONE SODIUM PHOSPHATE 10 MG/ML IJ SOLN
INTRAMUSCULAR | Status: DC | PRN
  Administered 2022-08-30: 8 mg via INTRAVENOUS

## 2022-08-30 MED ORDER — LISINOPRIL 10 MG PO TABS
10.0000 mg | ORAL_TABLET | Freq: Every day | ORAL | Status: DC
  Administered 2022-08-30: 10 mg
  Filled 2022-08-30: qty 1

## 2022-08-30 MED ORDER — DOCUSATE SODIUM 50 MG/5ML PO LIQD
100.0000 mg | Freq: Two times a day (BID) | ORAL | Status: DC
  Administered 2022-08-30: 100 mg
  Filled 2022-08-30: qty 10

## 2022-08-30 MED ORDER — PANTOPRAZOLE 2 MG/ML SUSPENSION
40.0000 mg | Freq: Every day | ORAL | Status: DC
  Filled 2022-08-30: qty 20

## 2022-08-30 MED ORDER — SODIUM CHLORIDE 0.9 % IV SOLN: INTRAVENOUS | Status: DC | PRN

## 2022-08-30 MED ORDER — THROMBIN 5000 UNITS EX SOLR: OROMUCOSAL | Status: DC | PRN

## 2022-08-30 MED ORDER — ROCURONIUM BROMIDE 50 MG/5ML IV SOLN
INTRAVENOUS | Status: AC | PRN
  Administered 2022-08-30: 90 mg via INTRAVENOUS

## 2022-08-30 MED ORDER — PANTOPRAZOLE SODIUM 40 MG IV SOLR
40.0000 mg | Freq: Every day | INTRAVENOUS | Status: DC
  Administered 2022-08-30: 40 mg via INTRAVENOUS
  Filled 2022-08-30: qty 10

## 2022-08-30 MED ORDER — LISINOPRIL 10 MG PO TABS: 10.0000 mg | ORAL_TABLET | Freq: Every day | ORAL | Status: DC

## 2022-08-30 MED ORDER — PROPOFOL 1000 MG/100ML IV EMUL
INTRAVENOUS | Status: AC
  Filled 2022-08-30: qty 100

## 2022-08-30 MED ORDER — ONDANSETRON HCL 4 MG/2ML IJ SOLN
4.0000 mg | Freq: Four times a day (QID) | INTRAMUSCULAR | Status: DC | PRN
  Administered 2022-09-01: 4 mg via INTRAVENOUS
  Filled 2022-08-30: qty 2

## 2022-08-30 MED ORDER — 0.9 % SODIUM CHLORIDE (POUR BTL) OPTIME
TOPICAL | Status: DC | PRN
  Administered 2022-08-30: 3000 mL

## 2022-08-30 MED ORDER — NALOXONE HCL 2 MG/2ML IJ SOSY
PREFILLED_SYRINGE | INTRAMUSCULAR | Status: AC | PRN
  Administered 2022-08-30: 1 mg via INTRAVENOUS

## 2022-08-30 MED ORDER — PROPOFOL 500 MG/50ML IV EMUL
INTRAVENOUS | Status: AC | PRN
  Administered 2022-08-30: 10.376 ug/kg/min via INTRAVENOUS

## 2022-08-30 MED ORDER — SODIUM CHLORIDE 3 % IV BOLUS
250.0000 mL | Freq: Once | INTRAVENOUS | Status: AC
  Administered 2022-08-30: 250 mL via INTRAVENOUS
  Filled 2022-08-30: qty 500

## 2022-08-30 MED ORDER — ROCURONIUM BROMIDE 10 MG/ML (PF) SYRINGE
PREFILLED_SYRINGE | INTRAVENOUS | Status: DC | PRN
  Administered 2022-08-30: 50 mg via INTRAVENOUS
  Administered 2022-08-30: 30 mg via INTRAVENOUS

## 2022-08-30 MED ORDER — ETOMIDATE 2 MG/ML IV SOLN
INTRAVENOUS | Status: AC | PRN
  Administered 2022-08-30: 20 mg via INTRAVENOUS

## 2022-08-30 MED ORDER — ATORVASTATIN CALCIUM 10 MG PO TABS: 20.0000 mg | ORAL_TABLET | Freq: Every day | ORAL | Status: DC

## 2022-08-30 MED ORDER — POLYETHYLENE GLYCOL 3350 17 G PO PACK: 17.0000 g | PACK | Freq: Every day | ORAL | Status: DC

## 2022-08-30 MED ORDER — ONDANSETRON HCL 4 MG/2ML IJ SOLN
INTRAMUSCULAR | Status: DC | PRN
  Administered 2022-08-30: 4 mg via INTRAVENOUS

## 2022-08-30 MED ORDER — PHENYLEPHRINE HCL-NACL 20-0.9 MG/250ML-% IV SOLN
INTRAVENOUS | Status: DC | PRN
  Administered 2022-08-30: 20 ug/min via INTRAVENOUS

## 2022-08-30 MED ORDER — PROPOFOL 10 MG/ML IV BOLUS
INTRAVENOUS | Status: AC
  Filled 2022-08-30: qty 20

## 2022-08-30 MED ORDER — FENTANYL CITRATE PF 50 MCG/ML IJ SOSY: 25.0000 ug | PREFILLED_SYRINGE | INTRAMUSCULAR | Status: DC | PRN

## 2022-08-30 MED ORDER — CHLORHEXIDINE GLUCONATE CLOTH 2 % EX PADS
6.0000 | MEDICATED_PAD | Freq: Every day | CUTANEOUS | Status: DC
  Administered 2022-08-30 – 2022-09-05 (×7): 6 via TOPICAL

## 2022-08-30 MED ORDER — ORAL CARE MOUTH RINSE
15.0000 mL | OROMUCOSAL | Status: DC
  Administered 2022-08-30 – 2022-08-31 (×9): 15 mL via OROMUCOSAL

## 2022-08-30 SURGICAL SUPPLY — 41 items
ADH SKN CLS APL DERMABOND .7 (GAUZE/BANDAGES/DRESSINGS) ×1
BAG COUNTER SPONGE SURGICOUNT (BAG) ×2 IMPLANT
BAG SPNG CNTER NS LX DISP (BAG) ×2
BLADE CLIPPER SURG (BLADE) IMPLANT
BLADE SURG 11 STRL SS (BLADE) ×2 IMPLANT
BNDG GAUZE DERMACEA FLUFF 4 (GAUZE/BANDAGES/DRESSINGS) IMPLANT
BNDG GZE DERMACEA 4 6PLY (GAUZE/BANDAGES/DRESSINGS)
BUR ACORN 9.0 PRECISION (BURR) ×2 IMPLANT
CANISTER SUCT 3000ML PPV (MISCELLANEOUS) ×2 IMPLANT
DERMABOND ADVANCED .7 DNX12 (GAUZE/BANDAGES/DRESSINGS) ×2 IMPLANT
DRAIN JACKSON PRT FLT 7MM (DRAIN) IMPLANT
DRAPE NEUROLOGICAL W/INCISE (DRAPES) IMPLANT
DRAPE WARM FLUID 44X44 (DRAPES) ×2 IMPLANT
DRSG OPSITE POSTOP 4X6 (GAUZE/BANDAGES/DRESSINGS) IMPLANT
ELECT REM PT RETURN 9FT ADLT (ELECTROSURGICAL) ×1
ELECTRODE REM PT RTRN 9FT ADLT (ELECTROSURGICAL) ×2 IMPLANT
EVACUATOR SILICONE 100CC (DRAIN) IMPLANT
GLOVE BIO SURGEON STRL SZ7 (GLOVE) IMPLANT
GLOVE BIO SURGEON STRL SZ8 (GLOVE) ×2 IMPLANT
GLOVE BIOGEL PI IND STRL 7.0 (GLOVE) IMPLANT
GLOVE INDICATOR 8.5 STRL (GLOVE) ×4 IMPLANT
GOWN STRL REUS W/ TWL LRG LVL3 (GOWN DISPOSABLE) ×2 IMPLANT
GOWN STRL REUS W/ TWL XL LVL3 (GOWN DISPOSABLE) ×2 IMPLANT
GOWN STRL REUS W/TWL 2XL LVL3 (GOWN DISPOSABLE) ×2 IMPLANT
GOWN STRL REUS W/TWL LRG LVL3 (GOWN DISPOSABLE) ×2
GOWN STRL REUS W/TWL XL LVL3 (GOWN DISPOSABLE) ×2
HEMOSTAT POWDER KIT SURGIFOAM (HEMOSTASIS) IMPLANT
HEMOSTAT SURGICEL 2X14 (HEMOSTASIS) IMPLANT
KIT BASIN OR (CUSTOM PROCEDURE TRAY) ×2 IMPLANT
KIT TURNOVER KIT B (KITS) ×2 IMPLANT
NDL HYPO 25X1 1.5 SAFETY (NEEDLE) ×2 IMPLANT
NEEDLE HYPO 25X1 1.5 SAFETY (NEEDLE) ×1 IMPLANT
NS IRRIG 1000ML POUR BTL (IV SOLUTION) ×2 IMPLANT
PACK CRANIOTOMY CUSTOM (CUSTOM PROCEDURE TRAY) ×2 IMPLANT
PAD ARMBOARD 7.5X6 YLW CONV (MISCELLANEOUS) ×6 IMPLANT
STAPLER VISISTAT 35W (STAPLE) ×2 IMPLANT
SUT NURALON 4 0 TR CR/8 (SUTURE) ×4 IMPLANT
SUT VIC AB 2-0 CT1 18 (SUTURE) ×2 IMPLANT
TOWEL GREEN STERILE (TOWEL DISPOSABLE) ×2 IMPLANT
TOWEL GREEN STERILE FF (TOWEL DISPOSABLE) ×2 IMPLANT
WATER STERILE IRR 1000ML POUR (IV SOLUTION) ×2 IMPLANT

## 2022-08-30 NOTE — Anesthesia Preprocedure Evaluation (Addendum)
Anesthesia Evaluation  Patient identified by MRN, date of birth, ID band Patient unresponsive    Reviewed: Allergy & Precautions, Unable to perform ROS - Chart review onlyPreop documentation limited or incomplete due to emergent nature of procedure.  Airway Mallampati: Intubated       Dental   Pulmonary    breath sounds clear to auscultation       Cardiovascular hypertension, Pt. on medications  Rhythm:Regular  1. Left ventricular ejection fraction, by visual estimation, is 60 to  65%. The left ventricle has normal function. There is no left ventricular  hypertrophy.  2. The left ventricle has no regional wall motion abnormalities.  3. Normal GLS -22.  4. Global right ventricle has normal systolic function.The right  ventricular size is normal. No increase in right ventricular wall  thickness.  5. Left atrial size was normal.  6. Right atrial size was normal.  7. The mitral valve is normal in structure. Trivial mitral valve  regurgitation. No evidence of mitral stenosis.  8. The tricuspid valve is normal in structure.  9. The tricuspid valve is normal in structure. Tricuspid valve  regurgitation is not demonstrated.  10. The aortic valve is tricuspid. Aortic valve regurgitation is mild.  Mild aortic valve sclerosis without stenosis.  11. The pulmonic valve was normal in structure. Pulmonic valve  regurgitation is trivial.  12. Aortic dilatation noted.  13. There is mild dilatation of the aortic root measuring 41 mm.  14. Mildly elevated pulmonary artery systolic pressure.  15. The inferior vena cava is normal in size with greater than 50%  respiratory variability, suggesting right atrial pressure of 3 mmHg.    Neuro/Psych PSYCHIATRIC DISORDERS Anxiety Depression  Neuromuscular disease    GI/Hepatic Neg liver ROS, GERD  ,  Endo/Other  diabetes  Renal/GU negative Renal ROSLab Results      Component                 Value               Date                      CREATININE               0.60                09/12/2022                Musculoskeletal  (+) Arthritis ,   Abdominal   Peds  Hematology  (+) Blood dyscrasia, anemia , Lab Results      Component                Value               Date                      WBC                      10.2                09/08/2022                HGB                      10.9 (L)            08/28/2022  HCT                      32.0 (L)            08/25/2022                MCV                      79.6 (L)            09/08/2022                PLT                      128 (L)             09/13/2022              Anesthesia Other Findings Left Subdural with shift  Reproductive/Obstetrics                            Anesthesia Physical Anesthesia Plan  ASA: 5 and emergent  Anesthesia Plan: General   Post-op Pain Management:    Induction: Inhalational  PONV Risk Score and Plan: 3 and Ondansetron and Treatment may vary due to age or medical condition  Airway Management Planned: Oral ETT  Additional Equipment: Arterial line  Intra-op Plan:   Post-operative Plan: Post-operative intubation/ventilation  Informed Consent:     History available from chart only and Only emergency history available  Plan Discussed with: CRNA and Surgeon  Anesthesia Plan Comments:         Anesthesia Quick Evaluation
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
## 2022-08-30 NOTE — H&P (Signed)
Kristin Delgado is an 66 y.o. female.   Chief Complaint: Unresponsiveness HPI: 66 year old female whose had several weeks of progressive decline in mental status and functional ability however was found unresponsive today for unknown period of time no known history of falls does have a history of pituitary adenoma and hearing implant and hypertension.  All history was obtained from the husband.  Patient brought to ER was evaluated known to have a dilated pupil was intubated CT scan showed large left-sided subdural fluid collection patient was referred to [name].  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 She has never used smokeless tobacco. She reports that she does not drink alcohol and does not use drugs.  Allergies: No Known Allergies  (Not in a hospital admission)   Results for orders placed or performed during the hospital encounter of 08/31/2022 (from the past 48 hour(s))  CBG monitoring, ED     Status: Abnormal   Collection Time: 08/29/2022 11:31 AM  Result Value Ref Range   Glucose-Capillary 120 (H) 70 - 99 mg/dL    Comment: Glucose reference range applies only to samples taken after fasting for at least 8 hours.  Comprehensive  metabolic panel     Status: Abnormal   Collection Time: 09/01/2022 11:35 AM  Result Value Ref Range   Sodium 142 135 - 145 mmol/L   Potassium 4.0 3.5 - 5.1 mmol/L    Comment: HEMOLYSIS AT THIS LEVEL MAY AFFECT RESULT   Chloride 102 98 - 111 mmol/L   CO2 29 22 - 32 mmol/L   Glucose, Bld 119 (H) 70 - 99 mg/dL    Comment: Glucose reference range applies only to samples taken after fasting for at least 8 hours.   BUN 22 8 - 23 mg/dL   Creatinine, Ser 0.67 0.44 - 1.00 mg/dL   Calcium 9.7 8.9 - 10.3 mg/dL   Total Protein 6.3 (L) 6.5 - 8.1 g/dL   Albumin 2.8 (L) 3.5 - 5.0 g/dL   AST 53 (H) 15 - 41 U/L    Comment: HEMOLYSIS AT THIS LEVEL MAY AFFECT RESULT   ALT 28 0 - 44 U/L    Comment: HEMOLYSIS AT THIS LEVEL MAY AFFECT RESULT   Alkaline Phosphatase 120 38 - 126 U/L   Total Bilirubin 1.0 0.3 - 1.2 mg/dL    Comment: HEMOLYSIS AT THIS LEVEL MAY AFFECT RESULT   GFR, Estimated >60 >60 mL/min    Comment: (NOTE) Calculated using the CKD-EPI  Creatinine Equation (2021)    Anion gap 11 5 - 15    Comment: Performed at [facility], [place] [address] [place], [place] [address]  Lactic acid, plasma     Status: None   Collection Time: 08/16/2022 11:35 AM  Result Value Ref Range   Lactic Acid, Venous 1.7 0.5 - 1.9 mmol/L    Comment: Performed at [facility] [address], [place], [place] [address]  CBC with Differential     Status: Abnormal   Collection Time: 09/07/2022 11:35 AM  Result Value Ref Range   WBC 10.2 4.0 - 10.5 K/uL   RBC 4.76 3.87 - 5.11 MIL/uL   Hemoglobin 11.9 (L) 12.0 - 15.0 g/dL   HCT 37.9 36.0 - 46.0 %   MCV 79.6 (L) 80.0 - 100.0 fL   MCH 25.0 (L) 26.0 - 34.0 pg   MCHC 31.4 30.0 - 36.0 g/dL   RDW 13.8 11.5 - 15.5 %   Platelets 128 (L) 150 - 400 K/uL   nRBC 0.6 (H) 0.0 - 0.2 %   Neutrophils Relative % 75 %   Neutro Abs 7.7 1.7 - 7.7 K/uL   Lymphocytes Relative 11 %   Lymphs Abs 1.1 0.7 - 4.0 K/uL   Monocytes Relative 6 %   Monocytes Absolute 0.6 0.1 - 1.0 K/uL    Eosinophils Relative 2 %   Eosinophils Absolute 0.2 0.0 - 0.5 K/uL   Basophils Relative 1 %   Basophils Absolute 0.1 0.0 - 0.1 K/uL   Immature Granulocytes 5 %   Abs Immature Granulocytes 0.49 (H) 0.00 - 0.07 K/uL    Comment: Performed at [facility], [place] [address] [place], [place] [address]  Protime-INR     Status: Abnormal   Collection Time: 08/27/2022 11:35 AM  Result Value Ref Range   Prothrombin Time 17.8 (H) 11.4 - 15.2 seconds   INR 1.5 (H) 0.8 - 1.2    Comment: (NOTE) INR goal varies based on device and disease states. Performed at [facility], [place] [address], [place], [place] [address]   Troponin I (High Sensitivity)     Status: Abnormal   Collection Time: 09/13/2022 11:35 AM  Result Value Ref Range   Troponin I (High Sensitivity) 22 (H) <18 ng/L    Comment: (NOTE) Elevated high sensitivity troponin I (hsTnI) values and significant  changes across serial measurements may suggest ACS but many other  chronic and acute conditions are known to elevate hsTnI results.  Refer to the "Links" section for chest pain algorithms and additional  guidance. Performed at [facility], [place] [address] [place], [place] [address]   TSH     Status: None   Collection Time: 09/11/2022 11:35 AM  Result Value Ref Range   TSH 1.927 0.350 - 4.500 uIU/mL    Comment: Performed by a 3rd Generation assay with a functional sensitivity of <=0.01 uIU/mL. Performed at [facility], [place] [address], [place], [place] [address]   ABO/Rh     Status: None   Collection Time: 09/01/2022 11:35 AM  Result Value Ref Range   ABO/RH(D)      AB POS Performed at [facility] [address], [place],  [address]   I-stat chem 8, ED (not at [facility] or [facility])     Status: Abnormal   Collection Time: 09/04/2022 11:43 AM  Result Value Ref Range   Sodium 141 135 - 145 mmol/L  Potassium 4.1 3.5 - 5.1 mmol/L   Chloride 104 98 - 111 mmol/L   BUN 31 (H) 8 - 23 mg/dL   Creatinine, Ser  0.60 0.44 - 1.00 mg/dL   Glucose, Bld 116 (H) 70 - 99 mg/dL    Comment: Glucose reference range applies only to samples taken after fasting for at least 8 hours.   Calcium, Ion 1.18 1.15 - 1.40 mmol/L   TCO2 30 22 - 32 mmol/L   Hemoglobin 12.2 12.0 - 15.0 g/dL   HCT 36.0 36.0 - 46.0 %  Urinalysis, Routine w reflex microscopic Urine, In & Out Cath     Status: Abnormal   Collection Time: 08/29/2022 12:34 PM  Result Value Ref Range   Color, Urine YELLOW YELLOW   APPearance CLOUDY (A) CLEAR   Specific Gravity, Urine 1.023 1.005 - 1.030   pH 5.0 5.0 - 8.0   Glucose, UA NEGATIVE NEGATIVE mg/dL   Hgb urine dipstick NEGATIVE NEGATIVE   Bilirubin Urine NEGATIVE NEGATIVE   Ketones, ur NEGATIVE NEGATIVE mg/dL   Protein, ur NEGATIVE NEGATIVE mg/dL   Nitrite NEGATIVE NEGATIVE   Leukocytes,Ua NEGATIVE NEGATIVE    Comment: Performed at [facility], [place] [address] [place], [place] [address]   CT Head Wo Contrast  Addendum Date: 08/28/2022   ADDENDUM REPORT: 09/04/2022 12:24 ADDENDUM: Critical Value/emergent results were called by telephone at the time of interpretation on 09/07/2022 at 1217 hours to Dr. Ezequiel Essex , who verbally acknowledged these results. Electronically Signed   By: Genevie Ann M.D.   On: 09/02/2022 12:24   Result Date: 08/19/2022 CLINICAL DATA:  66 year old female with increasing lethargy, currently unresponsive and blown pupil. EXAM: CT HEAD WITHOUT CONTRAST TECHNIQUE: Contiguous axial images were obtained from the base of the skull through the vertex without intravenous contrast. RADIATION DOSE REDUCTION: This exam was performed according to the departmental dose-optimization program which includes automated exposure control, adjustment of the mA and/or kV according to patient size and/or use of iterative reconstruction technique. COMPARISON:  Brain MRI 03/12/2011. FINDINGS: Brain: Large volume hypodense left side subdural hematoma, maxilla mall along the left anterior  frontal convexity and up to 26 mm thickness there (series 5, image 20 and series 3, image 18. There is a left para falcine component also, with a small volume of layering hyperdense blood there. Intracranial mass effect with rightward midline shift up to 18 mm. Trapped right lateral ventricle with mild temporal horn enlargement and transependymal edema. Subfalcine and left uncal herniation (series 5, image 33). Effaced suprasellar and interpeduncular cisterns. No tonsillar herniation, cisterna magna is patent. No other cerebral edema. No cortically based acute infarct identified. No other intracranial hemorrhage. Vascular: No suspicious intracranial vascular hyperdensity. Skull: Previous left mastoidectomy and mastoid cranioplasty, new since 2012. There is a left occipital bone anchor in place on series 4, image 24. No acute osseous abnormality identified. Sinuses/Orbits: Previous mastoidectomy and mastoid cranioplasty. Partially opacified mastoid cavity with fluid. Maxillary sinus mucous retention cysts and moderate sphenoid sinus mucoperiosteal thickening. Similar mucoperiosteal thickening at the left frontoethmoidal recess. Other: No acute orbit or scalp soft tissue finding. Intubated on the scout view. IMPRESSION: 1. Large volume low-density Left Side Subdural Hematoma up to 26 mm thickness. 2. Severe intracranial mass effect with rightward midline shift up to 18 mm, subfalcine and left uncal herniation. Trapped right lateral ventricle with some transependymal edema. 3. No associated acute infarct identified. 4. Previous left mastoidectomy and mastoid cranioplasty, and nearby left occipital bone anchor in  place. 5. Paranasal sinus inflammation. Electronically Signed: By: Genevie Ann M.D. On: 08/24/2022 12:12   CT Cervical Spine Wo Contrast  Result Date: 08/31/2022 CLINICAL DATA:  66 year old female with increasing lethargy, currently unresponsive and blown pupil. Large left side subdural hematoma. EXAM: CT  CERVICAL SPINE WITHOUT CONTRAST TECHNIQUE: Multidetector CT imaging of the cervical spine was performed without intravenous contrast. Multiplanar CT image reconstructions were also generated. RADIATION DOSE REDUCTION: This exam was performed according to the departmental dose-optimization program which includes automated exposure control, adjustment of the mA and/or kV according to patient size and/or use of iterative reconstruction technique. COMPARISON:  CT head and face today. FINDINGS: Alignment: Straightening of cervical lordosis. Cervicothoracic junction alignment is within normal limits. Bilateral posterior element alignment is within normal limits. Skull base and vertebrae: Partially visible left mastoidectomy in cranioplasty, detailed separately. Elsewhere the visible skull base appears intact. C1 and C2 appear intact and aligned. No acute osseous abnormality identified. Soft tissues and spinal canal: No prevertebral fluid or swelling. No visible canal hematoma. Intubated, and the ET tube courses appropriately into the subglottic trachea. Fluid in the pharynx. Disc levels: Lower cervical bulky disc and endplate degeneration W2-N5 and C6-C7. Associated mild cervical spinal stenosis. Upper chest: Mild dependent atelectasis in the lung apices. Endotracheal tube within the subglottic trachea. Grossly intact visible upper thoracic levels. IMPRESSION: 1. No acute traumatic injury identified in the cervical spine. Lower cervical spine disc and endplate degeneration with suspected mild cervical spinal stenosis. 2. Intubated, and ET tube courses appropriately into the trachea. Electronically Signed   By: Genevie Ann M.D.   On: 09/15/2022 12:24   CT Maxillofacial Wo Contrast  Result Date: 09/02/2022 CLINICAL DATA:  66 year old female with increasing lethargy, currently unresponsive and blown pupil. Large left side subdural hematoma. EXAM: CT MAXILLOFACIAL WITHOUT CONTRAST TECHNIQUE: Multidetector CT imaging of the  maxillofacial structures was performed. Multiplanar CT image reconstructions were also generated. RADIATION DOSE REDUCTION: This exam was performed according to the departmental dose-optimization program which includes automated exposure control, adjustment of the mA and/or kV according to patient size and/or use of iterative reconstruction technique. COMPARISON:  CT head and cervical spine today reported separately. FINDINGS: Osseous: Bilateral TMJ degeneration. Mandible intact and located. Maxilla, zygoma, pterygoid, and nasal bones intact. Central skull base intact, with previous left mastoidectomy and cranioplasty there, further described on head CT today. Orbits: Intact orbital walls. Orbits soft tissues appears symmetric and negative. Sinuses: Previous left mastoidectomy with partially opacified surgical cavity with fluid and bubbly opacity. Left tympanic portion of the cavity remains relatively well aerated. Right tympanic cavity and mastoids are clear. Moderate left frontoethmoidal and bilateral sphenoid mucoperiosteal thickening. Bilateral maxillary sinus retention cysts. Soft tissues: Intubated. Endotracheal tube courses appropriately toward the airway at the larynx. Fluid in the pharynx. Otherwise negative visible noncontrast deep soft tissue spaces of the face. Limited intracranial: Intracranial mass effect from left side extra-axial collection is reported separately today. IMPRESSION: 1. No acute facial fracture identified. 2. Intubated with satisfactory visible endotracheal tube. 3. Chronic appearing paranasal sinus disease. Previous left mastoidectomy and cranioplasty. Electronically Signed   By: Genevie Ann M.D.   On: 08/27/2022 12:17    Review of Systems  Unable to perform ROS: Intubated    Blood pressure 134/66, pulse 72, resp. rate (!) 30, height '5\' 3"'$  (1.6 m), weight 77.1 kg, last menstrual period 06/10/2011, SpO2 96 %. Physical Exam Cardiovascular:     Rate and Rhythm: Normal rate.   Pulmonary:  Effort: Pulmonary effort is normal.  Abdominal:     General: Abdomen is flat.  Neurological:     Comments: Patient intubated left pupil dilated minimally reactive 6-5 right pupil reactive spontaneous movement left arm and leg right hemiparesis.  Evidence of ecchymosis around the left eye consistent with some trauma could be a couple days old      Assessment/Plan 66 year old with large chronic appearing left-sided subdural with 2 cm midline shift and herniation.  We have recommended bur hole possible craniotomy for evacuation of left-sided subdural fluid collection.  I have extensively gone over the risks and benefits of the operation with the patient's husband as well as perioperative course expectations of outcome and alternatives of surgery and he understood and agreed to proceed forward.  Elaina Hoops, MD 09/05/2022, 1:32 PM

## 2022-08-30 NOTE — Anesthesia Postprocedure Evaluation (Signed)
Anesthesia Post Note  Patient: Kristin Delgado  Procedure(s) Performed: Trudee Kuster HOLE CRANIOTOMY FOR SUBDURAL HEMATOMA (Left: Head)     Patient location during evaluation: ICU Anesthesia Type: General Level of consciousness: sedated Pain management: pain level controlled Vital Signs Assessment: post-procedure vital signs reviewed and stable Respiratory status: respiratory function stable and patient remains intubated per anesthesia plan Cardiovascular status: blood pressure returned to baseline and stable Postop Assessment: no apparent nausea or vomiting Anesthetic complications: no   No notable events documented.  Last Vitals:  Vitals:   08/28/2022 2030 08/19/2022 2045  BP:    Pulse: 82   Resp: (!) 9 (!) 9  Temp:    SpO2: 100%     Last Pain:  Vitals:   09/15/2022 2000  TempSrc: Axillary  PainSc:                  Jlee Harkless

## 2022-08-30 NOTE — Anesthesia Procedure Notes (Signed)
Arterial Line Insertion Start/End10/07/2022 2:10 PM, 08/27/2022 2:20 PM Performed by: Oleta Mouse, MD, anesthesiologist  Patient location: OR. Preanesthetic checklist: patient identified, IV checked, site marked, risks and benefits discussed, surgical consent, monitors and equipment checked, pre-op evaluation, timeout performed and anesthesia consent Patient sedated Left, radial was placed Catheter size: 20 G Hand hygiene performed  and maximum sterile barriers used  Allen's test indicative of satisfactory collateral circulation Attempts: 3 Procedure performed using ultrasound guided technique. Ultrasound Notes:anatomy identified, needle tip was noted to be adjacent to the nerve/plexus identified and no ultrasound evidence of intravascular and/or intraneural injection Following insertion, dressing applied and Biopatch. Post procedure complications: local hematoma, unsuccessful attempts and second provider assisted. Patient tolerated the procedure well with no immediate complications.

## 2022-08-30 NOTE — Op Note (Signed)
Preoperative diagnosis: Left-sided chronic subdural hematoma.  Postoperative diagnosis: Same.  Procedure: Left-sided bur hole craniectomy for evacuation of chronic subdural hematoma.  Surgeon: Kary Kos.  Assistant: Nash Shearer.  Anesthesia: General.  EBL: Less than 50.  PI: 66 year old female has had a couple months of declining mental status but acutely decompensated today became unresponsive.  Work-up revealed large chronic subdural with severe mass effect patient presented to the ER unresponsive patient was intubated and we were consulted.  Identified large chronic subdural hematoma with 2 cm midline shift recommended bur hole possible craniotomy for evacuation.  We extensively went over the risks and benefits of the procedure with the patient's husband as well as perioperative course expectations of outcome and alternatives of surgery and he understood and agreed to proceed forward.  Operative procedure: Patient was brought into the OR was induced under general anesthesia positioned supine shoulder bump on the left shoulder head turned the right exposing the left frontal parietal lobes.  After adequate shaving and prepping of her the left side of her head 2 incisions were drawn out along the superior temporal line 1 frontally 1 parietally these were infiltrated with 6 cc lidocaine with epi incisions were made bur holes were drilled both frontally and parietally dura was then coagulated and incised in a cruciate fashion and immediately old chronic fluid under pressure came out especially out of the frontal bur hole cortex was easily visualized in the defect the frontal lobe definitely had not completely reexpanded and filled the cortex were clearly visualized we then irrigated from the frontal to the parietal through a red rubber catheter making sure that all the irrigant was clear we then placed a JP drain and closed both incisions with interrupted Vicryl and screening irrigation and  immediately prior to closure to try to minimize the amount of pneumocephalus.  Skin was stapled head was dressed and patient was remitted to the ICU in stable condition.  At the end the case all needle count sponge counts were correct.

## 2022-08-30 NOTE — ED Notes (Signed)
Left pupil is blown 5

## 2022-08-30 NOTE — ED Notes (Addendum)
Called report to Lubertha South RN. OR team at bedside to transport pt to OR with belongings. Husband accompanied pt to OR.

## 2022-08-30 NOTE — Sedation Documentation (Signed)
No response to Narcan

## 2022-08-30 NOTE — ED Provider Notes (Signed)
[facility] Provider Note   CSN: [id number] Arrival date & time: 09/02/2022  1123     History  No chief complaint on file.   Kristin Delgado is a 66 y.o. female.  Level 5 caveat for acuity of condition.  Patient brought in by ambulance from home with "unresponsiveness".  EMS reports she has been progressively "lethargic" over the past 2 to 3 days.  Family unable to wake her up this morning or get her out of bed.  Last seen normal was Thursday, October 12.  On arrival patient is obtunded, responds to pain briefly but does not speak and has shallow respirations.  Intermittently follows some commands.  Patient unable to give any history.  No family at bedside.  She has ecchymosis to her left eye as well as asymmetric pupils.  High concern for intracranial hemorrhage.  Husband has arrived.  He is not aware of any falls.  Patient has been seen by both her PCP and urgent care this week for upper respiratory infection and possible sinusitis.  She is prescribed cefdinir  The history is provided by the patient and the EMS personnel. The history is limited by the condition of the patient.       Home Medications Prior to Admission medications   Medication Sig Start Date End Date Taking? Authorizing Provider  acetaminophen (TYLENOL) 500 MG tablet Take 500-1,000 mg by mouth every 6 (six) hours as needed for headache (pain).    [provider]  albuterol (VENTOLIN HFA) 108 (90 Base) MCG/ACT inhaler Inhale 1-2 puffs into the lungs every 6 (six) hours as needed for wheezing or shortness of breath. 08/23/22   Teodora Medici, FNP  Ascorbic Acid (VITAMIN C PO) Take 1 tablet by mouth 2 (two) times daily.    [provider]  atorvastatin (LIPITOR) 20 MG tablet Take 1 tablet (20 mg total) by mouth at bedtime. 08/04/22   Dennard Nip D, MD  B Complex-C (SUPER B COMPLEX PO) Take 1 tablet by mouth daily with lunch.    [provider]  benzonatate  (TESSALON) 100 MG capsule Take 1 capsule (100 mg total) by mouth every 8 (eight) hours as needed for cough. 08/23/22   Teodora Medici, FNP  CALCIUM PO Take 1 tablet by mouth at bedtime.    [provider]  cefdinir (OMNICEF) 300 MG capsule Take 1 capsule (300 mg total) by mouth 2 (two) times daily. 08/27/22   Vanessa Kick, MD  chlorthalidone (HYGROTON) 25 MG tablet Take 1 tablet (25 mg total) by mouth daily. 05/04/22   Dennard Nip D, MD  famotidine (PEPCID) 20 MG tablet Take 1 tablet (20 mg total) by mouth 2 (two) times daily. 08/18/22   Roma Schanz R, DO  ferrous sulfate 325 (65 FE) MG EC tablet Take 325 mg by mouth daily with lunch.    [provider]  lisinopril (ZESTRIL) 10 MG tablet Take 1 tablet (10 mg total) by mouth at bedtime. 05/04/22   Dennard Nip D, MD  Multiple Vitamin (MULTIVITAMIN WITH MINERALS) TABS tablet Take 1 tablet by mouth daily with lunch.    [provider]  Multiple Vitamins-Minerals (ZINC PO) Take 1 tablet by mouth at bedtime.    [provider]  Omega-3 Fatty Acids (FISH OIL PO) Take 1 capsule by mouth 2 (two) times daily.    [provider]  ondansetron (ZOFRAN-ODT) 4 MG disintegrating tablet Take 1 tablet (4 mg total) by mouth every 8 (  eight) hours as needed for nausea or vomiting. 08/27/22   Vanessa Kick, MD  POTASSIUM PO Take 1 tablet by mouth at bedtime.    [provider]  tiZANidine (ZANAFLEX) 4 MG tablet Take 0.5-1 tablets (2-4 mg total) by mouth every 6 (six) hours as needed for muscle spasms. 01/03/21   Wieters, Hallie C, PA-C  VITAMIN D PO Take 1 tablet by mouth daily.    [provider]  Wheat Dextrin (BENEFIBER) POWD Take 15 mLs by mouth 2 (two) times daily.    [provider]      Allergies    Patient has no known allergies.    Review of Systems   Review of Systems  Unable to perform ROS: Acuity of condition    Physical Exam Updated Vital Signs BP (!) 160/84 (BP  Location: Left Arm)   Pulse 80   Resp (!) 36   Ht '5\' 3"'$  (1.6 m)   Wt 77.1 kg   LMP 06/10/2011   SpO2 95%   BMI 30.11 kg/m  Physical Exam Constitutional:      General: She is not in acute distress.    Appearance: She is ill-appearing. She is not toxic-appearing.     Comments: Obtunded, opens eyes to voice, does not speak,  Eyes:     Comments: Left pupil is 7 mm and sluggish.  Right pupil is 3 mm and reactive. Left periorbital ecchymosis  Cardiovascular:     Rate and Rhythm: Regular rhythm. Tachycardia present.     Heart sounds: No murmur heard.    Comments: Shallow respirations, equal breath sounds Pulmonary:     Effort: Respiratory distress present.     Breath sounds: Normal breath sounds. No wheezing.  Abdominal:     Tenderness: There is no abdominal tenderness. There is no guarding or rebound.  Musculoskeletal:        General: No swelling or tenderness. Normal range of motion.  Skin:    General: Skin is warm.  Neurological:     Comments: Obtunded, opens eyes to voice, does not follow commands reliably.  Moves all extremities spontaneously but not to command.     ED Results / Procedures / Treatments   Labs (all labs ordered are listed, but only abnormal results are displayed) Labs Reviewed  COMPREHENSIVE METABOLIC PANEL - Abnormal; Notable for the following components:      Result Value   Glucose, Bld 119 (*)    Total Protein 6.3 (*)    Albumin 2.8 (*)    AST 53 (*)    All other components within normal limits  CBC WITH DIFFERENTIAL/PLATELET - Abnormal; Notable for the following components:   Hemoglobin 11.9 (*)    MCV 79.6 (*)    MCH 25.0 (*)    Platelets 128 (*)    nRBC 0.6 (*)    Abs Immature Granulocytes 0.49 (*)    All other components within normal limits  PROTIME-INR - Abnormal; Notable for the following components:   Prothrombin Time 17.8 (*)    INR 1.5 (*)    All other components within normal limits  URINALYSIS, ROUTINE W REFLEX MICROSCOPIC -  Abnormal; Notable for the following components:   APPearance CLOUDY (*)    All other components within normal limits  CBG MONITORING, ED - Abnormal; Notable for the following components:   Glucose-Capillary 120 (*)    All other components within normal limits  I-STAT CHEM 8, ED - Abnormal; Notable for the following components:   BUN 31 (*)  Glucose, Bld 116 (*)    All other components within normal limits  I-STAT ARTERIAL BLOOD GAS, ED - Abnormal; Notable for the following components:   pH, Arterial 7.495 (*)    pO2, Arterial 63 (*)    Potassium 3.0 (*)    HCT 32.0 (*)    Hemoglobin 10.9 (*)    All other components within normal limits  TROPONIN I (HIGH SENSITIVITY) - Abnormal; Notable for the following components:   Troponin I (High Sensitivity) 22 (*)    All other components within normal limits  CULTURE, BLOOD (ROUTINE X 2)  CULTURE, BLOOD (ROUTINE X 2)  LACTIC ACID, PLASMA  RAPID URINE DRUG SCREEN, HOSP PERFORMED  TSH  LACTIC ACID, PLASMA  ETHANOL  SALICYLATE LEVEL  HIV ANTIBODY (ROUTINE TESTING W REFLEX)  BLOOD GAS, ARTERIAL  I-STAT CHEM 8, ED  ABO/RH  TYPE AND SCREEN  TROPONIN I (HIGH SENSITIVITY)    EKG None  Radiology CT Head Wo Contrast  Addendum Date: 08/31/2022   ADDENDUM REPORT: 09/10/2022 12:24 ADDENDUM: Critical Value/emergent results were called by telephone at the time of interpretation on 08/24/2022 at 1217 hours to Dr. Ezequiel Essex , who verbally acknowledged these results. Electronically Signed   By: Genevie Ann M.D.   On: 09/07/2022 12:24   Result Date: 08/20/2022 CLINICAL DATA:  66 year old female with increasing lethargy, currently unresponsive and blown pupil. EXAM: CT HEAD WITHOUT CONTRAST TECHNIQUE: Contiguous axial images were obtained from the base of the skull through the vertex without intravenous contrast. RADIATION DOSE REDUCTION: This exam was performed according to the departmental dose-optimization program which includes automated  exposure control, adjustment of the mA and/or kV according to patient size and/or use of iterative reconstruction technique. COMPARISON:  Brain MRI 03/12/2011. FINDINGS: Brain: Large volume hypodense left side subdural hematoma, maxilla mall along the left anterior frontal convexity and up to 26 mm thickness there (series 5, image 20 and series 3, image 18. There is a left para falcine component also, with a small volume of layering hyperdense blood there. Intracranial mass effect with rightward midline shift up to 18 mm. Trapped right lateral ventricle with mild temporal horn enlargement and transependymal edema. Subfalcine and left uncal herniation (series 5, image 33). Effaced suprasellar and interpeduncular cisterns. No tonsillar herniation, cisterna magna is patent. No other cerebral edema. No cortically based acute infarct identified. No other intracranial hemorrhage. Vascular: No suspicious intracranial vascular hyperdensity. Skull: Previous left mastoidectomy and mastoid cranioplasty, new since 2012. There is a left occipital bone anchor in place on series 4, image 24. No acute osseous abnormality identified. Sinuses/Orbits: Previous mastoidectomy and mastoid cranioplasty. Partially opacified mastoid cavity with fluid. Maxillary sinus mucous retention cysts and moderate sphenoid sinus mucoperiosteal thickening. Similar mucoperiosteal thickening at the left frontoethmoidal recess. Other: No acute orbit or scalp soft tissue finding. Intubated on the scout view. IMPRESSION: 1. Large volume low-density Left Side Subdural Hematoma up to 26 mm thickness. 2. Severe intracranial mass effect with rightward midline shift up to 18 mm, subfalcine and left uncal herniation. Trapped right lateral ventricle with some transependymal edema. 3. No associated acute infarct identified. 4. Previous left mastoidectomy and mastoid cranioplasty, and nearby left occipital bone anchor in place. 5. Paranasal sinus inflammation.  Electronically Signed: By: Genevie Ann M.D. On: 08/26/2022 12:12   CT Cervical Spine Wo Contrast  Result Date: 09/12/2022 CLINICAL DATA:  66 year old female with increasing lethargy, currently unresponsive and blown pupil. Large left side subdural hematoma. EXAM: CT CERVICAL SPINE WITHOUT CONTRAST TECHNIQUE:  Multidetector CT imaging of the cervical spine was performed without intravenous contrast. Multiplanar CT image reconstructions were also generated. RADIATION DOSE REDUCTION: This exam was performed according to the departmental dose-optimization program which includes automated exposure control, adjustment of the mA and/or kV according to patient size and/or use of iterative reconstruction technique. COMPARISON:  CT head and face today. FINDINGS: Alignment: Straightening of cervical lordosis. Cervicothoracic junction alignment is within normal limits. Bilateral posterior element alignment is within normal limits. Skull base and vertebrae: Partially visible left mastoidectomy in cranioplasty, detailed separately. Elsewhere the visible skull base appears intact. C1 and C2 appear intact and aligned. No acute osseous abnormality identified. Soft tissues and spinal canal: No prevertebral fluid or swelling. No visible canal hematoma. Intubated, and the ET tube courses appropriately into the subglottic trachea. Fluid in the pharynx. Disc levels: Lower cervical bulky disc and endplate degeneration E5-I7 and C6-C7. Associated mild cervical spinal stenosis. Upper chest: Mild dependent atelectasis in the lung apices. Endotracheal tube within the subglottic trachea. Grossly intact visible upper thoracic levels. IMPRESSION: 1. No acute traumatic injury identified in the cervical spine. Lower cervical spine disc and endplate degeneration with suspected mild cervical spinal stenosis. 2. Intubated, and ET tube courses appropriately into the trachea. Electronically Signed   By: Genevie Ann M.D.   On: 08/19/2022 12:24   CT  Maxillofacial Wo Contrast  Result Date: 09/01/2022 CLINICAL DATA:  66 year old female with increasing lethargy, currently unresponsive and blown pupil. Large left side subdural hematoma. EXAM: CT MAXILLOFACIAL WITHOUT CONTRAST TECHNIQUE: Multidetector CT imaging of the maxillofacial structures was performed. Multiplanar CT image reconstructions were also generated. RADIATION DOSE REDUCTION: This exam was performed according to the departmental dose-optimization program which includes automated exposure control, adjustment of the mA and/or kV according to patient size and/or use of iterative reconstruction technique. COMPARISON:  CT head and cervical spine today reported separately. FINDINGS: Osseous: Bilateral TMJ degeneration. Mandible intact and located. Maxilla, zygoma, pterygoid, and nasal bones intact. Central skull base intact, with previous left mastoidectomy and cranioplasty there, further described on head CT today. Orbits: Intact orbital walls. Orbits soft tissues appears symmetric and negative. Sinuses: Previous left mastoidectomy with partially opacified surgical cavity with fluid and bubbly opacity. Left tympanic portion of the cavity remains relatively well aerated. Right tympanic cavity and mastoids are clear. Moderate left frontoethmoidal and bilateral sphenoid mucoperiosteal thickening. Bilateral maxillary sinus retention cysts. Soft tissues: Intubated. Endotracheal tube courses appropriately toward the airway at the larynx. Fluid in the pharynx. Otherwise negative visible noncontrast deep soft tissue spaces of the face. Limited intracranial: Intracranial mass effect from left side extra-axial collection is reported separately today. IMPRESSION: 1. No acute facial fracture identified. 2. Intubated with satisfactory visible endotracheal tube. 3. Chronic appearing paranasal sinus disease. Previous left mastoidectomy and cranioplasty. Electronically Signed   By: Genevie Ann M.D.   On: 08/16/2022 12:17     Procedures Procedure Name: Intubation Date/Time: 08/17/2022 11:49 AM  Performed by: Ezequiel Essex, MDPre-anesthesia Checklist: Patient identified, Patient being monitored, Emergency Drugs available, Timeout performed and Suction available Oxygen Delivery Method: Non-rebreather mask Preoxygenation: Pre-oxygenation with 100% oxygen Induction Type: Rapid sequence Ventilation: Mask ventilation without difficulty Laryngoscope Size: Glidescope and 3 Grade View: Grade I Tube size: 7.5 mm Number of attempts: 1 Airway Equipment and Method: Video-laryngoscopy, Stylet and Patient positioned with wedge pillow Placement Confirmation: ETT inserted through vocal cords under direct vision, CO2 detector and Breath sounds checked- equal and bilateral Secured at: 24 cm Tube secured with: ETT holder Dental  Injury: Teeth and Oropharynx as per pre-operative assessment  Difficulty Due To: Difficult Airway- due to limited oral opening Future Recommendations: Recommend- induction with short-acting agent, and alternative techniques readily available    .Critical Care  Performed by: Ezequiel Essex, MD Authorized by: Ezequiel Essex, MD   Critical care provider statement:    Critical care time (minutes):  60   Critical care time was exclusive of:  Separately billable procedures and treating other patients   Critical care was necessary to treat or prevent imminent or life-threatening deterioration of the following conditions:  CNS failure or compromise and respiratory failure   Critical care was time spent personally by me on the following activities:  Development of treatment plan with patient or surrogate, discussions with consultants, evaluation of patient's response to treatment, examination of patient, ordering and review of laboratory studies, ordering and review of radiographic studies, ordering and performing treatments and interventions, pulse oximetry, re-evaluation of patient's condition,  review of old charts, obtaining history from patient or surrogate, ventilator management and vascular access procedures   I assumed direction of critical care for this patient from another provider in my specialty: no     Care discussed with: admitting provider       Medications Ordered in ED Medications  lactated ringers bolus 1,000 mL (has no administration in time range)  naloxone [facility]) injection (1 mg Intravenous Given 08/28/2022 1132)  rocuronium (ZEMURON) injection (90 mg Intravenous Given 09/13/2022 1135)  etomidate (AMIDATE) injection (20 mg Intravenous Given 09/08/2022 1135)  propofol (DIPRIVAN) 500 MG/50ML infusion (10.376 mcg/kg/min  77.1 kg Intravenous New Bag/Given 08/25/2022 1141)    ED Course/ Medical Decision Making/ A&P                           Medical Decision Making Amount and/or Complexity of Data Reviewed Labs: ordered. Decision-making details documented in ED Course. Radiology: ordered and independent interpretation performed. Decision-making details documented in ED Course. ECG/medicine tests: ordered and independent interpretation performed. Decision-making details documented in ED Course.  Risk Prescription drug management. Decision regarding hospitalization.  Altered mental status, obtunded and minimally responsive.  Shallow respirations on arrival.  Opens eyes to voice but does not follow commands or speak.  Decision made to intubate for airway protection.  Did not respond to Narcan.  Does have unequal pupils and ecchymosis to left eyebrow which is concerning for possible fall with possible intracranial hemorrhage.  Patient intubated as above.  Taken emergently to CT scan.  Code stroke not activated as last seen normal was 3 days ago.  CT concerning for large subdural hematoma that is low-density.  There is midline shift as well as uncal herniation.  Discussed with Dr. Nevada Crane of radiology, results reviewed and interpreted by me.  Patient started on hypertonic  saline, mannitol, labetalol and Cleviprex for blood pressure control.  Blood pressure goal 161 systolic or less.  Discussed with Dr. Saintclair Halsted and neurosurgery team.  Discussed with Dr. Carlis Abbott and critical care team.  Patient to be taken emergently to the OR.  She is not on any anticoagulation or any antiplatelet medication.  Husband at bedside confirms. Husband understands this is a life-threatening hemorrhage that needs emergent evacuation in the operating room.        Final Clinical Impression(s) / ED Diagnoses Final diagnoses:  Subdural hematoma ([place])  Acute respiratory failure with hypoxia ([place])    Rx / DC Orders ED Discharge Orders     None  Ezequiel Essex, MD 09/01/2022 [phone]

## 2022-08-30 NOTE — Transfer of Care (Signed)
Immediate Anesthesia Transfer of Care Note  Patient: Kristin Delgado  Procedure(s) Performed: Trudee Kuster HOLE CRANIOTOMY FOR SUBDURAL HEMATOMA (Left: Head)  Patient Location: ICU  Anesthesia Type:General  Level of Consciousness: Patient remains intubated per anesthesia plan  Airway & Oxygen Therapy: Patient remains intubated per anesthesia plan and Patient placed on Ventilator (see vital sign flow sheet for setting)  Post-op Assessment: Report given to RN and Post -op Vital signs reviewed and stable  Post vital signs: Reviewed and stable  Last Vitals:  Vitals Value Taken Time  BP 106/73 08/28/2022 1528  Temp    Pulse 79 09/14/2022 1539  Resp 24 08/18/2022 1539  SpO2 89 % 08/16/2022 1539  Vitals shown include unvalidated device data.  Last Pain:  Vitals:   09/01/2022 1129  PainSc: 0-No pain         Complications: No notable events documented.

## 2022-08-30 NOTE — Sedation Documentation (Signed)
Preoxygenating currently prior to intubation

## 2022-08-30 NOTE — ED Triage Notes (Addendum)
Pt BIBA from home. Pt is only responsive to pain . Pt has been declining x 2-3 days, more lethargic. No known falls.   Will follow some commands on ED arrival.

## 2022-08-30 NOTE — ED Notes (Addendum)
Pt noted to move hands and coughing against ventilator. Assisted Radiology to sit pt up for chest xray. Pt husband at bedside. Or called to transport pt to surgery. OR team to arrive for transport.

## 2022-08-31 ENCOUNTER — Inpatient Hospital Stay (HOSPITAL_COMMUNITY): Payer: Medicare Other

## 2022-08-31 ENCOUNTER — Encounter (HOSPITAL_COMMUNITY): Payer: Self-pay | Admitting: Neurosurgery

## 2022-08-31 DIAGNOSIS — J9601 Acute respiratory failure with hypoxia: Secondary | ICD-10-CM

## 2022-08-31 DIAGNOSIS — R1310 Dysphagia, unspecified: Secondary | ICD-10-CM | POA: Diagnosis not present

## 2022-08-31 DIAGNOSIS — M7989 Other specified soft tissue disorders: Secondary | ICD-10-CM | POA: Diagnosis not present

## 2022-08-31 DIAGNOSIS — E876 Hypokalemia: Secondary | ICD-10-CM

## 2022-08-31 DIAGNOSIS — S065XAA Traumatic subdural hemorrhage with loss of consciousness status unknown, initial encounter: Secondary | ICD-10-CM | POA: Diagnosis not present

## 2022-08-31 LAB — CBC
HCT: 31.8 % — ABNORMAL LOW (ref 36.0–46.0)
Hemoglobin: 9.6 g/dL — ABNORMAL LOW (ref 12.0–15.0)
MCH: 24.8 pg — ABNORMAL LOW (ref 26.0–34.0)
MCHC: 30.2 g/dL (ref 30.0–36.0)
MCV: 82.2 fL (ref 80.0–100.0)
Platelets: 98 10*3/uL — ABNORMAL LOW (ref 150–400)
RBC: 3.87 MIL/uL (ref 3.87–5.11)
RDW: 14.2 % (ref 11.5–15.5)
WBC: 10.5 10*3/uL (ref 4.0–10.5)
nRBC: 0.3 % — ABNORMAL HIGH (ref 0.0–0.2)

## 2022-08-31 LAB — BASIC METABOLIC PANEL
Anion gap: 11 (ref 5–15)
BUN: 22 mg/dL (ref 8–23)
CO2: 26 mmol/L (ref 22–32)
Calcium: 8.8 mg/dL — ABNORMAL LOW (ref 8.9–10.3)
Chloride: 109 mmol/L (ref 98–111)
Creatinine, Ser: 0.68 mg/dL (ref 0.44–1.00)
GFR, Estimated: >60 mL/min (ref >60)
Glucose, Bld: 130 mg/dL — ABNORMAL HIGH (ref 70–99)
Potassium: 3.4 mmol/L — ABNORMAL LOW (ref 3.5–5.1)
Sodium: 146 mmol/L — ABNORMAL HIGH (ref 135–145)

## 2022-08-31 LAB — TRIGLYCERIDES: Triglycerides: 397 mg/dL — ABNORMAL HIGH (ref <150)

## 2022-08-31 MED ORDER — ATORVASTATIN CALCIUM 10 MG PO TABS
20.0000 mg | ORAL_TABLET | Freq: Every day | ORAL | Status: DC
  Administered 2022-09-01 – 2022-09-05 (×4): 20 mg via ORAL
  Filled 2022-08-31 (×5): qty 2

## 2022-08-31 MED ORDER — POLYETHYLENE GLYCOL 3350 17 G PO PACK
17.0000 g | PACK | Freq: Every day | ORAL | Status: DC
  Administered 2022-09-01 – 2022-09-02 (×2): 17 g via ORAL
  Filled 2022-08-31 (×3): qty 1

## 2022-08-31 MED ORDER — PANTOPRAZOLE SODIUM 40 MG PO TBEC
40.0000 mg | DELAYED_RELEASE_TABLET | Freq: Every day | ORAL | Status: DC
  Administered 2022-09-02: 40 mg via ORAL
  Filled 2022-08-31: qty 1

## 2022-08-31 MED ORDER — POTASSIUM CHLORIDE 20 MEQ PO PACK: 40.0000 meq | PACK | Freq: Once | ORAL | Status: DC

## 2022-08-31 MED ORDER — CEFDINIR 300 MG PO CAPS
300.0000 mg | ORAL_CAPSULE | Freq: Two times a day (BID) | ORAL | Status: AC
  Administered 2022-08-31 – 2022-09-02 (×5): 300 mg via ORAL
  Filled 2022-08-31 (×9): qty 1

## 2022-08-31 MED ORDER — PHENYLEPHRINE HCL-NACL 20-0.9 MG/250ML-% IV SOLN
INTRAVENOUS | Status: AC
  Filled 2022-08-31: qty 500

## 2022-08-31 MED ORDER — DOCUSATE SODIUM 100 MG PO CAPS
100.0000 mg | ORAL_CAPSULE | Freq: Two times a day (BID) | ORAL | Status: DC
  Administered 2022-09-01 – 2022-09-02 (×3): 100 mg via ORAL
  Filled 2022-08-31 (×4): qty 1

## 2022-08-31 MED ORDER — LISINOPRIL 10 MG PO TABS
10.0000 mg | ORAL_TABLET | Freq: Every day | ORAL | Status: DC
  Administered 2022-08-31 – 2022-09-02 (×3): 10 mg via ORAL
  Filled 2022-08-31 (×3): qty 1

## 2022-08-31 MED ORDER — CHLORTHALIDONE 25 MG PO TABS
25.0000 mg | ORAL_TABLET | Freq: Every day | ORAL | Status: DC
  Administered 2022-09-01 – 2022-09-02 (×2): 25 mg via ORAL
  Filled 2022-08-31 (×3): qty 1

## 2022-08-31 MED ORDER — POTASSIUM CHLORIDE CRYS ER 20 MEQ PO TBCR
40.0000 meq | EXTENDED_RELEASE_TABLET | Freq: Once | ORAL | Status: AC
  Administered 2022-08-31: 40 meq via ORAL
  Filled 2022-08-31: qty 2

## 2022-08-31 MED ORDER — ACETAMINOPHEN 325 MG PO TABS
650.0000 mg | ORAL_TABLET | ORAL | Status: DC | PRN
  Administered 2022-09-01 – 2022-09-02 (×4): 650 mg via ORAL
  Filled 2022-08-31 (×5): qty 2

## 2022-08-31 MED ORDER — CEFDINIR 250 MG/5ML PO SUSR
300.0000 mg | Freq: Two times a day (BID) | ORAL | Status: DC
  Filled 2022-08-31: qty 6

## 2022-08-31 MED ORDER — LABETALOL HCL 5 MG/ML IV SOLN
5.0000 mg | INTRAVENOUS | Status: DC | PRN
  Administered 2022-09-01: 5 mg via INTRAVENOUS
  Filled 2022-08-31: qty 4
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
## 2022-08-31 NOTE — Progress Notes (Signed)
Subjective: Patient reports doing well, some mild headaches  Objective: Vital signs in last 24 hours: Temp:  [97.5 F (36.4 C)-99.5 F (37.5 C)] 98.4 F (36.9 C) (10/16 1152) Pulse Rate:  [80-98] 87 (10/16 1200) Resp:  [0-26] 22 (10/16 1200) BP: (99-126)/(49-84) 124/70 (10/16 1200) SpO2:  [89 %-100 %] 99 % (10/16 1200) Arterial Line BP: (104-183)/(60-98) 104/69 (10/16 1200) FiO2 (%):  [40 %-60 %] 40 % (10/16 0750) Weight:  [76.3 kg] 76.3 kg (10/16 0417)  Intake/Output from previous day: 10/15 0701 - 10/16 0700 In: 1886.5 [I.V.:1386.5; IV Piggyback:500] Out: 1515 [HYIFO:2774; Drains:115; Blood:25] Intake/Output this shift: Total I/O In: 20.5 [I.V.:20.5] Out: 185 [Urine:175; Drains:10]  Neurologic: Grossly normal  Lab Results: Lab Results  Component Value Date   WBC 10.5 08/31/2022   HGB 9.6 (L) 08/31/2022   HCT 31.8 (L) 08/31/2022   MCV 82.2 08/31/2022   PLT 98 (L) 08/31/2022   Lab Results  Component Value Date   INR 1.5 (H) 08/26/2022   BMET Lab Results  Component Value Date   NA 146 (H) 08/31/2022   K 3.4 (L) 08/31/2022   CL 109 08/31/2022   CO2 26 08/31/2022   GLUCOSE 130 (H) 08/31/2022   BUN 22 08/31/2022   CREATININE 0.68 08/31/2022   CALCIUM 8.8 (L) 08/31/2022    Studies/Results: DG Chest Port 1 View  Result Date: 08/31/2022 CLINICAL DATA:  Endotracheal tube in place. EXAM: PORTABLE CHEST 1 VIEW COMPARISON:  One-view chest x-ray 09/07/2022 FINDINGS: Endotracheal tube terminates 3.7 cm above the carina, in stable position. Gastric tube terminates within the stomach. The heart is enlarged. Mild pulmonary vascular congestion is increased slightly. Left greater than right basilar airspace opacities are present. A small left pleural effusion is suspected. IMPRESSION: 1. Stable support apparatus. 2. Cardiomegaly with slight increase in pulmonary vascular congestion. 3. Left greater than right basilar airspace disease likely reflects atelectasis. Infection is  not excluded. Electronically Signed   By: San Morelle M.D.   On: 08/31/2022 07:18   CT HEAD WO CONTRAST (5MM)  Result Date: 08/31/2022 CLINICAL DATA:  66 year old female postoperative day 1 status post left side craniotomy and evacuation of large chronic subdural hematoma. EXAM: CT HEAD WITHOUT CONTRAST TECHNIQUE: Contiguous axial images were obtained from the base of the skull through the vertex without intravenous contrast. RADIATION DOSE REDUCTION: This exam was performed according to the departmental dose-optimization program which includes automated exposure control, adjustment of the mA and/or kV according to patient size and/or use of iterative reconstruction technique. COMPARISON:  Head CT yesterday 08/21/2022. Prior brain MRI 03/12/2011. FINDINGS: Brain: Left subdural drain in place from the more posterior burr hole. Substantially smaller and now mixed density left side subdural collection is 8-9 mm in thickness (coronal image 32). Small volume pneumocephalus. A much smaller component of hypodense subdural fluid along the left falx is not significantly changed at 6 mm. Rightward midline shift has decreased from 18 mm yesterday to 8 mm now. Left-side uncal herniation appears largely resolved. Improved appearance of the left lateral ventricle, and the right temporal and occipital horns appears slightly less dilated. However, there is a new small volume of intraventricular hemorrhage layering in both occipital horns on series 3, image 17. And a small new volume of hyperdense hemorrhage in the posterior fossa including the ventral basilar cisterns (series 3, image 13). This more resembles subarachnoid blood than posterior fossa subdural blood. And there is also subarachnoid hemorrhage in the right central or postcentral sulcus series 3, image 28. Other  trace bilateral SAH is visible. Despite the new subarachnoid hemorrhage, despite the subarachnoid hemorrhage basilar cistern patency has improved.  No ventriculomegaly. Vascular: Faint Calcified atherosclerosis at the skull base. Skull: 2 new left superior convexity skull burr holes. Chronic left mastoidectomy and mastoid cranioplasty. Sinuses/Orbits: Scattered paranasal sinus mucoperiosteal thickening is stable along with maxillary retention cysts. Chronic left mastoidectomy and cranioplasty with stable fluid and opacification of that surgical cavity. Other: Postoperative changes to the left superior scalp convexity. Scalp hematoma and skin staples. Orbits soft tissues appear stable and negative. Oral enteric tube and endotracheal tube partially visible. IMPRESSION: 1. Postoperative regression of the large left subdural collection, now mixed density and 8-9 mm in thickness. Left subdural drain in place. 2. But new small volume of Subarachnoid hemorrhage, most concentrated in the ventral basilar cisterns, but scattered bilaterally. Associated new small volume intraventricular blood. 3. Decreased intracranial mass effect, with rightward midline shift decreased to 8 mm now (previously 18 mm). Improved ventricle configuration. And basilar cistern patency appears improved despite the new SAH. Electronically Signed   By: Genevie Ann M.D.   On: 08/31/2022 04:59   DG Abd 1 View  Result Date: 08/16/2022 CLINICAL DATA:  OG tube placement EXAM: ABDOMEN - 1 VIEW COMPARISON:  None Available. FINDINGS: Esophagogastric tube is positioned with tip and side port below the diaphragm. Nonobstructive pattern of included bowel gas. IMPRESSION: Esophagogastric tube is positioned with tip and side port below the diaphragm. Electronically Signed   By: Delanna Ahmadi M.D.   On: 09/11/2022 16:16   DG Chest Portable 1 View  Result Date: 08/19/2022 CLINICAL DATA:  Endotracheal tube. EXAM: PORTABLE CHEST 1 VIEW COMPARISON:  08/23/2022. FINDINGS: Endotracheal tube tip projects 2.3 cm above the carina. Left greater than right lung base opacities are noted consistent with atelectasis  and/or pneumonia. Remainder of the lungs is clear. No pneumothorax. Cardiac silhouette is normal in size. IMPRESSION: 1. Well-positioned endotracheal tube. 2. Increased lung base opacities, left greater than right, when compared to the most recent prior study, consistent with atelectasis/pneumonia. Electronically Signed   By: Lajean Manes M.D.   On: 09/02/2022 13:51   CT Head Wo Contrast  Addendum Date: 09/15/2022   ADDENDUM REPORT: 08/27/2022 12:24 ADDENDUM: Critical Value/emergent results were called by telephone at the time of interpretation on 09/13/2022 at 1217 hours to Dr. Ezequiel Essex , who verbally acknowledged these results. Electronically Signed   By: Genevie Ann M.D.   On: 08/21/2022 12:24   Result Date: 09/12/2022 CLINICAL DATA:  66 year old female with increasing lethargy, currently unresponsive and blown pupil. EXAM: CT HEAD WITHOUT CONTRAST TECHNIQUE: Contiguous axial images were obtained from the base of the skull through the vertex without intravenous contrast. RADIATION DOSE REDUCTION: This exam was performed according to the departmental dose-optimization program which includes automated exposure control, adjustment of the mA and/or kV according to patient size and/or use of iterative reconstruction technique. COMPARISON:  Brain MRI 03/12/2011. FINDINGS: Brain: Large volume hypodense left side subdural hematoma, maxilla mall along the left anterior frontal convexity and up to 26 mm thickness there (series 5, image 20 and series 3, image 18. There is a left para falcine component also, with a small volume of layering hyperdense blood there. Intracranial mass effect with rightward midline shift up to 18 mm. Trapped right lateral ventricle with mild temporal horn enlargement and transependymal edema. Subfalcine and left uncal herniation (series 5, image 33). Effaced suprasellar and interpeduncular cisterns. No tonsillar herniation, cisterna magna is patent. No other cerebral  edema. No  cortically based acute infarct identified. No other intracranial hemorrhage. Vascular: No suspicious intracranial vascular hyperdensity. Skull: Previous left mastoidectomy and mastoid cranioplasty, new since 2012. There is a left occipital bone anchor in place on series 4, image 24. No acute osseous abnormality identified. Sinuses/Orbits: Previous mastoidectomy and mastoid cranioplasty. Partially opacified mastoid cavity with fluid. Maxillary sinus mucous retention cysts and moderate sphenoid sinus mucoperiosteal thickening. Similar mucoperiosteal thickening at the left frontoethmoidal recess. Other: No acute orbit or scalp soft tissue finding. Intubated on the scout view. IMPRESSION: 1. Large volume low-density Left Side Subdural Hematoma up to 26 mm thickness. 2. Severe intracranial mass effect with rightward midline shift up to 18 mm, subfalcine and left uncal herniation. Trapped right lateral ventricle with some transependymal edema. 3. No associated acute infarct identified. 4. Previous left mastoidectomy and mastoid cranioplasty, and nearby left occipital bone anchor in place. 5. Paranasal sinus inflammation. Electronically Signed: By: Genevie Ann M.D. On: 09/09/2022 12:12   CT Cervical Spine Wo Contrast  Result Date: 09/07/2022 CLINICAL DATA:  66 year old female with increasing lethargy, currently unresponsive and blown pupil. Large left side subdural hematoma. EXAM: CT CERVICAL SPINE WITHOUT CONTRAST TECHNIQUE: Multidetector CT imaging of the cervical spine was performed without intravenous contrast. Multiplanar CT image reconstructions were also generated. RADIATION DOSE REDUCTION: This exam was performed according to the departmental dose-optimization program which includes automated exposure control, adjustment of the mA and/or kV according to patient size and/or use of iterative reconstruction technique. COMPARISON:  CT head and face today. FINDINGS: Alignment: Straightening of cervical lordosis.  Cervicothoracic junction alignment is within normal limits. Bilateral posterior element alignment is within normal limits. Skull base and vertebrae: Partially visible left mastoidectomy in cranioplasty, detailed separately. Elsewhere the visible skull base appears intact. C1 and C2 appear intact and aligned. No acute osseous abnormality identified. Soft tissues and spinal canal: No prevertebral fluid or swelling. No visible canal hematoma. Intubated, and the ET tube courses appropriately into the subglottic trachea. Fluid in the pharynx. Disc levels: Lower cervical bulky disc and endplate degeneration I3-J8 and C6-C7. Associated mild cervical spinal stenosis. Upper chest: Mild dependent atelectasis in the lung apices. Endotracheal tube within the subglottic trachea. Grossly intact visible upper thoracic levels. IMPRESSION: 1. No acute traumatic injury identified in the cervical spine. Lower cervical spine disc and endplate degeneration with suspected mild cervical spinal stenosis. 2. Intubated, and ET tube courses appropriately into the trachea. Electronically Signed   By: Genevie Ann M.D.   On: 08/31/2022 12:24   CT Maxillofacial Wo Contrast  Result Date: 09/11/2022 CLINICAL DATA:  66 year old female with increasing lethargy, currently unresponsive and blown pupil. Large left side subdural hematoma. EXAM: CT MAXILLOFACIAL WITHOUT CONTRAST TECHNIQUE: Multidetector CT imaging of the maxillofacial structures was performed. Multiplanar CT image reconstructions were also generated. RADIATION DOSE REDUCTION: This exam was performed according to the departmental dose-optimization program which includes automated exposure control, adjustment of the mA and/or kV according to patient size and/or use of iterative reconstruction technique. COMPARISON:  CT head and cervical spine today reported separately. FINDINGS: Osseous: Bilateral TMJ degeneration. Mandible intact and located. Maxilla, zygoma, pterygoid, and nasal bones  intact. Central skull base intact, with previous left mastoidectomy and cranioplasty there, further described on head CT today. Orbits: Intact orbital walls. Orbits soft tissues appears symmetric and negative. Sinuses: Previous left mastoidectomy with partially opacified surgical cavity with fluid and bubbly opacity. Left tympanic portion of the cavity remains relatively well aerated. Right tympanic cavity and mastoids are  clear. Moderate left frontoethmoidal and bilateral sphenoid mucoperiosteal thickening. Bilateral maxillary sinus retention cysts. Soft tissues: Intubated. Endotracheal tube courses appropriately toward the airway at the larynx. Fluid in the pharynx. Otherwise negative visible noncontrast deep soft tissue spaces of the face. Limited intracranial: Intracranial mass effect from left side extra-axial collection is reported separately today. IMPRESSION: 1. No acute facial fracture identified. 2. Intubated with satisfactory visible endotracheal tube. 3. Chronic appearing paranasal sinus disease. Previous left mastoidectomy and cranioplasty. Electronically Signed   By: Genevie Ann M.D.   On: 09/10/2022 12:17    Assessment/Plan: Postop day 1 crani for SDH evacuation. She self extubated this morning but is doing really well. Shes alert and oriented. Will d/c foley and aline. PT/OT ordered and oob today. Will d/c drain tomorrow.    LOS: 1 day    Kristin Delgado 08/31/2022, 1:39 PM

## 2022-08-31 NOTE — TOC CAGE-AID Note (Signed)
Transition of Care [facility]) - CAGE-AID Screening   Patient Details  Name: Kristin Delgado MRN: [id number] Date of Birth: 03/18/1956  [facility]) CM/SW Contact:    Benard Halsted, LCSW Phone Number: 08/31/2022, 9:59 AM   Clinical Narrative: Patient disoriented and unable to participate in screening at this time.    CAGE-AID Screening: Substance Abuse Screening unable to be completed due to: : Patient unable to participate

## 2022-08-31 NOTE — Progress Notes (Signed)
RRT called by pt's RN due to pt self extubating. On arrival, RN's had placed pt on . Pt in no apparent distress, able to speak her name. HR, SATs and RR WNL. No stridor heard. Clear/diminished bilateral breath sounds.

## 2022-08-31 NOTE — Progress Notes (Signed)
Lower extremity venous right study completed.   Please see CV Proc for preliminary results.   Luetta Piazza, RDMS, RVT  

## 2022-08-31 NOTE — Progress Notes (Signed)
Pt transported from 4N17 to CT01 and back by transport, RN and RT w/o complications.

## 2022-09-01 DIAGNOSIS — S065XAA Traumatic subdural hemorrhage with loss of consciousness status unknown, initial encounter: Secondary | ICD-10-CM | POA: Diagnosis not present

## 2022-09-01 DIAGNOSIS — J9601 Acute respiratory failure with hypoxia: Secondary | ICD-10-CM | POA: Diagnosis not present

## 2022-09-01 LAB — CBC
HCT: 30 % — ABNORMAL LOW (ref 36.0–46.0)
Hemoglobin: 9.1 g/dL — ABNORMAL LOW (ref 12.0–15.0)
MCH: 24.7 pg — ABNORMAL LOW (ref 26.0–34.0)
MCHC: 30.3 g/dL (ref 30.0–36.0)
MCV: 81.3 fL (ref 80.0–100.0)
Platelets: 88 10*3/uL — ABNORMAL LOW (ref 150–400)
RBC: 3.69 MIL/uL — ABNORMAL LOW (ref 3.87–5.11)
RDW: 14.1 % (ref 11.5–15.5)
WBC: 10.8 10*3/uL — ABNORMAL HIGH (ref 4.0–10.5)
nRBC: 0.2 % (ref 0.0–0.2)

## 2022-09-01 LAB — BASIC METABOLIC PANEL
Anion gap: 8 (ref 5–15)
BUN: 21 mg/dL (ref 8–23)
CO2: 27 mmol/L (ref 22–32)
Calcium: 8.9 mg/dL (ref 8.9–10.3)
Chloride: 110 mmol/L (ref 98–111)
Creatinine, Ser: 0.55 mg/dL (ref 0.44–1.00)
GFR, Estimated: >60 mL/min (ref >60)
Glucose, Bld: 120 mg/dL — ABNORMAL HIGH (ref 70–99)
Potassium: 3.6 mmol/L (ref 3.5–5.1)
Sodium: 145 mmol/L (ref 135–145)

## 2022-09-01 LAB — PHOSPHORUS: Phosphorus: 2.7 mg/dL (ref 2.5–4.6)

## 2022-09-01 LAB — MAGNESIUM: Magnesium: 2 mg/dL (ref 1.7–2.4)

## 2022-09-01 MED ORDER — POTASSIUM CHLORIDE CRYS ER 20 MEQ PO TBCR
40.0000 meq | EXTENDED_RELEASE_TABLET | Freq: Once | ORAL | Status: AC
  Administered 2022-09-01: 40 meq via ORAL
  Filled 2022-09-01: qty 2

## 2022-09-01 NOTE — Progress Notes (Signed)
Speech Language Pathology Treatment: Dysphagia  Patient Details Name: Kristin Delgado MRN: [id number] DOB: [birth year] Today's Date: 09/01/2022 Time: 1045-1100 SLP Time Calculation (min) (ACUTE ONLY): 15 min  Assessment / Plan / Recommendation Clinical Impression  Seen in conjunction with OT to maximize and self feeding for positioning for safety of po intake. Patient able to self feed thin liquids, pureed and mechanical soft solids, without overt s/s of aspiration. Oral phase prolonged but functional with soft solids. Patient continues to endorse intermittent globus, beginning prior to admission, however reports that she does consume soft meats, breads, and vegetables at home. Recommend advancing diet to dysphagia 3 with thin liquids. Patient largely independent with swallowing precautions and able to independently choose soft items from menu for easier consumption. In light of current neuro dx, suspect that esophageal workup would be more appropriate as OP as long as patient able to support herself nutritionally.  SLP will f/u briefly for diet tolerance and any further education.    HPI HPI: Kristin Delgado is a 66 y/o woman with a history of HTN, pituitary microadenoma who presented with confusion that has been progressively worsening for the past several days.Intubated in ED on 10/15.  Head CT demonstrated a 2cm SDH with L>R shift. Underwent 2 burr holes with irrigation of chronic SDH. Extubated 10/16. Pt with a history of esophageal dysphagia, likely an esophageal stricture.      SLP Plan  Continue with current plan of care      Recommendations for follow up therapy are one component of a multi-disciplinary discharge planning process, led by the attending physician.  Recommendations may be updated based on patient status, additional functional criteria and insurance authorization.    Recommendations  Diet recommendations: Dysphagia 3 (mechanical soft);Thin liquid Liquids provided via:  Cup;Straw Medication Administration: Crushed with puree Supervision: Patient able to self feed;Intermittent supervision to cue for compensatory strategies Compensations: Slow rate;Small sips/bites;Follow solids with liquid Postural Changes and/or Swallow Maneuvers: Seated upright 90 degrees;Upright 30-60 min after meal                Oral Care Recommendations: Oral care BID Follow Up Recommendations: No SLP follow up SLP Visit Diagnosis: Dysphagia, unspecified (R13.10) Plan: Continue with current plan of care        Kristin Milholland MA, [place]  09/01/2022, 11:04 AM

## 2022-09-01 NOTE — Evaluation (Addendum)
Occupational Therapy Evaluation Patient Details Name: Kristin Delgado MRN: [id number] DOB: 1955-12-19 Today's Date: 09/01/2022   History of Present Illness 66 y.o. F who presents 08/28/2022 with weakness, lethargy/obtundation/confusion; found to have 2 cm SDH with left to right shift ultimately requiring craniotomy and evacuation. Self extubated 10/16. Significant PMH: HTN, DM2.   Clinical Impression   PTA, pt was living with her husband and daughter and was independent. Pt currently requiring Min A for UB ADLs, Mod A for LB ADLs, and Min A for functional transfers with RW. Pt presenting with decreased cognition, balance, strength, and activity tolerance.  Despite pain and fatigue, pt very motivated to participate in therapy. Pt will require further acute OT to facilitate safe dc. Recommend dc to AIR for further OT to optimize safety, independence with ADLs, and return to PLOF.      Recommendations for follow up therapy are one component of a multi-disciplinary discharge planning process, led by the attending physician.  Recommendations may be updated based on patient status, additional functional criteria and insurance authorization.   Follow Up Recommendations  Acute inpatient rehab (3hours/day)    Assistance Recommended at Discharge Frequent or constant Supervision/Assistance  Patient can return home with the following A lot of help with walking and/or transfers;A lot of help with bathing/dressing/bathroom    Functional Status Assessment  Patient has had a recent decline in their functional status and demonstrates the ability to make significant improvements in function in a reasonable and predictable amount of time.  Equipment Recommendations  Other (comment) (Defer to next venue)    Recommendations for Other Services       Precautions / Restrictions Precautions Precautions: Fall Restrictions Weight Bearing Restrictions: No      Mobility Bed Mobility Overal bed mobility:  Needs Assistance Bed Mobility: Sit to Supine       Sit to supine: Min assist   General bed mobility comments: Min A for facilitating BLEs over EOB    Transfers Overall transfer level: Needs assistance Equipment used: Rolling walker (2 wheels), None Transfers: Sit to/from Stand, Bed to chair/wheelchair/BSC Sit to Stand: Min assist     Step pivot transfers: Min assist     General transfer comment: Min A for power up and then to manage RW while pivoting to EOB      Balance Overall balance assessment: Needs assistance Sitting-balance support: Feet supported Sitting balance-Leahy Scale: Fair     Standing balance support: Bilateral upper extremity supported Standing balance-Leahy Scale: Poor Standing balance comment: reliant on RW                           ADL either performed or assessed with clinical judgement   ADL Overall ADL's : Needs assistance/impaired Eating/Feeding: Set up;Sitting Eating/Feeding Details (indicate cue type and reason): Pt managing spoon and cup, drink with straw, and pening graham cracker packet Grooming: Set up;Sitting   Upper Body Bathing: Minimal assistance;Sitting Upper Body Bathing Details (indicate cue type and reason): washing her back Lower Body Bathing: Moderate assistance;Sit to/from stand   Upper Body Dressing : Minimal assistance;Sitting   Lower Body Dressing: Moderate assistance;Sit to/from stand   Toilet Transfer: Minimal assistance;Stand-pivot;Rolling walker (2 wheels) (simulated at [place]) Toilet Transfer Details (indicate cue type and reason): Min A for power up and then to manage RW during pivot to EOB. Toileting- Clothing Manipulation and Hygiene: Minimal assistance;Sit to/from stand Toileting - Clothing Manipulation Details (indicate cue type and reason): Pt performing peri care  while in standing with MIn A for balance     Functional mobility during ADLs: Minimal assistance;Rolling walker (2 wheels) (step  pivot) General ADL Comments: Pt presenting with decreased processing, strength, balance, and activitry tolerance. Noting SpO2 dropping in standing and pt fatigues quickly     Vision Baseline Vision/History: 1 Wears glasses       Perception     Praxis      Pertinent Vitals/Pain Pain Assessment Pain Assessment: No/denies pain Faces Pain Scale: Hurts a little bit Pain Location: head Pain Descriptors / Indicators: Guarding Pain Intervention(s): Monitored during session, Limited activity within patient's tolerance, Repositioned     Hand Dominance     Extremity/Trunk Assessment Upper Extremity Assessment Upper Extremity Assessment: RUE deficits/detail RUE Deficits / Details: Decreased grasp and gross strength compare to LUE. Able to perform tasks such as opening graham cracker wrapping.   Lower Extremity Assessment Lower Extremity Assessment: Defer to PT evaluation RLE Deficits / Details: Strength 5/5 LLE Deficits / Details: Strength 5/5 except hip flexion 4+/5   Cervical / Trunk Assessment Cervical / Trunk Assessment: Kyphotic   Communication Communication Communication: No difficulties   Cognition Arousal/Alertness: Awake/alert Behavior During Therapy: WFL for tasks assessed/performed, Flat affect Overall Cognitive Status: Impaired/Different from baseline Area of Impairment: Problem solving, Awareness                           Awareness: Emergent Problem Solving: Decreased initiation, Requires verbal cues, Slow processing General Comments: Oriented to place, time, and self. Requiring increased cues and time throughout. Following simple commands. Fatigues quickly     General Comments  SpO2 92-87% on 4L. HR 91. RR 23. BP 118/53    Exercises     Shoulder Instructions      Home Living Family/patient expects to be discharged to:: Private residence Living Arrangements: Spouse/significant other;Children (spouse, daughter) Available Help at Discharge:  Family;Available 24 hours/day Type of Home: House Home Access: Stairs to enter [employer] of Steps: 4 Entrance Stairs-Rails: Right;Left Home Layout: One level     Bathroom Shower/Tub: Occupational psychologist: Standard     Home Equipment: Shower seat          Prior Functioning/Environment Prior Level of Function : Independent/Modified Independent               ADLs Comments: ADLs, IADLs, and driving        OT Problem List: Decreased strength;Decreased range of motion;Decreased activity tolerance;Impaired balance (sitting and/or standing);Decreased knowledge of use of DME or AE;Decreased knowledge of precautions      OT Treatment/Interventions: Self-care/ADL training;Therapeutic exercise;Energy conservation;DME and/or AE instruction;Therapeutic activities;Patient/family education    OT Goals(Current goals can be found in the care plan section) Acute Rehab OT Goals Patient Stated Goal: Go home OT Goal Formulation: With patient Time For Goal Achievement: 09/15/22 Potential to Achieve Goals: Good  OT Frequency: Min 2X/week    Co-evaluation              AM-PAC OT "6 Clicks" Daily Activity     Outcome Measure Help from another person eating meals?: A Little Help from another person taking care of personal grooming?: A Little Help from another person toileting, which includes using toliet, bedpan, or urinal?: A Lot Help from another person bathing (including washing, rinsing, drying)?: A Lot Help from another person to put on and taking off regular upper body clothing?: A Little Help from another person to put on and  taking off regular lower body clothing?: A Lot 6 Click Score: 15   End of Session Equipment Utilized During Treatment: Gait belt;Rolling walker (2 wheels);Oxygen (4L) Nurse Communication: Mobility status  Activity Tolerance: Patient tolerated treatment well Patient left: in bed;with call bell/phone within reach;with bed alarm  set  OT Visit Diagnosis: Unsteadiness on feet (R26.81);Other abnormalities of gait and mobility (R26.89);Muscle weakness (generalized) (M62.81)                Time: 4715-9539 OT Time Calculation (min): 23 min Charges:  OT General Charges $OT Visit: 1 Visit OT Evaluation $OT Eval Moderate Complexity: 1 Mod  Stefen Juba MSOT, OTR/L Acute Rehab Office: [place] 09/01/2022, 2:51 PM

## 2022-09-01 NOTE — Progress Notes (Signed)
  Transition of Care [facility]) Screening Note   Patient Details  Name: Kristin Delgado Date of Birth: 05-Dec-1955   [facility] CM/SW Contact:    Ella Bodo, RN Phone Number: 09/01/2022, 12:07 PM    Transition of Care Department [facility]) has reviewed patient and no TOC needs have been identified at this time. We will continue to monitor patient advancement through interdisciplinary progression rounds. If new patient transition needs arise, please place a TOC consult.  Reinaldo Raddle, RN, BSN  Trauma/Neuro ICU Case Manager [phone]

## 2022-09-01 NOTE — Progress Notes (Signed)
? ?  Inpatient Rehab Admissions Coordinator : ? ?Per therapy recommendations, patient was screened for CIR candidacy by Kartik Fernando RN MSN.  At this time patient appears to be a potential candidate for CIR. I will place a rehab consult per protocol for full assessment. Please call me with any questions. ? ?Halie Gass RN MSN ?Admissions Coordinator ?[phone] ?  ?

## 2022-09-01 NOTE — Progress Notes (Signed)
   08/31/22 1400  General Information  HPI Ms. Daily is a 66 y/o woman with a history of HTN, pituitary microadenoma who presented with confusion that has been progressively worsening for the past several days.Intubated in ED on 10/15.  Head CT demonstrated a 2cm SDH with L>R shift. Underwent 2 burr holes with irrigation of chronic SDH. Extubated 10/16. Pt with a history of esophageal dysphagia, likely an esophageal stricture.  Type of Study Bedside Swallow Evaluation  Previous Swallow Assessment none  Diet Prior to this Study NPO  Temperature Spikes Noted No  History of Recent Intubation Yes  Length of Intubations (days) 2 days  Date extubated 08/31/22  Behavior/Cognition Alert;Cooperative  Oral Cavity Assessment WFL  Oral Care Completed by SLP No  Oral Cavity - Dentition Adequate natural dentition  Vision Functional for self-feeding  Self-Feeding Abilities Able to feed self  Patient Positioning Upright in bed  Baseline Vocal Quality Normal  Volitional Cough Strong  Volitional Swallow Able to elicit  Oral Assessment (Complete on admission/transfer/every shift)  Level of Consciousness Alert  Oral Motor/Sensory Function  Overall Oral Motor/Sensory Function WFL  Ice Chips  Ice chips WFL  Thin Liquid  Thin Liquid WFL  Nectar Thick Liquid  Nectar Thick Liquid NT  Honey Thick Liquid  Honey Thick Liquid NT  Puree  Puree WFL  Solid  Solid NT  SLP Assessment  Clinical Impression Statement (ACUTE ONLY) Pt demonstrates no signs of aspiration. However, pt reports prior to admission she was avoiding almost all solids; was eating soup and applesauce and ensure. Pt says she has never sought out any formal assessment or treatment other than diet modification. Given acute impairments related to SDH and bedbound status pt has increased risk of post prendial aspiration. Will initiate a full liquid diet for now with potential for upgrade as pt progresses. Ultimately she will need GI work up.  SLP  Visit Diagnosis Dysphagia, unspecified (R13.10)  Impact on safety and function Mild aspiration risk  Other Related Risk Factors Deconditioning  Swallow Evaluation Recommendations  SLP Diet Recommendations Thin liquid  Liquid Administration via Cup;Straw  Medication Administration Crushed with puree  Supervision Patient able to self feed  Compensations Slow rate;Small sips/bites  Postural Changes Seated upright at 90 degrees  Treatment Plan  Treatment Recommendations Therapy as outlined in treatment plan below  Functional Status Assessment Patient has had a recent decline in their functional status and demonstrates the ability to make significant improvements in function in a reasonable and predictable amount of time.  Speech Therapy Frequency (ACUTE ONLY) min 2x/week  Treatment Duration 2 weeks  Individuals Consulted  Consulted and Agree with Results and Recommendations Patient  Progression Toward Goals  Progression toward goals Progressing toward goals  SLP Time Calculation  SLP Start Time (ACUTE ONLY) 1400  SLP Stop Time (ACUTE ONLY) 1415  SLP Time Calculation (min) (ACUTE ONLY) 15 min  SLP Evaluations  $ SLP Speech Visit 1 Visit  SLP Evaluations  $BSS Swallow 1 Procedure
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
## 2022-09-01 NOTE — Progress Notes (Signed)
Subjective: Patient reports doing well, some mild headaches  Objective: Vital signs in last 24 hours: Temp:  [97.5 F (36.4 C)-98.4 F (36.9 C)] 98.2 F (36.8 C) (10/17 0400) Pulse Rate:  [77-98] 78 (10/17 0500) Resp:  [7-27] 27 (10/17 0500) BP: (119-153)/(54-82) 150/82 (10/17 0500) SpO2:  [94 %-100 %] 95 % (10/17 0500) Arterial Line BP: (101-144)/(69-73) 104/69 (10/16 1200) FiO2 (%):  [21 %] 21 % (10/16 0934)  Intake/Output from previous day: 10/16 0701 - 10/17 0700 In: 432 [P.O.:360; I.V.:72] Out: 933 [Urine:875; Drains:58] Intake/Output this shift: No intake/output data recorded.  Neurologic: Grossly normal  Lab Results: Lab Results  Component Value Date   WBC 10.8 (H) 09/01/2022   HGB 9.1 (L) 09/01/2022   HCT 30.0 (L) 09/01/2022   MCV 81.3 09/01/2022   PLT 88 (L) 09/01/2022   Lab Results  Component Value Date   INR 1.5 (H) 09/08/2022   BMET Lab Results  Component Value Date   NA 145 09/01/2022   K 3.6 09/01/2022   CL 110 09/01/2022   CO2 27 09/01/2022   GLUCOSE 120 (H) 09/01/2022   BUN 21 09/01/2022   CREATININE 0.55 09/01/2022   CALCIUM 8.9 09/01/2022    Studies/Results: VAS [name] LOWER EXTREMITY VENOUS (DVT)  Result Date: 08/31/2022  Lower Venous DVT Study Patient Name:  Kristin Delgado  Date of Exam:   08/31/2022 Medical Rec #: [id number]     Accession #:    [id number] Date of Birth: 1956-10-09    Patient Gender: F Patient Age:   55 years Exam Location:  [facility] Procedure:      VAS [name] LOWER EXTREMITY VENOUS (DVT) Referring Phys: Glenford Peers --------------------------------------------------------------------------------  Indications: Right leg swelling.  Comparison Study: 03-07-21 Bilateral lower extremity venous study was negative                   for DVT. Performing Technologist: Darlin Coco RDMS, RVT  Examination Guidelines: A complete evaluation includes B-mode imaging, spectral Doppler, color Doppler, and power Doppler as needed of all  accessible portions of each vessel. Bilateral testing is considered an integral part of a complete examination. Limited examinations for reoccurring indications may be performed as noted. The reflux portion of the exam is performed with the patient in reverse Trendelenburg.  +---------+---------------+---------+-----------+----------+--------------+ RIGHT    CompressibilityPhasicitySpontaneityPropertiesThrombus Aging +---------+---------------+---------+-----------+----------+--------------+ CFV      Full           Yes      Yes                                 +---------+---------------+---------+-----------+----------+--------------+ SFJ      Full                                                        +---------+---------------+---------+-----------+----------+--------------+ FV Prox  Full                                                        +---------+---------------+---------+-----------+----------+--------------+ FV Mid   Full                                                        +---------+---------------+---------+-----------+----------+--------------+  FV DistalFull                                                        +---------+---------------+---------+-----------+----------+--------------+ PFV      Full                                                        +---------+---------------+---------+-----------+----------+--------------+ POP      Full           Yes      Yes                                 +---------+---------------+---------+-----------+----------+--------------+ PTV      Full                                                        +---------+---------------+---------+-----------+----------+--------------+ PERO     Full                                                        +---------+---------------+---------+-----------+----------+--------------+ Gastroc  Full                                                         +---------+---------------+---------+-----------+----------+--------------+   +----+---------------+---------+-----------+----------+--------------+ LEFTCompressibilityPhasicitySpontaneityPropertiesThrombus Aging +----+---------------+---------+-----------+----------+--------------+ CFV Full           Yes      Yes                                 +----+---------------+---------+-----------+----------+--------------+     Summary: RIGHT: - There is no evidence of deep vein thrombosis in the lower extremity.  - No cystic structure found in the popliteal fossa.  LEFT: - No evidence of common femoral vein obstruction.  *See table(s) above for measurements and observations. Electronically signed by Orlie Pollen on 08/31/2022 at 5:26:03 PM.    Final    DG Chest Port 1 View  Result Date: 08/31/2022 CLINICAL DATA:  Endotracheal tube in place. EXAM: PORTABLE CHEST 1 VIEW COMPARISON:  One-view chest x-ray 08/18/2022 FINDINGS: Endotracheal tube terminates 3.7 cm above the carina, in stable position. Gastric tube terminates within the stomach. The heart is enlarged. Mild pulmonary vascular congestion is increased slightly. Left greater than right basilar airspace opacities are present. A small left pleural effusion is suspected. IMPRESSION: 1. Stable support apparatus. 2. Cardiomegaly with slight increase in pulmonary vascular congestion. 3. Left greater than right basilar airspace disease likely reflects atelectasis. Infection is not excluded. Electronically Signed   By: San Morelle M.D.   On: 08/31/2022 07:18  CT HEAD WO CONTRAST (5MM)  Result Date: 08/31/2022 CLINICAL DATA:  66 year old female postoperative day 1 status post left side craniotomy and evacuation of large chronic subdural hematoma. EXAM: CT HEAD WITHOUT CONTRAST TECHNIQUE: Contiguous axial images were obtained from the base of the skull through the vertex without intravenous contrast. RADIATION DOSE REDUCTION: This exam was  performed according to the departmental dose-optimization program which includes automated exposure control, adjustment of the mA and/or kV according to patient size and/or use of iterative reconstruction technique. COMPARISON:  Head CT yesterday 09/13/2022. Prior brain MRI 03/12/2011. FINDINGS: Brain: Left subdural drain in place from the more posterior burr hole. Substantially smaller and now mixed density left side subdural collection is 8-9 mm in thickness (coronal image 32). Small volume pneumocephalus. A much smaller component of hypodense subdural fluid along the left falx is not significantly changed at 6 mm. Rightward midline shift has decreased from 18 mm yesterday to 8 mm now. Left-side uncal herniation appears largely resolved. Improved appearance of the left lateral ventricle, and the right temporal and occipital horns appears slightly less dilated. However, there is a new small volume of intraventricular hemorrhage layering in both occipital horns on series 3, image 17. And a small new volume of hyperdense hemorrhage in the posterior fossa including the ventral basilar cisterns (series 3, image 13). This more resembles subarachnoid blood than posterior fossa subdural blood. And there is also subarachnoid hemorrhage in the right central or postcentral sulcus series 3, image 28. Other trace bilateral SAH is visible. Despite the new subarachnoid hemorrhage, despite the subarachnoid hemorrhage basilar cistern patency has improved. No ventriculomegaly. Vascular: Faint Calcified atherosclerosis at the skull base. Skull: 2 new left superior convexity skull burr holes. Chronic left mastoidectomy and mastoid cranioplasty. Sinuses/Orbits: Scattered paranasal sinus mucoperiosteal thickening is stable along with maxillary retention cysts. Chronic left mastoidectomy and cranioplasty with stable fluid and opacification of that surgical cavity. Other: Postoperative changes to the left superior scalp convexity. Scalp  hematoma and skin staples. Orbits soft tissues appear stable and negative. Oral enteric tube and endotracheal tube partially visible. IMPRESSION: 1. Postoperative regression of the large left subdural collection, now mixed density and 8-9 mm in thickness. Left subdural drain in place. 2. But new small volume of Subarachnoid hemorrhage, most concentrated in the ventral basilar cisterns, but scattered bilaterally. Associated new small volume intraventricular blood. 3. Decreased intracranial mass effect, with rightward midline shift decreased to 8 mm now (previously 18 mm). Improved ventricle configuration. And basilar cistern patency appears improved despite the new SAH. Electronically Signed   By: Genevie Ann M.D.   On: 08/31/2022 04:59   DG Abd 1 View  Result Date: 09/11/2022 CLINICAL DATA:  OG tube placement EXAM: ABDOMEN - 1 VIEW COMPARISON:  None Available. FINDINGS: Esophagogastric tube is positioned with tip and side port below the diaphragm. Nonobstructive pattern of included bowel gas. IMPRESSION: Esophagogastric tube is positioned with tip and side port below the diaphragm. Electronically Signed   By: Delanna Ahmadi M.D.   On: 09/13/2022 16:16   DG Chest Portable 1 View  Result Date: 08/18/2022 CLINICAL DATA:  Endotracheal tube. EXAM: PORTABLE CHEST 1 VIEW COMPARISON:  08/23/2022. FINDINGS: Endotracheal tube tip projects 2.3 cm above the carina. Left greater than right lung base opacities are noted consistent with atelectasis and/or pneumonia. Remainder of the lungs is clear. No pneumothorax. Cardiac silhouette is normal in size. IMPRESSION: 1. Well-positioned endotracheal tube. 2. Increased lung base opacities, left greater than right, when compared to the most  recent prior study, consistent with atelectasis/pneumonia. Electronically Signed   By: Lajean Manes M.D.   On: 08/22/2022 13:51   CT Head Wo Contrast  Addendum Date: 08/17/2022   ADDENDUM REPORT: 08/17/2022 12:24 ADDENDUM: Critical  Value/emergent results were called by telephone at the time of interpretation on 08/16/2022 at 1217 hours to Dr. Ezequiel Essex , who verbally acknowledged these results. Electronically Signed   By: Genevie Ann M.D.   On: 08/20/2022 12:24   Result Date: 08/19/2022 CLINICAL DATA:  66 year old female with increasing lethargy, currently unresponsive and blown pupil. EXAM: CT HEAD WITHOUT CONTRAST TECHNIQUE: Contiguous axial images were obtained from the base of the skull through the vertex without intravenous contrast. RADIATION DOSE REDUCTION: This exam was performed according to the departmental dose-optimization program which includes automated exposure control, adjustment of the mA and/or kV according to patient size and/or use of iterative reconstruction technique. COMPARISON:  Brain MRI 03/12/2011. FINDINGS: Brain: Large volume hypodense left side subdural hematoma, maxilla mall along the left anterior frontal convexity and up to 26 mm thickness there (series 5, image 20 and series 3, image 18. There is a left para falcine component also, with a small volume of layering hyperdense blood there. Intracranial mass effect with rightward midline shift up to 18 mm. Trapped right lateral ventricle with mild temporal horn enlargement and transependymal edema. Subfalcine and left uncal herniation (series 5, image 33). Effaced suprasellar and interpeduncular cisterns. No tonsillar herniation, cisterna magna is patent. No other cerebral edema. No cortically based acute infarct identified. No other intracranial hemorrhage. Vascular: No suspicious intracranial vascular hyperdensity. Skull: Previous left mastoidectomy and mastoid cranioplasty, new since 2012. There is a left occipital bone anchor in place on series 4, image 24. No acute osseous abnormality identified. Sinuses/Orbits: Previous mastoidectomy and mastoid cranioplasty. Partially opacified mastoid cavity with fluid. Maxillary sinus mucous retention cysts and  moderate sphenoid sinus mucoperiosteal thickening. Similar mucoperiosteal thickening at the left frontoethmoidal recess. Other: No acute orbit or scalp soft tissue finding. Intubated on the scout view. IMPRESSION: 1. Large volume low-density Left Side Subdural Hematoma up to 26 mm thickness. 2. Severe intracranial mass effect with rightward midline shift up to 18 mm, subfalcine and left uncal herniation. Trapped right lateral ventricle with some transependymal edema. 3. No associated acute infarct identified. 4. Previous left mastoidectomy and mastoid cranioplasty, and nearby left occipital bone anchor in place. 5. Paranasal sinus inflammation. Electronically Signed: By: Genevie Ann M.D. On: 09/07/2022 12:12   CT Cervical Spine Wo Contrast  Result Date: 09/13/2022 CLINICAL DATA:  66 year old female with increasing lethargy, currently unresponsive and blown pupil. Large left side subdural hematoma. EXAM: CT CERVICAL SPINE WITHOUT CONTRAST TECHNIQUE: Multidetector CT imaging of the cervical spine was performed without intravenous contrast. Multiplanar CT image reconstructions were also generated. RADIATION DOSE REDUCTION: This exam was performed according to the departmental dose-optimization program which includes automated exposure control, adjustment of the mA and/or kV according to patient size and/or use of iterative reconstruction technique. COMPARISON:  CT head and face today. FINDINGS: Alignment: Straightening of cervical lordosis. Cervicothoracic junction alignment is within normal limits. Bilateral posterior element alignment is within normal limits. Skull base and vertebrae: Partially visible left mastoidectomy in cranioplasty, detailed separately. Elsewhere the visible skull base appears intact. C1 and C2 appear intact and aligned. No acute osseous abnormality identified. Soft tissues and spinal canal: No prevertebral fluid or swelling. No visible canal hematoma. Intubated, and the ET tube courses  appropriately into the subglottic trachea. Fluid in the  pharynx. Disc levels: Lower cervical bulky disc and endplate degeneration B3-Z3 and C6-C7. Associated mild cervical spinal stenosis. Upper chest: Mild dependent atelectasis in the lung apices. Endotracheal tube within the subglottic trachea. Grossly intact visible upper thoracic levels. IMPRESSION: 1. No acute traumatic injury identified in the cervical spine. Lower cervical spine disc and endplate degeneration with suspected mild cervical spinal stenosis. 2. Intubated, and ET tube courses appropriately into the trachea. Electronically Signed   By: Genevie Ann M.D.   On: 08/20/2022 12:24   CT Maxillofacial Wo Contrast  Result Date: 08/18/2022 CLINICAL DATA:  66 year old female with increasing lethargy, currently unresponsive and blown pupil. Large left side subdural hematoma. EXAM: CT MAXILLOFACIAL WITHOUT CONTRAST TECHNIQUE: Multidetector CT imaging of the maxillofacial structures was performed. Multiplanar CT image reconstructions were also generated. RADIATION DOSE REDUCTION: This exam was performed according to the departmental dose-optimization program which includes automated exposure control, adjustment of the mA and/or kV according to patient size and/or use of iterative reconstruction technique. COMPARISON:  CT head and cervical spine today reported separately. FINDINGS: Osseous: Bilateral TMJ degeneration. Mandible intact and located. Maxilla, zygoma, pterygoid, and nasal bones intact. Central skull base intact, with previous left mastoidectomy and cranioplasty there, further described on head CT today. Orbits: Intact orbital walls. Orbits soft tissues appears symmetric and negative. Sinuses: Previous left mastoidectomy with partially opacified surgical cavity with fluid and bubbly opacity. Left tympanic portion of the cavity remains relatively well aerated. Right tympanic cavity and mastoids are clear. Moderate left frontoethmoidal and bilateral  sphenoid mucoperiosteal thickening. Bilateral maxillary sinus retention cysts. Soft tissues: Intubated. Endotracheal tube courses appropriately toward the airway at the larynx. Fluid in the pharynx. Otherwise negative visible noncontrast deep soft tissue spaces of the face. Limited intracranial: Intracranial mass effect from left side extra-axial collection is reported separately today. IMPRESSION: 1. No acute facial fracture identified. 2. Intubated with satisfactory visible endotracheal tube. 3. Chronic appearing paranasal sinus disease. Previous left mastoidectomy and cranioplasty. Electronically Signed   By: Genevie Ann M.D.   On: 08/21/2022 12:17    Assessment/Plan: Postop day 2 crani for SDH evacuation. JP drain d/c today. OOB today and if she does ok ambulating we will transfer her to progressive this afternoon.   LOS: 2 days    [name] Eivan Gallina 09/01/2022, 8:00 AM

## 2022-09-01 NOTE — Evaluation (Signed)
Physical Therapy Evaluation Patient Details Name: Kristin Delgado MRN: [id number] DOB: 1956/05/02 Today's Date: 09/01/2022  History of Present Illness  Pt is a 66 y.o. F who presents 08/18/2022 with weakness, lethargy/obtundation/confusion; found to have 2 cm SDH with left to right shift ultimately requiring craniotomy and evacuation. Self extubated 10/16. Significant PMH: HTN, DM2.  Clinical Impression  PTA, pt lives with her spouse and daughter and is independent. Pt presents with decreased functional mobility secondary to decreased cardiopulmonary endurance, impaired standing balance, deficits in problem solving/awareness, and functional weakness. Pt requiring min-mod assist and RW for limited ambulation from bed to chair. BP 148/74 sitting EOB, 130/63 sitting in recliner post mobility. Desat to 85% on RA at rest, and requires 2L O2 to maintain sats > 90%. Suspect pt will progress well given age and PLOF. Currently recommend AIR to address deficits and maximize functional independence. Will re-assess.      Recommendations for follow up therapy are one component of a multi-disciplinary discharge planning process, led by the attending physician.  Recommendations may be updated based on patient status, additional functional criteria and insurance authorization.  Follow Up Recommendations Acute inpatient rehab (3hours/day)      Assistance Recommended at Discharge Frequent or constant Supervision/Assistance  Patient can return home with the following  A little help with walking and/or transfers;A little help with bathing/dressing/bathroom;Assistance with cooking/housework;Direct supervision/assist for medications management;Direct supervision/assist for financial management;Assist for transportation;Help with stairs or ramp for entrance    Equipment Recommendations Rolling walker (2 wheels);BSC/3in1  Recommendations for Other Services  Rehab consult    Functional Status Assessment Patient has  had a recent decline in their functional status and demonstrates the ability to make significant improvements in function in a reasonable and predictable amount of time.     Precautions / Restrictions Precautions Precautions: Fall Restrictions Weight Bearing Restrictions: No      Mobility  Bed Mobility Overal bed mobility: Needs Assistance Bed Mobility: Supine to Sit     Supine to sit: Min assist     General bed mobility comments: Cues provided for initiation, requiring assist to progress trunk to upright position and use of bed pad to scoot left hip out to edge of bed    Transfers Overall transfer level: Needs assistance Equipment used: Rolling walker (2 wheels), None Transfers: Sit to/from Stand, Bed to chair/wheelchair/BSC Sit to Stand: Min assist, Mod assist   Step pivot transfers: Min assist       General transfer comment: Pt initially requiring modA to rise to partial stand from edge of bed, progressed to minA with use of RW, with cues provided for hand placement and transition. Taking pivotal steps from bed to chair with cues for sequencing/direction and walker use. Very slow    Ambulation/Gait                  Stairs            Wheelchair Mobility    Modified Rankin (Stroke Patients Only) Modified Rankin (Stroke Patients Only) Pre-Morbid Rankin Score: No symptoms Modified Rankin: Moderately severe disability     Balance Overall balance assessment: Needs assistance Sitting-balance support: Feet supported Sitting balance-Leahy Scale: Fair     Standing balance support: Bilateral upper extremity supported Standing balance-Leahy Scale: Poor Standing balance comment: reliant on RW                             Pertinent Vitals/Pain Pain Assessment Pain Assessment:  Faces Faces Pain Scale: Hurts a little bit Pain Location: head Pain Descriptors / Indicators: Guarding Pain Intervention(s): Monitored during session    Home  Living Family/patient expects to be discharged to:: Private residence Living Arrangements: Spouse/significant other;Children (spouse, daughter) Available Help at Discharge: Family;Available 24 hours/day Type of Home: House Home Access: Stairs to enter Entrance Stairs-Rails: Psychiatric nurse of Steps: 4   Home Layout: One level Home Equipment: Shower seat      Prior Function Prior Level of Function : Independent/Modified Independent                     Hand Dominance        Extremity/Trunk Assessment   Upper Extremity Assessment Upper Extremity Assessment: Defer to OT evaluation    Lower Extremity Assessment Lower Extremity Assessment: RLE deficits/detail;LLE deficits/detail RLE Deficits / Details: Strength 5/5 LLE Deficits / Details: Strength 5/5 except hip flexion 4+/5       Communication   Communication: No difficulties  Cognition Arousal/Alertness: Awake/alert Behavior During Therapy: Flat affect Overall Cognitive Status: Impaired/Different from baseline Area of Impairment: Problem solving, Awareness                           Awareness: Emergent Problem Solving: Decreased initiation, Requires verbal cues, Slow processing General Comments: Pt answering all questions appropriately, but has flat affect. Cues provided for initiation of tasks. Not oriented to day of week. Following all commands, but seems to have slower processing speed.        General Comments      Exercises     Assessment/Plan    PT Assessment Patient needs continued PT services  PT Problem List Decreased strength;Decreased activity tolerance;Decreased mobility;Decreased balance;Decreased cognition       PT Treatment Interventions Gait training;DME instruction;Stair training;Functional mobility training;Therapeutic exercise;Therapeutic activities;Balance training;Patient/family education    PT Goals (Current goals can be found in the Care Plan section)   Acute Rehab PT Goals Patient Stated Goal: did not state PT Goal Formulation: With patient Time For Goal Achievement: 09/15/22 Potential to Achieve Goals: Good    Frequency Min 4X/week     Co-evaluation               AM-PAC PT "6 Clicks" Mobility  Outcome Measure Help needed turning from your back to your side while in a flat bed without using bedrails?: A Little Help needed moving from lying on your back to sitting on the side of a flat bed without using bedrails?: A Little Help needed moving to and from a bed to a chair (including a wheelchair)?: A Little Help needed standing up from a chair using your arms (e.g., wheelchair or bedside chair)?: A Lot Help needed to walk in hospital room?: A Lot Help needed climbing 3-5 steps with a railing? : Total 6 Click Score: 14    End of Session Equipment Utilized During Treatment: Gait belt;Oxygen Activity Tolerance: Patient tolerated treatment well Patient left: in chair;with call bell/phone within reach Nurse Communication: Mobility status PT Visit Diagnosis: Unsteadiness on feet (R26.81);Difficulty in walking, not elsewhere classified (R26.2)    Time: 6962-9528 PT Time Calculation (min) (ACUTE ONLY): 33 min   Charges:   PT Evaluation $PT Eval Moderate Complexity: 1 Mod PT Treatments $Therapeutic Activity: 8-22 mins        Wyona Almas, PT, DPT Acute Rehabilitation Services Office [phone]   Deno Etienne 09/01/2022, 10:22 AM

## 2022-09-02 ENCOUNTER — Inpatient Hospital Stay (HOSPITAL_COMMUNITY): Payer: Medicare Other

## 2022-09-02 DIAGNOSIS — S065XAA Traumatic subdural hemorrhage with loss of consciousness status unknown, initial encounter: Secondary | ICD-10-CM | POA: Diagnosis not present

## 2022-09-02 LAB — CBC
HCT: 27.8 % — ABNORMAL LOW (ref 36.0–46.0)
Hemoglobin: 9 g/dL — ABNORMAL LOW (ref 12.0–15.0)
MCH: 25.1 pg — ABNORMAL LOW (ref 26.0–34.0)
MCHC: 32.4 g/dL (ref 30.0–36.0)
MCV: 77.4 fL — ABNORMAL LOW (ref 80.0–100.0)
Platelets: 77 10*3/uL — ABNORMAL LOW (ref 150–400)
RBC: 3.59 MIL/uL — ABNORMAL LOW (ref 3.87–5.11)
RDW: 13.7 % (ref 11.5–15.5)
WBC: 11.7 10*3/uL — ABNORMAL HIGH (ref 4.0–10.5)
nRBC: 0.3 % — ABNORMAL HIGH (ref 0.0–0.2)

## 2022-09-02 LAB — BASIC METABOLIC PANEL
Anion gap: 13 (ref 5–15)
BUN: 15 mg/dL (ref 8–23)
CO2: 26 mmol/L (ref 22–32)
Calcium: 9.9 mg/dL (ref 8.9–10.3)
Chloride: 103 mmol/L (ref 98–111)
Creatinine, Ser: 0.41 mg/dL — ABNORMAL LOW (ref 0.44–1.00)
GFR, Estimated: >60 mL/min (ref >60)
Glucose, Bld: 113 mg/dL — ABNORMAL HIGH (ref 70–99)
Potassium: 3.7 mmol/L (ref 3.5–5.1)
Sodium: 142 mmol/L (ref 135–145)

## 2022-09-02 LAB — MAGNESIUM: Magnesium: 1.8 mg/dL (ref 1.7–2.4)

## 2022-09-02 LAB — PHOSPHORUS: Phosphorus: 3.7 mg/dL (ref 2.5–4.6)

## 2022-09-02 MED ORDER — LABETALOL HCL 5 MG/ML IV SOLN: 10.0000 mg | INTRAVENOUS | Status: DC | PRN

## 2022-09-02 MED ORDER — SODIUM CHLORIDE 0.9 % IV SOLN: INTRAVENOUS | Status: DC

## 2022-09-02 MED ORDER — MAGNESIUM SULFATE 2 GM/50ML IV SOLN
2.0000 g | Freq: Once | INTRAVENOUS | Status: AC
  Administered 2022-09-02: 2 g via INTRAVENOUS
  Filled 2022-09-02: qty 50

## 2022-09-02 NOTE — Progress Notes (Signed)
Subjective: Patient reports no headaches, just tired  Objective: Vital signs in last 24 hours: Temp:  [97.8 F (36.6 C)-98.2 F (36.8 C)] 98.1 F (36.7 C) (10/18 0951) Pulse Rate:  [70-83] 79 (10/18 1104) Resp:  [6-31] 18 (10/18 1104) BP: (86-157)/(51-82) 86/62 (10/18 1104) SpO2:  [90 %-100 %] 95 % (10/18 1104)  Intake/Output from previous day: 10/17 0701 - 10/18 0700 In: 480 [P.O.:480] Out: 460 [Urine:450; Drains:10] Intake/Output this shift: No intake/output data recorded.  Neurologic: Grossly normal  Lab Results: Lab Results  Component Value Date   WBC 11.7 (H) 09/02/2022   HGB 9.0 (L) 09/02/2022   HCT 27.8 (L) 09/02/2022   MCV 77.4 (L) 09/02/2022   PLT 77 (L) 09/02/2022   Lab Results  Component Value Date   INR 1.5 (H) 09/07/2022   BMET Lab Results  Component Value Date   NA 142 09/02/2022   K 3.7 09/02/2022   CL 103 09/02/2022   CO2 26 09/02/2022   GLUCOSE 113 (H) 09/02/2022   BUN 15 09/02/2022   CREATININE 0.41 (L) 09/02/2022   CALCIUM 9.9 09/02/2022    Studies/Results: VAS [name] LOWER EXTREMITY VENOUS (DVT)  Result Date: 08/31/2022  Lower Venous DVT Study Patient Name:  Kristin Delgado  Date of Exam:   08/31/2022 Medical Rec #: [id number]     Accession #:    [id number] Date of Birth: [birth year]    Patient Gender: F Patient Age:   66 years Exam Location:  [facility] Procedure:      VAS [name] LOWER EXTREMITY VENOUS (DVT) Referring Phys: Glenford Peers --------------------------------------------------------------------------------  Indications: Right leg swelling.  Comparison Study: 03-07-21 Bilateral lower extremity venous study was negative                   for DVT. Performing Technologist: Darlin Coco RDMS, RVT  Examination Guidelines: A complete evaluation includes B-mode imaging, spectral Doppler, color Doppler, and power Doppler as needed of all accessible portions of each vessel. Bilateral testing is considered an integral part of a complete  examination. Limited examinations for reoccurring indications may be performed as noted. The reflux portion of the exam is performed with the patient in reverse Trendelenburg.  +---------+---------------+---------+-----------+----------+--------------+ RIGHT    CompressibilityPhasicitySpontaneityPropertiesThrombus Aging +---------+---------------+---------+-----------+----------+--------------+ CFV      Full           Yes      Yes                                 +---------+---------------+---------+-----------+----------+--------------+ SFJ      Full                                                        +---------+---------------+---------+-----------+----------+--------------+ FV Prox  Full                                                        +---------+---------------+---------+-----------+----------+--------------+ FV Mid   Full                                                        +---------+---------------+---------+-----------+----------+--------------+  FV DistalFull                                                        +---------+---------------+---------+-----------+----------+--------------+ PFV      Full                                                        +---------+---------------+---------+-----------+----------+--------------+ POP      Full           Yes      Yes                                 +---------+---------------+---------+-----------+----------+--------------+ PTV      Full                                                        +---------+---------------+---------+-----------+----------+--------------+ PERO     Full                                                        +---------+---------------+---------+-----------+----------+--------------+ Gastroc  Full                                                        +---------+---------------+---------+-----------+----------+--------------+    +----+---------------+---------+-----------+----------+--------------+ LEFTCompressibilityPhasicitySpontaneityPropertiesThrombus Aging +----+---------------+---------+-----------+----------+--------------+ CFV Full           Yes      Yes                                 +----+---------------+---------+-----------+----------+--------------+     Summary: RIGHT: - There is no evidence of deep vein thrombosis in the lower extremity.  - No cystic structure found in the popliteal fossa.  LEFT: - No evidence of common femoral vein obstruction.  *See table(s) above for measurements and observations. Electronically signed by Orlie Pollen on 08/31/2022 at 5:26:03 PM.    Final     Assessment/Plan: S/p crani ofr evac of SDH. Doing well, continue to mobilize and work on PO intake.    LOS: 3 days    Kristin Delgado 09/02/2022, 11:08 AM

## 2022-09-02 NOTE — Progress Notes (Signed)
Inpatient Rehab Admissions Coordinator:    I met with pt. To discuss potential CIR admit. She is interested and states that her husband can provide 24/7 support at d/c. I will follow for potential admit pending medical readiness.   Clemens Catholic, [place], [place] Admissions Coordinator  [phone] ([place]) [phone] (office)

## 2022-09-02 NOTE — TOC Initial Note (Signed)
Transition of Care [facility]) - Initial/Assessment Note    Patient Details  Name: Kristin Delgado MRN: [id number] Date of Birth: 1956-01-26  [facility] CM/SW Contact:    Pollie Friar, RN Phone Number: 09/02/2022, 2:16 PM  Clinical Narrative:                 Pt is s/p crani due to SDH. Recommendations are for CIR.  Pt states she lives at home with her spouse. Her daughter is there a lot also so she states she basically has 24 hour supervision. No DME at home.  Pt was managing her own medications at home.  Spouse provides the needed transportation.  Awaiting CIR work up.   Expected Discharge Plan: IP Rehab Facility Barriers to Discharge: Continued Medical Work up   Patient Goals and CMS Choice   CMS Medicare.gov Compare Post Acute Care list provided to:: Patient Choice offered to / list presented to : Patient, Spouse  Expected Discharge Plan and Services Expected Discharge Plan: [place]   Discharge Planning Services: CM Consult Post Acute Care Choice: IP Rehab Living arrangements for the past 2 months: Single Family Home                                      Prior Living Arrangements/Services Living arrangements for the past 2 months: Single Family Home Lives with:: Spouse Patient language and need for interpreter reviewed:: Yes Do you feel safe going back to the place where you live?: Yes      Need for Family Participation in Patient Care: Yes (Comment) Care giver support system in place?: Yes (comment)   Criminal Activity/Legal Involvement Pertinent to Current Situation/Hospitalization: No - Comment as needed  Activities of Daily Living      Permission Sought/Granted                  Emotional Assessment Appearance:: Appears stated age Attitude/Demeanor/Rapport: Engaged Affect (typically observed): Accepting Orientation: : Oriented to Self, Oriented to Place, Oriented to Situation   Psych Involvement: No  (comment)  Admission diagnosis:  Subdural hematoma ([place]) [S06.5XAA] Acute respiratory failure with hypoxia ([place]) [J96.01] Patient Active Problem List   Diagnosis Date Noted   Acute respiratory failure with hypoxia ([place])    Hypokalemia    Subdural hematoma ([place]) 09/14/2022   Irritable bowel syndrome with both constipation and diarrhea 08/18/2022   Dysphagia 08/04/2022   Type 2 diabetes mellitus with hyperglycemia, without long-term current use of insulin ([place]) 08/21/2021   Spinal stenosis of lumbar region 02/18/2021   Bulge of lumbar disc without myelopathy 02/18/2021   Left lumbar radiculopathy 01/09/2021   Suspicious nevus 08/20/2020   At risk for heart disease 11/01/2019   Vitamin D deficiency 11/01/2019   Insulin resistance 09/11/2019   Class 1 obesity with serious comorbidity and body mass index (BMI) of 30.0 to 30.9 in adult 09/11/2019   Pituitary adenoma ([place]) 01/30/2019   Arthritis of both hands 08/01/2018   Hyperlipidemia 07/15/2013   Sensorineural hearing loss, unilateral 03/07/2013   Cardiac murmur 06/14/2012   Depression with anxiety 02/12/2011   Essential hypertension 03/08/2007   PCP:  [name], DO Pharmacy:   [id number]: [phone]: [phone]    Social Determinants of Health (SDOH) Interventions    Readmission Risk Interventions  No data to display

## 2022-09-02 NOTE — Progress Notes (Signed)
Physical Therapy Treatment Patient Details Name: Kristin Delgado MRN: [id number] DOB: [birth year] Today's Date: 09/02/2022   History of Present Illness Pt is a 66 y.o. F who presents 08/21/2022 with weakness, lethargy/obtundation/confusion; found to have 2 cm SDH with left to right shift ultimately requiring craniotomy and evacuation. Self extubated 10/16. Significant PMH: HTN, DM2.    PT Comments    Session focused on transfer and gait training. Pt is demonstrating poor awareness of her R side, having difficulty tracking therapist laterally to the R and reporting her vision being abnormal, describing it as possible double vision. She did demonstrate improved R upper and lower extremity muscular activation with repetition of various tasks/functional mobility. For example, she displayed R knee buckling initially, but with gait training she was able to demonstrate improved knee stability by the 3rd rep. She fatigues quickly with SpO2 dropping to 85%, needing supplemental O2 to be increased from 2L to 3L for her SpO2 to rebound quickly to the 90s%. Will continue to follow acutely. Current recommendations remain appropriate.     Recommendations for follow up therapy are one component of a multi-disciplinary discharge planning process, led by the attending physician.  Recommendations may be updated based on patient status, additional functional criteria and insurance authorization.  Follow Up Recommendations  Acute inpatient rehab (3hours/day)     Assistance Recommended at Discharge Frequent or constant Supervision/Assistance  Patient can return home with the following Assistance with cooking/housework;Direct supervision/assist for medications management;Direct supervision/assist for financial management;Assist for transportation;Help with stairs or ramp for entrance;A lot of help with walking and/or transfers;A lot of help with bathing/dressing/bathroom   Equipment Recommendations  Rolling walker (2  wheels);BSC/3in1    Recommendations for Other Services       Precautions / Restrictions Precautions Precautions: Fall Restrictions Weight Bearing Restrictions: No     Mobility  Bed Mobility Overal bed mobility: Needs Assistance Bed Mobility: Supine to Sit     Supine to sit: Min assist, HOB elevated     General bed mobility comments: Cues provided for initiation, requiring assist to progress trunk to upright position and use of bed pad to scoot hip out to edge of bed    Transfers Overall transfer level: Needs assistance Equipment used: Rolling walker (2 wheels), None Transfers: Sit to/from Stand, Bed to chair/wheelchair/BSC Sit to Stand: Min assist, Mod assist   Step pivot transfers: Mod assist       General transfer comment: Pt provided repeated cues for each transfer to scoot to edge and attend to R hand to place on sitting surface to push up to stand. ModA to power up to stand and steady with first rep from EOB, minA for subsequent x2 reps from recliner. ModA for stability and R knee blocking to stand step to L bed > recliner, assisting R hand to RW.    Ambulation/Gait Ambulation/Gait assistance: Mod assist Gait Distance (Feet): 2 Feet Assistive device: Rolling walker (2 wheels) Gait Pattern/deviations: Step-to pattern, Decreased step length - right, Decreased step length - left, Decreased stance time - right, Decreased stride length, Decreased dorsiflexion - right, Decreased weight shift to right, Knees buckling, Trunk flexed Gait velocity: reduced Gait velocity interpretation: <1.31 ft/sec, indicative of household ambulator Pre-gait activities: x2 standing bouts. First bout cuing pt to take repeated steps anterior <> posterior with L leg while R foot remained in place, providing verbal and tactile cues to extend R knee during stance phase, improved carryover noted and less assistance noted with progressive repetition. Second bout, pt taking  1 step anterior <> posterior  with each leg, carrying over R knee extension/stability in stance from prior standing bout but still needing occasional cues. Min-modA throughout General Gait Details: Pt with R knee buckling noted, needing blocking during stance and cues for weight shifting. Hand-over-hand to direct R UE to RW grip. Cues to push through UEs and R knee into extension to take steps to L bed > recliner.   Stairs             Wheelchair Mobility    Modified Rankin (Stroke Patients Only) Modified Rankin (Stroke Patients Only) Pre-Morbid Rankin Score: No symptoms Modified Rankin: Moderately severe disability     Balance Overall balance assessment: Needs assistance Sitting-balance support: Feet supported Sitting balance-Leahy Scale: Fair     Standing balance support: Bilateral upper extremity supported Standing balance-Leahy Scale: Poor Standing balance comment: reliant on RW and external physical assistance                            Cognition Arousal/Alertness: Awake/alert Behavior During Therapy: WFL for tasks assessed/performed Overall Cognitive Status: Impaired/Different from baseline Area of Impairment: Problem solving, Awareness, Following commands, Memory, Attention                   Current Attention Level: Selective Memory: Decreased short-term memory Following Commands: Follows one step commands with increased time, Follows one step commands consistently, Follows multi-step commands inconsistently   Awareness: Emergent Problem Solving: Decreased initiation, Requires verbal cues, Slow processing, Difficulty sequencing, Requires tactile cues General Comments: Pt slow to process cues, needing repeated multi-modal cues for motor planning and sequencing. Benefits from visual targets to assist her in sequencing her steps. Repeated cues to extend her R knee in stance        Exercises      General Comments General comments (skin integrity, edema, etc.): SpO2 dropping  to 85% at times with mobility, increased [place] from 2L to 3L with pt rebounding to 90s% quickly when cued to perform pursed lip breathing; pt reported R eye issues that were bothering her, appeared to be described as double vision      Pertinent Vitals/Pain Pain Assessment Pain Assessment: No/denies pain    Home Living                          Prior Function            PT Goals (current goals can now be found in the care plan section) Acute Rehab PT Goals Patient Stated Goal: to improve PT Goal Formulation: With patient/family Time For Goal Achievement: 09/15/22 Potential to Achieve Goals: Good Progress towards PT goals: Progressing toward goals    Frequency    Min 4X/week      PT Plan Current plan remains appropriate    Co-evaluation              AM-PAC PT "6 Clicks" Mobility   Outcome Measure  Help needed turning from your back to your side while in a flat bed without using bedrails?: A Little Help needed moving from lying on your back to sitting on the side of a flat bed without using bedrails?: A Little Help needed moving to and from a bed to a chair (including a wheelchair)?: A Lot Help needed standing up from a chair using your arms (e.g., wheelchair or bedside chair)?: A Lot Help needed to walk in hospital room?: Total Help  needed climbing 3-5 steps with a railing? : Total 6 Click Score: 12    End of Session Equipment Utilized During Treatment: Gait belt;Oxygen Activity Tolerance: Patient tolerated treatment well Patient left: in chair;with call bell/phone within reach;with chair alarm set;with family/visitor present Nurse Communication: Mobility status;Other (comment) (vision issues) PT Visit Diagnosis: Unsteadiness on feet (R26.81);Difficulty in walking, not elsewhere classified (R26.2);Other abnormalities of gait and mobility (R26.89);Muscle weakness (generalized) (M62.81);Other symptoms and signs involving the nervous system (R29.898)      Time: 2548-6282 PT Time Calculation (min) (ACUTE ONLY): 41 min  Charges:  $Gait Training: 23-37 mins $Therapeutic Activity: 8-22 mins                     Moishe Spice, PT, DPT Acute Rehabilitation Services  Office: [place] 09/02/2022, 4:44 PM

## 2022-09-02 NOTE — Care Management Important Message (Signed)
Important Message  Patient Details  Name: Kristin Delgado MRN: [id number] Date of Birth: [birth year]   Medicare Important Message Given:  Yes     Orbie Pyo 09/02/2022, 2:58 PM

## 2022-09-02 NOTE — Progress Notes (Signed)
Patient complaining of visual changes, patient says vision is doubled and grey. MD Eliseo Squires notified. Birdena Jubilee with neurosurgery notified.

## 2022-09-02 NOTE — Progress Notes (Signed)
Per nursing, patient c/o vision changes after working with PT.  "Grayed out vision" per nursing.  Ordered orthostatics/BP check.  Also asked RN to notify NS. Eulogio Bear DO

## 2022-09-02 NOTE — Progress Notes (Signed)
[place] Progress Note Patient Name: Kristin Delgado DOB: March 06, 1956 MRN: [id number]   Date of Service  09/02/2022  HPI/Events of Note  Hypertension - Patient has been restarted on her home Lisinopril and [place]. SBP was in low 160's transiently, however, is not back down to 134/71. HR = 79.  eICU Interventions  Plan: Labetalol 10 mg IV Q 2 hours PRN SBP > 160 or DBP > 100.     Intervention Category Major Interventions: Hypertension - evaluation and management  Jayse Hodkinson [place] 09/02/2022, 12:13 AM

## 2022-09-02 NOTE — ED Provider Notes (Addendum)
[place]   [id number] 08/27/22 Arrival Time: 6063  ASSESSMENT & PLAN:  1. Dysphagia, unspecified type   2. Loss of weight   3. Acute non-recurrent maxillary sinusitis   4. Nausea without vomiting    Normal neurologic exam. No suspicion for ICH or SAH. No indication for neurodiagnostic imaging at this time. Discussed.  Meds ordered this encounter  Medications   cefdinir (OMNICEF) 300 MG capsule    Sig: Take 1 capsule (300 mg total) by mouth 2 (two) times daily.    Dispense:  20 capsule    Refill:  0   ondansetron (ZOFRAN-ODT) 4 MG disintegrating tablet    Sig: Take 1 tablet (4 mg total) by mouth every 8 (eight) hours as needed for nausea or vomiting.    Dispense:  15 tablet    Refill:  0   Recommend to enquire about swallowing study for dysphagia:  Follow-up Information     Schedule an appointment as soon as possible for a visit  with Carollee Herter, Alferd Apa, DO.   Specialty: Family Medicine Contact information: [address] [place] [address] [phone]                 Reviewed expectations re: course of current medical issues. Questions answered. Outlined signs and symptoms indicating need for more acute intervention. Patient verbalized understanding. After Visit Summary given.   SUBJECTIVE:  Kristin Delgado is a 66 y.o. female who reports cough and nasal congestion. First noted symptoms approx one month ago Pt states she takes OTC allergy medication and it helps most of the time.  Also reports trouble swallowing solid foods for the past 1-2 weeks; fine with liquids. Pt also states that she noticed a bruise on her left eye lid this morning. Pt  states that she noticed intermittent numbness on the left side of her face; does not feel currently. Pt denies any recent injury. Normal ambulation. Husband here; reports no mental status changes.   Social History   Substance and Sexual Activity  Alcohol Use No   Comment: rare    Social History   Tobacco Use  Smoking Status Never  Smokeless Tobacco Never    OBJECTIVE:  Vitals:   08/27/22 1101  BP: 139/82  Pulse: 86  Resp: 17  Temp: 98.2 F (36.8 C)  SpO2: 92%    General appearance: alert; no distress Eyes: PERRLA; EOMI; conjunctiva normal HENT: normocephalic; TMs normal; nasal mucosa normal; oral mucosa normal; with sub-cm slight bruise just lateral to L eye; no TTP; no significant swelling; is TTP over maxillary sinuses Neck: supple with FROM Lungs: clear to auscultation bilaterally Heart: regular Abdomen: soft, non-tender; bowel sounds normal Extremities: no cyanosis or edema; symmetrical with no gross deformities Skin: warm and dry Neurologic: normal gait; DTR's normal and symmetric; CN 2-12 grossly intact Psychological: alert and cooperative; normal mood and affect   Past Medical History:  Diagnosis Date   Allergy    seasonal   Anxiety    closed space like MRI   Back pain \   Chest pain 2006   admitted, essentially negative   Constipation    Depression    GERD (gastroesophageal reflux disease)    Hyperlipidemia    Hypertension    Infertility, female    Joint pain    Pituitary microadenoma (HCC)    Swallowing difficulty    Vestibular schwannoma (HCC)    Vitamin D deficiency    Social History   Socioeconomic History  Marital status: Married    Spouse name: Annie Main   Number of children: 3   Years of education: Not on file   Highest education level: Not on file  Occupational History   Occupation: [employer]  Tobacco Use   Smoking status: Never   Smokeless tobacco: Never  Vaping Use   Vaping Use: Never used  Substance and Sexual Activity   Alcohol use: No    Comment: rare   Drug use: No   Sexual activity: Not Currently    Partners: Male  Other Topics Concern   Not on file  Social History Narrative   Exercise-- daily --- walking, 3x a week DVD   Social Determinants of Health   Financial Resource Strain: Low  Risk  (12/22/2021)   Overall Financial Resource Strain (CARDIA)    Difficulty of Paying Living Expenses: Not hard at all  Food Insecurity: No Food Insecurity (12/22/2021)   Hunger Vital Sign    Worried About Running Out of Food in the Last Year: Never true    [place] in the Last Year: Never true  Transportation Needs: No Transportation Needs (12/22/2021)   PRAPARE - Hydrologist (Medical): No    Lack of Transportation (Non-Medical): No  Physical Activity: Inactive (12/22/2021)   Exercise Vital Sign    Days of Exercise per Week: 0 days    Minutes of Exercise per Session: 0 min  Stress: No Stress Concern Present (12/22/2021)   [place]    Feeling of Stress : Only a little  Social Connections: Moderately Integrated (12/22/2021)   Social Connection and Isolation Panel [NHANES]    Frequency of Communication with Friends and Family: Twice a week    Frequency of Social Gatherings with Friends and Family: Once a week    Attends Religious Services: 1 to 4 times per year    Active Member of [employer] or Organizations: No    Attends Archivist Meetings: Never    Marital Status: Married  Human resources officer Violence: Not At Risk (12/22/2021)   Humiliation, Afraid, Rape, and Kick questionnaire    Fear of Current or Ex-Partner: No    Emotionally Abused: No    Physically Abused: No    Sexually Abused: No   Family History  Problem Relation Age of Onset   Lung cancer Mother    Cancer Mother 60       lung    Alzheimer's disease Father    Dementia Father    Past Surgical History:  Procedure Laterality Date   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

## 2022-09-02 NOTE — PMR Pre-admission (Shared)
PMR Admission Coordinator Pre-Admission Assessment  Patient: Kristin Delgado is an 66 y.o., female MRN: [id number] DOB: [birth year] Height: '5\' 3"'  (160 cm) Weight: 76.3 kg  Insurance Information HMO:     PPO:      PCP:      IPA:      80/20: yes     OTHER:  PRIMARY: Medicare A and B      Policy#: [id number]      Subscriber: *** CM Name: ***      Phone#: ***     Fax#: *** Pre-Cert#: ***      Employer: *** Benefits:  Phone #: ***     Name: *** Eff. Date: ***     Deduct: ***      Out of Pocket Max: ***      Life Max: *** CIR: ***      SNF: *** Outpatient: ***     Co-Pay: *** Home Health: ***      Co-Pay: *** DME: ***     Co-Pay: *** Providers: *** SECONDARY: [employer] Managed      Policy#: [id number]     Phone#:   Financial Counselor:       Phone#:   The "Data Collection Information Summary" for patients in Inpatient Rehabilitation Facilities with attached "Privacy Act [place] Records" was provided and verbally reviewed with: Patient  Emergency Contact Information Contact Information     Name Relation Home Work Mobile   Kristin Delgado Spouse   [phone]       Current Medical History  Patient Admitting Diagnosis: SDH History of Present Illness: Kristin Delgado is a 66 y/o woman with a history of HTN, pituitary microadenoma who presented with confusion that has been progressively worsening for the past several days. She was too weak to get out of bed on 10/13 and had more lethargy on 10/14.  She presented to the ED 09/11/2022 as she was more obtunded and family brought her in . She she arrived she was very confused but responding to pain and weakly following some commands. For airway protection she was intubated with etomidate, rocuronium. She is not currently on AP or AC medications. Head CT demonstrated a 2cm SDH with L>R shift. Neurosurgery consulted and performed craniotomy and evacuation 09/14/2022. Self extubated 08/31/22. JP drain removed 10/17.  PT. Was seen by PT/OT who recommend  CIR to assist return to PLOF.   Complete NIHSS TOTAL: 22  Patient's medical record from [facility] has been reviewed by the rehabilitation admission coordinator and physician.  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 Yes  Family History   family history includes Alzheimer's disease in her father; Cancer (age of onset: 44) in her mother; Dementia in her father; Lung cancer in her mother.  Current Medications  Current Facility-Administered Medications:    0.9 %  sodium chloride infusion, , Intravenous, PRN, Julian Hy,  DO, Stopped at 08/31/22 1140   acetaminophen (TYLENOL) tablet 650 mg, 650 mg, Oral, Q4H PRN, Julian Hy, DO, 650 mg at 09/02/22 0138   atorvastatin (LIPITOR) tablet 20 mg, 20 mg, Oral, Delgado, Julian Hy, DO, 20 mg at 09/01/22 6195   cefdinir (OMNICEF) capsule 300 mg, 300 mg, Oral, Q12H, Julian Hy, DO, 300 mg at 09/01/22 2217   Chlorhexidine Gluconate Cloth 2 % PADS 6 each, 6 each, Topical, Delgado, Julian Hy, DO, 6 each at 09/01/22 1724   chlorthalidone (HYGROTON) tablet 25 mg, 25 mg, Oral, Delgado, Noemi Chapel P, DO, 25 mg at 09/01/22 0936   docusate sodium (COLACE) capsule 100 mg, 100 mg, Oral, BID, Julian Hy, DO, 100 mg at 09/01/22 0936   labetalol (NORMODYNE) injection 10 mg, 10 mg, Intravenous, Q2H PRN, Anders Simmonds, MD   lisinopril (ZESTRIL) tablet 10 mg, 10 mg, Oral, QHS, Julian Hy, DO, 10 mg at 09/01/22 2217   ondansetron (ZOFRAN)  injection 4 mg, 4 mg, Intravenous, Q6H PRN, Meyran, [name], NP, 4 mg at 09/01/22 1227   Oral care mouth rinse, 15 mL, Mouth Rinse, PRN, Noemi Chapel P, DO   pantoprazole (PROTONIX) EC tablet 40 mg, 40 mg, Oral, QHS, Clark, Briyah Wheelwright P, DO   polyethylene glycol (MIRALAX / GLYCOLAX) packet 17 g, 17 g, Oral, Delgado, Julian Hy, DO, 17 g at 09/01/22 0932  Patients Current Diet:  Diet Order             DIET DYS 3 Room service appropriate? Yes; Fluid consistency: Thin  Diet effective now                   Precautions / Restrictions Precautions Precautions: Fall Restrictions Weight Bearing Restrictions: No   Has the patient had 2 or more falls or a fall with injury in the past year? Yes  Prior Activity Level Community (5-7x/wk): Pt. active in the community PTA  Prior Functional Level Self Care: Did the patient need help bathing, dressing, using the toilet or eating? Independent  Indoor Mobility: Did the patient need assistance with walking from room to room (with or without device)? Independent  Stairs: Did the patient need assistance with internal or external stairs (with or without device)? Independent  Functional Cognition: Did the patient need help planning regular tasks such as shopping or remembering to take medications? Independent  Patient Information Are you of Hispanic, Latino/a,or Spanish origin?: A. No, not of Hispanic, Latino/a, or Spanish origin What is your race?: A. White Do you need or want an interpreter to communicate with a doctor or health care staff?: 0. No  Patient's Response To:  Health Literacy and Transportation Is the patient able to respond to health literacy and transportation needs?: Yes Health Literacy - How often do you need to have someone help you when you read instructions, pamphlets, or other written material from your doctor or pharmacy?: Never In the past 12 months, has lack of transportation kept you from medical appointments or  from getting medications?: No In the past 12 months, has lack of transportation kept you from meetings, work, or from getting things needed for Delgado living?: No  Home Assistive Devices / Equipment Home Equipment: Shower seat  Prior Device Use: Indicate devices/aids used by the patient prior to current illness, exacerbation or injury? None of the above  Current Functional Level Cognition  Overall Cognitive Status: Impaired/Different from baseline Orientation Level: Oriented X4 General Comments: Oriented to place, time,  and self. Requiring increased cues and time throughout. Following simple commands. Fatigues quickly    Extremity Assessment (includes Sensation/Coordination)  Upper Extremity Assessment: RUE deficits/detail RUE Deficits / Details: Decreased grasp and gross strength compare to LUE. Able to perform tasks such as opening graham cracker wrapping.  Lower Extremity Assessment: Defer to PT evaluation RLE Deficits / Details: Strength 5/5 LLE Deficits / Details: Strength 5/5 except hip flexion 4+/5    ADLs  Overall ADL's : Needs assistance/impaired Eating/Feeding: Set up, Sitting Eating/Feeding Details (indicate cue type and reason): Pt managing spoon and cup, drink with straw, and pening graham cracker packet Grooming: Set up, Sitting Upper Body Bathing: Minimal assistance, Sitting Upper Body Bathing Details (indicate cue type and reason): washing her back Lower Body Bathing: Moderate assistance, Sit to/from stand Upper Body Dressing : Minimal assistance, Sitting Lower Body Dressing: Moderate assistance, Sit to/from stand Toilet Transfer: Minimal assistance, Stand-pivot, Rolling walker (2 wheels) (simulated at Recliner) Toilet Transfer Details (indicate cue type and reason): Min A for power up and then to manage RW during pivot to EOB. Toileting- Clothing Manipulation and Hygiene: Minimal assistance, Sit to/from stand Toileting - Clothing Manipulation Details (indicate cue  type and reason): Pt performing peri care while in standing with MIn A for balance Functional mobility during ADLs: Minimal assistance, Rolling walker (2 wheels) (step pivot) General ADL Comments: Pt presenting with decreased processing, strength, balance, and activitry tolerance. Noting SpO2 dropping in standing and pt fatigues quickly    Mobility  Overal bed mobility: Needs Assistance Bed Mobility: Sit to Supine Supine to sit: Min assist Sit to supine: Min assist General bed mobility comments: Min A for facilitating BLEs over EOB    Transfers  Overall transfer level: Needs assistance Equipment used: Rolling walker (2 wheels), None Transfers: Sit to/from Stand, Bed to chair/wheelchair/BSC Sit to Stand: Min assist Bed to/from chair/wheelchair/BSC transfer type:: Step pivot Step pivot transfers: Min assist General transfer comment: Min A for power up and then to manage RW while pivoting to EOB    Ambulation / Gait / Stairs / Office manager / Balance Balance Overall balance assessment: Needs assistance Sitting-balance support: Feet supported Sitting balance-Leahy Scale: Fair Standing balance support: Bilateral upper extremity supported Standing balance-Leahy Scale: Poor Standing balance comment: reliant on RW    Special needs/care consideration Skin crani incision    Previous Home Environment (from acute therapy documentation) Living Arrangements: Spouse/significant other, Children (spouse, daughter)  Lives With: Spouse Available Help at Discharge: Family, Available 24 hours/day Type of Home: House Home Layout: One level Home Access: Stairs to enter Entrance Stairs-Rails: Right, Left Entrance Stairs-Number of Steps: 4 Bathroom Shower/Tub: Multimedia programmer: Standard  Discharge Living Setting Plans for Discharge Living Setting: Patient's home Type of Home at Discharge: House Discharge Home Layout: One level Discharge Home Access: Stairs to  enter Entrance Stairs-Rails: Right, Left Entrance Stairs-Number of Steps: 4 Discharge Bathroom Shower/Tub: Walk-in shower Discharge Bathroom Toilet: Standard Discharge Bathroom Accessibility: Yes How Accessible: Accessible via walker Does the patient have any problems obtaining your medications?: No  Social/Family/Support Systems Patient Roles: Spouse Contact Information: [phone] Anticipated Caregiver: Annie Main (husband) Ability/Limitations of Caregiver: min A Caregiver Availability: 24/7 Discharge Plan Discussed with Primary Caregiver: Yes Is Caregiver In Agreement with Plan?: Yes Does Caregiver/Family have Issues with Lodging/Transportation while Pt is in Rehab?: No  Goals Patient/Family Goal for Rehab: PT/OT/SLP Mod I Expected length of stay: 7-10 days Pt/Family Agrees to Admission and willing to participate:  Yes Program Orientation Provided & Reviewed with Pt/Caregiver Including Roles  & Responsibilities: No  Decrease burden of Care through IP rehab admission: Specialzed equipment needs, Decrease number of caregivers, and Patient/family education  Possible need for SNF placement upon discharge: n/a  Patient Condition: I have reviewed medical records from [facility] , spoken with CM, and patient. I met with patient at the bedside for inpatient rehabilitation assessment.  Patient will benefit from ongoing PT, OT, and SLP, can actively participate in 3 hours of therapy a day 5 days of the week, and can make measurable gains during the admission.  Patient will also benefit from the coordinated team approach during an Inpatient Acute Rehabilitation admission.  The patient will receive intensive therapy as well as Rehabilitation physician, nursing, social worker, and care management interventions.  Due to safety, skin/wound care, disease management, medication administration, pain management, and patient education the patient requires 24 hour a day rehabilitation  nursing.  The patient is currently Min A with mobility and basic ADLs.  Discharge setting and therapy post discharge at home with home health is anticipated.  Patient has agreed to participate in the Acute Inpatient Rehabilitation Program and will admit {Time; today/tomorrow:10263}.  Preadmission Screen Completed By:  Genella Mech, 09/02/2022 9:28 AM ______________________________________________________________________   Discussed status with Dr. [name] on *** at *** and received approval for admission today.  Admission Coordinator:  Genella Mech, CCC-SLP, time Marland KitchenSudie Grumbling ***   Assessment/Plan: Diagnosis: Does the need for close, 24 hr/day Medical supervision in concert with the patient's rehab needs make it unreasonable for this patient to be served in a less intensive setting? {yes_no_potentially:3041433} Co-Morbidities requiring supervision/potential complications: *** Due to {due OL:4103013}, does the patient require 24 hr/day rehab nursing? {yes_no_potentially:3041433} Does the patient require coordinated care of a physician, rehab nurse, PT, OT, and SLP to address physical and functional deficits in the context of the above medical diagnosis(es)? {yes_no_potentially:3041433} Addressing deficits in the following areas: {deficits:3041436} Can the patient actively participate in an intensive therapy program of at least 3 hrs of therapy 5 days a week? {yes_no_potentially:3041433} The potential for patient to make measurable gains while on inpatient rehab is {potential:3041437} Anticipated functional outcomes upon discharge from inpatient rehab: {functional outcomes:304600100} PT, {functional outcomes:304600100} OT, {functional outcomes:304600100} SLP Estimated rehab length of stay to reach the above functional goals is: *** Anticipated discharge destination: {anticipated dc setting:21604} 10. Overall Rehab/Functional Prognosis: {potential:3041437}   MD Signature: ***

## 2022-09-02 NOTE — Progress Notes (Signed)
PROGRESS NOTE    Kristin Delgado  SAY:[id number] DOB: 04/01/1956 DOA: 09/09/2022 PCP: [name], DO    Brief Narrative:  66 year old woman with hypertension who was admitted with weakness, lethargy/obtundation/confusion that required airway protection and intubation, ventilation.  She was found to have a 2 cm subdural hematoma with left-to-right shift, ultimately requiring craniotomy and evacuation 10/15.  She extubated 10/16.  JP drain removed 10/17. Being evaluated for CIR.     Assessment and Plan: Encephalopathy and impaired airway protection requiring endotracheal intubation and mechanical ventilation.   -Now extubated -on [place]-- wean to RA -incentive spirometry   Acute encephalopathy due to left subdural hematoma, brain compression.  -improved neurologically following left surgical evacuation.   -JP drain removed 10/17 -neurosurgery management.   -PT recommends CIR.   Sinusitis -continue and complete full course cefdinir.   Anemia and thrombocytopenia.  -trend   Baseline dysphagia -per PCCM: question esophageal stricture.   -SLP following  -will get esophagram once stable from acute events.        DVT prophylaxis: SCDs Start: 09/15/2022 1314    Code Status: Full Code Family Communication:   Disposition Plan:  Level of care: Progressive Status is: Inpatient Remains inpatient appropriate because: needs CIR placement    Consultants:  PCCM NS   Subjective: C/o feeling "stuffy"  Objective: Vitals:   09/02/22 0600 09/02/22 0700 09/02/22 0800 09/02/22 0951  BP: (!) 157/79 136/72 (!) 141/75 (!) 140/70  Pulse: 75 72 71   Resp: (!) 21 (!) '21 20 20  '$ Temp:    98.1 F (36.7 C)  TempSrc:    Oral  SpO2: 96% 95% 97% 99%  Weight:      Height:        Intake/Output Summary (Last 24 hours) at 09/02/2022 1011 Last data filed at 09/01/2022 2000 Gross per 24 hour  Intake 480 ml  Output 450 ml  Net 30 ml   Filed Weights   09/02/2022 1130 08/31/22  0417  Weight: 77.1 kg 76.3 kg    Examination:   General: Appearance:     Overweight female in no acute distress     Lungs:     respirations unlabored  Heart:    Normal heart rate.    MS:   All extremities are intact.    Neurologic:   Awake, alert, pleasant and cooperative       Data Reviewed: I have personally reviewed following labs and imaging studies  CBC: Recent Labs  Lab 08/23/2022 1135 09/14/2022 1143 08/31/2022 1336 09/12/2022 1922 08/31/22 0418 09/01/22 0415 09/02/22 0411  WBC 10.2  --   --  11.3* 10.5 10.8* 11.7*  NEUTROABS 7.7  --   --   --   --   --   --   HGB 11.9*   < > 10.9* 11.6* 9.6* 9.1* 9.0*  HCT 37.9   < > 32.0* 37.1 31.8* 30.0* 27.8*  MCV 79.6*  --   --  78.8* 82.2 81.3 77.4*  PLT 128*  --   --  91* 98* 88* 77*   < > = values in this interval not displayed.   Basic Metabolic Panel: Recent Labs  Lab 08/17/2022 1135 09/02/2022 1143 08/25/2022 1336 09/09/2022 1922 08/31/22 0418 09/01/22 0415 09/02/22 0411  NA 142 141 144 146* 146* 145 142  K 4.0 4.1 3.0* 3.7 3.4* 3.6 3.7  CL 102 104  --  110 109 110 103  CO2 29  --   --  20* 26 27  26  GLUCOSE 119* 116*  --  176* 130* 120* 113*  BUN 22 31*  --  '21 22 21 15  '$ CREATININE 0.67 0.60  --  0.65 0.68 0.55 0.41*  CALCIUM 9.7  --   --  8.9 8.8* 8.9 9.9  MG  --   --   --   --   --  2.0 1.8  PHOS  --   --   --   --   --  2.7 3.7   GFR: Estimated Creatinine Clearance: 66.8 mL/min (A) (by C-G formula based on SCr of 0.41 mg/dL (L)). Liver Function Tests: Recent Labs  Lab 08/25/2022 1135  AST 53*  ALT 28  ALKPHOS 120  BILITOT 1.0  PROT 6.3*  ALBUMIN 2.8*   No results for input(s): "LIPASE", "AMYLASE" in the last 168 hours. No results for input(s): "AMMONIA" in the last 168 hours. Coagulation Profile: Recent Labs  Lab 09/10/2022 1135  INR 1.5*   Cardiac Enzymes: No results for input(s): "CKTOTAL", "CKMB", "CKMBINDEX", "TROPONINI" in the last 168 hours. BNP (last 3 results) No results for input(s):  "PROBNP" in the last 8760 hours. HbA1C: No results for input(s): "HGBA1C" in the last 72 hours. CBG: Recent Labs  Lab 08/21/2022 1131  GLUCAP 120*   Lipid Profile: Recent Labs    08/31/22 0418  TRIG 397*   Thyroid Function Tests: Recent Labs    09/09/2022 1135  TSH 1.927   Anemia Panel: No results for input(s): "VITAMINB12", "FOLATE", "FERRITIN", "TIBC", "IRON", "RETICCTPCT" in the last 72 hours. Sepsis Labs: Recent Labs  Lab 09/09/2022 1135 08/25/2022 1640  LATICACIDVEN 1.7 1.2    Recent Results (from the past 240 hour(s))  MRSA Next Gen by PCR, Nasal     Status: None   Collection Time: 09/13/2022  3:49 PM   Specimen: Nasal Mucosa; Nasal Swab  Result Value Ref Range Status   MRSA by PCR Next Gen NOT DETECTED NOT DETECTED Final    Comment: (NOTE) The GeneXpert MRSA Assay (FDA approved for NASAL specimens only), is one component of a comprehensive MRSA colonization surveillance program. It is not intended to diagnose MRSA infection nor to guide or monitor treatment for MRSA infections. Test performance is not FDA approved in patients less than 34 years old. Performed at [facility], [place] [address], [place], [place] [address]   Culture, blood (Routine x 2)     Status: None (Preliminary result)   Collection Time: 08/27/2022  5:07 PM   Specimen: BLOOD  Result Value Ref Range Status   Specimen Description BLOOD SITE NOT SPECIFIED  Final   Special Requests   Final    BOTTLES DRAWN AEROBIC AND ANAEROBIC Blood Culture results may not be optimal due to an excessive volume of blood received in culture bottles   Culture   Final    NO GROWTH 3 DAYS Performed at [facility], [place] [address], [place], [place] [address]    Report Status PENDING  Incomplete  Culture, blood (Routine x 2)     Status: None (Preliminary result)   Collection Time: 09/12/2022  5:56 PM   Specimen: BLOOD RIGHT HAND  Result Value Ref Range Status   Specimen Description BLOOD RIGHT HAND  Final    Special Requests   Final    BOTTLES DRAWN AEROBIC ONLY Blood Culture adequate volume   Culture  Setup Time PENDING  Incomplete   Culture   Final    NO GROWTH 3 DAYS Performed at  [facility], [place] [address], [place], [place] [address]    Report Status PENDING  Incomplete         Radiology Studies: VAS [name] LOWER EXTREMITY VENOUS (DVT)  Result Date: 08/31/2022  Lower Venous DVT Study Patient Name:  EVANA RUNNELS  Date of Exam:   08/31/2022 Medical Rec #: [id number]     Accession #:    [id number] Date of Birth: 24-Dec-1955    Patient Gender: F Patient Age:   53 years Exam Location:  [facility] Procedure:      VAS [name] LOWER EXTREMITY VENOUS (DVT) Referring Phys: Glenford Peers --------------------------------------------------------------------------------  Indications: Right leg swelling.  Comparison Study: 03-07-21 Bilateral lower extremity venous study was negative                   for DVT. Performing Technologist: Darlin Coco RDMS, RVT  Examination Guidelines: A complete evaluation includes B-mode imaging, spectral Doppler, color Doppler, and power Doppler as needed of all accessible portions of each vessel. Bilateral testing is considered an integral part of a complete examination. Limited examinations for reoccurring indications may be performed as noted. The reflux portion of the exam is performed with the patient in reverse Trendelenburg.  +---------+---------------+---------+-----------+----------+--------------+ RIGHT    CompressibilityPhasicitySpontaneityPropertiesThrombus Aging +---------+---------------+---------+-----------+----------+--------------+ CFV      Full           Yes      Yes                                 +---------+---------------+---------+-----------+----------+--------------+ SFJ      Full                                                        +---------+---------------+---------+-----------+----------+--------------+ FV Prox  Full                                                         +---------+---------------+---------+-----------+----------+--------------+ FV Mid   Full                                                        +---------+---------------+---------+-----------+----------+--------------+ FV DistalFull                                                        +---------+---------------+---------+-----------+----------+--------------+ PFV      Full                                                        +---------+---------------+---------+-----------+----------+--------------+ POP      Full  Yes      Yes                                 +---------+---------------+---------+-----------+----------+--------------+ PTV      Full                                                        +---------+---------------+---------+-----------+----------+--------------+ PERO     Full                                                        +---------+---------------+---------+-----------+----------+--------------+ Gastroc  Full                                                        +---------+---------------+---------+-----------+----------+--------------+   +----+---------------+---------+-----------+----------+--------------+ LEFTCompressibilityPhasicitySpontaneityPropertiesThrombus Aging +----+---------------+---------+-----------+----------+--------------+ CFV Full           Yes      Yes                                 +----+---------------+---------+-----------+----------+--------------+     Summary: RIGHT: - There is no evidence of deep vein thrombosis in the lower extremity.  - No cystic structure found in the popliteal fossa.  LEFT: - No evidence of common femoral vein obstruction.  *See table(s) above for measurements and observations. Electronically signed by Orlie Pollen on 08/31/2022 at 5:26:03 PM.    Final         Scheduled Meds:  atorvastatin  20 mg Oral Daily    cefdinir  300 mg Oral Q12H   Chlorhexidine Gluconate Cloth  6 each Topical Daily   chlorthalidone  25 mg Oral Daily   docusate sodium  100 mg Oral BID   lisinopril  10 mg Oral QHS   pantoprazole  40 mg Oral QHS   polyethylene glycol  17 g Oral Daily   Continuous Infusions:  sodium chloride Stopped (08/31/22 1140)     LOS: 3 days    Time spent: 45 minutes spent on chart review, discussion with nursing staff, consultants, updating family and interview/physical exam; more than 50% of that time was spent in counseling and/or coordination of care.    Geradine Girt, DO [facility] Available via Epic secure chat 7am-7pm After these hours, please refer to coverage provider listed on [email or web address] 09/02/2022, 10:11 AM

## 2022-09-02 NOTE — Progress Notes (Signed)
Speech Language Pathology Treatment: Dysphagia  Patient Details Name: Kasia Trego MRN: [id number] DOB: 1956-05-25 Today's Date: 09/02/2022 Time: [id number] SLP Time Calculation (min) (ACUTE ONLY): 15 min  Assessment / Plan / Recommendation Clinical Impression  Pt is tolerating soft solids and thin liquids. Generally improving. SLP reviewed basic esophageal strategies, though pt mostly independent with these. Encouraged her to f/u with GI after d/c. No further SLP interventions needed for swallowing acutely. Will sign off.   HPI        SLP Plan  Continue with current plan of care      Recommendations for follow up therapy are one component of a multi-disciplinary discharge planning process, led by the attending physician.  Recommendations may be updated based on patient status, additional functional criteria and insurance authorization.    Recommendations  Diet recommendations: Thin liquid;Dysphagia 3 (mechanical soft) Liquids provided via: Cup;Straw Medication Administration: Crushed with puree Supervision: Patient able to self feed;Intermittent supervision to cue for compensatory strategies Compensations: Slow rate;Small sips/bites;Follow solids with liquid Postural Changes and/or Swallow Maneuvers: Seated upright 90 degrees;Upright 30-60 min after meal                Oral Care Recommendations: Oral care BID Follow Up Recommendations: No SLP follow up SLP Visit Diagnosis: Dysphagia, unspecified (R13.10) Plan: Continue with current plan of care           Darnel Mchan, Katherene Ponto  09/02/2022, 12:01 PM

## 2022-09-03 ENCOUNTER — Inpatient Hospital Stay (HOSPITAL_COMMUNITY): Payer: Medicare Other | Admitting: Anesthesiology

## 2022-09-03 ENCOUNTER — Other Ambulatory Visit: Payer: Self-pay

## 2022-09-03 ENCOUNTER — Encounter (HOSPITAL_COMMUNITY): Payer: Self-pay | Admitting: Anesthesiology

## 2022-09-03 ENCOUNTER — Encounter (HOSPITAL_COMMUNITY): Admission: EM | Disposition: E | Payer: Self-pay | Source: Home / Self Care | Attending: Pulmonary Disease

## 2022-09-03 ENCOUNTER — Inpatient Hospital Stay (HOSPITAL_COMMUNITY): Payer: Medicare Other

## 2022-09-03 ENCOUNTER — Encounter (HOSPITAL_COMMUNITY): Payer: Self-pay | Admitting: Critical Care Medicine

## 2022-09-03 ENCOUNTER — Other Ambulatory Visit (HOSPITAL_COMMUNITY): Payer: Self-pay

## 2022-09-03 DIAGNOSIS — S065XAA Traumatic subdural hemorrhage with loss of consciousness status unknown, initial encounter: Secondary | ICD-10-CM | POA: Diagnosis not present

## 2022-09-03 DIAGNOSIS — J9601 Acute respiratory failure with hypoxia: Secondary | ICD-10-CM | POA: Diagnosis not present

## 2022-09-03 DIAGNOSIS — E119 Type 2 diabetes mellitus without complications: Secondary | ICD-10-CM

## 2022-09-03 DIAGNOSIS — F418 Other specified anxiety disorders: Secondary | ICD-10-CM | POA: Diagnosis not present

## 2022-09-03 DIAGNOSIS — E1149 Type 2 diabetes mellitus with other diabetic neurological complication: Secondary | ICD-10-CM | POA: Diagnosis not present

## 2022-09-03 DIAGNOSIS — E876 Hypokalemia: Secondary | ICD-10-CM | POA: Diagnosis not present

## 2022-09-03 DIAGNOSIS — I2699 Other pulmonary embolism without acute cor pulmonale: Secondary | ICD-10-CM

## 2022-09-03 DIAGNOSIS — I10 Essential (primary) hypertension: Secondary | ICD-10-CM

## 2022-09-03 HISTORY — DX: Other pulmonary embolism without acute cor pulmonale: I26.99

## 2022-09-03 HISTORY — PX: BURR HOLE: SHX908

## 2022-09-03 LAB — CBC
HCT: 31.8 % — ABNORMAL LOW (ref 36.0–46.0)
HCT: 27.8 % — ABNORMAL LOW (ref 36.0–46.0)
Hemoglobin: 9.9 g/dL — ABNORMAL LOW (ref 12.0–15.0)
Hemoglobin: 8.8 g/dL — ABNORMAL LOW (ref 12.0–15.0)
MCH: 24.6 pg — ABNORMAL LOW (ref 26.0–34.0)
MCH: 25.1 pg — ABNORMAL LOW (ref 26.0–34.0)
MCHC: 31.1 g/dL (ref 30.0–36.0)
MCHC: 31.7 g/dL (ref 30.0–36.0)
MCV: 78.9 fL — ABNORMAL LOW (ref 80.0–100.0)
MCV: 79.4 fL — ABNORMAL LOW (ref 80.0–100.0)
Platelets: 97 10*3/uL — ABNORMAL LOW (ref 150–400)
Platelets: 92 10*3/uL — ABNORMAL LOW (ref 150–400)
RBC: 4.03 MIL/uL (ref 3.87–5.11)
RBC: 3.5 MIL/uL — ABNORMAL LOW (ref 3.87–5.11)
RDW: 13.8 % (ref 11.5–15.5)
RDW: 13.9 % (ref 11.5–15.5)
WBC: 12.5 10*3/uL — ABNORMAL HIGH (ref 4.0–10.5)
WBC: 12.4 10*3/uL — ABNORMAL HIGH (ref 4.0–10.5)
nRBC: 0.7 % — ABNORMAL HIGH (ref 0.0–0.2)
nRBC: 0.9 % — ABNORMAL HIGH (ref 0.0–0.2)

## 2022-09-03 LAB — TYPE AND SCREEN
ABO/RH(D): AB POS
Antibody Screen: NEGATIVE

## 2022-09-03 LAB — POCT I-STAT 7, (LYTES, BLD GAS, ICA,H+H)
Acid-Base Excess: 1 mmol/L (ref 0.0–2.0)
Bicarbonate: 25.2 mmol/L (ref 20.0–28.0)
Calcium, Ion: 1.2 mmol/L (ref 1.15–1.40)
HCT: 24 % — ABNORMAL LOW (ref 36.0–46.0)
Hemoglobin: 8.2 g/dL — ABNORMAL LOW (ref 12.0–15.0)
O2 Saturation: 100 %
Patient temperature: 99.8
Potassium: 3.7 mmol/L (ref 3.5–5.1)
Sodium: 137 mmol/L (ref 135–145)
TCO2: 26 mmol/L (ref 22–32)
pCO2 arterial: 36.4 mmHg (ref 32–48)
pH, Arterial: 7.451 — ABNORMAL HIGH (ref 7.35–7.45)
pO2, Arterial: 246 mmHg — ABNORMAL HIGH (ref 83–108)

## 2022-09-03 LAB — COMPREHENSIVE METABOLIC PANEL
ALT: 29 U/L (ref 0–44)
AST: 46 U/L — ABNORMAL HIGH (ref 15–41)
Albumin: 2.2 g/dL — ABNORMAL LOW (ref 3.5–5.0)
Alkaline Phosphatase: 133 U/L — ABNORMAL HIGH (ref 38–126)
Anion gap: 11 (ref 5–15)
BUN: 16 mg/dL (ref 8–23)
CO2: 26 mmol/L (ref 22–32)
Calcium: 8.8 mg/dL — ABNORMAL LOW (ref 8.9–10.3)
Chloride: 103 mmol/L (ref 98–111)
Creatinine, Ser: 0.66 mg/dL (ref 0.44–1.00)
GFR, Estimated: >60 mL/min (ref >60)
Glucose, Bld: 125 mg/dL — ABNORMAL HIGH (ref 70–99)
Potassium: 3.7 mmol/L (ref 3.5–5.1)
Sodium: 140 mmol/L (ref 135–145)
Total Bilirubin: 0.5 mg/dL (ref 0.3–1.2)
Total Protein: 5.3 g/dL — ABNORMAL LOW (ref 6.5–8.1)

## 2022-09-03 LAB — BASIC METABOLIC PANEL
Anion gap: 15 (ref 5–15)
BUN: 14 mg/dL (ref 8–23)
CO2: 24 mmol/L (ref 22–32)
Calcium: 9.4 mg/dL (ref 8.9–10.3)
Chloride: 99 mmol/L (ref 98–111)
Creatinine, Ser: 0.55 mg/dL (ref 0.44–1.00)
GFR, Estimated: >60 mL/min (ref >60)
Glucose, Bld: 119 mg/dL — ABNORMAL HIGH (ref 70–99)
Potassium: 3.5 mmol/L (ref 3.5–5.1)
Sodium: 138 mmol/L (ref 135–145)

## 2022-09-03 LAB — PHOSPHORUS: Phosphorus: 5.9 mg/dL — ABNORMAL HIGH (ref 2.5–4.6)

## 2022-09-03 LAB — MAGNESIUM: Magnesium: 1.9 mg/dL (ref 1.7–2.4)

## 2022-09-03 LAB — APTT: aPTT: 37 seconds — ABNORMAL HIGH (ref 24–36)

## 2022-09-03 LAB — PROTIME-INR
INR: 1.7 — ABNORMAL HIGH (ref 0.8–1.2)
Prothrombin Time: 20 seconds — ABNORMAL HIGH (ref 11.4–15.2)

## 2022-09-03 LAB — LACTIC ACID, PLASMA
Lactic Acid, Venous: 1.6 mmol/L (ref 0.5–1.9)
Lactic Acid, Venous: 1.9 mmol/L (ref 0.5–1.9)

## 2022-09-03 SURGERY — CREATION, CRANIAL BURR HOLE
Anesthesia: General | Site: Head | Laterality: Left

## 2022-09-03 MED ORDER — LIDOCAINE 2% (20 MG/ML) 5 ML SYRINGE
INTRAMUSCULAR | Status: DC | PRN
  Administered 2022-09-03: 60 mg via INTRAVENOUS

## 2022-09-03 MED ORDER — CEFAZOLIN SODIUM-DEXTROSE 2-3 GM-%(50ML) IV SOLR
INTRAVENOUS | Status: DC | PRN
  Administered 2022-09-03: 2 g via INTRAVENOUS

## 2022-09-03 MED ORDER — SODIUM CHLORIDE 0.9 % IV SOLN: INTRAVENOUS | Status: DC

## 2022-09-03 MED ORDER — PROPOFOL 1000 MG/100ML IV EMUL
0.0000 ug/kg/min | INTRAVENOUS | Status: DC
  Administered 2022-09-03: 35 ug/kg/min via INTRAVENOUS
  Administered 2022-09-04: 30 ug/kg/min via INTRAVENOUS
  Administered 2022-09-04: 5 ug/kg/min via INTRAVENOUS
  Filled 2022-09-03 (×3): qty 100

## 2022-09-03 MED ORDER — BACITRACIN ZINC 500 UNIT/GM EX OINT
TOPICAL_OINTMENT | CUTANEOUS | Status: AC
  Filled 2022-09-03: qty 28.35

## 2022-09-03 MED ORDER — PHENYLEPHRINE HCL-NACL 20-0.9 MG/250ML-% IV SOLN
INTRAVENOUS | Status: DC | PRN
  Administered 2022-09-03: 30 ug/min via INTRAVENOUS

## 2022-09-03 MED ORDER — FENTANYL CITRATE PF 50 MCG/ML IJ SOSY: 25.0000 ug | PREFILLED_SYRINGE | INTRAMUSCULAR | Status: DC | PRN

## 2022-09-03 MED ORDER — LIDOCAINE-EPINEPHRINE 1 %-1:100000 IJ SOLN
INTRAMUSCULAR | Status: DC | PRN
  Administered 2022-09-03: 10 mL

## 2022-09-03 MED ORDER — PANTOPRAZOLE SODIUM 40 MG PO TBEC: 40.0000 mg | DELAYED_RELEASE_TABLET | Freq: Two times a day (BID) | ORAL | Status: DC

## 2022-09-03 MED ORDER — METHYLPREDNISOLONE 4 MG PO TBPK: 8.0000 mg | ORAL_TABLET | Freq: Every evening | ORAL | Status: DC

## 2022-09-03 MED ORDER — LEVETIRACETAM IN NACL 500 MG/100ML IV SOLN
500.0000 mg | Freq: Two times a day (BID) | INTRAVENOUS | Status: DC
  Administered 2022-09-03 – 2022-09-05 (×5): 500 mg via INTRAVENOUS
  Filled 2022-09-03 (×5): qty 100

## 2022-09-03 MED ORDER — DEXAMETHASONE SODIUM PHOSPHATE 10 MG/ML IJ SOLN
INTRAMUSCULAR | Status: AC
  Filled 2022-09-03: qty 1

## 2022-09-03 MED ORDER — PROPOFOL 10 MG/ML IV BOLUS
INTRAVENOUS | Status: DC | PRN
  Administered 2022-09-03: 130 mg via INTRAVENOUS

## 2022-09-03 MED ORDER — FENTANYL CITRATE (PF) 250 MCG/5ML IJ SOLN
INTRAMUSCULAR | Status: DC | PRN
  Administered 2022-09-03: 100 ug via INTRAVENOUS
  Administered 2022-09-03: 50 ug via INTRAVENOUS

## 2022-09-03 MED ORDER — POTASSIUM CHLORIDE 10 MEQ/100ML IV SOLN
10.0000 meq | INTRAVENOUS | Status: AC
  Administered 2022-09-03 – 2022-09-04 (×4): 10 meq via INTRAVENOUS
  Filled 2022-09-03 (×4): qty 100

## 2022-09-03 MED ORDER — CLEVIDIPINE BUTYRATE 0.5 MG/ML IV EMUL
0.0000 mg/h | INTRAVENOUS | Status: DC
  Administered 2022-09-04: 1 mg/h via INTRAVENOUS
  Filled 2022-09-03: qty 50

## 2022-09-03 MED ORDER — METHYLPREDNISOLONE 4 MG PO TBPK: 4.0000 mg | ORAL_TABLET | ORAL | Status: DC

## 2022-09-03 MED ORDER — ORAL CARE MOUTH RINSE
15.0000 mL | OROMUCOSAL | Status: DC
  Administered 2022-09-03 – 2022-09-06 (×26): 15 mL via OROMUCOSAL

## 2022-09-03 MED ORDER — DOCUSATE SODIUM 50 MG/5ML PO LIQD
100.0000 mg | Freq: Two times a day (BID) | ORAL | Status: DC
  Administered 2022-09-04 – 2022-09-05 (×2): 100 mg
  Filled 2022-09-03 (×2): qty 10

## 2022-09-03 MED ORDER — PANTOPRAZOLE 2 MG/ML SUSPENSION: 40.0000 mg | Freq: Two times a day (BID) | ORAL | Status: DC

## 2022-09-03 MED ORDER — ORAL CARE MOUTH RINSE: 15.0000 mL | OROMUCOSAL | Status: DC | PRN

## 2022-09-03 MED ORDER — DOCUSATE SODIUM 100 MG PO CAPS: 100.0000 mg | ORAL_CAPSULE | Freq: Two times a day (BID) | ORAL | Status: DC | PRN

## 2022-09-03 MED ORDER — METHYLPREDNISOLONE 4 MG PO TBPK: 4.0000 mg | ORAL_TABLET | Freq: Four times a day (QID) | ORAL | Status: DC

## 2022-09-03 MED ORDER — METHYLPREDNISOLONE 4 MG PO TBPK: 4.0000 mg | ORAL_TABLET | Freq: Three times a day (TID) | ORAL | Status: DC

## 2022-09-03 MED ORDER — PROPOFOL 500 MG/50ML IV EMUL
INTRAVENOUS | Status: DC | PRN
  Administered 2022-09-03: 25 ug/kg/min via INTRAVENOUS

## 2022-09-03 MED ORDER — LIDOCAINE-EPINEPHRINE 1 %-1:100000 IJ SOLN
INTRAMUSCULAR | Status: AC
  Filled 2022-09-03: qty 1

## 2022-09-03 MED ORDER — ROCURONIUM BROMIDE 10 MG/ML (PF) SYRINGE
PREFILLED_SYRINGE | INTRAVENOUS | Status: DC | PRN
  Administered 2022-09-03: 60 mg via INTRAVENOUS
  Administered 2022-09-03: 40 mg via INTRAVENOUS

## 2022-09-03 MED ORDER — SODIUM CHLORIDE 0.9 % IV SOLN
250.0000 mL | INTRAVENOUS | Status: DC
  Administered 2022-09-04: 250 mL via INTRAVENOUS

## 2022-09-03 MED ORDER — 0.9 % SODIUM CHLORIDE (POUR BTL) OPTIME
TOPICAL | Status: DC | PRN
  Administered 2022-09-03: 2000 mL

## 2022-09-03 MED ORDER — ONDANSETRON HCL 4 MG/2ML IJ SOLN
INTRAMUSCULAR | Status: DC | PRN
  Administered 2022-09-03: 4 mg via INTRAVENOUS

## 2022-09-03 MED ORDER — IOHEXOL 350 MG/ML SOLN
75.0000 mL | Freq: Once | INTRAVENOUS | Status: AC | PRN
  Administered 2022-09-03: 75 mL via INTRAVENOUS

## 2022-09-03 MED ORDER — PHENYLEPHRINE HCL-NACL 20-0.9 MG/250ML-% IV SOLN: 25.0000 ug/min | INTRAVENOUS | Status: DC

## 2022-09-03 MED ORDER — POLYETHYLENE GLYCOL 3350 17 G PO PACK: 17.0000 g | PACK | Freq: Every day | ORAL | Status: DC | PRN

## 2022-09-03 MED ORDER — ARTIFICIAL TEARS OPHTHALMIC OINT
TOPICAL_OINTMENT | OPHTHALMIC | Status: AC
  Filled 2022-09-03: qty 3.5

## 2022-09-03 MED ORDER — DEXAMETHASONE SODIUM PHOSPHATE 10 MG/ML IJ SOLN
INTRAMUSCULAR | Status: DC | PRN
  Administered 2022-09-03: 10 mg via INTRAVENOUS

## 2022-09-03 MED ORDER — THROMBIN 20000 UNITS EX SOLR
CUTANEOUS | Status: AC
  Filled 2022-09-03: qty 20000

## 2022-09-03 MED ORDER — LISINOPRIL 10 MG PO TABS
10.0000 mg | ORAL_TABLET | Freq: Two times a day (BID) | ORAL | Status: DC
  Administered 2022-09-04 – 2022-09-05 (×2): 10 mg via ORAL
  Filled 2022-09-03 (×5): qty 1

## 2022-09-03 MED ORDER — PANTOPRAZOLE SODIUM 40 MG IV SOLR
40.0000 mg | Freq: Two times a day (BID) | INTRAVENOUS | Status: DC
  Administered 2022-09-03 – 2022-09-05 (×4): 40 mg via INTRAVENOUS
  Filled 2022-09-03 (×3): qty 10

## 2022-09-03 MED ORDER — ONDANSETRON HCL 4 MG/2ML IJ SOLN
INTRAMUSCULAR | Status: AC
  Filled 2022-09-03: qty 2

## 2022-09-03 MED ORDER — FENTANYL CITRATE (PF) 250 MCG/5ML IJ SOLN
INTRAMUSCULAR | Status: AC
  Filled 2022-09-03: qty 5

## 2022-09-03 MED ORDER — POLYETHYLENE GLYCOL 3350 17 G PO PACK: 17.0000 g | PACK | Freq: Every day | ORAL | Status: DC

## 2022-09-03 MED ORDER — DEXAMETHASONE SODIUM PHOSPHATE 10 MG/ML IJ SOLN
10.0000 mg | Freq: Four times a day (QID) | INTRAMUSCULAR | Status: DC
  Administered 2022-09-03 – 2022-09-05 (×10): 10 mg via INTRAVENOUS
  Filled 2022-09-03 (×10): qty 1

## 2022-09-03 MED ORDER — THROMBIN 20000 UNITS EX SOLR: CUTANEOUS | Status: DC | PRN

## 2022-09-03 MED ORDER — PHENYLEPHRINE HCL-NACL 20-0.9 MG/250ML-% IV SOLN: 0.0000 ug/min | INTRAVENOUS | Status: DC

## 2022-09-03 MED ORDER — METHYLPREDNISOLONE 4 MG PO TBPK
8.0000 mg | ORAL_TABLET | Freq: Every morning | ORAL | Status: DC
  Filled 2022-09-03: qty 21

## 2022-09-03 SURGICAL SUPPLY — 54 items
ADH SKN CLS APL DERMABOND .7 (GAUZE/BANDAGES/DRESSINGS) ×1
BAG COUNTER SPONGE SURGICOUNT (BAG) ×2 IMPLANT
BAG SPNG CNTER NS LX DISP (BAG) ×1
BLADE CLIPPER SURG (BLADE) IMPLANT
BLADE SURG 11 STRL SS (BLADE) ×2 IMPLANT
BNDG COHESIVE 4X5 TAN STRL (GAUZE/BANDAGES/DRESSINGS) IMPLANT
BNDG GAUZE DERMACEA FLUFF 4 (GAUZE/BANDAGES/DRESSINGS) IMPLANT
BNDG GZE DERMACEA 4 6PLY (GAUZE/BANDAGES/DRESSINGS)
BUR ACORN 9.0 PRECISION (BURR) ×2 IMPLANT
CANISTER SUCT 3000ML PPV (MISCELLANEOUS) ×2 IMPLANT
CLIP VESOCCLUDE MED 6/CT (CLIP) IMPLANT
DERMABOND ADVANCED .7 DNX12 (GAUZE/BANDAGES/DRESSINGS) ×2 IMPLANT
DRAPE NEUROLOGICAL W/INCISE (DRAPES) IMPLANT
DRAPE SURG 17X23 STRL (DRAPES) IMPLANT
DRAPE WARM FLUID 44X44 (DRAPES) ×2 IMPLANT
DRSG OPSITE POSTOP 4X6 (GAUZE/BANDAGES/DRESSINGS) IMPLANT
ELECT REM PT RETURN 9FT ADLT (ELECTROSURGICAL) ×1
ELECTRODE REM PT RTRN 9FT ADLT (ELECTROSURGICAL) ×2 IMPLANT
GAUZE 4X4 16PLY ~~LOC~~+RFID DBL (SPONGE) IMPLANT
GAUZE SPONGE 4X4 12PLY STRL (GAUZE/BANDAGES/DRESSINGS) IMPLANT
GLOVE BIO SURGEON STRL SZ7 (GLOVE) IMPLANT
GLOVE BIO SURGEON STRL SZ8 (GLOVE) ×2 IMPLANT
GLOVE BIOGEL PI IND STRL 7.0 (GLOVE) IMPLANT
GLOVE EXAM NITRILE XL STR (GLOVE) IMPLANT
GLOVE INDICATOR 8.5 STRL (GLOVE) ×4 IMPLANT
GOWN STRL REUS W/ TWL LRG LVL3 (GOWN DISPOSABLE) ×2 IMPLANT
GOWN STRL REUS W/ TWL XL LVL3 (GOWN DISPOSABLE) ×2 IMPLANT
GOWN STRL REUS W/TWL 2XL LVL3 (GOWN DISPOSABLE) ×2 IMPLANT
GOWN STRL REUS W/TWL LRG LVL3 (GOWN DISPOSABLE) ×1
GOWN STRL REUS W/TWL XL LVL3 (GOWN DISPOSABLE) ×1
HEMOSTAT SURGICEL 2X14 (HEMOSTASIS) IMPLANT
HOOK DURA (MISCELLANEOUS) ×2 IMPLANT
KIT BASIN OR (CUSTOM PROCEDURE TRAY) ×2 IMPLANT
KIT TURNOVER KIT B (KITS) ×2 IMPLANT
NDL HYPO 25X1 1.5 SAFETY (NEEDLE) ×2 IMPLANT
NEEDLE HYPO 25X1 1.5 SAFETY (NEEDLE) ×1 IMPLANT
NS IRRIG 1000ML POUR BTL (IV SOLUTION) ×2 IMPLANT
PACK CRANIOTOMY CUSTOM (CUSTOM PROCEDURE TRAY) ×2 IMPLANT
PAD ARMBOARD 7.5X6 YLW CONV (MISCELLANEOUS) ×6 IMPLANT
PATTIES SURGICAL .25X.25 (GAUZE/BANDAGES/DRESSINGS) IMPLANT
PATTIES SURGICAL .5 X.5 (GAUZE/BANDAGES/DRESSINGS) IMPLANT
PATTIES SURGICAL .5 X3 (DISPOSABLE) IMPLANT
PATTIES SURGICAL 1X1 (DISPOSABLE) IMPLANT
PIN MAYFIELD SKULL DISP (PIN) IMPLANT
SPIKE FLUID TRANSFER (MISCELLANEOUS) ×2 IMPLANT
SPONGE NEURO XRAY DETECT 1X3 (DISPOSABLE) IMPLANT
SPONGE SURGIFOAM ABS GEL 100 (HEMOSTASIS) IMPLANT
STAPLER VISISTAT 35W (STAPLE) ×2 IMPLANT
SUT NURALON 4 0 TR CR/8 (SUTURE) ×4 IMPLANT
SUT VIC AB 2-0 CT1 18 (SUTURE) ×2 IMPLANT
TOWEL GREEN STERILE (TOWEL DISPOSABLE) ×2 IMPLANT
TOWEL GREEN STERILE FF (TOWEL DISPOSABLE) ×2 IMPLANT
TRAY FOLEY MTR SLVR 16FR STAT (SET/KITS/TRAYS/PACK) IMPLANT
WATER STERILE IRR 1000ML POUR (IV SOLUTION) ×2 IMPLANT
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
## 2022-09-03 NOTE — Progress Notes (Addendum)
PROGRESS NOTE    Kristin Delgado  AOZ:[id number] DOB: Jul 20, 1956 DOA: 08/27/2022 PCP: [name], DO    Brief Narrative:  66 year old woman with hypertension who was admitted with weakness, lethargy/obtundation/confusion that required airway protection and intubation, ventilation.  She was found to have a 2 cm subdural hematoma with left-to-right shift, ultimately requiring craniotomy and evacuation 10/15.  She extubated 10/16.  JP drain removed 10/17. 10/19 developed some somnolence-- CT scan done that was abnormal-- may need to return to the OR.  Being evaluated for CIR.     Assessment and Plan: Encephalopathy and impaired airway protection requiring endotracheal intubation and mechanical ventilation.   -Now extubated -on [place]-- wean to RA -incentive spirometry   Acute encephalopathy due to left subdural hematoma, brain compression.  -JP drain removed 10/17 -neurosurgery management: repeat CT Scan shows: reaccumulation of a centimeter of chronic subdural fluid could be CSF and external hydrocephalus.  Consider redo bur hole versus embolization.  We will monitor keep the patient n.p.o. also possibility this could be some subclinical seizure activity we will start on Keppra just in case.  -PT recommends CIR.   Sinusitis -continue and complete full course cefdinir.   Anemia and thrombocytopenia.  -trend   Baseline dysphagia -per PCCM: question esophageal stricture.    -will get esophagram once stable from acute events. -will see if cor track can be placed tomorrow       DVT prophylaxis: SCDs Start: 09/12/2022 1314    Code Status: Full Code Family Communication: husband/other family at bedside  Disposition Plan:  Level of care: Progressive Status is: Inpatient Remains inpatient appropriate because: needs CIR placement    Consultants:  PCCM NS   Subjective: Less interactive today but will open eyes to voice  Objective: Vitals:   09/02/22 2004 08/20/2022  0316 08/19/2022 0500 09/09/2022 0728  BP: (!) 147/82 (!) 160/94  (!) 156/88  Pulse: 93 89  87  Resp: '17 17  19  '$ Temp: 100.2 F (37.9 C) 98.2 F (36.8 C)  (!) 97 F (36.1 C)  TempSrc: Oral Oral  Axillary  SpO2: (!) 85% 92%  95%  Weight:   73.4 kg   Height:       No intake or output data in the 24 hours ending 09/08/2022 1103  Filed Weights   08/17/2022 1130 08/31/22 0417 09/09/2022 0500  Weight: 77.1 kg 76.3 kg 73.4 kg    Examination:    General: Appearance:     Overweight female in no acute distress     Lungs:     respirations unlabored, but now requiring O2  Heart:    Normal heart rate.   MS:   All extremities are intact.   Neurologic:   Will awaken, moves all 4 ext but not following commands         Data Reviewed: I have personally reviewed following labs and imaging studies  CBC: Recent Labs  Lab 09/09/2022 1135 08/29/2022 1143 09/10/2022 1922 08/31/22 0418 09/01/22 0415 09/02/22 0411 08/23/2022 0457  WBC 10.2  --  11.3* 10.5 10.8* 11.7* 12.5*  NEUTROABS 7.7  --   --   --   --   --   --   HGB 11.9*   < > 11.6* 9.6* 9.1* 9.0* 9.9*  HCT 37.9   < > 37.1 31.8* 30.0* 27.8* 31.8*  MCV 79.6*  --  78.8* 82.2 81.3 77.4* 78.9*  PLT 128*  --  91* 98* 88* 77* 97*   < > = values  in this interval not displayed.   Basic Metabolic Panel: Recent Labs  Lab 09/13/2022 1922 08/31/22 0418 09/01/22 0415 09/02/22 0411 08/16/2022 0457  NA 146* 146* 145 142 138  K 3.7 3.4* 3.6 3.7 3.5  CL 110 109 110 103 99  CO2 20* '26 27 26 24  '$ GLUCOSE 176* 130* 120* 113* 119*  BUN '21 22 21 15 14  '$ CREATININE 0.65 0.68 0.55 0.41* 0.55  CALCIUM 8.9 8.8* 8.9 9.9 9.4  MG  --   --  2.0 1.8  --   PHOS  --   --  2.7 3.7  --    GFR: Estimated Creatinine Clearance: 65.5 mL/min (by C-G formula based on SCr of 0.55 mg/dL). Liver Function Tests: Recent Labs  Lab 09/08/2022 1135  AST 53*  ALT 28  ALKPHOS 120  BILITOT 1.0  PROT 6.3*  ALBUMIN 2.8*   No results for input(s): "LIPASE", "AMYLASE" in the last  168 hours. No results for input(s): "AMMONIA" in the last 168 hours. Coagulation Profile: Recent Labs  Lab 09/08/2022 1135  INR 1.5*   Cardiac Enzymes: No results for input(s): "CKTOTAL", "CKMB", "CKMBINDEX", "TROPONINI" in the last 168 hours. BNP (last 3 results) No results for input(s): "PROBNP" in the last 8760 hours. HbA1C: No results for input(s): "HGBA1C" in the last 72 hours. CBG: Recent Labs  Lab 09/04/2022 1131  GLUCAP 120*   Lipid Profile: No results for input(s): "CHOL", "HDL", "LDLCALC", "TRIG", "CHOLHDL", "LDLDIRECT" in the last 72 hours.  Thyroid Function Tests: No results for input(s): "TSH", "T4TOTAL", "FREET4", "T3FREE", "THYROIDAB" in the last 72 hours.  Anemia Panel: No results for input(s): "VITAMINB12", "FOLATE", "FERRITIN", "TIBC", "IRON", "RETICCTPCT" in the last 72 hours. Sepsis Labs: Recent Labs  Lab 09/02/2022 1135 08/24/2022 1640  LATICACIDVEN 1.7 1.2    Recent Results (from the past 240 hour(s))  MRSA Next Gen by PCR, Nasal     Status: None   Collection Time: 08/19/2022  3:49 PM   Specimen: Nasal Mucosa; Nasal Swab  Result Value Ref Range Status   MRSA by PCR Next Gen NOT DETECTED NOT DETECTED Final    Comment: (NOTE) The GeneXpert MRSA Assay (FDA approved for NASAL specimens only), is one component of a comprehensive MRSA colonization surveillance program. It is not intended to diagnose MRSA infection nor to guide or monitor treatment for MRSA infections. Test performance is not FDA approved in patients less than 28 years old. Performed at [facility], [place] [address] [place], [place] [address]   Culture, blood (Routine x 2)     Status: None (Preliminary result)   Collection Time: 08/29/2022  5:07 PM   Specimen: BLOOD  Result Value Ref Range Status   Specimen Description BLOOD SITE NOT SPECIFIED  Final   Special Requests   Final    BOTTLES DRAWN AEROBIC AND ANAEROBIC Blood Culture results may not be optimal due to an excessive volume of  blood received in culture bottles   Culture   Final    NO GROWTH 4 DAYS Performed at [facility], [place] [address], [place], [place] [address]    Report Status PENDING  Incomplete  Culture, blood (Routine x 2)     Status: None (Preliminary result)   Collection Time: 09/12/2022  5:56 PM   Specimen: BLOOD RIGHT HAND  Result Value Ref Range Status   Specimen Description BLOOD RIGHT HAND  Final   Special Requests   Final    BOTTLES DRAWN AEROBIC ONLY Blood Culture  adequate volume   Culture  Setup Time PENDING  Incomplete   Culture   Final    NO GROWTH 4 DAYS Performed at [facility], [place] [address], [place], [place] [address]    Report Status PENDING  Incomplete         Radiology Studies: CT HEAD W & WO CONTRAST (5MM)  Result Date: 08/19/2022 CLINICAL DATA:  Initial evaluation for diplopia. EXAM: CT HEAD WITHOUT AND WITH CONTRAST TECHNIQUE: Contiguous axial images were obtained from the base of the skull through the vertex without and with intravenous contrast. RADIATION DOSE REDUCTION: This exam was performed according to the departmental dose-optimization program which includes automated exposure control, adjustment of the mA and/or kV according to patient size and/or use of iterative reconstruction technique. CONTRAST:  39m OMNIPAQUE IOHEXOL 350 MG/ML SOLN COMPARISON:  Prior CT from 08/31/2022. FINDINGS: Brain: Postoperative changes from prior left-sided burr hole craniotomy for subdural evacuation. A subdural drain has been removed in the interim. Residual mixed density subdural collection overlying the left cerebral convexity is increased in size now measuring up to 1.4 cm in diameter, previously 8-9 mm (series 5, image 25). Small volume extension along the falx. The hypodense left parafalcine component is decreased in size now measuring 4 mm, previously 6 mm. Associated mass effect with left-to-right shift measuring up to 8 mm, stable. Small volume subarachnoid hemorrhage at  the right parietal region again seen, slightly decreased in conspicuity from prior. Small volume intraventricular hemorrhage has also slightly decreased. Ventricles remain decompressed without hydrocephalus or trapping. Basilar cisterns remain patent. Previously seen subarachnoid hemorrhage at the basilar cisterns has largely resolved. No evidence for acute cortically based infarct. No new intracranial hemorrhage. No pathologic enhancement seen following contrast administration. Vascular: Prior to contrast administration, no abnormal hyperdense vessel. Following contrast administration, normal intravascular enhancement is seen throughout the major intracranial vascular structures. Skull: Left burr hole craniotomy with overlying scalp swelling. Skin staples remain in place. Chronic left mastoidectomy and cranioplasty. Sinuses/Orbits: Globes orbital soft tissues demonstrate no acute finding. Worsened opacification of the sphenoid ethmoidal sinuses since previous. Chronic bilateral maxillary sinus retention cyst noted. Chronic effusion involving the remaining left mastoid air cells is unchanged. Other: None. IMPRESSION: 1. Postoperative changes from prior left-sided burr hole craniotomy for subdural evacuation. A subdural drain has been removed in the interim. Residual mixed density subdural collection overlying the left cerebral convexity is increased in size, now measuring up to 1.4 cm in diameter, previously 8-9 mm. Stable 8 mm of left-to-right shift. 2. Interval decrease in small volume subarachnoid and intraventricular hemorrhage. No hydrocephalus or trapping. 3. No other new acute intracranial abnormality. No pathologic enhancement. Electronically Signed   By: BJeannine BogaM.D.   On: 09/13/2022 04:34        Scheduled Meds:  atorvastatin  20 mg Oral Daily   cefdinir  300 mg Oral Q12H   Chlorhexidine Gluconate Cloth  6 each Topical Daily   dexamethasone (DECADRON) injection  10 mg Intravenous Q6H    docusate sodium  100 mg Oral BID   lisinopril  10 mg Oral BID   pantoprazole  40 mg Oral BID   polyethylene glycol  17 g Oral Daily   Continuous Infusions:  sodium chloride 75 mL/hr at 09/11/2022 0940   levETIRAcetam 500 mg (09/14/2022 0942)     LOS: 4 days    Time spent: 45 minutes spent on chart review, discussion with nursing staff, consultants, updating family and interview/physical exam; more than 50% of  that time was spent in counseling and/or coordination of care.    Geradine Girt, DO [facility] Available via Epic secure chat 7am-7pm After these hours, please refer to coverage provider listed on [email or web address] 09/12/2022, 11:03 AM

## 2022-09-03 NOTE — Anesthesia Procedure Notes (Signed)
Procedure Name: Intubation Date/Time: 08/17/2022 3:36 PM  Performed by: Kyung Rudd, CRNAPre-anesthesia Checklist: Patient identified, Emergency Drugs available, Suction available and Patient being monitored Patient Re-evaluated:Patient Re-evaluated prior to induction Oxygen Delivery Method: Circle system utilized Preoxygenation: Pre-oxygenation with 100% oxygen Induction Type: IV induction Ventilation: Mask ventilation without difficulty Laryngoscope Size: Mac and 3 Grade View: Grade I Tube type: Oral Tube size: 7.0 mm Number of attempts: 1 Airway Equipment and Method: Stylet Placement Confirmation: ETT inserted through vocal cords under direct vision, positive ETCO2 and breath sounds checked- equal and bilateral Secured at: 21 cm Tube secured with: Tape Dental Injury: Teeth and Oropharynx as per pre-operative assessment

## 2022-09-03 NOTE — Progress Notes (Signed)
Patient noticed to have acute change from behavior yesterday. Patient transferred from ICU to [facility] 09/02/2022 in the AM, patient was A&O x 4, able to complete ambulation from chair to bed, clearly communicate, and make phone calls. In the afternoon patient began to complain of visual changes stating vision was sometimes grey and sometimes double, patient also felt tired and dizzy. Attending and neurosurgery team were notified. New orders placed.  Orthostatic vitals were obtained, and positive. Patient was given fluids.  On morning 09/02/2022, per overnight nurse patient was agitated overnight, pulling out IVs and removing equipment. When this nursed entered room at [facility] for assessment, Dr. Saintclair Halsted was at bedside and patient was only responsive to voice, but not following commands.   At 12pm 09/14/2022 patient started to desat while on room air, and was placed on 3L [place]. At 2pm, this nurse was prompted by attending to recheck oxygen saturation and to examine ability to protect airway. Patient O2 saturation at 2pm was 93%. Patient was also found to be only responsive to painful stimuli during this assessment, patient pupil size had increased from 81m to 528m were round with brisk reaction. Changes to neuro status were called in to Dr. CrSaintclair HalstedAttending also made aware, and consulted Dr. ChTacy LearnICU).   Patient was transferred by this nurse to OR. Patient showed no signs of distress during transport.

## 2022-09-03 NOTE — Progress Notes (Signed)
Inpatient Rehab Admissions Coordinator:  Await medical clearance and bed availability. Note pt may return to OR. Will continue to follow.   Gayland Curry, [place], [place] Admissions Coordinator [phone]

## 2022-09-03 NOTE — Anesthesia Preprocedure Evaluation (Signed)
Anesthesia Evaluation  Patient identified by MRN, date of birth, ID band Patient unresponsive    Reviewed: Allergy & Precautions, Unable to perform ROS - Chart review onlyPreop documentation limited or incomplete due to emergent nature of procedure.  Airway Mallampati: II  TM Distance: >3 FB Neck ROM: Full    Dental no notable dental hx.    Pulmonary    Pulmonary exam normal breath sounds clear to auscultation       Cardiovascular hypertension, Pt. on medications Normal cardiovascular exam Rhythm:Regular Rate:Normal  1. Left ventricular ejection fraction, by visual estimation, is 60 to  65%. The left ventricle has normal function. There is no left ventricular  hypertrophy.  2. The left ventricle has no regional wall motion abnormalities.  3. Normal GLS -22.  4. Global right ventricle has normal systolic function.The right  ventricular size is normal. No increase in right ventricular wall  thickness.  5. Left atrial size was normal.  6. Right atrial size was normal.  7. The mitral valve is normal in structure. Trivial mitral valve  regurgitation. No evidence of mitral stenosis.  8. The tricuspid valve is normal in structure.  9. The tricuspid valve is normal in structure. Tricuspid valve  regurgitation is not demonstrated.  10. The aortic valve is tricuspid. Aortic valve regurgitation is mild.  Mild aortic valve sclerosis without stenosis.  11. The pulmonic valve was normal in structure. Pulmonic valve  regurgitation is trivial.  12. Aortic dilatation noted.  13. There is mild dilatation of the aortic root measuring 41 mm.  14. Mildly elevated pulmonary artery systolic pressure.  15. The inferior vena cava is normal in size with greater than 50%  respiratory variability, suggesting right atrial pressure of 3 mmHg.    Neuro/Psych PSYCHIATRIC DISORDERS Anxiety Depression  Neuromuscular disease    GI/Hepatic Neg  liver ROS, GERD  ,  Endo/Other  diabetes  Renal/GU negative Renal ROSLab Results      Component                Value               Date                      CREATININE               0.60                08/16/2022                Musculoskeletal  (+) Arthritis ,   Abdominal   Peds  Hematology  (+) Blood dyscrasia, anemia , Lab Results      Component                Value               Date                      WBC                      10.2                09/07/2022                HGB                      10.9 (L)  09/02/2022                HCT                      32.0 (L)            08/31/2022                MCV                      79.6 (L)            08/20/2022                PLT                      128 (L)             08/20/2022              Anesthesia Other Findings Left Subdural with shift  Reproductive/Obstetrics                             Anesthesia Physical  Anesthesia Plan  ASA: 4 and emergent  Anesthesia Plan: General   Post-op Pain Management:    Induction: Intravenous  PONV Risk Score and Plan: 3 and Ondansetron, Treatment may vary due to age or medical condition, Dexamethasone and Midazolam  Airway Management Planned: Oral ETT  Additional Equipment:   Intra-op Plan:   Post-operative Plan: Extubation in OR and Possible Post-op intubation/ventilation  Informed Consent:     History available from chart only and Only emergency history available  Plan Discussed with: CRNA and Surgeon  Anesthesia Plan Comments:         Anesthesia Quick Evaluation

## 2022-09-03 NOTE — Transfer of Care (Signed)
Immediate Anesthesia Transfer of Care Note  Patient: Kristin Delgado  Procedure(s) Performed: REPEAT BURR HOLE FOR SUBDURAL HEMATOMA (Left: Head)  Patient Location: NICU  Anesthesia Type:General  Level of Consciousness: sedated, unresponsive, and Patient remains intubated per anesthesia plan  Airway & Oxygen Therapy: Patient remains intubated per anesthesia plan and Patient placed on Ventilator (see vital sign flow sheet for setting)  Post-op Assessment: Report given to RN and Post -op Vital signs reviewed and stable  Post vital signs: Reviewed and stable  Last Vitals:  Vitals Value Taken Time  BP 161/88 09/13/2022 1700  Temp    Pulse 86 09/07/2022 1659  Resp 15 09/10/2022 1705  SpO2 92 % 08/25/2022 1659  Vitals shown include unvalidated device data.  Last Pain:  Vitals:   09/11/2022 1517  TempSrc: Oral  PainSc:       Patients Stated Pain Goal: 0 (82/50/03 7048)  Complications: No notable events documented.

## 2022-09-03 NOTE — Progress Notes (Addendum)
Subjective: Patient reports patient is somnolent but arousable   Objective: Vital signs in last 24 hours: Temp:  [97 F (36.1 C)-100.2 F (37.9 C)] 97 F (36.1 C) (10/19 0728) Pulse Rate:  [79-93] 87 (10/19 0728) Resp:  [17-20] 19 (10/19 0728) BP: (128-160)/(69-94) 156/88 (10/19 0728) SpO2:  [85 %-100 %] 95 % (10/19 0728) Weight:  [73.4 kg] 73.4 kg (10/19 0500)  Intake/Output from previous day: No intake/output data recorded. Intake/Output this shift: No intake/output data recorded.  Somnolent but arousable opens eyes and moves all extremities but does not follow commands  Lab Results: Recent Labs    09/02/22 0411 08/16/2022 0457  WBC 11.7* 12.5*  HGB 9.0* 9.9*  HCT 27.8* 31.8*  PLT 77* 97*   BMET Recent Labs    09/02/22 0411 08/25/2022 0457  NA 142 138  K 3.7 3.5  CL 103 99  CO2 26 24  GLUCOSE 113* 119*  BUN 15 14  CREATININE 0.41* 0.55  CALCIUM 9.9 9.4    Studies/Results: CT HEAD W & WO CONTRAST (5MM)  Result Date: 08/23/2022 CLINICAL DATA:  Initial evaluation for diplopia. EXAM: CT HEAD WITHOUT AND WITH CONTRAST TECHNIQUE: Contiguous axial images were obtained from the base of the skull through the vertex without and with intravenous contrast. RADIATION DOSE REDUCTION: This exam was performed according to the departmental dose-optimization program which includes automated exposure control, adjustment of the mA and/or kV according to patient size and/or use of iterative reconstruction technique. CONTRAST:  63m OMNIPAQUE IOHEXOL 350 MG/ML SOLN COMPARISON:  Prior CT from 08/31/2022. FINDINGS: Brain: Postoperative changes from prior left-sided burr hole craniotomy for subdural evacuation. A subdural drain has been removed in the interim. Residual mixed density subdural collection overlying the left cerebral convexity is increased in size now measuring up to 1.4 cm in diameter, previously 8-9 mm (series 5, image 25). Small volume extension along the falx. The hypodense  left parafalcine component is decreased in size now measuring 4 mm, previously 6 mm. Associated mass effect with left-to-right shift measuring up to 8 mm, stable. Small volume subarachnoid hemorrhage at the right parietal region again seen, slightly decreased in conspicuity from prior. Small volume intraventricular hemorrhage has also slightly decreased. Ventricles remain decompressed without hydrocephalus or trapping. Basilar cisterns remain patent. Previously seen subarachnoid hemorrhage at the basilar cisterns has largely resolved. No evidence for acute cortically based infarct. No new intracranial hemorrhage. No pathologic enhancement seen following contrast administration. Vascular: Prior to contrast administration, no abnormal hyperdense vessel. Following contrast administration, normal intravascular enhancement is seen throughout the major intracranial vascular structures. Skull: Left burr hole craniotomy with overlying scalp swelling. Skin staples remain in place. Chronic left mastoidectomy and cranioplasty. Sinuses/Orbits: Globes orbital soft tissues demonstrate no acute finding. Worsened opacification of the sphenoid ethmoidal sinuses since previous. Chronic bilateral maxillary sinus retention cyst noted. Chronic effusion involving the remaining left mastoid air cells is unchanged. Other: None. IMPRESSION: 1. Postoperative changes from prior left-sided burr hole craniotomy for subdural evacuation. A subdural drain has been removed in the interim. Residual mixed density subdural collection overlying the left cerebral convexity is increased in size, now measuring up to 1.4 cm in diameter, previously 8-9 mm. Stable 8 mm of left-to-right shift. 2. Interval decrease in small volume subarachnoid and intraventricular hemorrhage. No hydrocephalus or trapping. 3. No other new acute intracranial abnormality. No pathologic enhancement. Electronically Signed   By: BJeannine BogaM.D.   On: 08/26/2022 04:34     Assessment/Plan: Status post bur hole craniectomy for  evacuation of subdural patient was doing well however increased somnolence and patient will not follow commands.  CT scan does show reaccumulation of a centimeter of chronic subdural fluid could be CSF and external hydrocephalus.  But unclear we will start the patient on steroids we will transfer the patient to a stepdown unit where we can have closer observation.  Consider redo bur hole versus embolization.  We will monitor keep the patient n.p.o. also possibility this could be some subclinical seizure activity we will start on Keppra just in case.  LOS: 4 days     Elaina Hoops 09/13/2022, 8:21 AM

## 2022-09-03 NOTE — Progress Notes (Signed)
Pt in procedure unable to obtain EEG will try back as schedule permits

## 2022-09-03 NOTE — Progress Notes (Signed)
OT Cancellation Note  Patient Details Name: Kristin Delgado MRN: [id number] DOB: 04-Apr-1956   Cancelled Treatment:    Reason Eval/Treat Not Completed: Medical issues which prohibited therapy- per RN, plan to transfer due to not following commands, per neurosurgery note possible redo bur hole vs embolization.  Will follow and see as appropriate.    Jolaine Artist, OT Acute Rehabilitation Services Office [phone]   Kristin Delgado 09/15/2022, 9:11 AM

## 2022-09-03 NOTE — Progress Notes (Signed)
Patient ID: Kristin Delgado, female   DOB: 07-Feb-1956, 66 y.o.   MRN: [id number] Patient with progressive neurologic decline on the floor became less responsive would not open her eyes to voice only to deep stimulation.  She still moves all extremities well.  Pupils are dilated but reactive 5-3 moves all extremities symmetrically  Due to the patient's decline CT scan showing expansion of subdural fluid I have extensively talked to the patient's husband and recommended reexploration and redo bur hole craniectomy for evacuation of left-sided chronic subdural.  I have extensively gone over the risks and benefits

## 2022-09-03 NOTE — Op Note (Signed)
Preoperative diagnosis: Recurrent left chronic subdural hematoma.  Postoperative diagnosis: Same.  Procedure: Redo bur hole craniectomy for evacuation of subdural hematoma.  Surgeon: Dominica Severin Tynell Winchell  Esthesia: General  EBL: Minimal  HPI: 66 year old female who presented over the weekend with a large chronic subdural hematoma patient was taken emergently to the OR for bur hole craniectomy for evacuation of hematoma she did really well the initial few days over the last 24 hours has had a slight decline in cognition follow-up CT scan did show a recurrence patient continues declined and did not respond to steroids and/or antiepileptics so Tat[facility] patient was taken urgently to the OR for redo bur hole craniectomy for evacuation of subdural..  Operative procedure patient was brought into the OR was [facility] general anesthesia positioned supine with a bump on the left shoulder head turned the right role bur hole sites staples removed head was prepped and draped in routine sterile fashion her old incisions were opened up and has remove the Gelfoam cover the bur hole immediate subdural fluid came out under pressure this was straw-colored look like hygroma to [name] mix of CSF and blood and when I remove the Gelfoam from the posterior hole also got some old blood and clot out of that hole in the brain reexpanded basically filled out the cavity.  Underneath the frontal bur hole there was a large vein on the cortical surface and this was actively bleeding this was coagulated and placed a small piece of Gelfoam overlying the spot and copiously irrigated the 2 bur holes communicating with a red rubber catheter but the brain completely filled up the space in the posterior bur hole and virtually filled up this space from the frontal bur hole so I elected not to leave a drain as there was minimal residual space.  At this point Gelfoam was overlaid top of the bur holes meticulous hemostasis was maintained in both incisions were  closed with interrupted Vicryl and staples.  Patient was remitted to recovery in stable condition.  At the end the case all needle count sponge counts were correct.

## 2022-09-03 NOTE — Progress Notes (Signed)
PT Cancellation Note  Patient Details Name: Kristin Delgado MRN: [id number] DOB: 16-Feb-1956   Cancelled Treatment:    Reason Eval/Treat Not Completed: Medical issues which prohibited therapy. Pt currently not following commands and per chart review may transfer/return to OR. Will continue with PT POC when medically ready to participate.    Kristin Delgado 08/21/2022, 11:49 AM  Kristin Delgado, PT, DPT Acute Rehabilitation Services Secure Chat Preferred Office: [phone]

## 2022-09-04 ENCOUNTER — Encounter (HOSPITAL_COMMUNITY): Admission: EM | Disposition: E | Payer: Self-pay | Source: Home / Self Care | Attending: Pulmonary Disease

## 2022-09-04 ENCOUNTER — Encounter (HOSPITAL_COMMUNITY): Payer: Self-pay | Admitting: Neurosurgery

## 2022-09-04 ENCOUNTER — Inpatient Hospital Stay (HOSPITAL_COMMUNITY): Payer: Medicare Other

## 2022-09-04 ENCOUNTER — Inpatient Hospital Stay (HOSPITAL_COMMUNITY): Payer: 59

## 2022-09-04 DIAGNOSIS — S065XAA Traumatic subdural hemorrhage with loss of consciousness status unknown, initial encounter: Secondary | ICD-10-CM | POA: Diagnosis not present

## 2022-09-04 LAB — BASIC METABOLIC PANEL
Anion gap: 13 (ref 5–15)
BUN: 22 mg/dL (ref 8–23)
CO2: 21 mmol/L — ABNORMAL LOW (ref 22–32)
Calcium: 8.5 mg/dL — ABNORMAL LOW (ref 8.9–10.3)
Chloride: 106 mmol/L (ref 98–111)
Creatinine, Ser: 0.7 mg/dL (ref 0.44–1.00)
GFR, Estimated: >60 mL/min (ref >60)
Glucose, Bld: 131 mg/dL — ABNORMAL HIGH (ref 70–99)
Potassium: 4 mmol/L (ref 3.5–5.1)
Sodium: 140 mmol/L (ref 135–145)

## 2022-09-04 LAB — CBC
HCT: 24.1 % — ABNORMAL LOW (ref 36.0–46.0)
Hemoglobin: 7.8 g/dL — ABNORMAL LOW (ref 12.0–15.0)
MCH: 25.5 pg — ABNORMAL LOW (ref 26.0–34.0)
MCHC: 32.4 g/dL (ref 30.0–36.0)
MCV: 78.8 fL — ABNORMAL LOW (ref 80.0–100.0)
Platelets: 90 10*3/uL — ABNORMAL LOW (ref 150–400)
RBC: 3.06 MIL/uL — ABNORMAL LOW (ref 3.87–5.11)
RDW: 14.2 % (ref 11.5–15.5)
WBC: 12.9 10*3/uL — ABNORMAL HIGH (ref 4.0–10.5)
nRBC: 0.7 % — ABNORMAL HIGH (ref 0.0–0.2)

## 2022-09-04 LAB — GLUCOSE, CAPILLARY
Glucose-Capillary: 117 mg/dL — ABNORMAL HIGH (ref 70–99)
Glucose-Capillary: 153 mg/dL — ABNORMAL HIGH (ref 70–99)
Glucose-Capillary: 192 mg/dL — ABNORMAL HIGH (ref 70–99)

## 2022-09-04 LAB — TRIGLYCERIDES: Triglycerides: 379 mg/dL — ABNORMAL HIGH (ref <150)

## 2022-09-04 LAB — CULTURE, BLOOD (ROUTINE X 2): Culture: NO GROWTH

## 2022-09-04 LAB — PHOSPHORUS: Phosphorus: 4.2 mg/dL (ref 2.5–4.6)

## 2022-09-04 LAB — MAGNESIUM: Magnesium: 2 mg/dL (ref 1.7–2.4)

## 2022-09-04 SURGERY — IR WITH ANESTHESIA
Anesthesia: General

## 2022-09-04 MED ORDER — VITAL AF 1.2 CAL PO LIQD
1000.0000 mL | ORAL | Status: DC
  Administered 2022-09-04 – 2022-09-05 (×2): 1000 mL

## 2022-09-04 MED ORDER — ACETAMINOPHEN 325 MG PO TABS: 650.0000 mg | ORAL_TABLET | ORAL | Status: DC | PRN

## 2022-09-04 MED ORDER — PROSOURCE TF20 ENFIT COMPATIBL EN LIQD: 60.0000 mL | Freq: Every day | ENTERAL | Status: DC

## 2022-09-04 MED ORDER — ACETAMINOPHEN 325 MG PO TABS
650.0000 mg | ORAL_TABLET | ORAL | Status: DC | PRN
  Administered 2022-09-04: 650 mg
  Filled 2022-09-04: qty 2

## 2022-09-04 NOTE — Progress Notes (Signed)
Initial Nutrition Assessment  DOCUMENTATION CODES:   Not applicable  INTERVENTION:   Initiate tube feeding via Cortrak when placement verified by x-ray: Vital AF 1.2 at 65 ml/h (1560 ml per day)  Provides 1872 kcal, 117 gm protein, 1265 ml free water daily.  NUTRITION DIAGNOSIS:   Inadequate oral intake related to inability to eat as evidenced by NPO status.  GOAL:   Patient will meet greater than or equal to 90% of their needs  MONITOR:   Vent status, Labs, TF tolerance  REASON FOR ASSESSMENT:   Ventilator    ASSESSMENT:   66 yo female admitted with large chronic SDH. S/P emergent bur hole craniectomy on 10/15. PMH includes HTN, HLD, pituitary microadenoma, GERD, vestibular schwannoma, vitamin D deficiency, swallowing difficulty.  Patient was doing well S/P craniectomy 10/15, extubated 10/16, and transferred to the floor 10/18. Developed change in mental status with re-accumulation of SAH, requiring redo bur hole craniectomy on 10/19. CT today showed new stroke. Noted very grave prognosis.   Discussed nutrition plan with RN and CCM, okay to begin TF. Received MD Consult for TF initiation and management. Cortrak tube placed this morning. Awaiting x-ray verification of placement.  Patient is currently intubated on ventilator support MV: 9.3 L/min Temp (24hrs), Avg:98.9 F (37.2 C), Min:98.2 F (36.8 C), Max:99.8 F (37.7 C)  Propofol: 11.1 ml/hr providing 293 kcal from lipid. Cleviprex: 2 ml/h providing 96 kcal from lipid.  Labs reviewed. Trig 379  Medications reviewed and include Decadron, Colace, Protonix, Miralax, Cleviprex, propofol.   Weight history reviewed. Patient with 4% weight loss within the past 4 months PTA, insignificant for the time frame.  NUTRITION - FOCUSED PHYSICAL EXAM:  Unable to complete  Diet Order:   Diet Order             Diet NPO time specified  Diet effective now                   EDUCATION NEEDS:   No education needs  have been identified at this time  Skin:  Skin Assessment: Reviewed RN Assessment  Last BM:  10/17  Height:   Ht Readings from Last 1 Encounters:  09/14/2022 '5\' 3"'$  (1.6 m)    Weight:   Wt Readings from Last 1 Encounters:  09/04/22 77.7 kg    Ideal Body Weight:  52.3 kg  BMI:  Body mass index is 30.34 kg/m.  Estimated Nutritional Needs:   Kcal:  4696-2952  Protein:  100-115 gm  Fluid:  1.8-2 L   Lucas Mallow RD, LDN, CNSC Please refer to Amion for contact information.

## 2022-09-04 NOTE — Progress Notes (Signed)
Patient ID: Kristin Delgado, female   DOB: [birth year], 66 y.o.   MRN: [id number] I had an extensive discussion with patient's family her husband's sister husband and daughter one of his other family members on speaker phone and his brother.  I extensively explained the patient's current neurologic status results of the CT scan poor prognosis.  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  They did request a DNR order to be put on the chart however we will continue to support her over the next 24 to 48 hours depending on how she responds may discuss withdrawal support at that point.  However we will continue full support at this point but if her heart were to stop we will not restart nor initiate aggressive pressor management.

## 2022-09-04 NOTE — Progress Notes (Signed)
Subjective: Patient reports  remains intubated  Objective: Vital signs in last 24 hours: Temp:  [98.2 F (36.8 C)-99.8 F (37.7 C)] 98.9 F (37.2 C) (10/20 0800) Pulse Rate:  [70-98] 78 (10/20 0800) Resp:  [15-30] 22 (10/20 0800) BP: (112-161)/(73-97) 149/97 (10/20 0800) SpO2:  [87 %-100 %] 93 % (10/20 0800) FiO2 (%):  [40 %-100 %] 40 % (10/20 0733) Weight:  [73.4 kg-77.7 kg] 77.7 kg (10/20 0500)  Intake/Output from previous day: 10/19 0701 - 10/20 0700 In: 2828 [I.V.:2178; IV Piggyback:650] Out: 660 [Urine:825; Blood:50] Intake/Output this shift: Total I/O In: -  Out: 550 [Urine:550]  Intubated and sedated left pupil fixed and nonreactive at 6 right pupil reactive right hemiparesis left with some withdrawal upper and lower extremity incisions clean dry and intact  Lab Results: Recent Labs    09/10/2022 1720 08/31/2022 1807 09/04/22 0511  WBC 12.4*  --  12.9*  HGB 8.8* 8.2* 7.8*  HCT 27.8* 24.0* 24.1*  PLT 92*  --  90*   BMET Recent Labs    09/09/2022 1720 09/10/2022 1807 09/04/22 0511  NA 140 137 140  K 3.7 3.7 4.0  CL 103  --  106  CO2 26  --  21*  GLUCOSE 125*  --  131*  BUN 16  --  22  CREATININE 0.66  --  0.70  CALCIUM 8.8*  --  8.5*    Studies/Results: CT HEAD WO CONTRAST (5MM)  Result Date: 09/04/2022 CLINICAL DATA:  Follow-up examination for subdural hematoma, status post interval redo burr hole craniotomy for subdural evacuation. EXAM: CT HEAD WITHOUT CONTRAST TECHNIQUE: Contiguous axial images were obtained from the base of the skull through the vertex without intravenous contrast. RADIATION DOSE REDUCTION: This exam was performed according to the departmental dose-optimization program which includes automated exposure control, adjustment of the mA and/or kV according to patient size and/or use of iterative reconstruction technique. COMPARISON:  Comparison made with prior CT from 09/14/2022. FINDINGS: Brain: Postoperative changes from prior left-sided burr  hole craniotomy for subdural evacuation. The mixed density extra-axial subdural collection overlying the left cerebral convexity is decreased in size from prior, now measuring up to 6-7 mm in maximal diameter at the anterior left frontal lobe, previously measuring up to 1.4 cm in diameter. Few scattered foci of pneumocephalus are seen, consistent with interval craniotomy. Small parafalcine component measures up to 4 mm. Similar mass effect with up to 9 mm of left-to-right shift. Scattered small volume subarachnoid and intraventricular hemorrhage again seen, similar to prior. No hydrocephalus or trapping. Approximately 4.3 x 4.4 x 5.1 cm (estimated volume 48 mL). Surrounding vasogenic edema within the adjacent left frontal lobe. This is new from prior. No other new intracranial hemorrhage elsewhere. No other acute large vessel territory infarct. Note made of somewhat linear hypodensity traversing the right parietal region, which appears to correspond with streak artifact on axial image (series 5, image 56). Vascular: No visible abnormal hyperdense vessel. Skull: Prior burr hole craniotomy on the left. Overlying soft tissue swelling with skin staples in place. Sinuses/Orbits: Globes and orbital soft tissues demonstrate no acute finding. Chronic paranasal sinus disease noted. Remote left mastoidectomy and cranioplasty noted as well. Other: None. IMPRESSION: 1. Postoperative changes from interval redo left burr hole craniotomy for subdural evacuation. The mixed density extra-axial subdural collection is decreased in size, now measuring up to 6-7 mm in maximal diameter, previously up to 1.4 cm. Persistent 9 mm of left-to-right shift, similar to prior. 2. New 4.3 x 4.4 x 5.1  cm intraparenchymal hematoma with surrounding edema centered at the left frontal operculum (estimated volume 48 mL). 3. Similar scattered small volume subarachnoid and intraventricular hemorrhage. No hydrocephalus or trapping. Electronically Signed    By: Jeannine Boga M.D.   On: 09/04/2022 03:46   DG CHEST PORT 1 VIEW  Result Date: 08/21/2022 CLINICAL DATA:  Endotracheal tube position EXAM: PORTABLE CHEST 1 VIEW COMPARISON:  Chest 08/31/2022 FINDINGS: Endotracheal tube in good position approximately 4 cm above the carina. NG tube has been removed. Improved aeration in the left lung base. Mild residual bibasilar atelectasis. Mild vascular congestion without edema or effusion IMPRESSION: Endotracheal tube in good position. Mild bibasilar atelectasis with improvement of aeration in the left lung base compared with prior study Mild vascular congestion. Electronically Signed   By: Franchot Gallo M.D.   On: 08/26/2022 18:51   CT HEAD W & WO CONTRAST (5MM)  Result Date: 08/16/2022 CLINICAL DATA:  Initial evaluation for diplopia. EXAM: CT HEAD WITHOUT AND WITH CONTRAST TECHNIQUE: Contiguous axial images were obtained from the base of the skull through the vertex without and with intravenous contrast. RADIATION DOSE REDUCTION: This exam was performed according to the departmental dose-optimization program which includes automated exposure control, adjustment of the mA and/or kV according to patient size and/or use of iterative reconstruction technique. CONTRAST:  4m OMNIPAQUE IOHEXOL 350 MG/ML SOLN COMPARISON:  Prior CT from 08/31/2022. FINDINGS: Brain: Postoperative changes from prior left-sided burr hole craniotomy for subdural evacuation. A subdural drain has been removed in the interim. Residual mixed density subdural collection overlying the left cerebral convexity is increased in size now measuring up to 1.4 cm in diameter, previously 8-9 mm (series 5, image 25). Small volume extension along the falx. The hypodense left parafalcine component is decreased in size now measuring 4 mm, previously 6 mm. Associated mass effect with left-to-right shift measuring up to 8 mm, stable. Small volume subarachnoid hemorrhage at the right parietal region again  seen, slightly decreased in conspicuity from prior. Small volume intraventricular hemorrhage has also slightly decreased. Ventricles remain decompressed without hydrocephalus or trapping. Basilar cisterns remain patent. Previously seen subarachnoid hemorrhage at the basilar cisterns has largely resolved. No evidence for acute cortically based infarct. No new intracranial hemorrhage. No pathologic enhancement seen following contrast administration. Vascular: Prior to contrast administration, no abnormal hyperdense vessel. Following contrast administration, normal intravascular enhancement is seen throughout the major intracranial vascular structures. Skull: Left burr hole craniotomy with overlying scalp swelling. Skin staples remain in place. Chronic left mastoidectomy and cranioplasty. Sinuses/Orbits: Globes orbital soft tissues demonstrate no acute finding. Worsened opacification of the sphenoid ethmoidal sinuses since previous. Chronic bilateral maxillary sinus retention cyst noted. Chronic effusion involving the remaining left mastoid air cells is unchanged. Other: None. IMPRESSION: 1. Postoperative changes from prior left-sided burr hole craniotomy for subdural evacuation. A subdural drain has been removed in the interim. Residual mixed density subdural collection overlying the left cerebral convexity is increased in size, now measuring up to 1.4 cm in diameter, previously 8-9 mm. Stable 8 mm of left-to-right shift. 2. Interval decrease in small volume subarachnoid and intraventricular hemorrhage. No hydrocephalus or trapping. 3. No other new acute intracranial abnormality. No pathologic enhancement. Electronically Signed   By: BJeannine BogaM.D.   On: 09/01/2022 04:34    Assessment/Plan: Postop day 1 redo bur hole craniectomy follow-up CT scan this morning shows subdurals evacuated however looks like a large left frontal venous infarction mass effect and midline shift is stable however patient's exam  clearly is declined.  I did contact the husband and explained to him that she had had a form of a stroke and it was not something that we could help with any additional surgery and that we would continue to support her that she was actively getting an EEG right now but that this was an important part of a dominant hemisphere of her brain controlling speech and understanding he understands and says family is coming and we will try to find them and talk to them later when they arrive.  Prognosis very grave  LOS: 5 days     Elaina Hoops 09/04/2022, 10:29 AM

## 2022-09-04 NOTE — Anesthesia Postprocedure Evaluation (Signed)
Anesthesia Post Note  Patient: Kristin Delgado  Procedure(s) Performed: REPEAT BURR HOLE FOR SUBDURAL HEMATOMA (Left: Head)     Patient location during evaluation: SICU Anesthesia Type: General Level of consciousness: sedated Pain management: pain level controlled Vital Signs Assessment: post-procedure vital signs reviewed and stable Respiratory status: patient remains intubated per anesthesia plan Cardiovascular status: stable Postop Assessment: no apparent nausea or vomiting Anesthetic complications: no   No notable events documented.  Last Vitals:  Vitals:   09/04/22 1945 09/04/22 2000  BP:  114/73  Pulse: 69 74  Resp: 20 (!) 23  Temp:  37 C  SpO2: 99% 98%    Last Pain:  Vitals:   09/04/22 2000  TempSrc: Oral  PainSc:                  Tiajuana Amass

## 2022-09-04 NOTE — Progress Notes (Signed)
PT Cancellation Note  Patient Details Name: Kristin Delgado MRN: [id number] DOB: 07/05/1956   Cancelled Treatment:    Reason Eval/Treat Not Completed: Patient not medically ready. RN requesting hold on therapy at this time due to pt's decline in neuro status. Will plan to follow-up another day as able.   Moishe Spice, PT, DPT Acute Rehabilitation Services  Office: [place] 09/04/2022, 1:10 PM

## 2022-09-04 NOTE — Progress Notes (Addendum)
LTM EEG hooked up and running - no initial skin breakdown - push button tested - neuro notified. [facility] monitoring. F3, C3, AND P3 are off due to stitches

## 2022-09-04 NOTE — Progress Notes (Signed)
Not able to perform EEG at this moment. Pt has surgical bandages on left of head, and nurse said those areas are fresh and draining, will wait to be advised.

## 2022-09-04 NOTE — Procedures (Signed)
Cortrak  Person Inserting Tube:  Ranell Patrick D, RD Tube Type:  Cortrak - 43 inches Tube Size:  10 Tube Location:  Left nare Secured by: Bridle Technique Used to Measure Tube Placement:  Marking at nare/corner of mouth Cortrak Secured At:  60 cm   Cortrak Tube Team Note:  Consult received to place a Cortrak feeding tube.   X-ray is required, abdominal x-ray has been ordered by the Cortrak team. Please confirm tube placement before using the Cortrak tube.   If the tube becomes dislodged please keep the tube and contact the Cortrak team at [email or web address] for replacement.  If after hours and replacement cannot be delayed, place a NG tube and confirm placement with an abdominal x-ray.    Ranell Patrick, RD, LDN Clinical Dietitian RD pager # available in [place]  After hours/weekend pager # available in [facility]

## 2022-09-04 NOTE — Progress Notes (Signed)
Inpatient Rehab Admissions Coordinator:  Note pt intubated and sedated. Will follow medical work up and progress with therapies from a distance.   Gayland Curry, [place], [place] Admissions Coordinator [phone]
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
## 2022-09-05 DIAGNOSIS — R569 Unspecified convulsions: Secondary | ICD-10-CM

## 2022-09-05 DIAGNOSIS — S065XAA Traumatic subdural hemorrhage with loss of consciousness status unknown, initial encounter: Secondary | ICD-10-CM | POA: Diagnosis not present

## 2022-09-05 LAB — COMPREHENSIVE METABOLIC PANEL
ALT: 34 U/L (ref 0–44)
AST: 50 U/L — ABNORMAL HIGH (ref 15–41)
Albumin: 2.1 g/dL — ABNORMAL LOW (ref 3.5–5.0)
Alkaline Phosphatase: 134 U/L — ABNORMAL HIGH (ref 38–126)
Anion gap: 9 (ref 5–15)
BUN: 32 mg/dL — ABNORMAL HIGH (ref 8–23)
CO2: 20 mmol/L — ABNORMAL LOW (ref 22–32)
Calcium: 8.6 mg/dL — ABNORMAL LOW (ref 8.9–10.3)
Chloride: 117 mmol/L — ABNORMAL HIGH (ref 98–111)
Creatinine, Ser: 0.68 mg/dL (ref 0.44–1.00)
GFR, Estimated: >60 mL/min (ref >60)
Glucose, Bld: 176 mg/dL — ABNORMAL HIGH (ref 70–99)
Potassium: 3.6 mmol/L (ref 3.5–5.1)
Sodium: 146 mmol/L — ABNORMAL HIGH (ref 135–145)
Total Bilirubin: 0.5 mg/dL (ref 0.3–1.2)
Total Protein: 5.1 g/dL — ABNORMAL LOW (ref 6.5–8.1)

## 2022-09-05 LAB — HEMOGLOBIN A1C
Hgb A1c MFr Bld: 5.4 % (ref 4.8–5.6)
Mean Plasma Glucose: 108.28 mg/dL

## 2022-09-05 LAB — PHOSPHORUS: Phosphorus: 2.7 mg/dL (ref 2.5–4.6)

## 2022-09-05 LAB — GLUCOSE, CAPILLARY
Glucose-Capillary: 167 mg/dL — ABNORMAL HIGH (ref 70–99)
Glucose-Capillary: 170 mg/dL — ABNORMAL HIGH (ref 70–99)
Glucose-Capillary: 158 mg/dL — ABNORMAL HIGH (ref 70–99)

## 2022-09-05 LAB — CULTURE, BLOOD (ROUTINE X 2)
Culture: NO GROWTH
Special Requests: ADEQUATE

## 2022-09-05 LAB — CBC
HCT: 26.1 % — ABNORMAL LOW (ref 36.0–46.0)
Hemoglobin: 7.8 g/dL — ABNORMAL LOW (ref 12.0–15.0)
MCH: 24.3 pg — ABNORMAL LOW (ref 26.0–34.0)
MCHC: 29.9 g/dL — ABNORMAL LOW (ref 30.0–36.0)
MCV: 81.3 fL (ref 80.0–100.0)
Platelets: 87 10*3/uL — ABNORMAL LOW (ref 150–400)
RBC: 3.21 MIL/uL — ABNORMAL LOW (ref 3.87–5.11)
RDW: 14.5 % (ref 11.5–15.5)
WBC: 13.7 10*3/uL — ABNORMAL HIGH (ref 4.0–10.5)
nRBC: 1.4 % — ABNORMAL HIGH (ref 0.0–0.2)

## 2022-09-05 LAB — MAGNESIUM: Magnesium: 2.3 mg/dL (ref 1.7–2.4)

## 2022-09-05 MED ORDER — SODIUM CHLORIDE 0.9 % IV SOLN: INTRAVENOUS | Status: DC

## 2022-09-05 MED ORDER — ACETAMINOPHEN 325 MG PO TABS: 650.0000 mg | ORAL_TABLET | Freq: Four times a day (QID) | ORAL | Status: DC | PRN

## 2022-09-05 MED ORDER — MIDAZOLAM HCL 2 MG/2ML IJ SOLN: 1.0000 mg | INTRAMUSCULAR | Status: DC | PRN

## 2022-09-05 MED ORDER — INSULIN ASPART 100 UNIT/ML IJ SOLN
0.0000 [IU] | INTRAMUSCULAR | Status: DC
  Administered 2022-09-05 (×4): 3 [IU] via SUBCUTANEOUS

## 2022-09-05 MED ORDER — LISINOPRIL 10 MG PO TABS: 10.0000 mg | ORAL_TABLET | Freq: Two times a day (BID) | ORAL | Status: DC

## 2022-09-05 MED ORDER — ATORVASTATIN CALCIUM 10 MG PO TABS: 20.0000 mg | ORAL_TABLET | Freq: Every day | ORAL | Status: DC

## 2022-09-05 MED ORDER — GLYCOPYRROLATE 1 MG PO TABS: 1.0000 mg | ORAL_TABLET | ORAL | Status: DC | PRN

## 2022-09-05 MED ORDER — GLYCOPYRROLATE 0.2 MG/ML IJ SOLN
0.2000 mg | INTRAMUSCULAR | Status: DC | PRN
  Filled 2022-09-05 (×2): qty 1

## 2022-09-05 MED ORDER — MORPHINE 100MG IN NS 100ML (1MG/ML) PREMIX INFUSION
0.0000 mg/h | INTRAVENOUS | Status: DC
  Administered 2022-09-05: 5 mg/h via INTRAVENOUS
  Filled 2022-09-05: qty 100

## 2022-09-05 MED ORDER — POLYVINYL ALCOHOL 1.4 % OP SOLN: 1.0000 [drp] | Freq: Four times a day (QID) | OPHTHALMIC | Status: DC | PRN

## 2022-09-05 MED ORDER — ACETAMINOPHEN 650 MG RE SUPP: 650.0000 mg | Freq: Four times a day (QID) | RECTAL | Status: DC | PRN

## 2022-09-05 MED ORDER — MIDAZOLAM BOLUS VIA INFUSION (WITHDRAWAL LIFE SUSTAINING TX)
2.0000 mg | INTRAVENOUS | Status: DC | PRN
  Administered 2022-09-05 (×2): 2 mg via INTRAVENOUS

## 2022-09-05 MED ORDER — SODIUM CHLORIDE 0.9 % IV SOLN
4500.0000 mg | Freq: Once | INTRAVENOUS | Status: AC
  Administered 2022-09-05: 4500 mg via INTRAVENOUS
  Filled 2022-09-05: qty 45

## 2022-09-05 MED ORDER — POTASSIUM CHLORIDE 20 MEQ PO PACK
40.0000 meq | PACK | Freq: Once | ORAL | Status: AC
  Administered 2022-09-05: 40 meq
  Filled 2022-09-05: qty 2

## 2022-09-05 MED ORDER — MIDAZOLAM-SODIUM CHLORIDE 100-0.9 MG/100ML-% IV SOLN
0.0000 mg/h | INTRAVENOUS | Status: DC
  Administered 2022-09-05: 1 mg/h via INTRAVENOUS
  Filled 2022-09-05: qty 100

## 2022-09-05 MED ORDER — GLYCOPYRROLATE 0.2 MG/ML IJ SOLN
0.2000 mg | INTRAMUSCULAR | Status: DC | PRN
  Administered 2022-09-05: 0.2 mg via INTRAVENOUS

## 2022-09-05 MED ORDER — MORPHINE BOLUS VIA INFUSION
5.0000 mg | INTRAVENOUS | Status: DC | PRN
  Administered 2022-09-05: 5 mg via INTRAVENOUS
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
## 2022-09-05 NOTE — Progress Notes (Addendum)
Pt extubated to comfort care per order. 

## 2022-09-05 NOTE — Progress Notes (Signed)
  NEUROSURGERY PROGRESS NOTE   Pt seen and examined. EEG overnight indicative of status  EXAM: Temp:  [98.3 F (36.8 C)-99.6 F (37.6 C)] 98.3 F (36.8 C) (10/21 0800) Pulse Rate:  [68-94] 77 (10/21 0802) Resp:  [16-27] 16 (10/21 0802) BP: (108-152)/(66-97) 152/78 (10/21 0802) SpO2:  [94 %-99 %] 98 % (10/21 0803) FiO2 (%):  [40 %] 40 % (10/21 0803) Intake/Output      10/20 0701 10/21 0700 10/21 0701 10/22 0700   I.V. (mL/kg) 1937.8 (24.9) 86.9 (1.1)   NG/GT 995.6 65   IV Piggyback 187.2    Total Intake(mL/kg) 3120.6 (40.2) 151.9 (2)   Urine (mL/kg/hr) 2200 (1.2) 225 (1.3)   Blood     Total Output 2200 225   Net +920.6 -73.1         Intubated, off propofol: 28m, fixed non-reactive pupils OU Breathing spontaneously over vent Extensor posturing to central pain  LABS: Lab Results  Component Value Date   CREATININE 0.68 09/05/2022   BUN 32 (H) 09/05/2022   NA 146 (H) 09/05/2022   K 3.6 09/05/2022   CL 117 (H) 09/05/2022   CO2 20 (L) 09/05/2022   Lab Results  Component Value Date   WBC 13.7 (H) 09/05/2022   HGB 7.8 (L) 09/05/2022   HCT 26.1 (L) 09/05/2022   MCV 81.3 09/05/2022   PLT 87 (L) 09/05/2022    IMPRESSION: - 66y.o. female POD# 2 s/p repeat bur hole evac SDH with postop IPH and SZ. Exam remains poor, suspect overall prognosis for recovery is quite poor   PLAN: - Keppra dose increased this am, cont EEG monitoring - Cont supportive care, if no change in condition may discuss with family for transition to comfort care   NConsuella Lose MD [facility]

## 2022-09-05 NOTE — Progress Notes (Signed)
[facility] ADULT ICU REPLACEMENT PROTOCOL   The patient does apply for the [facility] Adult ICU Electrolyte Replacment Protocol based on the criteria listed below:   1.Exclusion criteria: TCTS patients, ECMO patients, and Dialysis patients 2. Is GFR >/= 30 ml/min? Yes.    Patient's GFR today is >60 3. Is SCr </= 2? Yes.   Patient's SCr is 0.68 mg/dL 4. Did SCr increase >/= 0.5 in 24 hours? No. 5.Pt's weight >40kg  Yes.   6. Abnormal electrolyte(s):   K 3.6  7. Electrolytes replaced per protocol 8.  Call MD STAT for K+ </= 2.5, Phos </= 1, or Mag </= 1 Physician:  E. Margy Clarks Feiga Nadel 09/05/2022 5:42 AM

## 2022-09-05 NOTE — Progress Notes (Signed)
   09/05/22 1745  Clinical Encounter Type  Visited With Other (Comment) (Received page from nurse in [place])  Visit Type Initial;Spiritual support;Patient actively dying  Referral From Nurse  Consult/Referral To Chaplain;Faith community  Recommendations Last rites requested by priest   In follow-up to call from Nurse [place], [place] contacted Father Randall Hiss to determine if/when he would be able to come to the hospital to bless the patient with last rites. Initially, he indicated that the earliest would be tomorrow after 12noon; however, I explained the family's desire to move forward with comfort care tonight and Father Randall Hiss stated that he would come within 45 minutes to [place], [place] Resident [phone]

## 2022-09-05 NOTE — Progress Notes (Signed)
LTM EEG discontinued - no skin breakdown at unhook.   

## 2022-09-05 NOTE — Progress Notes (Signed)
PCCM:  Met with patient's family at bedside. Patients family is interested in transition to comfort care. Critical care comfort care order set has placed. We will plan for palliative extubation Comfort is the only goal Medications for continuous morphine and Versed given.  Garner Nash, DO [facility] 09/05/2022 6:50 PM

## 2022-09-05 NOTE — Progress Notes (Signed)
EEG maint complete.  ?

## 2022-09-05 NOTE — Progress Notes (Signed)
[place] Progress Note Patient Name: Kristin Delgado DOB: 1956/10/07 MRN: [id number]   Date of Service  09/05/2022  HPI/Events of Note  RN reports pt CBG 192, has no SSI.  TF started today Creatinine 0.7 BMI 30  eICU Interventions  Ordered moderate dose SSI     Intervention Category Intermediate Interventions: Hyperglycemia - evaluation and treatment  Shona Needles Layton Tappan 09/05/2022, 12:53 AM

## 2022-09-05 NOTE — Procedures (Addendum)
Patient Name: Kristin Delgado  MRN: [id number]  Epilepsy Attending: Lora Havens  Referring Physician/Provider: Jacky Kindle, MD Duration: 09/04/2022 6387 to 09/05/2022 5643  Patient history: 66yo F with L SDH s/p evacuation, Right SAH, L frontal ICH on eeg to evaluate for seizure  Level of alertness:  comatose  AEDs during EEG study: LEV, propofol  Technical aspects: This EEG study was done with scalp electrodes positioned according to the 10-20 International system of electrode placement. Electrical activity was reviewed with band pass filter of 1-'70Hz'$ , sensitivity of 7 uV/mm, display speed of 54m/sec with a '60Hz'$  notched filter applied as appropriate. EEG data were recorded continuously and digitally stored.  Video monitoring was available and reviewed as appropriate.  Description: No posterior dominant rhythm was seen. Sleep was characterized by sleep spindles (12-'14hz'$ ), maximal frontocentral region. EEG showed continuous generalized and lateralized left hemisphere 3 to 6 Hz theta-delta slowing. EEG also showed generalized and lateralized right hemisphere periodic discharges with triphasic morphology at 1 Hz, predominantly when awake/stimulated. Hyperventilation and photic stimulation were not performed.     After 2030 on 09/04/2022, patient was noted to have seizures without clinical signs arising from vertex region. EEG showed 2-'3Hz'$  rhythmic delta slowing in vertex which then evolved into 4-'5hz'$  theta sowing and at time involved left frontal region followed by spikes and wave in vertex region.  At th beginning 1-2 seizures were noted per hour which gradually increased to average 6 seizure/hour, lasting about 2-3 minutes,  ABNORMALITY - Seizure without clinical signs, vertex region - Periodic discharges with triphasic morphology, generalized and lateralized right hemisphere( GPDs) - Continuous slow, generalized and lateralized left hemisphere  IMPRESSION: This study showed seizures  without clinical signs arising from vertex region. Seizures started around 2030 on 09/04/2022 and initially 1-2 seizures were noted per hour which gradually increased to 6 seizures/hour on average and lasting about 2-3 minutes each.  This study is also suggestive of cortical dysfunction arising from left hemisphere likely secondary to underlying ICH. Additionally there is  severe diffuse encephalopathy, nonspecific etiology.   Periodic discharges with triphasic morphology were noted predominantly in right hemisphere when awake/stimulated which can be on the ictal-interictal continuum. However, morphology, frequency and reactivity to stimulation is more commonly indicative of  toxic-metabolic encephalopathy.   Dr IValeta Harmswas notified.  Khadija Thier OBarbra Sarks

## 2022-09-06 DIAGNOSIS — R569 Unspecified convulsions: Secondary | ICD-10-CM | POA: Diagnosis not present

## 2022-09-16 NOTE — Procedures (Signed)
Patient Name: Kristin Delgado  MRN: [id number]  Epilepsy Attending: Lora Havens  Referring Physician/Provider: Jacky Kindle, MD Duration: 09/05/2022 0918 to 09/05/2022 1906   Patient history: 66yo F with L SDH s/p evacuation, Right SAH, L frontal ICH on eeg to evaluate for seizure   Level of alertness:  comatose   AEDs during EEG study: LEV, propofol   Technical aspects: This EEG study was done with scalp electrodes positioned according to the 10-20 International system of electrode placement. Electrical activity was reviewed with band pass filter of 1-'70Hz'$ , sensitivity of 7 uV/mm, display speed of 22m/sec with a '60Hz'$  notched filter applied as appropriate. EEG data were recorded continuously and digitally stored.  Video monitoring was available and reviewed as appropriate.   Description: No posterior dominant rhythm was seen. Sleep was characterized by sleep spindles (12-'14hz'$ ), maximal frontocentral region. EEG showed continuous generalized and lateralized left hemisphere 3 to 6 Hz theta-delta slowing. EEG also showed generalized and lateralized right hemisphere periodic discharges with triphasic morphology at 1 Hz, predominantly when awake/stimulated. Hyperventilation and photic stimulation were not performed.      At th beginning average 6 seizure without clinical signs/hour, lasting about 2-3 minute were noted arising from vertex region. As medications were adjusted, frequency of seizures improved. Last seizure was noted on 09/05/2022 at 1708   ABNORMALITY - Seizure without clinical signs, vertex region - Periodic discharges with triphasic morphology, generalized and lateralized right hemisphere( GPDs) - Continuous slow, generalized and lateralized left hemisphere   IMPRESSION: This study showed seizures without clinical signs arising from vertex region. Initially 6 seizures/hour were noted on average and lasting about 2-3 minutes each. s medications were adjusted, frequency of seizures  improved. Last seizure was noted on 09/05/2022 at 1708.   This study is also suggestive of cortical dysfunction arising from left hemisphere likely secondary to underlying ICH. Additionally there is  severe diffuse encephalopathy, nonspecific etiology.    Periodic discharges with triphasic morphology were noted predominantly in right hemisphere when awake/stimulated which can be on the ictal-interictal continuum. However, morphology, frequency and reactivity to stimulation is more commonly indicative of  toxic-metabolic encephalopathy.    Tomeika Weinmann OBarbra Sarks

## 2022-09-16 NOTE — Death Summary Note (Signed)
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 from the base of the skull through the vertex without intravenous contrast. RADIATION DOSE REDUCTION: This exam was performed according to the departmental dose-optimization program which includes automated exposure control, adjustment of the mA and/or kV according to patient size and/or use of iterative reconstruction technique. COMPARISON:  Brain MRI 03/12/2011. FINDINGS: Brain: Large volume hypodense left side subdural hematoma, maxilla mall along the left anterior frontal convexity and up to 26 mm thickness there (series 5, image 20 and series 3, image 18. There is a left para falcine component also, with a small volume of layering hyperdense blood there. Intracranial mass effect with rightward midline shift up to 18 mm. Trapped right lateral ventricle with mild temporal horn enlargement and transependymal edema. Subfalcine and left uncal herniation (series 5, image 33). Effaced suprasellar and interpeduncular cisterns. No tonsillar herniation, cisterna magna is patent. No other cerebral edema. No cortically based acute infarct identified. No other intracranial hemorrhage. Vascular: No suspicious intracranial vascular hyperdensity. Skull: Previous left  mastoidectomy and mastoid cranioplasty, new since 2012. There is a left occipital bone anchor in place on series 4, image 24. No acute osseous abnormality  identified. Sinuses/Orbits: Previous mastoidectomy and mastoid cranioplasty. Partially opacified mastoid cavity with fluid. Maxillary sinus mucous retention cysts and moderate sphenoid sinus mucoperiosteal thickening. Similar mucoperiosteal thickening at the left frontoethmoidal recess. Other: No acute orbit or scalp soft tissue finding. Intubated on the scout view. IMPRESSION: 1. Large volume low-density Left Side Subdural Hematoma up to 26 mm thickness. 2. Severe intracranial mass effect with rightward midline shift up to 18 mm, subfalcine and left uncal herniation. Trapped right lateral ventricle with some transependymal edema. 3. No associated acute infarct identified. 4. Previous left mastoidectomy and mastoid cranioplasty, and nearby left occipital bone anchor in place. 5. Paranasal sinus inflammation. Electronically Signed: By: Genevie Ann M.D. On: 08/28/2022 12:12   CT Cervical Spine Wo Contrast  Result Date: 08/29/2022 CLINICAL DATA:  66 year old female with increasing lethargy, currently unresponsive and blown pupil. Large left side subdural hematoma. EXAM: CT CERVICAL SPINE WITHOUT CONTRAST TECHNIQUE: Multidetector CT imaging of the cervical spine was performed without intravenous contrast. Multiplanar CT image reconstructions were also generated. RADIATION DOSE REDUCTION: This exam was performed according to the departmental dose-optimization program which includes automated exposure control, adjustment of the mA and/or kV according to patient size and/or use of iterative reconstruction technique. COMPARISON:  CT head and face today. FINDINGS: Alignment: Straightening of cervical lordosis. Cervicothoracic junction alignment is within normal limits. Bilateral posterior element alignment is within normal limits. Skull base and vertebrae: Partially  visible left mastoidectomy in cranioplasty, detailed separately. Elsewhere the visible skull base appears intact. C1 and C2 appear intact and aligned. No acute osseous abnormality identified. Soft tissues and spinal canal: No prevertebral fluid or swelling. No visible canal hematoma. Intubated, and the ET tube courses appropriately into the subglottic trachea. Fluid in the pharynx. Disc levels: Lower cervical bulky disc and endplate degeneration G2-R4 and C6-C7. Associated mild cervical spinal stenosis. Upper chest: Mild dependent atelectasis in the lung apices. Endotracheal tube within the subglottic trachea. Grossly intact visible upper thoracic levels. IMPRESSION: 1. No acute traumatic injury identified in the cervical spine. Lower cervical spine disc and endplate degeneration with suspected mild cervical spinal stenosis. 2. Intubated, and ET tube courses appropriately into the trachea. Electronically Signed   By: Genevie Ann M.D.   On: 09/01/2022 12:24   CT Maxillofacial Wo Contrast  Result Date: 08/26/2022 CLINICAL DATA:  66 year old female with increasing lethargy, currently unresponsive and blown pupil. Large left side subdural hematoma. EXAM: CT MAXILLOFACIAL WITHOUT CONTRAST TECHNIQUE: Multidetector CT imaging of the maxillofacial structures was performed. Multiplanar CT image reconstructions were also generated. RADIATION DOSE REDUCTION: This exam was performed according to the departmental dose-optimization program which includes automated exposure control, adjustment of the mA and/or kV according to patient size and/or use of iterative reconstruction technique. COMPARISON:  CT head and cervical spine today reported separately. FINDINGS: Osseous: Bilateral TMJ degeneration. Mandible intact and located. Maxilla, zygoma, pterygoid, and nasal bones intact. Central skull base intact, with previous left mastoidectomy and cranioplasty there, further described on head CT today. Orbits: Intact orbital walls.  Orbits soft tissues appears symmetric and negative. Sinuses: Previous left mastoidectomy with partially opacified surgical cavity with fluid and bubbly opacity. Left tympanic portion of the cavity remains relatively well aerated. Right tympanic cavity and mastoids are clear. Moderate left frontoethmoidal and bilateral sphenoid mucoperiosteal thickening. Bilateral maxillary sinus retention cysts. Soft tissues: Intubated. Endotracheal tube courses appropriately toward the airway at the larynx. Fluid in the pharynx. Otherwise negative visible noncontrast deep soft tissue spaces of the face. Limited intracranial: Intracranial mass  effect from left side extra-axial collection is reported separately today. IMPRESSION: 1. No acute facial fracture identified. 2. Intubated with satisfactory visible endotracheal tube. 3. Chronic appearing paranasal sinus disease. Previous left mastoidectomy and cranioplasty. Electronically Signed   By: Genevie Ann M.D.   On: 08/28/2022 12:17   DG Chest 2 View  Result Date: 08/23/2022 CLINICAL DATA:  Short of breath. Shallow breathing and dry cough for a few days. EXAM: CHEST - 2 VIEW COMPARISON:  11/25/2019. FINDINGS: Cardiac silhouette is normal in size. No mediastinal or hilar masses. No evidence of adenopathy. Minor linear scarring or atelectasis at the left lung base. Lungs otherwise clear. No pleural effusion or pneumothorax. Skeletal structures are intact. IMPRESSION: No active cardiopulmonary disease. Electronically Signed   By: Lajean Manes M.D.   On: 08/23/2022 14:16    Microbiology Recent Results (from the past 240 hour(s))  MRSA Next Gen by PCR, Nasal     Status: None   Collection Time: 09/13/2022  3:49 PM   Specimen: Nasal Mucosa; Nasal Swab  Result Value Ref Range Status   MRSA by PCR Next Gen NOT DETECTED NOT DETECTED Final    Comment: (NOTE) The GeneXpert MRSA Assay (FDA approved for NASAL specimens only), is one component of a comprehensive MRSA colonization  surveillance program. It is not intended to diagnose MRSA infection nor to guide or monitor treatment for MRSA infections. Test performance is not FDA approved in patients less than 62 years old. Performed at [facility], [place] [address] [place], [place] [address]   Culture, blood (Routine x 2)     Status: None   Collection Time: 08/25/2022  5:07 PM   Specimen: BLOOD  Result Value Ref Range Status   Specimen Description BLOOD SITE NOT SPECIFIED  Final   Special Requests   Final    BOTTLES DRAWN AEROBIC AND ANAEROBIC Blood Culture results may not be optimal due to an excessive volume of blood received in culture bottles   Culture   Final    NO GROWTH 5 DAYS Performed at [facility], [place] [address] [place], [place] [address]    Report Status 09/04/2022 FINAL  Final  Culture, blood (Routine x 2)     Status: None   Collection Time: 09/09/2022  5:56 PM   Specimen: BLOOD RIGHT HAND  Result Value Ref Range Status   Specimen Description BLOOD RIGHT HAND  Final   Special Requests   Final    BOTTLES DRAWN AEROBIC ONLY Blood Culture adequate volume   Culture   Final    NO GROWTH 5 DAYS Performed at [facility], [place] [address] [place], [place] [address]    Report Status 09/05/2022 FINAL  Final    Lab Basic Metabolic Panel: Recent Labs  Lab 09/02/22 0411 08/16/2022 0457 08/16/2022 1720 08/17/2022 1807 09/04/22 0511 09/05/22 0330  NA 142 138 140 137 140 146*  K 3.7 3.5 3.7 3.7 4.0 3.6  CL 103 99 103  --  106 117*  CO2 '26 24 26  '$ --  21* 20*  GLUCOSE 113* 119* 125*  --  131* 176*  BUN '15 14 16  '$ --  22 32*  CREATININE 0.41* 0.55 0.66  --  0.70 0.68  CALCIUM 9.9 9.4 8.8*  --  8.5* 8.6*  MG 1.8  --  1.9  --  2.0 2.3  PHOS 3.7  --  5.9*  --  4.2 2.7   Liver Function Tests: Recent Labs  Lab  08/29/2022 1720 09/05/22 0330  AST 46* 50*  ALT 29 34  ALKPHOS 133* 134*  BILITOT 0.5 0.5  PROT 5.3* 5.1*  ALBUMIN 2.2* 2.1*   No results for input(s): "LIPASE", "AMYLASE"  in the last 168 hours. No results for input(s): "AMMONIA" in the last 168 hours. CBC: Recent Labs  Lab 09/02/22 0411 09/08/2022 0457 08/20/2022 1720 08/18/2022 1807 09/04/22 0511 09/05/22 0330  WBC 11.7* 12.5* 12.4*  --  12.9* 13.7*  HGB 9.0* 9.9* 8.8* 8.2* 7.8* 7.8*  HCT 27.8* 31.8* 27.8* 24.0* 24.1* 26.1*  MCV 77.4* 78.9* 79.4*  --  78.8* 81.3  PLT 77* 97* 92*  --  90* 87*   Cardiac Enzymes: No results for input(s): "CKTOTAL", "CKMB", "CKMBINDEX", "TROPONINI" in the last 168 hours. Sepsis Labs: Recent Labs  Lab 08/29/2022 0457 09/02/2022 1720 09/02/2022 2017 09/04/22 0511 09/05/22 0330  WBC 12.5* 12.4*  --  12.9* 13.7*  LATICACIDVEN  --  1.6 1.9  --   --     Procedures/Operations   NEuroSx intervention  ETT EEG   Kristin Delgado 09/08/2022, 3:47 PM

## 2022-09-16 NOTE — Progress Notes (Signed)
[facility] Progress Note Patient Name: Kristin Delgado DOB: 1956/07/10 MRN: [id number]   Date of Service  10-06-22  HPI/Events of Note  Patient on only comfort care and RN hopeful to make an ICU bed. Is on IV morphine and family was notified that patient will be moving into another room.   eICU Interventions  Transfer order being placed by CCM team.      Intervention Category Major Interventions: Respiratory failure - evaluation and management  Margaretmary Lombard 10-06-22, 12:51 AM

## 2022-09-16 DEATH — deceased

## 2022-10-22 ENCOUNTER — Other Ambulatory Visit (INDEPENDENT_AMBULATORY_CARE_PROVIDER_SITE_OTHER): Payer: Self-pay | Admitting: Family Medicine

## 2022-10-22 DIAGNOSIS — E7849 Other hyperlipidemia: Secondary | ICD-10-CM

## 2022-10-27 ENCOUNTER — Ambulatory Visit (INDEPENDENT_AMBULATORY_CARE_PROVIDER_SITE_OTHER): Payer: Medicare Other | Admitting: Family Medicine
